# Patient Record
Sex: Male | Born: 1935 | Race: White | Hispanic: No | Marital: Married | Smoking: Former smoker
Health system: Southern US, Community
[De-identification: ages and names within clinical notes are randomized; demographics above are authoritative.]

## PROBLEM LIST (undated history)

## (undated) DIAGNOSIS — I4819 Other persistent atrial fibrillation: Secondary | ICD-10-CM

## (undated) DIAGNOSIS — E079 Disorder of thyroid, unspecified: Secondary | ICD-10-CM

## (undated) DIAGNOSIS — Z95810 Presence of automatic (implantable) cardiac defibrillator: Secondary | ICD-10-CM

## (undated) DIAGNOSIS — I25119 Atherosclerotic heart disease of native coronary artery with unspecified angina pectoris: Secondary | ICD-10-CM

## (undated) DIAGNOSIS — J449 Chronic obstructive pulmonary disease, unspecified: Secondary | ICD-10-CM

## (undated) DIAGNOSIS — I5042 Chronic combined systolic (congestive) and diastolic (congestive) heart failure: Secondary | ICD-10-CM

## (undated) DIAGNOSIS — N183 Chronic kidney disease, stage 3 (moderate): Secondary | ICD-10-CM

## (undated) DIAGNOSIS — I2721 Secondary pulmonary arterial hypertension: Secondary | ICD-10-CM

## (undated) DIAGNOSIS — E785 Hyperlipidemia, unspecified: Secondary | ICD-10-CM

## (undated) DIAGNOSIS — I671 Cerebral aneurysm, nonruptured: Secondary | ICD-10-CM

## (undated) DIAGNOSIS — I350 Nonrheumatic aortic (valve) stenosis: Secondary | ICD-10-CM

## (undated) DIAGNOSIS — I10 Essential (primary) hypertension: Secondary | ICD-10-CM

## (undated) HISTORY — PX: PROSTATE SURGERY: SHX751

## (undated) HISTORY — DX: Hyperlipidemia, unspecified: E78.5

## (undated) HISTORY — PX: CORONARY ANGIOPLASTY WITH STENT PLACEMENT: SHX49

## (undated) HISTORY — DX: Cerebral aneurysm, nonruptured: I67.1

## (undated) HISTORY — PX: APPENDECTOMY: SHX54

## (undated) HISTORY — DX: Chronic obstructive pulmonary disease, unspecified: J44.9

## (undated) HISTORY — PX: SHOULDER SURGERY: SHX246

## (undated) HISTORY — DX: Nonrheumatic aortic (valve) stenosis: I35.0

## (undated) HISTORY — DX: Essential (primary) hypertension: I10

## (undated) HISTORY — PX: CARDIAC SURGERY: SHX584

## (undated) HISTORY — DX: Secondary pulmonary arterial hypertension: I27.21

## (undated) HISTORY — DX: Presence of automatic (implantable) cardiac defibrillator: Z95.810

## (undated) HISTORY — DX: Atherosclerotic heart disease of native coronary artery with unspecified angina pectoris: I25.119

## (undated) HISTORY — DX: Disorder of thyroid, unspecified: E07.9

## (undated) HISTORY — DX: Chronic combined systolic (congestive) and diastolic (congestive) heart failure: I50.42

---

## 1997-02-11 HISTORY — PX: KNEE SURGERY: SHX244

## 2006-02-11 DIAGNOSIS — Z95810 Presence of automatic (implantable) cardiac defibrillator: Secondary | ICD-10-CM | POA: Insufficient documentation

## 2006-02-11 HISTORY — DX: Presence of automatic (implantable) cardiac defibrillator: Z95.810

## 2011-02-12 HISTORY — PX: VEIN BYPASS SURGERY: SHX833

## 2011-02-12 HISTORY — PX: CORONARY ARTERY BYPASS GRAFT: SHX141

## 2012-08-26 DIAGNOSIS — I25709 Atherosclerosis of coronary artery bypass graft(s), unspecified, with unspecified angina pectoris: Secondary | ICD-10-CM

## 2012-09-05 HISTORY — PX: COLONOSCOPY: SHX174

## 2012-09-13 DIAGNOSIS — Z95810 Presence of automatic (implantable) cardiac defibrillator: Secondary | ICD-10-CM | POA: Insufficient documentation

## 2015-02-12 HISTORY — PX: CARPAL TUNNEL RELEASE: SHX101

## 2015-02-28 DIAGNOSIS — I480 Paroxysmal atrial fibrillation: Secondary | ICD-10-CM | POA: Diagnosis not present

## 2015-02-28 DIAGNOSIS — R42 Dizziness and giddiness: Secondary | ICD-10-CM | POA: Diagnosis not present

## 2015-02-28 DIAGNOSIS — E7800 Pure hypercholesterolemia, unspecified: Secondary | ICD-10-CM | POA: Diagnosis not present

## 2015-02-28 DIAGNOSIS — E039 Hypothyroidism, unspecified: Secondary | ICD-10-CM | POA: Diagnosis not present

## 2015-02-28 DIAGNOSIS — I2510 Atherosclerotic heart disease of native coronary artery without angina pectoris: Secondary | ICD-10-CM | POA: Diagnosis not present

## 2015-02-28 DIAGNOSIS — Z7901 Long term (current) use of anticoagulants: Secondary | ICD-10-CM | POA: Diagnosis not present

## 2015-02-28 DIAGNOSIS — R350 Frequency of micturition: Secondary | ICD-10-CM | POA: Diagnosis not present

## 2015-02-28 DIAGNOSIS — R7301 Impaired fasting glucose: Secondary | ICD-10-CM | POA: Diagnosis not present

## 2015-02-28 DIAGNOSIS — R5381 Other malaise: Secondary | ICD-10-CM | POA: Diagnosis not present

## 2015-03-06 DIAGNOSIS — N400 Enlarged prostate without lower urinary tract symptoms: Secondary | ICD-10-CM | POA: Diagnosis not present

## 2015-03-06 DIAGNOSIS — R972 Elevated prostate specific antigen [PSA]: Secondary | ICD-10-CM | POA: Diagnosis not present

## 2015-03-15 DIAGNOSIS — I700 Atherosclerosis of aorta: Secondary | ICD-10-CM | POA: Diagnosis not present

## 2015-03-15 DIAGNOSIS — Z96653 Presence of artificial knee joint, bilateral: Secondary | ICD-10-CM | POA: Diagnosis not present

## 2015-03-15 DIAGNOSIS — K769 Liver disease, unspecified: Secondary | ICD-10-CM | POA: Diagnosis not present

## 2015-03-15 DIAGNOSIS — N281 Cyst of kidney, acquired: Secondary | ICD-10-CM | POA: Diagnosis not present

## 2015-03-15 DIAGNOSIS — I2510 Atherosclerotic heart disease of native coronary artery without angina pectoris: Secondary | ICD-10-CM | POA: Diagnosis not present

## 2015-03-15 DIAGNOSIS — Z8546 Personal history of malignant neoplasm of prostate: Secondary | ICD-10-CM | POA: Diagnosis not present

## 2015-03-15 DIAGNOSIS — Z006 Encounter for examination for normal comparison and control in clinical research program: Secondary | ICD-10-CM | POA: Diagnosis not present

## 2015-03-15 DIAGNOSIS — C61 Malignant neoplasm of prostate: Secondary | ICD-10-CM | POA: Diagnosis not present

## 2015-03-15 DIAGNOSIS — S2241XA Multiple fractures of ribs, right side, initial encounter for closed fracture: Secondary | ICD-10-CM | POA: Diagnosis not present

## 2015-03-15 DIAGNOSIS — E041 Nontoxic single thyroid nodule: Secondary | ICD-10-CM | POA: Diagnosis not present

## 2015-03-15 DIAGNOSIS — Z951 Presence of aortocoronary bypass graft: Secondary | ICD-10-CM | POA: Diagnosis not present

## 2015-03-15 DIAGNOSIS — K8020 Calculus of gallbladder without cholecystitis without obstruction: Secondary | ICD-10-CM | POA: Diagnosis not present

## 2015-03-21 DIAGNOSIS — Z7901 Long term (current) use of anticoagulants: Secondary | ICD-10-CM | POA: Diagnosis not present

## 2015-03-21 DIAGNOSIS — I480 Paroxysmal atrial fibrillation: Secondary | ICD-10-CM | POA: Diagnosis not present

## 2015-04-03 DIAGNOSIS — Z7901 Long term (current) use of anticoagulants: Secondary | ICD-10-CM | POA: Diagnosis not present

## 2015-04-03 DIAGNOSIS — Z95810 Presence of automatic (implantable) cardiac defibrillator: Secondary | ICD-10-CM | POA: Diagnosis not present

## 2015-04-03 DIAGNOSIS — Z951 Presence of aortocoronary bypass graft: Secondary | ICD-10-CM | POA: Diagnosis not present

## 2015-04-03 DIAGNOSIS — R42 Dizziness and giddiness: Secondary | ICD-10-CM | POA: Diagnosis not present

## 2015-04-03 DIAGNOSIS — I480 Paroxysmal atrial fibrillation: Secondary | ICD-10-CM | POA: Diagnosis not present

## 2015-04-05 DIAGNOSIS — R42 Dizziness and giddiness: Secondary | ICD-10-CM | POA: Diagnosis not present

## 2015-04-12 HISTORY — PX: NM MYOVIEW LTD: HXRAD82

## 2015-04-25 DIAGNOSIS — I2510 Atherosclerotic heart disease of native coronary artery without angina pectoris: Secondary | ICD-10-CM | POA: Diagnosis not present

## 2015-04-25 DIAGNOSIS — R0602 Shortness of breath: Secondary | ICD-10-CM | POA: Diagnosis not present

## 2015-04-25 DIAGNOSIS — Z7901 Long term (current) use of anticoagulants: Secondary | ICD-10-CM | POA: Diagnosis not present

## 2015-04-25 DIAGNOSIS — Z951 Presence of aortocoronary bypass graft: Secondary | ICD-10-CM | POA: Diagnosis not present

## 2015-04-25 DIAGNOSIS — I480 Paroxysmal atrial fibrillation: Secondary | ICD-10-CM | POA: Diagnosis not present

## 2015-04-25 DIAGNOSIS — E782 Mixed hyperlipidemia: Secondary | ICD-10-CM | POA: Diagnosis not present

## 2015-04-25 DIAGNOSIS — I482 Chronic atrial fibrillation: Secondary | ICD-10-CM | POA: Diagnosis not present

## 2015-04-25 DIAGNOSIS — Z950 Presence of cardiac pacemaker: Secondary | ICD-10-CM | POA: Diagnosis not present

## 2015-05-03 DIAGNOSIS — D1801 Hemangioma of skin and subcutaneous tissue: Secondary | ICD-10-CM | POA: Diagnosis not present

## 2015-05-03 DIAGNOSIS — L821 Other seborrheic keratosis: Secondary | ICD-10-CM | POA: Diagnosis not present

## 2015-05-03 DIAGNOSIS — C44311 Basal cell carcinoma of skin of nose: Secondary | ICD-10-CM | POA: Diagnosis not present

## 2015-05-03 DIAGNOSIS — D225 Melanocytic nevi of trunk: Secondary | ICD-10-CM | POA: Diagnosis not present

## 2015-05-08 DIAGNOSIS — I2510 Atherosclerotic heart disease of native coronary artery without angina pectoris: Secondary | ICD-10-CM | POA: Diagnosis not present

## 2015-05-08 DIAGNOSIS — R9430 Abnormal result of cardiovascular function study, unspecified: Secondary | ICD-10-CM | POA: Diagnosis not present

## 2015-05-16 DIAGNOSIS — I480 Paroxysmal atrial fibrillation: Secondary | ICD-10-CM | POA: Diagnosis not present

## 2015-05-16 DIAGNOSIS — Z7901 Long term (current) use of anticoagulants: Secondary | ICD-10-CM | POA: Diagnosis not present

## 2015-05-17 DIAGNOSIS — R972 Elevated prostate specific antigen [PSA]: Secondary | ICD-10-CM | POA: Diagnosis not present

## 2015-05-22 DIAGNOSIS — R279 Unspecified lack of coordination: Secondary | ICD-10-CM | POA: Diagnosis not present

## 2015-05-22 DIAGNOSIS — R262 Difficulty in walking, not elsewhere classified: Secondary | ICD-10-CM | POA: Diagnosis not present

## 2015-05-22 DIAGNOSIS — M6281 Muscle weakness (generalized): Secondary | ICD-10-CM | POA: Diagnosis not present

## 2015-05-29 DIAGNOSIS — R972 Elevated prostate specific antigen [PSA]: Secondary | ICD-10-CM | POA: Diagnosis not present

## 2015-05-29 DIAGNOSIS — M6281 Muscle weakness (generalized): Secondary | ICD-10-CM | POA: Diagnosis not present

## 2015-05-29 DIAGNOSIS — R262 Difficulty in walking, not elsewhere classified: Secondary | ICD-10-CM | POA: Diagnosis not present

## 2015-05-29 DIAGNOSIS — R279 Unspecified lack of coordination: Secondary | ICD-10-CM | POA: Diagnosis not present

## 2015-05-29 DIAGNOSIS — C61 Malignant neoplasm of prostate: Secondary | ICD-10-CM | POA: Diagnosis not present

## 2015-05-29 DIAGNOSIS — N400 Enlarged prostate without lower urinary tract symptoms: Secondary | ICD-10-CM | POA: Diagnosis not present

## 2015-06-01 DIAGNOSIS — Z125 Encounter for screening for malignant neoplasm of prostate: Secondary | ICD-10-CM | POA: Diagnosis not present

## 2015-06-01 DIAGNOSIS — Z0000 Encounter for general adult medical examination without abnormal findings: Secondary | ICD-10-CM | POA: Diagnosis not present

## 2015-06-01 DIAGNOSIS — E039 Hypothyroidism, unspecified: Secondary | ICD-10-CM | POA: Diagnosis not present

## 2015-06-01 DIAGNOSIS — E1165 Type 2 diabetes mellitus with hyperglycemia: Secondary | ICD-10-CM | POA: Diagnosis not present

## 2015-06-01 DIAGNOSIS — R279 Unspecified lack of coordination: Secondary | ICD-10-CM | POA: Diagnosis not present

## 2015-06-01 DIAGNOSIS — M6281 Muscle weakness (generalized): Secondary | ICD-10-CM | POA: Diagnosis not present

## 2015-06-01 DIAGNOSIS — R262 Difficulty in walking, not elsewhere classified: Secondary | ICD-10-CM | POA: Diagnosis not present

## 2015-06-05 DIAGNOSIS — R262 Difficulty in walking, not elsewhere classified: Secondary | ICD-10-CM | POA: Diagnosis not present

## 2015-06-05 DIAGNOSIS — M6281 Muscle weakness (generalized): Secondary | ICD-10-CM | POA: Diagnosis not present

## 2015-06-05 DIAGNOSIS — R279 Unspecified lack of coordination: Secondary | ICD-10-CM | POA: Diagnosis not present

## 2015-06-07 DIAGNOSIS — R262 Difficulty in walking, not elsewhere classified: Secondary | ICD-10-CM | POA: Diagnosis not present

## 2015-06-07 DIAGNOSIS — R972 Elevated prostate specific antigen [PSA]: Secondary | ICD-10-CM | POA: Diagnosis not present

## 2015-06-07 DIAGNOSIS — M6281 Muscle weakness (generalized): Secondary | ICD-10-CM | POA: Diagnosis not present

## 2015-06-07 DIAGNOSIS — R279 Unspecified lack of coordination: Secondary | ICD-10-CM | POA: Diagnosis not present

## 2015-06-07 DIAGNOSIS — Z0000 Encounter for general adult medical examination without abnormal findings: Secondary | ICD-10-CM | POA: Diagnosis not present

## 2015-06-12 HISTORY — PX: CARDIAC CATHETERIZATION: SHX172

## 2015-06-14 DIAGNOSIS — I480 Paroxysmal atrial fibrillation: Secondary | ICD-10-CM | POA: Diagnosis not present

## 2015-06-14 DIAGNOSIS — Z951 Presence of aortocoronary bypass graft: Secondary | ICD-10-CM | POA: Diagnosis not present

## 2015-06-14 DIAGNOSIS — I2510 Atherosclerotic heart disease of native coronary artery without angina pectoris: Secondary | ICD-10-CM | POA: Diagnosis not present

## 2015-06-14 DIAGNOSIS — Z7901 Long term (current) use of anticoagulants: Secondary | ICD-10-CM | POA: Diagnosis not present

## 2015-06-15 DIAGNOSIS — R262 Difficulty in walking, not elsewhere classified: Secondary | ICD-10-CM | POA: Diagnosis not present

## 2015-06-15 DIAGNOSIS — M6281 Muscle weakness (generalized): Secondary | ICD-10-CM | POA: Diagnosis not present

## 2015-06-15 DIAGNOSIS — R279 Unspecified lack of coordination: Secondary | ICD-10-CM | POA: Diagnosis not present

## 2015-06-19 DIAGNOSIS — M6281 Muscle weakness (generalized): Secondary | ICD-10-CM | POA: Diagnosis not present

## 2015-06-19 DIAGNOSIS — R279 Unspecified lack of coordination: Secondary | ICD-10-CM | POA: Diagnosis not present

## 2015-06-19 DIAGNOSIS — R262 Difficulty in walking, not elsewhere classified: Secondary | ICD-10-CM | POA: Diagnosis not present

## 2015-06-22 DIAGNOSIS — R279 Unspecified lack of coordination: Secondary | ICD-10-CM | POA: Diagnosis not present

## 2015-06-22 DIAGNOSIS — R262 Difficulty in walking, not elsewhere classified: Secondary | ICD-10-CM | POA: Diagnosis not present

## 2015-06-22 DIAGNOSIS — M6281 Muscle weakness (generalized): Secondary | ICD-10-CM | POA: Diagnosis not present

## 2015-06-26 DIAGNOSIS — G4440 Drug-induced headache, not elsewhere classified, not intractable: Secondary | ICD-10-CM | POA: Diagnosis not present

## 2015-06-26 DIAGNOSIS — I25718 Atherosclerosis of autologous vein coronary artery bypass graft(s) with other forms of angina pectoris: Secondary | ICD-10-CM | POA: Diagnosis not present

## 2015-06-26 DIAGNOSIS — I252 Old myocardial infarction: Secondary | ICD-10-CM | POA: Diagnosis not present

## 2015-06-26 DIAGNOSIS — I509 Heart failure, unspecified: Secondary | ICD-10-CM | POA: Diagnosis not present

## 2015-06-26 DIAGNOSIS — Z888 Allergy status to other drugs, medicaments and biological substances status: Secondary | ICD-10-CM | POA: Diagnosis not present

## 2015-06-26 DIAGNOSIS — Z9049 Acquired absence of other specified parts of digestive tract: Secondary | ICD-10-CM | POA: Diagnosis not present

## 2015-06-26 DIAGNOSIS — M1990 Unspecified osteoarthritis, unspecified site: Secondary | ICD-10-CM | POA: Diagnosis not present

## 2015-06-26 DIAGNOSIS — Z7982 Long term (current) use of aspirin: Secondary | ICD-10-CM | POA: Diagnosis not present

## 2015-06-26 DIAGNOSIS — E782 Mixed hyperlipidemia: Secondary | ICD-10-CM | POA: Diagnosis not present

## 2015-06-26 DIAGNOSIS — Z95810 Presence of automatic (implantable) cardiac defibrillator: Secondary | ICD-10-CM | POA: Diagnosis not present

## 2015-06-26 DIAGNOSIS — I480 Paroxysmal atrial fibrillation: Secondary | ICD-10-CM | POA: Diagnosis not present

## 2015-06-26 DIAGNOSIS — E7800 Pure hypercholesterolemia, unspecified: Secondary | ICD-10-CM | POA: Diagnosis not present

## 2015-06-26 DIAGNOSIS — R0789 Other chest pain: Secondary | ICD-10-CM | POA: Diagnosis not present

## 2015-06-26 DIAGNOSIS — T463X5A Adverse effect of coronary vasodilators, initial encounter: Secondary | ICD-10-CM | POA: Diagnosis not present

## 2015-06-26 DIAGNOSIS — Z87442 Personal history of urinary calculi: Secondary | ICD-10-CM | POA: Diagnosis not present

## 2015-06-26 DIAGNOSIS — Z96653 Presence of artificial knee joint, bilateral: Secondary | ICD-10-CM | POA: Diagnosis not present

## 2015-06-26 DIAGNOSIS — H269 Unspecified cataract: Secondary | ICD-10-CM | POA: Diagnosis not present

## 2015-06-26 DIAGNOSIS — I110 Hypertensive heart disease with heart failure: Secondary | ICD-10-CM | POA: Diagnosis not present

## 2015-06-26 DIAGNOSIS — Z7901 Long term (current) use of anticoagulants: Secondary | ICD-10-CM | POA: Diagnosis not present

## 2015-06-26 DIAGNOSIS — I959 Hypotension, unspecified: Secondary | ICD-10-CM | POA: Diagnosis not present

## 2015-06-26 DIAGNOSIS — R0602 Shortness of breath: Secondary | ICD-10-CM | POA: Diagnosis not present

## 2015-06-26 DIAGNOSIS — I25118 Atherosclerotic heart disease of native coronary artery with other forms of angina pectoris: Secondary | ICD-10-CM | POA: Diagnosis not present

## 2015-06-26 DIAGNOSIS — Z79899 Other long term (current) drug therapy: Secondary | ICD-10-CM | POA: Diagnosis not present

## 2015-06-26 DIAGNOSIS — Z9852 Vasectomy status: Secondary | ICD-10-CM | POA: Diagnosis not present

## 2015-06-26 DIAGNOSIS — I2582 Chronic total occlusion of coronary artery: Secondary | ICD-10-CM | POA: Diagnosis not present

## 2015-06-30 DIAGNOSIS — R279 Unspecified lack of coordination: Secondary | ICD-10-CM | POA: Diagnosis not present

## 2015-06-30 DIAGNOSIS — M6281 Muscle weakness (generalized): Secondary | ICD-10-CM | POA: Diagnosis not present

## 2015-06-30 DIAGNOSIS — R262 Difficulty in walking, not elsewhere classified: Secondary | ICD-10-CM | POA: Diagnosis not present

## 2015-07-03 DIAGNOSIS — C61 Malignant neoplasm of prostate: Secondary | ICD-10-CM | POA: Diagnosis not present

## 2015-07-12 DIAGNOSIS — R262 Difficulty in walking, not elsewhere classified: Secondary | ICD-10-CM | POA: Diagnosis not present

## 2015-07-12 DIAGNOSIS — M6281 Muscle weakness (generalized): Secondary | ICD-10-CM | POA: Diagnosis not present

## 2015-07-12 DIAGNOSIS — R279 Unspecified lack of coordination: Secondary | ICD-10-CM | POA: Diagnosis not present

## 2015-07-12 DIAGNOSIS — C61 Malignant neoplasm of prostate: Secondary | ICD-10-CM | POA: Diagnosis not present

## 2015-07-12 DIAGNOSIS — N400 Enlarged prostate without lower urinary tract symptoms: Secondary | ICD-10-CM | POA: Diagnosis not present

## 2015-07-14 DIAGNOSIS — R262 Difficulty in walking, not elsewhere classified: Secondary | ICD-10-CM | POA: Diagnosis not present

## 2015-07-14 DIAGNOSIS — R279 Unspecified lack of coordination: Secondary | ICD-10-CM | POA: Diagnosis not present

## 2015-07-14 DIAGNOSIS — M6281 Muscle weakness (generalized): Secondary | ICD-10-CM | POA: Diagnosis not present

## 2015-08-04 DIAGNOSIS — H1131 Conjunctival hemorrhage, right eye: Secondary | ICD-10-CM | POA: Diagnosis not present

## 2015-08-04 DIAGNOSIS — H01029 Squamous blepharitis unspecified eye, unspecified eyelid: Secondary | ICD-10-CM | POA: Diagnosis not present

## 2015-08-04 DIAGNOSIS — Z961 Presence of intraocular lens: Secondary | ICD-10-CM | POA: Diagnosis not present

## 2015-08-07 DIAGNOSIS — I2510 Atherosclerotic heart disease of native coronary artery without angina pectoris: Secondary | ICD-10-CM | POA: Diagnosis not present

## 2015-08-07 DIAGNOSIS — Z125 Encounter for screening for malignant neoplasm of prostate: Secondary | ICD-10-CM | POA: Diagnosis not present

## 2015-08-07 DIAGNOSIS — Z111 Encounter for screening for respiratory tuberculosis: Secondary | ICD-10-CM | POA: Diagnosis not present

## 2015-08-07 DIAGNOSIS — E1165 Type 2 diabetes mellitus with hyperglycemia: Secondary | ICD-10-CM | POA: Diagnosis not present

## 2015-08-07 DIAGNOSIS — R7301 Impaired fasting glucose: Secondary | ICD-10-CM | POA: Diagnosis not present

## 2015-08-12 HISTORY — PX: TRANSTHORACIC ECHOCARDIOGRAM: SHX275

## 2015-08-13 DIAGNOSIS — Z95810 Presence of automatic (implantable) cardiac defibrillator: Secondary | ICD-10-CM | POA: Diagnosis not present

## 2015-08-13 DIAGNOSIS — Z87891 Personal history of nicotine dependence: Secondary | ICD-10-CM | POA: Diagnosis not present

## 2015-08-13 DIAGNOSIS — I110 Hypertensive heart disease with heart failure: Secondary | ICD-10-CM | POA: Diagnosis not present

## 2015-08-13 DIAGNOSIS — J90 Pleural effusion, not elsewhere classified: Secondary | ICD-10-CM | POA: Diagnosis not present

## 2015-08-13 DIAGNOSIS — Z87442 Personal history of urinary calculi: Secondary | ICD-10-CM | POA: Diagnosis not present

## 2015-08-13 DIAGNOSIS — Z888 Allergy status to other drugs, medicaments and biological substances status: Secondary | ICD-10-CM | POA: Diagnosis not present

## 2015-08-13 DIAGNOSIS — R5381 Other malaise: Secondary | ICD-10-CM | POA: Diagnosis present

## 2015-08-13 DIAGNOSIS — I5023 Acute on chronic systolic (congestive) heart failure: Secondary | ICD-10-CM | POA: Diagnosis present

## 2015-08-13 DIAGNOSIS — Z96653 Presence of artificial knee joint, bilateral: Secondary | ICD-10-CM | POA: Diagnosis present

## 2015-08-13 DIAGNOSIS — I252 Old myocardial infarction: Secondary | ICD-10-CM | POA: Diagnosis not present

## 2015-08-13 DIAGNOSIS — N179 Acute kidney failure, unspecified: Secondary | ICD-10-CM | POA: Diagnosis present

## 2015-08-13 DIAGNOSIS — I480 Paroxysmal atrial fibrillation: Secondary | ICD-10-CM | POA: Diagnosis present

## 2015-08-13 DIAGNOSIS — Z7982 Long term (current) use of aspirin: Secondary | ICD-10-CM | POA: Diagnosis not present

## 2015-08-13 DIAGNOSIS — I130 Hypertensive heart and chronic kidney disease with heart failure and stage 1 through stage 4 chronic kidney disease, or unspecified chronic kidney disease: Secondary | ICD-10-CM | POA: Diagnosis not present

## 2015-08-13 DIAGNOSIS — N183 Chronic kidney disease, stage 3 (moderate): Secondary | ICD-10-CM | POA: Diagnosis present

## 2015-08-13 DIAGNOSIS — I517 Cardiomegaly: Secondary | ICD-10-CM | POA: Diagnosis not present

## 2015-08-13 DIAGNOSIS — M7989 Other specified soft tissue disorders: Secondary | ICD-10-CM | POA: Diagnosis not present

## 2015-08-13 DIAGNOSIS — J449 Chronic obstructive pulmonary disease, unspecified: Secondary | ICD-10-CM | POA: Diagnosis present

## 2015-08-13 DIAGNOSIS — Z951 Presence of aortocoronary bypass graft: Secondary | ICD-10-CM | POA: Diagnosis not present

## 2015-08-13 DIAGNOSIS — Z79899 Other long term (current) drug therapy: Secondary | ICD-10-CM | POA: Diagnosis not present

## 2015-08-13 DIAGNOSIS — R0902 Hypoxemia: Secondary | ICD-10-CM | POA: Diagnosis present

## 2015-08-13 DIAGNOSIS — I2510 Atherosclerotic heart disease of native coronary artery without angina pectoris: Secondary | ICD-10-CM | POA: Diagnosis present

## 2015-08-13 DIAGNOSIS — T45515A Adverse effect of anticoagulants, initial encounter: Secondary | ICD-10-CM | POA: Diagnosis present

## 2015-08-13 DIAGNOSIS — E785 Hyperlipidemia, unspecified: Secondary | ICD-10-CM | POA: Diagnosis present

## 2015-08-13 DIAGNOSIS — I340 Nonrheumatic mitral (valve) insufficiency: Secondary | ICD-10-CM | POA: Diagnosis not present

## 2015-08-13 DIAGNOSIS — I361 Nonrheumatic tricuspid (valve) insufficiency: Secondary | ICD-10-CM | POA: Diagnosis not present

## 2015-08-13 DIAGNOSIS — I509 Heart failure, unspecified: Secondary | ICD-10-CM | POA: Diagnosis not present

## 2015-08-13 DIAGNOSIS — I358 Other nonrheumatic aortic valve disorders: Secondary | ICD-10-CM | POA: Diagnosis not present

## 2015-08-13 DIAGNOSIS — I255 Ischemic cardiomyopathy: Secondary | ICD-10-CM | POA: Diagnosis not present

## 2015-08-13 DIAGNOSIS — R0602 Shortness of breath: Secondary | ICD-10-CM | POA: Diagnosis not present

## 2015-08-18 DIAGNOSIS — Z951 Presence of aortocoronary bypass graft: Secondary | ICD-10-CM | POA: Diagnosis not present

## 2015-08-18 DIAGNOSIS — I480 Paroxysmal atrial fibrillation: Secondary | ICD-10-CM | POA: Diagnosis not present

## 2015-08-18 DIAGNOSIS — Z7901 Long term (current) use of anticoagulants: Secondary | ICD-10-CM | POA: Diagnosis not present

## 2015-08-18 DIAGNOSIS — I5023 Acute on chronic systolic (congestive) heart failure: Secondary | ICD-10-CM | POA: Diagnosis not present

## 2015-08-18 DIAGNOSIS — Z95810 Presence of automatic (implantable) cardiac defibrillator: Secondary | ICD-10-CM | POA: Diagnosis not present

## 2015-08-23 DIAGNOSIS — I509 Heart failure, unspecified: Secondary | ICD-10-CM | POA: Diagnosis not present

## 2015-08-23 DIAGNOSIS — J4530 Mild persistent asthma, uncomplicated: Secondary | ICD-10-CM | POA: Diagnosis not present

## 2015-08-25 DIAGNOSIS — I480 Paroxysmal atrial fibrillation: Secondary | ICD-10-CM | POA: Diagnosis not present

## 2015-08-25 DIAGNOSIS — Z7901 Long term (current) use of anticoagulants: Secondary | ICD-10-CM | POA: Diagnosis not present

## 2015-09-01 DIAGNOSIS — C4490 Unspecified malignant neoplasm of skin, unspecified: Secondary | ICD-10-CM | POA: Diagnosis not present

## 2015-09-01 DIAGNOSIS — J45909 Unspecified asthma, uncomplicated: Secondary | ICD-10-CM | POA: Diagnosis not present

## 2015-09-01 DIAGNOSIS — I509 Heart failure, unspecified: Secondary | ICD-10-CM | POA: Diagnosis not present

## 2015-09-01 DIAGNOSIS — M6281 Muscle weakness (generalized): Secondary | ICD-10-CM | POA: Diagnosis not present

## 2015-09-01 DIAGNOSIS — Z950 Presence of cardiac pacemaker: Secondary | ICD-10-CM | POA: Diagnosis not present

## 2015-09-01 DIAGNOSIS — N189 Chronic kidney disease, unspecified: Secondary | ICD-10-CM | POA: Diagnosis not present

## 2015-09-01 DIAGNOSIS — Z7982 Long term (current) use of aspirin: Secondary | ICD-10-CM | POA: Diagnosis not present

## 2015-09-01 DIAGNOSIS — Z86718 Personal history of other venous thrombosis and embolism: Secondary | ICD-10-CM | POA: Diagnosis not present

## 2015-09-01 DIAGNOSIS — I2510 Atherosclerotic heart disease of native coronary artery without angina pectoris: Secondary | ICD-10-CM | POA: Diagnosis not present

## 2015-09-01 DIAGNOSIS — Z951 Presence of aortocoronary bypass graft: Secondary | ICD-10-CM | POA: Diagnosis not present

## 2015-09-01 DIAGNOSIS — R2689 Other abnormalities of gait and mobility: Secondary | ICD-10-CM | POA: Diagnosis not present

## 2015-09-01 DIAGNOSIS — Z7901 Long term (current) use of anticoagulants: Secondary | ICD-10-CM | POA: Diagnosis not present

## 2015-09-01 DIAGNOSIS — I4891 Unspecified atrial fibrillation: Secondary | ICD-10-CM | POA: Diagnosis not present

## 2015-09-01 DIAGNOSIS — E119 Type 2 diabetes mellitus without complications: Secondary | ICD-10-CM | POA: Diagnosis not present

## 2015-09-01 DIAGNOSIS — Z9181 History of falling: Secondary | ICD-10-CM | POA: Diagnosis not present

## 2015-09-05 DIAGNOSIS — C4490 Unspecified malignant neoplasm of skin, unspecified: Secondary | ICD-10-CM | POA: Diagnosis not present

## 2015-09-05 DIAGNOSIS — I509 Heart failure, unspecified: Secondary | ICD-10-CM | POA: Diagnosis not present

## 2015-09-05 DIAGNOSIS — E119 Type 2 diabetes mellitus without complications: Secondary | ICD-10-CM | POA: Diagnosis not present

## 2015-09-05 DIAGNOSIS — I4891 Unspecified atrial fibrillation: Secondary | ICD-10-CM | POA: Diagnosis not present

## 2015-09-05 DIAGNOSIS — R2689 Other abnormalities of gait and mobility: Secondary | ICD-10-CM | POA: Diagnosis not present

## 2015-09-05 DIAGNOSIS — M6281 Muscle weakness (generalized): Secondary | ICD-10-CM | POA: Diagnosis not present

## 2015-09-07 DIAGNOSIS — M6281 Muscle weakness (generalized): Secondary | ICD-10-CM | POA: Diagnosis not present

## 2015-09-07 DIAGNOSIS — C4490 Unspecified malignant neoplasm of skin, unspecified: Secondary | ICD-10-CM | POA: Diagnosis not present

## 2015-09-07 DIAGNOSIS — R2689 Other abnormalities of gait and mobility: Secondary | ICD-10-CM | POA: Diagnosis not present

## 2015-09-07 DIAGNOSIS — I509 Heart failure, unspecified: Secondary | ICD-10-CM | POA: Diagnosis not present

## 2015-09-07 DIAGNOSIS — I4891 Unspecified atrial fibrillation: Secondary | ICD-10-CM | POA: Diagnosis not present

## 2015-09-07 DIAGNOSIS — E119 Type 2 diabetes mellitus without complications: Secondary | ICD-10-CM | POA: Diagnosis not present

## 2015-09-11 DIAGNOSIS — E785 Hyperlipidemia, unspecified: Secondary | ICD-10-CM | POA: Diagnosis not present

## 2015-09-11 DIAGNOSIS — R54 Age-related physical debility: Secondary | ICD-10-CM | POA: Diagnosis not present

## 2015-09-11 DIAGNOSIS — I4891 Unspecified atrial fibrillation: Secondary | ICD-10-CM | POA: Diagnosis not present

## 2015-09-11 DIAGNOSIS — K219 Gastro-esophageal reflux disease without esophagitis: Secondary | ICD-10-CM | POA: Diagnosis not present

## 2015-09-11 DIAGNOSIS — E039 Hypothyroidism, unspecified: Secondary | ICD-10-CM | POA: Diagnosis not present

## 2015-09-11 DIAGNOSIS — N189 Chronic kidney disease, unspecified: Secondary | ICD-10-CM | POA: Diagnosis not present

## 2015-09-11 DIAGNOSIS — R269 Unspecified abnormalities of gait and mobility: Secondary | ICD-10-CM | POA: Diagnosis not present

## 2015-09-11 DIAGNOSIS — I2510 Atherosclerotic heart disease of native coronary artery without angina pectoris: Secondary | ICD-10-CM | POA: Diagnosis not present

## 2015-09-13 DIAGNOSIS — Z7901 Long term (current) use of anticoagulants: Secondary | ICD-10-CM | POA: Diagnosis not present

## 2015-09-13 DIAGNOSIS — E119 Type 2 diabetes mellitus without complications: Secondary | ICD-10-CM | POA: Diagnosis not present

## 2015-09-13 DIAGNOSIS — R2689 Other abnormalities of gait and mobility: Secondary | ICD-10-CM | POA: Diagnosis not present

## 2015-09-13 DIAGNOSIS — M6281 Muscle weakness (generalized): Secondary | ICD-10-CM | POA: Diagnosis not present

## 2015-09-13 DIAGNOSIS — I4891 Unspecified atrial fibrillation: Secondary | ICD-10-CM | POA: Diagnosis not present

## 2015-09-13 DIAGNOSIS — I509 Heart failure, unspecified: Secondary | ICD-10-CM | POA: Diagnosis not present

## 2015-09-13 DIAGNOSIS — Z79899 Other long term (current) drug therapy: Secondary | ICD-10-CM | POA: Diagnosis not present

## 2015-09-13 DIAGNOSIS — C4490 Unspecified malignant neoplasm of skin, unspecified: Secondary | ICD-10-CM | POA: Diagnosis not present

## 2015-09-15 DIAGNOSIS — R2689 Other abnormalities of gait and mobility: Secondary | ICD-10-CM | POA: Diagnosis not present

## 2015-09-15 DIAGNOSIS — C4490 Unspecified malignant neoplasm of skin, unspecified: Secondary | ICD-10-CM | POA: Diagnosis not present

## 2015-09-15 DIAGNOSIS — I4891 Unspecified atrial fibrillation: Secondary | ICD-10-CM | POA: Diagnosis not present

## 2015-09-15 DIAGNOSIS — I509 Heart failure, unspecified: Secondary | ICD-10-CM | POA: Diagnosis not present

## 2015-09-15 DIAGNOSIS — E119 Type 2 diabetes mellitus without complications: Secondary | ICD-10-CM | POA: Diagnosis not present

## 2015-09-15 DIAGNOSIS — M6281 Muscle weakness (generalized): Secondary | ICD-10-CM | POA: Diagnosis not present

## 2015-09-18 DIAGNOSIS — E119 Type 2 diabetes mellitus without complications: Secondary | ICD-10-CM | POA: Diagnosis not present

## 2015-09-18 DIAGNOSIS — C4490 Unspecified malignant neoplasm of skin, unspecified: Secondary | ICD-10-CM | POA: Diagnosis not present

## 2015-09-18 DIAGNOSIS — I509 Heart failure, unspecified: Secondary | ICD-10-CM | POA: Diagnosis not present

## 2015-09-18 DIAGNOSIS — M6281 Muscle weakness (generalized): Secondary | ICD-10-CM | POA: Diagnosis not present

## 2015-09-18 DIAGNOSIS — I4891 Unspecified atrial fibrillation: Secondary | ICD-10-CM | POA: Diagnosis not present

## 2015-09-18 DIAGNOSIS — R2689 Other abnormalities of gait and mobility: Secondary | ICD-10-CM | POA: Diagnosis not present

## 2015-09-22 DIAGNOSIS — I4891 Unspecified atrial fibrillation: Secondary | ICD-10-CM | POA: Diagnosis not present

## 2015-09-22 DIAGNOSIS — M6281 Muscle weakness (generalized): Secondary | ICD-10-CM | POA: Diagnosis not present

## 2015-09-22 DIAGNOSIS — C4490 Unspecified malignant neoplasm of skin, unspecified: Secondary | ICD-10-CM | POA: Diagnosis not present

## 2015-09-22 DIAGNOSIS — I509 Heart failure, unspecified: Secondary | ICD-10-CM | POA: Diagnosis not present

## 2015-09-22 DIAGNOSIS — R2689 Other abnormalities of gait and mobility: Secondary | ICD-10-CM | POA: Diagnosis not present

## 2015-09-22 DIAGNOSIS — E119 Type 2 diabetes mellitus without complications: Secondary | ICD-10-CM | POA: Diagnosis not present

## 2015-09-25 DIAGNOSIS — R2689 Other abnormalities of gait and mobility: Secondary | ICD-10-CM | POA: Diagnosis not present

## 2015-09-25 DIAGNOSIS — I509 Heart failure, unspecified: Secondary | ICD-10-CM | POA: Diagnosis not present

## 2015-09-25 DIAGNOSIS — E119 Type 2 diabetes mellitus without complications: Secondary | ICD-10-CM | POA: Diagnosis not present

## 2015-09-25 DIAGNOSIS — C4490 Unspecified malignant neoplasm of skin, unspecified: Secondary | ICD-10-CM | POA: Diagnosis not present

## 2015-09-25 DIAGNOSIS — M6281 Muscle weakness (generalized): Secondary | ICD-10-CM | POA: Diagnosis not present

## 2015-09-25 DIAGNOSIS — I4891 Unspecified atrial fibrillation: Secondary | ICD-10-CM | POA: Diagnosis not present

## 2015-09-29 DIAGNOSIS — E119 Type 2 diabetes mellitus without complications: Secondary | ICD-10-CM | POA: Diagnosis not present

## 2015-09-29 DIAGNOSIS — R2689 Other abnormalities of gait and mobility: Secondary | ICD-10-CM | POA: Diagnosis not present

## 2015-09-29 DIAGNOSIS — I509 Heart failure, unspecified: Secondary | ICD-10-CM | POA: Diagnosis not present

## 2015-09-29 DIAGNOSIS — C4490 Unspecified malignant neoplasm of skin, unspecified: Secondary | ICD-10-CM | POA: Diagnosis not present

## 2015-09-29 DIAGNOSIS — M6281 Muscle weakness (generalized): Secondary | ICD-10-CM | POA: Diagnosis not present

## 2015-09-29 DIAGNOSIS — I4891 Unspecified atrial fibrillation: Secondary | ICD-10-CM | POA: Diagnosis not present

## 2015-10-03 DIAGNOSIS — M6281 Muscle weakness (generalized): Secondary | ICD-10-CM | POA: Diagnosis not present

## 2015-10-03 DIAGNOSIS — I509 Heart failure, unspecified: Secondary | ICD-10-CM | POA: Diagnosis not present

## 2015-10-03 DIAGNOSIS — E119 Type 2 diabetes mellitus without complications: Secondary | ICD-10-CM | POA: Diagnosis not present

## 2015-10-03 DIAGNOSIS — R2689 Other abnormalities of gait and mobility: Secondary | ICD-10-CM | POA: Diagnosis not present

## 2015-10-03 DIAGNOSIS — C4490 Unspecified malignant neoplasm of skin, unspecified: Secondary | ICD-10-CM | POA: Diagnosis not present

## 2015-10-03 DIAGNOSIS — I4891 Unspecified atrial fibrillation: Secondary | ICD-10-CM | POA: Diagnosis not present

## 2015-10-06 DIAGNOSIS — Z7901 Long term (current) use of anticoagulants: Secondary | ICD-10-CM | POA: Diagnosis not present

## 2015-10-09 DIAGNOSIS — R2689 Other abnormalities of gait and mobility: Secondary | ICD-10-CM | POA: Diagnosis not present

## 2015-10-09 DIAGNOSIS — R972 Elevated prostate specific antigen [PSA]: Secondary | ICD-10-CM | POA: Diagnosis not present

## 2015-10-09 DIAGNOSIS — E119 Type 2 diabetes mellitus without complications: Secondary | ICD-10-CM | POA: Diagnosis not present

## 2015-10-09 DIAGNOSIS — Z79899 Other long term (current) drug therapy: Secondary | ICD-10-CM | POA: Diagnosis not present

## 2015-10-09 DIAGNOSIS — I509 Heart failure, unspecified: Secondary | ICD-10-CM | POA: Diagnosis not present

## 2015-10-09 DIAGNOSIS — I2510 Atherosclerotic heart disease of native coronary artery without angina pectoris: Secondary | ICD-10-CM | POA: Diagnosis not present

## 2015-10-09 DIAGNOSIS — C4490 Unspecified malignant neoplasm of skin, unspecified: Secondary | ICD-10-CM | POA: Diagnosis not present

## 2015-10-09 DIAGNOSIS — I4891 Unspecified atrial fibrillation: Secondary | ICD-10-CM | POA: Diagnosis not present

## 2015-10-09 DIAGNOSIS — M6281 Muscle weakness (generalized): Secondary | ICD-10-CM | POA: Diagnosis not present

## 2015-10-09 DIAGNOSIS — R54 Age-related physical debility: Secondary | ICD-10-CM | POA: Diagnosis not present

## 2015-10-11 DIAGNOSIS — R54 Age-related physical debility: Secondary | ICD-10-CM | POA: Diagnosis not present

## 2015-10-11 DIAGNOSIS — N189 Chronic kidney disease, unspecified: Secondary | ICD-10-CM | POA: Diagnosis not present

## 2015-10-11 DIAGNOSIS — I509 Heart failure, unspecified: Secondary | ICD-10-CM | POA: Diagnosis not present

## 2015-10-11 DIAGNOSIS — R269 Unspecified abnormalities of gait and mobility: Secondary | ICD-10-CM | POA: Diagnosis not present

## 2015-10-11 DIAGNOSIS — Z79899 Other long term (current) drug therapy: Secondary | ICD-10-CM | POA: Diagnosis not present

## 2015-10-11 DIAGNOSIS — Z7901 Long term (current) use of anticoagulants: Secondary | ICD-10-CM | POA: Diagnosis not present

## 2015-10-12 DIAGNOSIS — N400 Enlarged prostate without lower urinary tract symptoms: Secondary | ICD-10-CM | POA: Diagnosis not present

## 2015-10-12 DIAGNOSIS — C61 Malignant neoplasm of prostate: Secondary | ICD-10-CM | POA: Diagnosis not present

## 2015-11-01 DIAGNOSIS — Z7901 Long term (current) use of anticoagulants: Secondary | ICD-10-CM | POA: Diagnosis not present

## 2015-11-09 DIAGNOSIS — R54 Age-related physical debility: Secondary | ICD-10-CM | POA: Diagnosis not present

## 2015-11-09 DIAGNOSIS — R269 Unspecified abnormalities of gait and mobility: Secondary | ICD-10-CM | POA: Diagnosis not present

## 2015-11-09 DIAGNOSIS — I509 Heart failure, unspecified: Secondary | ICD-10-CM | POA: Diagnosis not present

## 2015-11-09 DIAGNOSIS — N189 Chronic kidney disease, unspecified: Secondary | ICD-10-CM | POA: Diagnosis not present

## 2015-11-09 DIAGNOSIS — Z7901 Long term (current) use of anticoagulants: Secondary | ICD-10-CM | POA: Diagnosis not present

## 2015-11-09 DIAGNOSIS — K219 Gastro-esophageal reflux disease without esophagitis: Secondary | ICD-10-CM | POA: Diagnosis not present

## 2015-11-09 DIAGNOSIS — Z79899 Other long term (current) drug therapy: Secondary | ICD-10-CM | POA: Diagnosis not present

## 2015-11-13 DIAGNOSIS — I2510 Atherosclerotic heart disease of native coronary artery without angina pectoris: Secondary | ICD-10-CM | POA: Diagnosis not present

## 2015-11-13 DIAGNOSIS — R54 Age-related physical debility: Secondary | ICD-10-CM | POA: Diagnosis not present

## 2015-11-13 DIAGNOSIS — I509 Heart failure, unspecified: Secondary | ICD-10-CM | POA: Diagnosis not present

## 2015-11-13 DIAGNOSIS — I4891 Unspecified atrial fibrillation: Secondary | ICD-10-CM | POA: Diagnosis not present

## 2015-11-13 DIAGNOSIS — Z79899 Other long term (current) drug therapy: Secondary | ICD-10-CM | POA: Diagnosis not present

## 2015-11-15 DIAGNOSIS — Z95810 Presence of automatic (implantable) cardiac defibrillator: Secondary | ICD-10-CM | POA: Diagnosis not present

## 2015-11-15 DIAGNOSIS — I480 Paroxysmal atrial fibrillation: Secondary | ICD-10-CM | POA: Diagnosis not present

## 2015-11-16 DIAGNOSIS — N189 Chronic kidney disease, unspecified: Secondary | ICD-10-CM | POA: Diagnosis not present

## 2015-11-16 DIAGNOSIS — I509 Heart failure, unspecified: Secondary | ICD-10-CM | POA: Diagnosis not present

## 2015-11-16 DIAGNOSIS — R54 Age-related physical debility: Secondary | ICD-10-CM | POA: Diagnosis not present

## 2015-11-16 DIAGNOSIS — Z7901 Long term (current) use of anticoagulants: Secondary | ICD-10-CM | POA: Diagnosis not present

## 2015-11-16 DIAGNOSIS — Z79899 Other long term (current) drug therapy: Secondary | ICD-10-CM | POA: Diagnosis not present

## 2015-11-16 DIAGNOSIS — R269 Unspecified abnormalities of gait and mobility: Secondary | ICD-10-CM | POA: Diagnosis not present

## 2015-12-12 DIAGNOSIS — M9901 Segmental and somatic dysfunction of cervical region: Secondary | ICD-10-CM | POA: Diagnosis not present

## 2015-12-12 DIAGNOSIS — M791 Myalgia: Secondary | ICD-10-CM | POA: Diagnosis not present

## 2015-12-12 DIAGNOSIS — M9902 Segmental and somatic dysfunction of thoracic region: Secondary | ICD-10-CM | POA: Diagnosis not present

## 2015-12-12 DIAGNOSIS — M9903 Segmental and somatic dysfunction of lumbar region: Secondary | ICD-10-CM | POA: Diagnosis not present

## 2015-12-13 DIAGNOSIS — I25810 Atherosclerosis of coronary artery bypass graft(s) without angina pectoris: Secondary | ICD-10-CM | POA: Diagnosis not present

## 2015-12-13 DIAGNOSIS — I480 Paroxysmal atrial fibrillation: Secondary | ICD-10-CM | POA: Diagnosis not present

## 2015-12-13 DIAGNOSIS — H6123 Impacted cerumen, bilateral: Secondary | ICD-10-CM | POA: Diagnosis not present

## 2015-12-19 DIAGNOSIS — H6120 Impacted cerumen, unspecified ear: Secondary | ICD-10-CM | POA: Diagnosis not present

## 2015-12-19 DIAGNOSIS — I480 Paroxysmal atrial fibrillation: Secondary | ICD-10-CM | POA: Diagnosis not present

## 2015-12-19 DIAGNOSIS — Z951 Presence of aortocoronary bypass graft: Secondary | ICD-10-CM | POA: Diagnosis not present

## 2015-12-19 DIAGNOSIS — Z7901 Long term (current) use of anticoagulants: Secondary | ICD-10-CM | POA: Diagnosis not present

## 2015-12-19 DIAGNOSIS — I25810 Atherosclerosis of coronary artery bypass graft(s) without angina pectoris: Secondary | ICD-10-CM | POA: Diagnosis not present

## 2015-12-22 DIAGNOSIS — R238 Other skin changes: Secondary | ICD-10-CM | POA: Diagnosis not present

## 2015-12-22 DIAGNOSIS — I480 Paroxysmal atrial fibrillation: Secondary | ICD-10-CM | POA: Diagnosis not present

## 2015-12-22 DIAGNOSIS — I5022 Chronic systolic (congestive) heart failure: Secondary | ICD-10-CM | POA: Diagnosis not present

## 2015-12-22 DIAGNOSIS — I25810 Atherosclerosis of coronary artery bypass graft(s) without angina pectoris: Secondary | ICD-10-CM | POA: Diagnosis not present

## 2015-12-22 DIAGNOSIS — Z951 Presence of aortocoronary bypass graft: Secondary | ICD-10-CM | POA: Diagnosis not present

## 2015-12-22 DIAGNOSIS — Z7901 Long term (current) use of anticoagulants: Secondary | ICD-10-CM | POA: Diagnosis not present

## 2015-12-22 DIAGNOSIS — I2510 Atherosclerotic heart disease of native coronary artery without angina pectoris: Secondary | ICD-10-CM | POA: Diagnosis not present

## 2016-01-09 DIAGNOSIS — Z7901 Long term (current) use of anticoagulants: Secondary | ICD-10-CM | POA: Diagnosis not present

## 2016-01-09 DIAGNOSIS — I25810 Atherosclerosis of coronary artery bypass graft(s) without angina pectoris: Secondary | ICD-10-CM | POA: Diagnosis not present

## 2016-01-09 DIAGNOSIS — I480 Paroxysmal atrial fibrillation: Secondary | ICD-10-CM | POA: Diagnosis not present

## 2016-01-09 DIAGNOSIS — Z951 Presence of aortocoronary bypass graft: Secondary | ICD-10-CM | POA: Diagnosis not present

## 2016-02-14 DIAGNOSIS — M6281 Muscle weakness (generalized): Secondary | ICD-10-CM | POA: Diagnosis not present

## 2016-02-14 DIAGNOSIS — Z951 Presence of aortocoronary bypass graft: Secondary | ICD-10-CM | POA: Diagnosis not present

## 2016-02-14 DIAGNOSIS — I480 Paroxysmal atrial fibrillation: Secondary | ICD-10-CM | POA: Diagnosis not present

## 2016-02-14 DIAGNOSIS — Z79899 Other long term (current) drug therapy: Secondary | ICD-10-CM | POA: Diagnosis not present

## 2016-02-14 DIAGNOSIS — I5033 Acute on chronic diastolic (congestive) heart failure: Secondary | ICD-10-CM | POA: Diagnosis not present

## 2016-02-14 DIAGNOSIS — Z7901 Long term (current) use of anticoagulants: Secondary | ICD-10-CM | POA: Diagnosis not present

## 2016-02-14 DIAGNOSIS — Z125 Encounter for screening for malignant neoplasm of prostate: Secondary | ICD-10-CM | POA: Diagnosis not present

## 2016-02-14 DIAGNOSIS — R0602 Shortness of breath: Secondary | ICD-10-CM | POA: Diagnosis not present

## 2016-02-14 DIAGNOSIS — N183 Chronic kidney disease, stage 3 (moderate): Secondary | ICD-10-CM | POA: Diagnosis not present

## 2016-02-14 DIAGNOSIS — Z95810 Presence of automatic (implantable) cardiac defibrillator: Secondary | ICD-10-CM | POA: Diagnosis not present

## 2016-02-19 DIAGNOSIS — C61 Malignant neoplasm of prostate: Secondary | ICD-10-CM | POA: Diagnosis not present

## 2016-02-19 DIAGNOSIS — N400 Enlarged prostate without lower urinary tract symptoms: Secondary | ICD-10-CM | POA: Diagnosis not present

## 2016-03-11 DIAGNOSIS — I252 Old myocardial infarction: Secondary | ICD-10-CM | POA: Diagnosis not present

## 2016-03-11 DIAGNOSIS — R0602 Shortness of breath: Secondary | ICD-10-CM | POA: Diagnosis not present

## 2016-03-11 DIAGNOSIS — R918 Other nonspecific abnormal finding of lung field: Secondary | ICD-10-CM | POA: Diagnosis not present

## 2016-03-11 DIAGNOSIS — R42 Dizziness and giddiness: Secondary | ICD-10-CM | POA: Diagnosis not present

## 2016-03-11 DIAGNOSIS — I480 Paroxysmal atrial fibrillation: Secondary | ICD-10-CM | POA: Diagnosis not present

## 2016-03-11 DIAGNOSIS — I509 Heart failure, unspecified: Secondary | ICD-10-CM | POA: Diagnosis not present

## 2016-03-11 DIAGNOSIS — I110 Hypertensive heart disease with heart failure: Secondary | ICD-10-CM | POA: Diagnosis not present

## 2016-03-11 DIAGNOSIS — I2510 Atherosclerotic heart disease of native coronary artery without angina pectoris: Secondary | ICD-10-CM | POA: Diagnosis not present

## 2016-03-11 DIAGNOSIS — J90 Pleural effusion, not elsewhere classified: Secondary | ICD-10-CM | POA: Diagnosis not present

## 2016-03-11 DIAGNOSIS — I5022 Chronic systolic (congestive) heart failure: Secondary | ICD-10-CM | POA: Diagnosis not present

## 2016-03-11 DIAGNOSIS — Z87891 Personal history of nicotine dependence: Secondary | ICD-10-CM | POA: Diagnosis not present

## 2016-03-11 DIAGNOSIS — Z95810 Presence of automatic (implantable) cardiac defibrillator: Secondary | ICD-10-CM | POA: Diagnosis not present

## 2016-03-13 DIAGNOSIS — I480 Paroxysmal atrial fibrillation: Secondary | ICD-10-CM | POA: Diagnosis not present

## 2016-03-13 DIAGNOSIS — Z951 Presence of aortocoronary bypass graft: Secondary | ICD-10-CM | POA: Diagnosis not present

## 2016-03-13 DIAGNOSIS — Z7901 Long term (current) use of anticoagulants: Secondary | ICD-10-CM | POA: Diagnosis not present

## 2016-04-08 DIAGNOSIS — N400 Enlarged prostate without lower urinary tract symptoms: Secondary | ICD-10-CM | POA: Diagnosis not present

## 2016-04-08 DIAGNOSIS — C61 Malignant neoplasm of prostate: Secondary | ICD-10-CM | POA: Diagnosis not present

## 2016-04-17 DIAGNOSIS — Z7901 Long term (current) use of anticoagulants: Secondary | ICD-10-CM | POA: Diagnosis not present

## 2016-04-17 DIAGNOSIS — Z951 Presence of aortocoronary bypass graft: Secondary | ICD-10-CM | POA: Diagnosis not present

## 2016-04-17 DIAGNOSIS — I480 Paroxysmal atrial fibrillation: Secondary | ICD-10-CM | POA: Diagnosis not present

## 2016-04-24 DIAGNOSIS — Z951 Presence of aortocoronary bypass graft: Secondary | ICD-10-CM | POA: Diagnosis not present

## 2016-04-24 DIAGNOSIS — I5022 Chronic systolic (congestive) heart failure: Secondary | ICD-10-CM | POA: Diagnosis not present

## 2016-04-24 DIAGNOSIS — I10 Essential (primary) hypertension: Secondary | ICD-10-CM | POA: Diagnosis not present

## 2016-04-24 DIAGNOSIS — I2510 Atherosclerotic heart disease of native coronary artery without angina pectoris: Secondary | ICD-10-CM | POA: Diagnosis not present

## 2016-04-26 DIAGNOSIS — Z95810 Presence of automatic (implantable) cardiac defibrillator: Secondary | ICD-10-CM | POA: Diagnosis not present

## 2016-04-26 DIAGNOSIS — I5033 Acute on chronic diastolic (congestive) heart failure: Secondary | ICD-10-CM | POA: Diagnosis not present

## 2016-04-26 DIAGNOSIS — I2510 Atherosclerotic heart disease of native coronary artery without angina pectoris: Secondary | ICD-10-CM | POA: Diagnosis not present

## 2016-04-26 DIAGNOSIS — N183 Chronic kidney disease, stage 3 (moderate): Secondary | ICD-10-CM | POA: Diagnosis not present

## 2016-04-26 DIAGNOSIS — Z9181 History of falling: Secondary | ICD-10-CM | POA: Diagnosis not present

## 2016-04-26 DIAGNOSIS — I480 Paroxysmal atrial fibrillation: Secondary | ICD-10-CM | POA: Diagnosis not present

## 2016-04-26 DIAGNOSIS — Z7901 Long term (current) use of anticoagulants: Secondary | ICD-10-CM | POA: Diagnosis not present

## 2016-04-30 DIAGNOSIS — I5033 Acute on chronic diastolic (congestive) heart failure: Secondary | ICD-10-CM | POA: Diagnosis not present

## 2016-04-30 DIAGNOSIS — I480 Paroxysmal atrial fibrillation: Secondary | ICD-10-CM | POA: Diagnosis not present

## 2016-04-30 DIAGNOSIS — Z9181 History of falling: Secondary | ICD-10-CM | POA: Diagnosis not present

## 2016-04-30 DIAGNOSIS — N183 Chronic kidney disease, stage 3 (moderate): Secondary | ICD-10-CM | POA: Diagnosis not present

## 2016-04-30 DIAGNOSIS — I2510 Atherosclerotic heart disease of native coronary artery without angina pectoris: Secondary | ICD-10-CM | POA: Diagnosis not present

## 2016-04-30 DIAGNOSIS — Z7901 Long term (current) use of anticoagulants: Secondary | ICD-10-CM | POA: Diagnosis not present

## 2016-05-02 DIAGNOSIS — I2510 Atherosclerotic heart disease of native coronary artery without angina pectoris: Secondary | ICD-10-CM | POA: Diagnosis not present

## 2016-05-02 DIAGNOSIS — Z9181 History of falling: Secondary | ICD-10-CM | POA: Diagnosis not present

## 2016-05-02 DIAGNOSIS — I5033 Acute on chronic diastolic (congestive) heart failure: Secondary | ICD-10-CM | POA: Diagnosis not present

## 2016-05-02 DIAGNOSIS — N183 Chronic kidney disease, stage 3 (moderate): Secondary | ICD-10-CM | POA: Diagnosis not present

## 2016-05-02 DIAGNOSIS — Z7901 Long term (current) use of anticoagulants: Secondary | ICD-10-CM | POA: Diagnosis not present

## 2016-05-02 DIAGNOSIS — I480 Paroxysmal atrial fibrillation: Secondary | ICD-10-CM | POA: Diagnosis not present

## 2016-05-07 DIAGNOSIS — Z9181 History of falling: Secondary | ICD-10-CM | POA: Diagnosis not present

## 2016-05-07 DIAGNOSIS — I480 Paroxysmal atrial fibrillation: Secondary | ICD-10-CM | POA: Diagnosis not present

## 2016-05-07 DIAGNOSIS — I2510 Atherosclerotic heart disease of native coronary artery without angina pectoris: Secondary | ICD-10-CM | POA: Diagnosis not present

## 2016-05-07 DIAGNOSIS — Z7901 Long term (current) use of anticoagulants: Secondary | ICD-10-CM | POA: Diagnosis not present

## 2016-05-07 DIAGNOSIS — I5033 Acute on chronic diastolic (congestive) heart failure: Secondary | ICD-10-CM | POA: Diagnosis not present

## 2016-05-07 DIAGNOSIS — N183 Chronic kidney disease, stage 3 (moderate): Secondary | ICD-10-CM | POA: Diagnosis not present

## 2016-05-09 DIAGNOSIS — Z7901 Long term (current) use of anticoagulants: Secondary | ICD-10-CM | POA: Diagnosis not present

## 2016-05-09 DIAGNOSIS — I5033 Acute on chronic diastolic (congestive) heart failure: Secondary | ICD-10-CM | POA: Diagnosis not present

## 2016-05-09 DIAGNOSIS — I2510 Atherosclerotic heart disease of native coronary artery without angina pectoris: Secondary | ICD-10-CM | POA: Diagnosis not present

## 2016-05-09 DIAGNOSIS — N183 Chronic kidney disease, stage 3 (moderate): Secondary | ICD-10-CM | POA: Diagnosis not present

## 2016-05-09 DIAGNOSIS — Z9181 History of falling: Secondary | ICD-10-CM | POA: Diagnosis not present

## 2016-05-09 DIAGNOSIS — I480 Paroxysmal atrial fibrillation: Secondary | ICD-10-CM | POA: Diagnosis not present

## 2016-05-13 DIAGNOSIS — I2510 Atherosclerotic heart disease of native coronary artery without angina pectoris: Secondary | ICD-10-CM | POA: Diagnosis not present

## 2016-05-13 DIAGNOSIS — Z9181 History of falling: Secondary | ICD-10-CM | POA: Diagnosis not present

## 2016-05-13 DIAGNOSIS — I5033 Acute on chronic diastolic (congestive) heart failure: Secondary | ICD-10-CM | POA: Diagnosis not present

## 2016-05-13 DIAGNOSIS — Z7901 Long term (current) use of anticoagulants: Secondary | ICD-10-CM | POA: Diagnosis not present

## 2016-05-13 DIAGNOSIS — I480 Paroxysmal atrial fibrillation: Secondary | ICD-10-CM | POA: Diagnosis not present

## 2016-05-13 DIAGNOSIS — N183 Chronic kidney disease, stage 3 (moderate): Secondary | ICD-10-CM | POA: Diagnosis not present

## 2016-05-15 DIAGNOSIS — I480 Paroxysmal atrial fibrillation: Secondary | ICD-10-CM | POA: Diagnosis not present

## 2016-05-15 DIAGNOSIS — N183 Chronic kidney disease, stage 3 (moderate): Secondary | ICD-10-CM | POA: Diagnosis not present

## 2016-05-15 DIAGNOSIS — Z9181 History of falling: Secondary | ICD-10-CM | POA: Diagnosis not present

## 2016-05-15 DIAGNOSIS — I2510 Atherosclerotic heart disease of native coronary artery without angina pectoris: Secondary | ICD-10-CM | POA: Diagnosis not present

## 2016-05-15 DIAGNOSIS — I5033 Acute on chronic diastolic (congestive) heart failure: Secondary | ICD-10-CM | POA: Diagnosis not present

## 2016-05-15 DIAGNOSIS — Z7901 Long term (current) use of anticoagulants: Secondary | ICD-10-CM | POA: Diagnosis not present

## 2016-05-20 DIAGNOSIS — I2510 Atherosclerotic heart disease of native coronary artery without angina pectoris: Secondary | ICD-10-CM | POA: Diagnosis not present

## 2016-05-20 DIAGNOSIS — Z9181 History of falling: Secondary | ICD-10-CM | POA: Diagnosis not present

## 2016-05-20 DIAGNOSIS — I5033 Acute on chronic diastolic (congestive) heart failure: Secondary | ICD-10-CM | POA: Diagnosis not present

## 2016-05-20 DIAGNOSIS — I480 Paroxysmal atrial fibrillation: Secondary | ICD-10-CM | POA: Diagnosis not present

## 2016-05-20 DIAGNOSIS — Z7901 Long term (current) use of anticoagulants: Secondary | ICD-10-CM | POA: Diagnosis not present

## 2016-05-20 DIAGNOSIS — N183 Chronic kidney disease, stage 3 (moderate): Secondary | ICD-10-CM | POA: Diagnosis not present

## 2016-05-22 DIAGNOSIS — Z95810 Presence of automatic (implantable) cardiac defibrillator: Secondary | ICD-10-CM | POA: Diagnosis not present

## 2016-05-22 DIAGNOSIS — Z79899 Other long term (current) drug therapy: Secondary | ICD-10-CM | POA: Diagnosis not present

## 2016-05-22 DIAGNOSIS — R42 Dizziness and giddiness: Secondary | ICD-10-CM | POA: Diagnosis not present

## 2016-05-22 DIAGNOSIS — I480 Paroxysmal atrial fibrillation: Secondary | ICD-10-CM | POA: Diagnosis not present

## 2016-05-22 DIAGNOSIS — Z951 Presence of aortocoronary bypass graft: Secondary | ICD-10-CM | POA: Diagnosis not present

## 2016-05-22 DIAGNOSIS — N183 Chronic kidney disease, stage 3 (moderate): Secondary | ICD-10-CM | POA: Diagnosis not present

## 2016-05-22 DIAGNOSIS — Z7901 Long term (current) use of anticoagulants: Secondary | ICD-10-CM | POA: Diagnosis not present

## 2016-05-22 DIAGNOSIS — I25810 Atherosclerosis of coronary artery bypass graft(s) without angina pectoris: Secondary | ICD-10-CM | POA: Diagnosis not present

## 2016-05-22 DIAGNOSIS — D539 Nutritional anemia, unspecified: Secondary | ICD-10-CM | POA: Diagnosis not present

## 2016-05-23 DIAGNOSIS — I5033 Acute on chronic diastolic (congestive) heart failure: Secondary | ICD-10-CM | POA: Diagnosis not present

## 2016-05-23 DIAGNOSIS — N183 Chronic kidney disease, stage 3 (moderate): Secondary | ICD-10-CM | POA: Diagnosis not present

## 2016-05-23 DIAGNOSIS — Z7901 Long term (current) use of anticoagulants: Secondary | ICD-10-CM | POA: Diagnosis not present

## 2016-05-23 DIAGNOSIS — Z9181 History of falling: Secondary | ICD-10-CM | POA: Diagnosis not present

## 2016-05-23 DIAGNOSIS — I480 Paroxysmal atrial fibrillation: Secondary | ICD-10-CM | POA: Diagnosis not present

## 2016-05-23 DIAGNOSIS — I2510 Atherosclerotic heart disease of native coronary artery without angina pectoris: Secondary | ICD-10-CM | POA: Diagnosis not present

## 2016-05-27 DIAGNOSIS — I480 Paroxysmal atrial fibrillation: Secondary | ICD-10-CM | POA: Diagnosis not present

## 2016-05-27 DIAGNOSIS — I5033 Acute on chronic diastolic (congestive) heart failure: Secondary | ICD-10-CM | POA: Diagnosis not present

## 2016-05-27 DIAGNOSIS — I2510 Atherosclerotic heart disease of native coronary artery without angina pectoris: Secondary | ICD-10-CM | POA: Diagnosis not present

## 2016-05-27 DIAGNOSIS — N183 Chronic kidney disease, stage 3 (moderate): Secondary | ICD-10-CM | POA: Diagnosis not present

## 2016-05-27 DIAGNOSIS — Z9181 History of falling: Secondary | ICD-10-CM | POA: Diagnosis not present

## 2016-05-27 DIAGNOSIS — Z7901 Long term (current) use of anticoagulants: Secondary | ICD-10-CM | POA: Diagnosis not present

## 2016-05-29 DIAGNOSIS — I480 Paroxysmal atrial fibrillation: Secondary | ICD-10-CM | POA: Diagnosis not present

## 2016-05-29 DIAGNOSIS — I5033 Acute on chronic diastolic (congestive) heart failure: Secondary | ICD-10-CM | POA: Diagnosis not present

## 2016-05-29 DIAGNOSIS — Z9181 History of falling: Secondary | ICD-10-CM | POA: Diagnosis not present

## 2016-05-29 DIAGNOSIS — N183 Chronic kidney disease, stage 3 (moderate): Secondary | ICD-10-CM | POA: Diagnosis not present

## 2016-05-29 DIAGNOSIS — I2510 Atherosclerotic heart disease of native coronary artery without angina pectoris: Secondary | ICD-10-CM | POA: Diagnosis not present

## 2016-05-29 DIAGNOSIS — Z7901 Long term (current) use of anticoagulants: Secondary | ICD-10-CM | POA: Diagnosis not present

## 2016-06-04 DIAGNOSIS — I480 Paroxysmal atrial fibrillation: Secondary | ICD-10-CM | POA: Diagnosis not present

## 2016-06-04 DIAGNOSIS — Z9181 History of falling: Secondary | ICD-10-CM | POA: Diagnosis not present

## 2016-06-04 DIAGNOSIS — Z7901 Long term (current) use of anticoagulants: Secondary | ICD-10-CM | POA: Diagnosis not present

## 2016-06-04 DIAGNOSIS — I2510 Atherosclerotic heart disease of native coronary artery without angina pectoris: Secondary | ICD-10-CM | POA: Diagnosis not present

## 2016-06-04 DIAGNOSIS — N183 Chronic kidney disease, stage 3 (moderate): Secondary | ICD-10-CM | POA: Diagnosis not present

## 2016-06-04 DIAGNOSIS — I5033 Acute on chronic diastolic (congestive) heart failure: Secondary | ICD-10-CM | POA: Diagnosis not present

## 2016-06-07 DIAGNOSIS — I480 Paroxysmal atrial fibrillation: Secondary | ICD-10-CM | POA: Diagnosis not present

## 2016-06-07 DIAGNOSIS — I2510 Atherosclerotic heart disease of native coronary artery without angina pectoris: Secondary | ICD-10-CM | POA: Diagnosis not present

## 2016-06-07 DIAGNOSIS — I5033 Acute on chronic diastolic (congestive) heart failure: Secondary | ICD-10-CM | POA: Diagnosis not present

## 2016-06-07 DIAGNOSIS — N183 Chronic kidney disease, stage 3 (moderate): Secondary | ICD-10-CM | POA: Diagnosis not present

## 2016-06-07 DIAGNOSIS — Z7901 Long term (current) use of anticoagulants: Secondary | ICD-10-CM | POA: Diagnosis not present

## 2016-06-07 DIAGNOSIS — Z9181 History of falling: Secondary | ICD-10-CM | POA: Diagnosis not present

## 2016-07-03 DIAGNOSIS — I25810 Atherosclerosis of coronary artery bypass graft(s) without angina pectoris: Secondary | ICD-10-CM | POA: Diagnosis not present

## 2016-07-03 DIAGNOSIS — Z7901 Long term (current) use of anticoagulants: Secondary | ICD-10-CM | POA: Diagnosis not present

## 2016-07-03 DIAGNOSIS — I480 Paroxysmal atrial fibrillation: Secondary | ICD-10-CM | POA: Diagnosis not present

## 2016-07-03 DIAGNOSIS — Z951 Presence of aortocoronary bypass graft: Secondary | ICD-10-CM | POA: Diagnosis not present

## 2016-08-11 HISTORY — PX: TRANSTHORACIC ECHOCARDIOGRAM: SHX275

## 2016-08-19 ENCOUNTER — Telehealth: Payer: Self-pay | Admitting: Behavioral Health

## 2016-08-19 NOTE — Telephone Encounter (Signed)
Patient provided some pre-visit info, but did not complete all questions. He voiced that he received the new patient packet form & filled it out in its entirety. Per the patient, he has already returned the forms to the office.

## 2016-08-21 ENCOUNTER — Ambulatory Visit (INDEPENDENT_AMBULATORY_CARE_PROVIDER_SITE_OTHER): Payer: Medicare Other | Admitting: Family Medicine

## 2016-08-21 ENCOUNTER — Encounter: Payer: Self-pay | Admitting: Family Medicine

## 2016-08-21 VITALS — BP 120/70 | HR 60 | Temp 97.7°F | Ht 70.0 in | Wt 201.8 lb

## 2016-08-21 DIAGNOSIS — I4819 Other persistent atrial fibrillation: Secondary | ICD-10-CM | POA: Insufficient documentation

## 2016-08-21 DIAGNOSIS — E039 Hypothyroidism, unspecified: Secondary | ICD-10-CM | POA: Diagnosis not present

## 2016-08-21 DIAGNOSIS — I480 Paroxysmal atrial fibrillation: Secondary | ICD-10-CM

## 2016-08-21 LAB — POCT INR: INR: 2.9

## 2016-08-21 MED ORDER — LEVOTHYROXINE SODIUM 50 MCG PO TABS: 50.0000 ug | ORAL_TABLET | Freq: Every day | ORAL | 1 refills | Status: DC

## 2016-08-21 MED ORDER — WARFARIN SODIUM 6 MG PO TABS: 6.0000 mg | ORAL_TABLET | Freq: Every day | ORAL | 1 refills | Status: DC

## 2016-08-21 NOTE — Progress Notes (Signed)
Chief Complaint  Patient presents with  . Establish Care    pt needing meds refilled and INR checked       New Patient Visit SUBJECTIVE: HPI: Kenneth Bell is an 83 y.o.male who is being seen for establishing care.  The patient was previously seen at an office in [place].  He has a history of paroxysmal atrial fibrillation for which he takes Coumadin for. He takes 6 mg daily and half a tab on Saturday. He is getting set up with a cardiologist. He has an appointment scheduled with Dr. Ellyn Hack. He has been having some intermittent shortness of breath and fatigue since having a triple bypass surgery.  The patient also has a history of hypothyroidism. He does not know why he has this. He does not have a family history of thyroid disorders nor has he had a surgery/radiation. He has been taking 50 g of Synthroid for many years. He has been out for the last 2 days.  Allergies  Allergen Reactions  . Nitroglycerin Other (See Comments)    DROPS BLOOD PRESSURE DRASTICALLY    Past Medical History:  Diagnosis Date  . Atrial fibrillation ([place])   . Heart disease   . Hyperlipidemia   . Hypertension   . PAF (paroxysmal atrial fibrillation) ([place]) 08/21/2016  . Thyroid disease    Past Surgical History:  Procedure Laterality Date  . APPENDECTOMY    . CARPAL TUNNEL RELEASE Right 2017  . KNEE SURGERY Bilateral 1999  . SHOULDER SURGERY Bilateral    Twice  . VEIN BYPASS SURGERY  2013   Social History   Social History  . Marital status: Married   Social History Main Topics  . Smoking status: Former Smoker    Types: Cigarettes    Quit date: 05/30/1967  . Smokeless tobacco: Never Used  . Alcohol use 3.6 oz/week    6 Glasses of wine per week  . Drug use: No   Family History  Problem Relation Age of Onset  . Arthritis Mother   . Heart disease Father      Current Outpatient Prescriptions:  .  bumetanide (BUMEX) 1 MG tablet, Take 1 mg by mouth 2 (two) times daily., Disp: , Rfl:  .   levothyroxine (SYNTHROID, LEVOTHROID) 50 MCG tablet, Take 1 tablet (50 mcg total) by mouth daily., Disp: 90 tablet, Rfl: 1 .  metoprolol succinate (TOPROL-XL) 25 MG 24 hr tablet, Take 12.5 mg by mouth daily., Disp: , Rfl:  .  omeprazole (PRILOSEC) 20 MG capsule, Take 20 mg by mouth daily., Disp: , Rfl:  .  potassium chloride (MICRO-K) 10 MEQ CR capsule, Take 10 mEq by mouth daily., Disp: , Rfl:  .  simvastatin (ZOCOR) 20 MG tablet, Take 20 mg by mouth daily., Disp: , Rfl:  .  warfarin (COUMADIN) 6 MG tablet, Take 1 tablet (6 mg total) by mouth daily at 6 PM. Take 1 tablet by mouth on (S,T,W,TH,S) and 1/2 tablet on (M and F)., Disp: 90 tablet, Rfl: 1  ROS Cardiovascular: Denies Palpitations   Respiratory: Denies Cough    OBJECTIVE: BP 120/70 (BP Location: Left Arm, Patient Position: Sitting, Cuff Size: Normal)   Pulse 60   Temp 97.7 F (36.5 C) (Oral)   Ht 5\' 10"  (1.778 m)   Wt 201 lb 12.8 oz (91.5 kg)   SpO2 98%   BMI 28.96 kg/m   Constitutional: -  VS reviewed -  Well developed, well nourished, appears stated age -  No apparent distress  Psychiatric: -  Oriented to person, place, and time -  Memory intact -  Affect and mood normal -  Fluent conversation, good eye contact -  Judgment and insight age appropriate  Eye: -  Conjunctivae clear, no discharge -  Pupils symmetric, round, reactive to light  ENMT: -  Oral mucosa without lesions, tongue and uvula midline    Tonsils not enlarged, no erythema, no exudate, trachea midline    Pharynx moist, no lesions, no erythema  Neck: -  No gross swelling, no palpable masses -  Thyroid midline, not enlarged, mobile, no palpable masses  Cardiovascular: -  RRR, no murmurs -  No LE edema  Respiratory: -  Normal respiratory effort, no accessory muscle use, no retraction -  Breath sounds equal, no wheezes, no ronchi, no crackles  Gastrointestinal: -  Bowel sounds normal -  No tenderness, no distention, no guarding, no masses  Skin: -  No  significant lesion on inspection -  Warm and dry to palpation   ASSESSMENT/PLAN: Hypothyroidism, unspecified type - Plan: levothyroxine (SYNTHROID, LEVOTHROID) 50 MCG tablet  PAF (paroxysmal atrial fibrillation) (HCC) - Plan: warfarin (COUMADIN) 6 MG tablet, POCT INR  Patient instructed to sign release of records form from his previous PCP. INR controlled today. No changes. He had labs done around 1 mo ago, he will get [place] those results.  Patient should return in 6 mo or prn. The patient voiced understanding and agreement to the plan.   [place], DO 08/21/16  1:13 PM

## 2016-08-21 NOTE — Patient Instructions (Addendum)
Get our office the results of your labs from 1 mo ago.   See if your cardiologist can call Dr Ellyn Hack to have you see sooner.   For your Coumadin, stay on your current regimen.  Let us know if you need anything.

## 2016-08-26 ENCOUNTER — Inpatient Hospital Stay (HOSPITAL_COMMUNITY): Payer: Medicare Other

## 2016-08-26 ENCOUNTER — Encounter (HOSPITAL_BASED_OUTPATIENT_CLINIC_OR_DEPARTMENT_OTHER): Payer: Self-pay | Admitting: Emergency Medicine

## 2016-08-26 ENCOUNTER — Emergency Department (HOSPITAL_BASED_OUTPATIENT_CLINIC_OR_DEPARTMENT_OTHER): Payer: Medicare Other

## 2016-08-26 ENCOUNTER — Inpatient Hospital Stay (HOSPITAL_BASED_OUTPATIENT_CLINIC_OR_DEPARTMENT_OTHER)
Admission: EM | Admit: 2016-08-26 | Discharge: 2016-08-28 | DRG: 291 | Disposition: A | Discharge status: Home / Self Care | Payer: Medicare Other | Attending: Internal Medicine | Admitting: Internal Medicine

## 2016-08-26 DIAGNOSIS — R7989 Other specified abnormal findings of blood chemistry: Secondary | ICD-10-CM

## 2016-08-26 DIAGNOSIS — I509 Heart failure, unspecified: Secondary | ICD-10-CM

## 2016-08-26 DIAGNOSIS — N183 Chronic kidney disease, stage 3 unspecified: Secondary | ICD-10-CM

## 2016-08-26 DIAGNOSIS — E785 Hyperlipidemia, unspecified: Secondary | ICD-10-CM | POA: Diagnosis present

## 2016-08-26 DIAGNOSIS — I5023 Acute on chronic systolic (congestive) heart failure: Secondary | ICD-10-CM | POA: Diagnosis not present

## 2016-08-26 DIAGNOSIS — Z8679 Personal history of other diseases of the circulatory system: Secondary | ICD-10-CM

## 2016-08-26 DIAGNOSIS — Z7901 Long term (current) use of anticoagulants: Secondary | ICD-10-CM | POA: Diagnosis not present

## 2016-08-26 DIAGNOSIS — Z888 Allergy status to other drugs, medicaments and biological substances status: Secondary | ICD-10-CM

## 2016-08-26 DIAGNOSIS — I2510 Atherosclerotic heart disease of native coronary artery without angina pectoris: Secondary | ICD-10-CM | POA: Diagnosis present

## 2016-08-26 DIAGNOSIS — I493 Ventricular premature depolarization: Secondary | ICD-10-CM | POA: Diagnosis present

## 2016-08-26 DIAGNOSIS — I25810 Atherosclerosis of coronary artery bypass graft(s) without angina pectoris: Secondary | ICD-10-CM | POA: Diagnosis not present

## 2016-08-26 DIAGNOSIS — J811 Chronic pulmonary edema: Secondary | ICD-10-CM | POA: Diagnosis present

## 2016-08-26 DIAGNOSIS — R0602 Shortness of breath: Secondary | ICD-10-CM | POA: Diagnosis not present

## 2016-08-26 DIAGNOSIS — Z79890 Hormone replacement therapy (postmenopausal): Secondary | ICD-10-CM | POA: Diagnosis not present

## 2016-08-26 DIAGNOSIS — Z8249 Family history of ischemic heart disease and other diseases of the circulatory system: Secondary | ICD-10-CM | POA: Diagnosis not present

## 2016-08-26 DIAGNOSIS — I517 Cardiomegaly: Secondary | ICD-10-CM

## 2016-08-26 DIAGNOSIS — I371 Nonrheumatic pulmonary valve insufficiency: Secondary | ICD-10-CM

## 2016-08-26 DIAGNOSIS — I360 Nonrheumatic tricuspid (valve) stenosis: Secondary | ICD-10-CM | POA: Diagnosis not present

## 2016-08-26 DIAGNOSIS — I252 Old myocardial infarction: Secondary | ICD-10-CM

## 2016-08-26 DIAGNOSIS — I071 Rheumatic tricuspid insufficiency: Secondary | ICD-10-CM

## 2016-08-26 DIAGNOSIS — R748 Abnormal levels of other serum enzymes: Secondary | ICD-10-CM | POA: Diagnosis not present

## 2016-08-26 DIAGNOSIS — E079 Disorder of thyroid, unspecified: Secondary | ICD-10-CM | POA: Diagnosis present

## 2016-08-26 DIAGNOSIS — I255 Ischemic cardiomyopathy: Secondary | ICD-10-CM

## 2016-08-26 DIAGNOSIS — N179 Acute kidney failure, unspecified: Secondary | ICD-10-CM | POA: Diagnosis present

## 2016-08-26 DIAGNOSIS — Z8261 Family history of arthritis: Secondary | ICD-10-CM

## 2016-08-26 DIAGNOSIS — Z95810 Presence of automatic (implantable) cardiac defibrillator: Secondary | ICD-10-CM

## 2016-08-26 DIAGNOSIS — I248 Other forms of acute ischemic heart disease: Secondary | ICD-10-CM | POA: Diagnosis present

## 2016-08-26 DIAGNOSIS — I351 Nonrheumatic aortic (valve) insufficiency: Secondary | ICD-10-CM | POA: Diagnosis not present

## 2016-08-26 DIAGNOSIS — I130 Hypertensive heart and chronic kidney disease with heart failure and stage 1 through stage 4 chronic kidney disease, or unspecified chronic kidney disease: Principal | ICD-10-CM | POA: Diagnosis present

## 2016-08-26 DIAGNOSIS — Z79899 Other long term (current) drug therapy: Secondary | ICD-10-CM | POA: Diagnosis not present

## 2016-08-26 DIAGNOSIS — J90 Pleural effusion, not elsewhere classified: Secondary | ICD-10-CM | POA: Diagnosis present

## 2016-08-26 DIAGNOSIS — I481 Persistent atrial fibrillation: Secondary | ICD-10-CM | POA: Diagnosis present

## 2016-08-26 DIAGNOSIS — I480 Paroxysmal atrial fibrillation: Secondary | ICD-10-CM | POA: Diagnosis not present

## 2016-08-26 DIAGNOSIS — R778 Other specified abnormalities of plasma proteins: Secondary | ICD-10-CM

## 2016-08-26 DIAGNOSIS — I25709 Atherosclerosis of coronary artery bypass graft(s), unspecified, with unspecified angina pectoris: Secondary | ICD-10-CM

## 2016-08-26 DIAGNOSIS — Z955 Presence of coronary angioplasty implant and graft: Secondary | ICD-10-CM

## 2016-08-26 DIAGNOSIS — Z87442 Personal history of urinary calculi: Secondary | ICD-10-CM

## 2016-08-26 DIAGNOSIS — I4819 Other persistent atrial fibrillation: Secondary | ICD-10-CM | POA: Diagnosis present

## 2016-08-26 DIAGNOSIS — Z951 Presence of aortocoronary bypass graft: Secondary | ICD-10-CM

## 2016-08-26 DIAGNOSIS — I5042 Chronic combined systolic (congestive) and diastolic (congestive) heart failure: Secondary | ICD-10-CM | POA: Insufficient documentation

## 2016-08-26 DIAGNOSIS — Z87891 Personal history of nicotine dependence: Secondary | ICD-10-CM

## 2016-08-26 HISTORY — DX: Chronic kidney disease, stage 3 unspecified: N18.30

## 2016-08-26 HISTORY — DX: Chronic kidney disease, stage 3 (moderate): N18.3

## 2016-08-26 HISTORY — DX: Other persistent atrial fibrillation: I48.19

## 2016-08-26 LAB — COMPREHENSIVE METABOLIC PANEL
ALT: 22 U/L (ref 17–63)
ANION GAP: 9 (ref 5–15)
AST: 38 U/L (ref 15–41)
Albumin: 3.4 g/dL — ABNORMAL LOW (ref 3.5–5.0)
Alkaline Phosphatase: 85 U/L (ref 38–126)
BUN: 39 mg/dL — ABNORMAL HIGH (ref 6–20)
CHLORIDE: 106 mmol/L (ref 101–111)
CO2: 22 mmol/L (ref 22–32)
CREATININE: 1.55 mg/dL — AB (ref 0.61–1.24)
Calcium: 9.7 mg/dL (ref 8.9–10.3)
GFR, EST AFRICAN AMERICAN: 44 mL/min — AB (ref >60)
GFR, EST NON AFRICAN AMERICAN: 38 mL/min — AB (ref >60)
Glucose, Bld: 106 mg/dL — ABNORMAL HIGH (ref 65–99)
POTASSIUM: 3.9 mmol/L (ref 3.5–5.1)
SODIUM: 137 mmol/L (ref 135–145)
Total Bilirubin: 1 mg/dL (ref 0.3–1.2)
Total Protein: 7.1 g/dL (ref 6.5–8.1)

## 2016-08-26 LAB — URINALYSIS, ROUTINE W REFLEX MICROSCOPIC
BILIRUBIN URINE: NEGATIVE
Glucose, UA: NEGATIVE mg/dL
KETONES UR: NEGATIVE mg/dL
Leukocytes, UA: NEGATIVE
Nitrite: NEGATIVE
Protein, ur: NEGATIVE mg/dL
SPECIFIC GRAVITY, URINE: 1.011 (ref 1.005–1.030)
pH: 6 (ref 5.0–8.0)

## 2016-08-26 LAB — CBC WITH DIFFERENTIAL/PLATELET
Basophils Absolute: 0 10*3/uL (ref 0.0–0.1)
Basophils Relative: 1 %
EOS ABS: 0.3 10*3/uL (ref 0.0–0.7)
EOS PCT: 6 %
HCT: 35.2 % — ABNORMAL LOW (ref 39.0–52.0)
Hemoglobin: 11.6 g/dL — ABNORMAL LOW (ref 13.0–17.0)
LYMPHS ABS: 0.9 10*3/uL (ref 0.7–4.0)
LYMPHS PCT: 21 %
MCH: 31.3 pg (ref 26.0–34.0)
MCHC: 33 g/dL (ref 30.0–36.0)
MCV: 94.9 fL (ref 78.0–100.0)
MONO ABS: 0.5 10*3/uL (ref 0.1–1.0)
Monocytes Relative: 11 %
Neutro Abs: 2.7 10*3/uL (ref 1.7–7.7)
Neutrophils Relative %: 61 %
PLATELETS: 175 10*3/uL (ref 150–400)
RBC: 3.71 MIL/uL — ABNORMAL LOW (ref 4.22–5.81)
RDW: 15.3 % (ref 11.5–15.5)
WBC: 4.4 10*3/uL (ref 4.0–10.5)

## 2016-08-26 LAB — TSH: TSH: 3.425 u[IU]/mL (ref 0.350–4.500)

## 2016-08-26 LAB — URINALYSIS, MICROSCOPIC (REFLEX)

## 2016-08-26 LAB — BRAIN NATRIURETIC PEPTIDE: B NATRIURETIC PEPTIDE 5: 636.9 pg/mL — AB (ref 0.0–100.0)

## 2016-08-26 LAB — TROPONIN I
TROPONIN I: 0.12 ng/mL — AB (ref <0.03)
TROPONIN I: 0.13 ng/mL — AB (ref <0.03)
Troponin I: 0.11 ng/mL (ref <0.03)

## 2016-08-26 LAB — PROTIME-INR
INR: 2.37
PROTHROMBIN TIME: 26.3 s — AB (ref 11.4–15.2)

## 2016-08-26 MED ORDER — SODIUM CHLORIDE 0.9% FLUSH
3.0000 mL | Freq: Two times a day (BID) | INTRAVENOUS | Status: DC
  Administered 2016-08-26 – 2016-08-28 (×5): 3 mL via INTRAVENOUS

## 2016-08-26 MED ORDER — WARFARIN SODIUM 3 MG PO TABS
3.0000 mg | ORAL_TABLET | Freq: Once | ORAL | Status: AC
  Administered 2016-08-26: 3 mg via ORAL
  Filled 2016-08-26: qty 1

## 2016-08-26 MED ORDER — FUROSEMIDE 10 MG/ML IJ SOLN
40.0000 mg | Freq: Once | INTRAMUSCULAR | Status: AC
  Administered 2016-08-26: 40 mg via INTRAVENOUS
  Filled 2016-08-26: qty 4

## 2016-08-26 MED ORDER — ORAL CARE MOUTH RINSE
15.0000 mL | Freq: Two times a day (BID) | OROMUCOSAL | Status: DC
  Administered 2016-08-27 – 2016-08-28 (×2): 15 mL via OROMUCOSAL

## 2016-08-26 MED ORDER — SIMVASTATIN 20 MG PO TABS
20.0000 mg | ORAL_TABLET | Freq: Every day | ORAL | Status: DC
  Administered 2016-08-26 – 2016-08-27 (×2): 20 mg via ORAL
  Filled 2016-08-26 (×2): qty 1

## 2016-08-26 MED ORDER — LEVOTHYROXINE SODIUM 50 MCG PO TABS
50.0000 ug | ORAL_TABLET | Freq: Every day | ORAL | Status: DC
  Filled 2016-08-26: qty 1

## 2016-08-26 MED ORDER — METOPROLOL SUCCINATE ER 25 MG PO TB24
12.5000 mg | ORAL_TABLET | Freq: Every day | ORAL | Status: DC
  Administered 2016-08-26 – 2016-08-28 (×3): 12.5 mg via ORAL
  Filled 2016-08-26 (×3): qty 1

## 2016-08-26 MED ORDER — POTASSIUM CHLORIDE CRYS ER 10 MEQ PO TBCR
10.0000 meq | EXTENDED_RELEASE_TABLET | Freq: Every day | ORAL | Status: DC
  Administered 2016-08-26 – 2016-08-28 (×3): 10 meq via ORAL
  Filled 2016-08-26 (×3): qty 1

## 2016-08-26 MED ORDER — ASPIRIN 81 MG PO CHEW
324.0000 mg | CHEWABLE_TABLET | Freq: Once | ORAL | Status: AC
  Administered 2016-08-26: 324 mg via ORAL
  Filled 2016-08-26: qty 4

## 2016-08-26 MED ORDER — SODIUM CHLORIDE 0.9 % IV SOLN: 250.0000 mL | INTRAVENOUS | Status: DC | PRN

## 2016-08-26 MED ORDER — FUROSEMIDE 10 MG/ML IJ SOLN
80.0000 mg | Freq: Two times a day (BID) | INTRAMUSCULAR | Status: DC
  Administered 2016-08-26 – 2016-08-27 (×2): 80 mg via INTRAVENOUS
  Filled 2016-08-26 (×2): qty 8

## 2016-08-26 MED ORDER — PANTOPRAZOLE SODIUM 40 MG PO TBEC
40.0000 mg | DELAYED_RELEASE_TABLET | Freq: Every day | ORAL | Status: DC
  Administered 2016-08-26 – 2016-08-28 (×3): 40 mg via ORAL
  Filled 2016-08-26 (×3): qty 1

## 2016-08-26 MED ORDER — ASPIRIN EC 81 MG PO TBEC
81.0000 mg | DELAYED_RELEASE_TABLET | Freq: Every day | ORAL | Status: DC
  Administered 2016-08-27 – 2016-08-28 (×2): 81 mg via ORAL
  Filled 2016-08-26 (×3): qty 1

## 2016-08-26 MED ORDER — WARFARIN - PHARMACIST DOSING INPATIENT
Freq: Every day | Status: DC
  Administered 2016-08-26 – 2016-08-28 (×2)

## 2016-08-26 MED ORDER — ACETAMINOPHEN 325 MG PO TABS
650.0000 mg | ORAL_TABLET | ORAL | Status: DC | PRN
  Administered 2016-08-26 – 2016-08-27 (×2): 650 mg via ORAL
  Filled 2016-08-26 (×2): qty 2

## 2016-08-26 MED ORDER — ONDANSETRON HCL 4 MG/2ML IJ SOLN: 4.0000 mg | Freq: Four times a day (QID) | INTRAMUSCULAR | Status: DC | PRN

## 2016-08-26 MED ORDER — LEVOTHYROXINE SODIUM 50 MCG PO TABS
50.0000 ug | ORAL_TABLET | Freq: Every day | ORAL | Status: DC
  Administered 2016-08-26 – 2016-08-28 (×3): 50 ug via ORAL
  Filled 2016-08-26 (×2): qty 1

## 2016-08-26 MED ORDER — SODIUM CHLORIDE 0.9% FLUSH: 3.0000 mL | INTRAVENOUS | Status: DC | PRN

## 2016-08-26 NOTE — Progress Notes (Signed)
  Echocardiogram 2D Echocardiogram has been performed.  Alyssamarie Mounsey T Breyah Akhter 08/26/2016, 5:32 PM

## 2016-08-26 NOTE — ED Notes (Signed)
Primary nurse Marva and dr. Lita Mains notified of troponin 0.11. No new orders.

## 2016-08-26 NOTE — Progress Notes (Signed)
CRITICAL VALUE ALERT  Critical Value: troponin 0.12  Date & Time Notied:  08/26/2016 3.50pm  Provider Notified: Dr Debara Pickett  Orders Received/Actions taken: continue to monitor the patient

## 2016-08-26 NOTE — Progress Notes (Signed)
Pt is stable, diuresing well, vitals stable, no any sign of distress and complain of pain, will continue to monitor the patient

## 2016-08-26 NOTE — Progress Notes (Signed)
Patient, Kenneth Bell, arrived to room 3E01 at 1215 via [facility] transport.  He was transported from [employer] [employer].  Patient oriented to room and equipment and call bell within reach. Cardiology Pagemaster notified of patient's arrival and room number.

## 2016-08-26 NOTE — ED Notes (Signed)
Pt placed on O2 at 2L for O2 sat of 85%

## 2016-08-26 NOTE — H&P (Signed)
ADMISSION HISTORY & PHYSICAL  Patient Name: Kenneth Bell Date of Encounter: 08/26/2016 Primary Care Physician: [name], DO Cardiologist: Dr. Darrold Span [facility]) - plans to establish with Dr. Ellyn Hack (appt. 8/3)  Hospital Problem List   Principal Problem:   Acute on chronic systolic heart failure [facility]) Active Problems:   PAF (paroxysmal atrial fibrillation) (HCC)   CKD (chronic kidney disease), stage III   CAD (coronary artery disease)    Chief Complaint    Shortness of breath  HPI  This is a 83 y.o. male with a past medical history significant for CAD s/p CABG x 4 in 06/2011 (LIMA to LAD, SVG to OM2, SVG to PDA and SVG to RI). Last cath in 06/2015 Revealed patent left main, diffusely diseased LAD with competitive flow in the distal LAD, 35% mid circumflex disease with occlusion of the first obtuse marginal branch and 90% distal circumflex disease. The second obtuse marginal branch was occluded and this is perfused via a saphenous vein graft. The right coronary artery had a 70% ostial disease segment and 100% occlusion of the distal RCA. Vein grafts indicated widely patent LIMA to LAD, widely patent SVG to obtuse marginal, 40% proximal vessel disease and distal vessel disease of the SVG to PDA, and 100% occlusion of the proximal SVG to ramus intermedius. Last Myoview was in March 2017 which was abnormal demonstrating prior infarction of the lateral wall without any inducible ischemia. EF was 31% with moderate global hypokinesis. Echo in July 2017 demonstrated moderate LV dysfunction with global hypokinesis possibly worse in the anterolateral inferolateral and apical segments. He has a history of biventricular AICD implant [employer]) and paroxysmal atrial fibrillation on chronic oral anticoagulants. Additionally has a history of brain aneurysm in 2008, congestive heart failure, hypertension, nephrolithiasis, prior MI in 2002, and ICD placement in 2008. Recent office  weight in March 2018 when he last saw his cardiologist was 199 pounds.   He presents today with acute onset shortness of breath. He says over the past several days his breathing has become more labored, especially at night, He has had orthopnea and DOE. Weight is recorded lower 196 pounds however BNP is elevated at 637 with a mildly elevated troponin of 0.11. H&H is 11 and 35. Chest x-ray shows mild congestive heart failure with small bilateral pleural effusions and interstitial edema. Creatinine is near baseline at 1.55. EKG shows Afib with PVC's - BI-V paced rhythm. He denies any angina.  PMHx   Past Medical History:  Diagnosis Date  . Atrial fibrillation ([place])   . Coronary artery disease   . Heart disease   . Hyperlipidemia   . Hypertension   . Implantable cardioverter-defibrillator (ICD) in situ   . Pacemaker   . PAF (paroxysmal atrial fibrillation) ([place]) 08/21/2016  . Thyroid disease     Past Surgical History:  Procedure Laterality Date  . APPENDECTOMY    . CARDIAC SURGERY    . CARPAL TUNNEL RELEASE Right 2017  . CORONARY ANGIOPLASTY WITH STENT PLACEMENT    . CORONARY ARTERY BYPASS GRAFT    . KNEE SURGERY Bilateral 1999  . SHOULDER SURGERY Bilateral    Twice  . VEIN BYPASS SURGERY  2013    FAMHx   Family History  Problem Relation Age of Onset  . Arthritis Mother   . Heart disease Father     SOCHx    reports that he quit smoking about 49 years ago. His smoking use included Cigarettes. He has never used smokeless  tobacco. He reports that he drinks about 3.6 oz of alcohol per week . He reports that he does not use drugs.  Outpatient Medications   No current facility-administered medications on file prior to encounter.    Current Outpatient Prescriptions on File Prior to Encounter  Medication Sig Dispense Refill  . bumetanide (BUMEX) 1 MG tablet Take 2 mg by mouth daily.     Marland Kitchen levothyroxine (SYNTHROID, LEVOTHROID) 50 MCG tablet Take 1 tablet (50 mcg total) by mouth  daily. 90 tablet 1  . metoprolol succinate (TOPROL-XL) 25 MG 24 hr tablet Take 12.5 mg by mouth daily.    Marland Kitchen omeprazole (PRILOSEC) 20 MG capsule Take 20 mg by mouth daily.    . potassium chloride (MICRO-K) 10 MEQ CR capsule Take 10 mEq by mouth daily.    . simvastatin (ZOCOR) 20 MG tablet Take 20 mg by mouth daily.    Marland Kitchen warfarin (COUMADIN) 6 MG tablet Take 1 tablet (6 mg total) by mouth daily at 6 PM. Take 1 tablet by mouth on (S,T,W,TH,S) and 1/2 tablet on (M and F). 90 tablet 1    Inpatient Medications    Scheduled Meds:   Continuous Infusions:   PRN Meds:    ALLERGIES   Allergies  Allergen Reactions  . Nitroglycerin Other (See Comments)    DROPS BLOOD PRESSURE DRASTICALLY    ROS   Pertinent items noted in HPI and remainder of comprehensive ROS otherwise negative.  Vitals   Vitals:   08/26/16 1000 08/26/16 1030 08/26/16 1100 08/26/16 1221  BP: (!) 155/80 (!) 156/89 (!) 145/91 (!) 157/95  Pulse: (!) 59 (!) 59 60 60  Resp: (!) 23 (!) 23 17   Temp:    97.6 F (36.4 C)  TempSrc:    Oral  SpO2: 97% 99% 97% 99%  Weight:    196 lb 7 oz (89.1 kg)  Height:    _0  (1.778 m)    Intake/Output Summary (Last 24 hours) at 08/26/16 1259 Last data filed at 08/26/16 1122  Gross per 24 hour  Intake                0 ml  Output              850 ml  Net             -850 ml   Filed Weights   08/26/16 0758 08/26/16 1221  Weight: 201 lb (91.2 kg) 196 lb 7 oz (89.1 kg)    Physical Exam   General appearance: alert and no distress Neck: JVD - 5 cm above sternal notch, no carotid bruit and thyroid not enlarged, symmetric, no tenderness/mass/nodules Lungs: diminished breath sounds bibasilar Heart: regular rate and rhythm, S1, S2 normal, S3 present and systolic murmur: early systolic 2/6, blowing at apex Abdomen: soft, non-tender; bowel sounds normal; no masses,  no organomegaly Extremities: extremities normal, atraumatic, no cyanosis or edema Pulses: 2+ and symmetric Skin:  Skin color, texture, turgor normal. No rashes or lesions Neurologic: Grossly normal Psych: Pleasant  Labs   Results for orders placed or performed during the hospital encounter of 08/26/16 (from the past 48 hour(s))  CBC with Differential/Platelet     Status: Abnormal   Collection Time: 08/26/16  8:11 AM  Result Value Ref Range   WBC 4.4 4.0 - 10.5 K/uL   RBC 3.71 (L) 4.22 - 5.81 MIL/uL   Hemoglobin 11.6 (L) 13.0 - 17.0 g/dL   HCT 35.2 (L) 39.0 - 52.0 %  MCV 94.9 78.0 - 100.0 fL   MCH 31.3 26.0 - 34.0 pg   MCHC 33.0 30.0 - 36.0 g/dL   RDW 15.3 11.5 - 15.5 %   Platelets 175 150 - 400 K/uL   Neutrophils Relative % 61 %   Neutro Abs 2.7 1.7 - 7.7 K/uL   Lymphocytes Relative 21 %   Lymphs Abs 0.9 0.7 - 4.0 K/uL   Monocytes Relative 11 %   Monocytes Absolute 0.5 0.1 - 1.0 K/uL   Eosinophils Relative 6 %   Eosinophils Absolute 0.3 0.0 - 0.7 K/uL   Basophils Relative 1 %   Basophils Absolute 0.0 0.0 - 0.1 K/uL  Comprehensive metabolic panel     Status: Abnormal   Collection Time: 08/26/16  8:11 AM  Result Value Ref Range   Sodium 137 135 - 145 mmol/L   Potassium 3.9 3.5 - 5.1 mmol/L   Chloride 106 101 - 111 mmol/L   CO2 22 22 - 32 mmol/L   Glucose, Bld 106 (H) 65 - 99 mg/dL   BUN 39 (H) 6 - 20 mg/dL   Creatinine, Ser 1.55 (H) 0.61 - 1.24 mg/dL   Calcium 9.7 8.9 - 10.3 mg/dL   Total Protein 7.1 6.5 - 8.1 g/dL   Albumin 3.4 (L) 3.5 - 5.0 g/dL   AST 38 15 - 41 U/L   ALT 22 17 - 63 U/L   Alkaline Phosphatase 85 38 - 126 U/L   Total Bilirubin 1.0 0.3 - 1.2 mg/dL   GFR calc non Af Amer 38 (L) >60 mL/min   GFR calc Af Amer 44 (L) >60 mL/min    Comment: (NOTE) The eGFR has been calculated using the CKD EPI equation. This calculation has not been validated in all clinical situations. eGFR's persistently <60 mL/min signify possible Chronic Kidney Disease.    Anion gap 9 5 - 15  Brain natriuretic peptide     Status: Abnormal   Collection Time: 08/26/16  8:11 AM  Result Value  Ref Range   B Natriuretic Peptide 636.9 (H) 0.0 - 100.0 pg/mL  Protime-INR     Status: Abnormal   Collection Time: 08/26/16  8:11 AM  Result Value Ref Range   Prothrombin Time 26.3 (H) 11.4 - 15.2 seconds   INR 2.37   Troponin I     Status: Abnormal   Collection Time: 08/26/16  8:11 AM  Result Value Ref Range   Troponin I 0.11 (HH) <0.03 ng/mL    Comment: CRITICAL RESULT CALLED TO, READ BACK BY AND VERIFIED WITH: CALLED TO A.HARTLEY RN AT [phone] ON [id number] BY SROY   Urinalysis, Routine w reflex microscopic     Status: Abnormal   Collection Time: 08/26/16  9:15 AM  Result Value Ref Range   Color, Urine YELLOW YELLOW   APPearance CLEAR CLEAR   Specific Gravity, Urine 1.011 1.005 - 1.030   pH 6.0 5.0 - 8.0   Glucose, UA NEGATIVE NEGATIVE mg/dL   Hgb urine dipstick MODERATE (A) NEGATIVE   Bilirubin Urine NEGATIVE NEGATIVE   Ketones, ur NEGATIVE NEGATIVE mg/dL   Protein, ur NEGATIVE NEGATIVE mg/dL   Nitrite NEGATIVE NEGATIVE   Leukocytes, UA NEGATIVE NEGATIVE  Urinalysis, Microscopic (reflex)     Status: Abnormal   Collection Time: 08/26/16  9:15 AM  Result Value Ref Range   RBC / HPF 6-30 0 - 5 RBC/hpf   WBC, UA 0-5 0 - 5 WBC/hpf   Bacteria, UA RARE (A) NONE SEEN   Squamous Epithelial /  LPF 0-5 (A) NONE SEEN    ECG   A-fib with CVR, Bi-V paced, PVC's at  - Personally Reviewed  Telemetry   A-fib with BI-V pacing - Personally Reviewed  Radiology   Dg Chest 2 View  Result Date: 08/26/2016 CLINICAL DATA:  Short of breath for years EXAM: CHEST  2 VIEW COMPARISON:  None. FINDINGS: The heart is mildly enlarged. Postoperative changes from CABG are noted. AICD device of via left subclavian vein is present. Several leads are in place. Vascular congestion. Low volumes. No Kerley B lines. Tiny pleural effusions are suspected. No pneumothorax. Increased AP diameter of the chest. IMPRESSION: Mild volume overload characterized by vascular congestion and small pleural effusions. Lungs are  under aerated with bibasilar atelectasis Electronically Signed   By: Marybelle Killings M.D.   On: 08/26/2016 08:36    Cardiac Studies   N/A  Assessment   Principal Problem:   Acute on chronic systolic heart failure (HCC) Active Problems:   PAF (paroxysmal atrial fibrillation) (HCC)   CKD (chronic kidney disease), stage III   CAD (coronary artery disease)   Plan   1. Mr. Conry presents with acute systolic congestive heart failure and worsening shortness of breath over the last several days. He was trying to wait until his appointment with Dr. Ellyn Hack on August 3. He was noted be hypoxic on admission and requiring supplemental oxygen. Chest x-ray demonstrates pulmonary edema and bilateral pleural effusions. He reports he had a history of bilateral thoracentesis in the past. He says he is compliant with his medications and has had no recent anginal symptoms. LVEF is thought to be reduced based on nuclear stress testing last year and an echocardiogram which was considered technically difficult however estimated to be perhaps in the low 40% range. We'll go ahead and repeat that echocardiogram is here. Plan to treat with increased dose Lasix IV 80 mg twice a day. We'll also obtain device interrogation of his [employer] BI-V ICD. We'll follow renal function and if this improves with diuresis, and may be a candidate for addition of ACE inhibitor, ARB or Entresto prior to discharge.  Time Spent Directly with Patient:  I have spent a total of 45 minutes with patient reviewing hospital notes, telemetry, EKGs, labs and examining the patient as well as establishing an assessment and plan that was discussed with the patient. > 50% of time was spent in direct patient care.   Length of Stay:  LOS: 0 days   Pixie Casino, MD, [place]  Attending Cardiologist  Direct Dial: [phone]  Fax: [phone]  Website: [email or web address] 08/26/2016, 12:59  PM

## 2016-08-26 NOTE — ED Triage Notes (Signed)
Pt having increased sob for 2 days.  Pt has history of sob.  Pt states he has had to have his lungs drained in the past.  Pt denies fever or cough.  No peripheral edema noted.

## 2016-08-26 NOTE — Progress Notes (Signed)
Troponin 0.13. Increased from 0.12. Patient denies chest pain. Resting comfortably in the bed. Cardiology MD [facility] notified. No new orders at this time. Will continue to monitor.  Brenn Deziel, RN

## 2016-08-26 NOTE — Progress Notes (Signed)
[place] for warfarin Indication: AFib Allergies  Allergen Reactions  . Nitroglycerin Other (See Comments)    DROPS BLOOD PRESSURE DRASTICALLY    Patient Measurements: Height: 5\' 10"  (177.8 cm) Weight: 196 lb 7 oz (89.1 kg) IBW/kg (Calculated) : 73   Vital Signs: Temp: 97.6 F (36.4 C) (07/16 1221) Temp Source: Oral (07/16 1221) BP: 157/95 (07/16 1221) Pulse Rate: 60 (07/16 1221)  Labs:  Recent Labs  08/26/16 0811  HGB 11.6*  HCT 35.2*  PLT 175  LABPROT 26.3*  INR 2.37  CREATININE 1.55*  TROPONINI 0.11*    Estimated Creatinine Clearance: 37 mL/min (A) (by C-G formula based on SCr of 1.55 mg/dL (H)).   Medical History: Past Medical History:  Diagnosis Date  . Atrial fibrillation ([place])   . Coronary artery disease   . Heart disease   . Hyperlipidemia   . Hypertension   . Implantable cardioverter-defibrillator (ICD) in situ   . Pacemaker   . PAF (paroxysmal atrial fibrillation) ([place]) 08/21/2016  . Thyroid disease     Assessment: 83 yo male on warfarin PTA for hx of AFib. INR 2.3. PTA warfarin: 3 mg po on Mon/Fri, 6 mg on all other days  Goal of Therapy:  INR 2-3 Monitor platelets by anticoagulation protocol: Yes    Plan:  -Warfarin 3 mg po x1 -Daily INR  Harvel Quale 08/26/2016,1:08 PM

## 2016-08-26 NOTE — ED Provider Notes (Signed)
[facility] DEPT [facility] Provider Note   CSN: [id number] Arrival date & time: 08/26/16  0744     History   Chief Complaint Chief Complaint  Patient presents with  . Shortness of Breath    HPI Kenneth Bell is a 83 y.o. male.  HPI Patient with history of atrial fibrillation, previous CABG presents with acute worsening of his chronic shortness of breath. States this worsened over the last 2 days. Had difficulty lying flat last night due to shortness of breath. Denies any chest pain or pressure. States he does have dyspnea with exertion. Denies cough, fever or chills. No new lower extremity swelling or pain. Takes Bumex daily and states he's been compliant with medication. Past Medical History:  Diagnosis Date  . Atrial fibrillation ([place])   . Coronary artery disease   . Heart disease   . Hyperlipidemia   . Hypertension   . Implantable cardioverter-defibrillator (ICD) in situ   . Pacemaker   . PAF (paroxysmal atrial fibrillation) ([place]) 08/21/2016  . Thyroid disease     Patient Active Problem List   Diagnosis Date Noted  . PAF (paroxysmal atrial fibrillation) ([place]) 08/21/2016    Past Surgical History:  Procedure Laterality Date  . APPENDECTOMY    . CARDIAC SURGERY    . CARPAL TUNNEL RELEASE Right 2017  . CORONARY ANGIOPLASTY WITH STENT PLACEMENT    . CORONARY ARTERY BYPASS GRAFT    . KNEE SURGERY Bilateral 1999  . SHOULDER SURGERY Bilateral    Twice  . VEIN BYPASS SURGERY  2013       Home Medications    Prior to Admission medications   Medication Sig Start Date End Date Taking? Authorizing Provider  bumetanide (BUMEX) 1 MG tablet Take 2 mg by mouth daily.    Yes [provider]  levothyroxine (SYNTHROID, LEVOTHROID) 50 MCG tablet Take 1 tablet (50 mcg total) by mouth daily. 08/21/16  Yes [name], DO  metoprolol succinate (TOPROL-XL) 25 MG 24 hr tablet Take 12.5 mg by mouth daily. 10/11/15  Yes [provider]  omeprazole  (PRILOSEC) 20 MG capsule Take 20 mg by mouth daily. 12/15/15  Yes [provider]  potassium chloride (MICRO-K) 10 MEQ CR capsule Take 10 mEq by mouth daily. 06/17/16  Yes [provider]  simvastatin (ZOCOR) 20 MG tablet Take 20 mg by mouth daily. 10/28/15  Yes [provider]  warfarin (COUMADIN) 6 MG tablet Take 1 tablet (6 mg total) by mouth daily at 6 PM. Take 1 tablet by mouth on (S,T,W,TH,S) and 1/2 tablet on (M and F). 08/21/16  Yes Wendling, Crosby Oyster, DO    Family History Family History  Problem Relation Age of Onset  . Arthritis Mother   . Heart disease Father     Social History Social History  Substance Use Topics  . Smoking status: Former Smoker    Types: Cigarettes    Quit date: 05/30/1967  . Smokeless tobacco: Never Used  . Alcohol use 3.6 oz/week    6 Glasses of wine per week     Allergies   Nitroglycerin   Review of Systems Review of Systems  Constitutional: Negative for chills and fever.  Respiratory: Positive for shortness of breath. Negative for cough and chest tightness.   Cardiovascular: Negative for chest pain, palpitations and leg swelling.  Gastrointestinal: Negative for abdominal pain, nausea and vomiting.  Genitourinary: Negative for dysuria, flank pain and frequency.  Musculoskeletal: Negative for back pain, myalgias and neck pain.  Skin: Negative  for rash and wound.  Neurological: Negative for dizziness, weakness, light-headedness, numbness and headaches.  All other systems reviewed and are negative.    Physical Exam Updated Vital Signs BP (!) 155/80   Pulse (!) 59   Temp 97.9 F (36.6 C) (Oral)   Resp (!) 23   Ht 5\' 10"  (1.778 m)   Wt 91.2 kg (201 lb)   SpO2 97%   BMI 28.84 kg/m   Physical Exam  Constitutional: He is oriented to person, place, and time. He appears well-developed and well-nourished. No distress.  HENT:  Head: Normocephalic and atraumatic.  Mouth/Throat: Oropharynx is clear and moist. No  oropharyngeal exudate.  Eyes: Pupils are equal, round, and reactive to light. EOM are normal.  Neck: Normal range of motion. Neck supple. JVD present.  Cardiovascular: Normal rate.   Murmur (systolic murmur) heard. Irregularly irregular  Pulmonary/Chest: Effort normal and breath sounds normal.  Diminished breath sounds bilateral bases.  Abdominal: Soft. Bowel sounds are normal. There is no tenderness. There is no rebound and no guarding.  Musculoskeletal: Normal range of motion. He exhibits edema. He exhibits no tenderness.  1+ edema bilateral lower extremities. No calf asymmetry or tenderness. Distal pulses intact.  Neurological: He is alert and oriented to person, place, and time.  Moving all extremities without focal deficit. Sensation fully intact.  Skin: Skin is warm and dry. Capillary refill takes less than 2 seconds. No rash noted. No erythema.  Psychiatric: He has a normal mood and affect. His behavior is normal.  Nursing note and vitals reviewed.    ED Treatments / Results  Labs (all labs ordered are listed, but only abnormal results are displayed) Labs Reviewed  CBC WITH DIFFERENTIAL/PLATELET - Abnormal; Notable for the following:       Result Value   RBC 3.71 (*)    Hemoglobin 11.6 (*)    HCT 35.2 (*)    All other components within normal limits  COMPREHENSIVE METABOLIC PANEL - Abnormal; Notable for the following:    Glucose, Bld 106 (*)    BUN 39 (*)    Creatinine, Ser 1.55 (*)    Albumin 3.4 (*)    GFR calc non Af Amer 38 (*)    GFR calc Af Amer 44 (*)    All other components within normal limits  BRAIN NATRIURETIC PEPTIDE - Abnormal; Notable for the following:    B Natriuretic Peptide 636.9 (*)    All other components within normal limits  PROTIME-INR - Abnormal; Notable for the following:    Prothrombin Time 26.3 (*)    All other components within normal limits  TROPONIN I - Abnormal; Notable for the following:    Troponin I 0.11 (*)    All other components  within normal limits  URINALYSIS, ROUTINE W REFLEX MICROSCOPIC - Abnormal; Notable for the following:    Hgb urine dipstick MODERATE (*)    All other components within normal limits  URINALYSIS, MICROSCOPIC (REFLEX) - Abnormal; Notable for the following:    Bacteria, UA RARE (*)    Squamous Epithelial / LPF 0-5 (*)    All other components within normal limits    EKG  EKG Interpretation  Date/Time:  Monday August 26 2016 07:55:32 EDT Ventricular Rate:  60 PR Interval:    QRS Duration: 149 QT Interval:  462 QTC Calculation: 462 R Axis:   -79 Text Interpretation:  Atrial fibrillation Ventricular premature complexes Confirmed by Lita Mains  MD, Heath Tesler ([id number]) on 08/26/2016 8:29:37 AM  Radiology Dg Chest 2 View  Result Date: 08/26/2016 CLINICAL DATA:  Short of breath for years EXAM: CHEST  2 VIEW COMPARISON:  None. FINDINGS: The heart is mildly enlarged. Postoperative changes from CABG are noted. AICD device of via left subclavian vein is present. Several leads are in place. Vascular congestion. Low volumes. No Kerley B lines. Tiny pleural effusions are suspected. No pneumothorax. Increased AP diameter of the chest. IMPRESSION: Mild volume overload characterized by vascular congestion and small pleural effusions. Lungs are under aerated with bibasilar atelectasis Electronically Signed   By: Marybelle Killings M.D.   On: 08/26/2016 08:36    Procedures Procedures (including critical care time)  Medications Ordered in ED Medications  furosemide (LASIX) injection 40 mg (40 mg Intravenous Given 08/26/16 0857)  aspirin chewable tablet 324 mg (324 mg Oral Given 08/26/16 1012)     Initial Impression / Assessment and Plan / ED Course  I have reviewed the triage vital signs and the nursing notes.  Pertinent labs & imaging results that were available during my care of the patient were reviewed by me and considered in my medical decision making (see chart for details).      Patient with  evidence of CHF exacerbation. Saturations dropped to 85% on room air. Placed on 2 L by nasal cannula. Given 40 mg of Lasix IV. Also given 325 mg of aspirin. Patient has mild elevation in his troponin and BNP is greater than 600. Currently denying any chest pain. Has baseline renal insufficiency and creatinine is at baseline. Discussed with Dr. Debara Pickett who recommends inpatient treatment. Will accept in transfer to [facility] Discussed with patient and patient's daughter. Agree with plan. Final Clinical Impressions(s) / ED Diagnoses   Final diagnoses:  Acute congestive heart failure, unspecified heart failure type ([place])  Elevated troponin    New Prescriptions New Prescriptions   No medications on file     Julianne Rice, MD 08/26/16 1021

## 2016-08-27 DIAGNOSIS — R778 Other specified abnormalities of plasma proteins: Secondary | ICD-10-CM

## 2016-08-27 DIAGNOSIS — R7989 Other specified abnormal findings of blood chemistry: Secondary | ICD-10-CM

## 2016-08-27 DIAGNOSIS — R748 Abnormal levels of other serum enzymes: Secondary | ICD-10-CM

## 2016-08-27 LAB — BASIC METABOLIC PANEL
ANION GAP: 7 (ref 5–15)
BUN: 39 mg/dL — ABNORMAL HIGH (ref 6–20)
CALCIUM: 9.7 mg/dL (ref 8.9–10.3)
CO2: 26 mmol/L (ref 22–32)
Chloride: 101 mmol/L (ref 101–111)
Creatinine, Ser: 1.98 mg/dL — ABNORMAL HIGH (ref 0.61–1.24)
GFR, EST AFRICAN AMERICAN: 33 mL/min — AB (ref >60)
GFR, EST NON AFRICAN AMERICAN: 28 mL/min — AB (ref >60)
Glucose, Bld: 111 mg/dL — ABNORMAL HIGH (ref 65–99)
Potassium: 3.6 mmol/L (ref 3.5–5.1)
SODIUM: 134 mmol/L — AB (ref 135–145)

## 2016-08-27 LAB — CBC
HCT: 35.7 % — ABNORMAL LOW (ref 39.0–52.0)
Hemoglobin: 11.4 g/dL — ABNORMAL LOW (ref 13.0–17.0)
MCH: 30.4 pg (ref 26.0–34.0)
MCHC: 31.9 g/dL (ref 30.0–36.0)
MCV: 95.2 fL (ref 78.0–100.0)
PLATELETS: 182 10*3/uL (ref 150–400)
RBC: 3.75 MIL/uL — AB (ref 4.22–5.81)
RDW: 15.7 % — ABNORMAL HIGH (ref 11.5–15.5)
WBC: 3.6 10*3/uL — ABNORMAL LOW (ref 4.0–10.5)

## 2016-08-27 LAB — TROPONIN I: Troponin I: 0.13 ng/mL (ref <0.03)

## 2016-08-27 LAB — ECHOCARDIOGRAM COMPLETE
Height: 70 in
WEIGHTICAEL: 3143 [oz_av]

## 2016-08-27 LAB — PROTIME-INR
INR: 2.43
Prothrombin Time: 26.9 seconds — ABNORMAL HIGH (ref 11.4–15.2)

## 2016-08-27 MED ORDER — WARFARIN SODIUM 3 MG PO TABS
6.0000 mg | ORAL_TABLET | Freq: Once | ORAL | Status: AC
  Administered 2016-08-27: 6 mg via ORAL
  Filled 2016-08-27: qty 2

## 2016-08-27 NOTE — Progress Notes (Signed)
[facility] for warfarin Indication: AFib Allergies  Allergen Reactions  . Nitroglycerin Other (See Comments)    DROPS BLOOD PRESSURE DRASTICALLY    Patient Measurements: Height: 5\' 10"  (177.8 cm) Weight: 194 lb (88 kg) IBW/kg (Calculated) : 73   Vital Signs: Temp: 98.4 F (36.9 C) (07/17 0813) Temp Source: Oral (07/17 0813) BP: 130/80 (07/17 0813) Pulse Rate: 60 (07/17 0446)  Labs:  Recent Labs  08/26/16 0811 08/26/16 1421 08/26/16 1820 08/27/16 0112  HGB 11.6*  --   --  11.4*  HCT 35.2*  --   --  35.7*  PLT 175  --   --  182  LABPROT 26.3*  --   --  26.9*  INR 2.37  --   --  2.43  CREATININE 1.55*  --   --  1.98*  TROPONINI 0.11* 0.12* 0.13* 0.13*    Estimated Creatinine Clearance: 28.8 mL/min (A) (by C-G formula based on SCr of 1.98 mg/dL (H)).   Medical History: Past Medical History:  Diagnosis Date  . Atrial fibrillation ([place])   . Coronary artery disease   . Heart disease   . Hyperlipidemia   . Hypertension   . Implantable cardioverter-defibrillator (ICD) in situ   . Pacemaker   . PAF (paroxysmal atrial fibrillation) ([place]) 08/21/2016  . Thyroid disease     Assessment: 83 yo male on warfarin PTA for hx of AFib. INR therapeutic 2.4.  PTA warfarin: 3 mg po on Mon/Fri, 6 mg on all other days   Goal of Therapy:  INR 2-3 Monitor platelets by anticoagulation protocol: Yes    Plan:  -Warfarin 6 mg po x1 -Daily INR   Sadarius Norman, Jake Church 08/27/2016,10:34 AM

## 2016-08-27 NOTE — Progress Notes (Signed)
Progress Note  Patient Name: Kenneth Bell Date of Encounter: 08/27/2016  Primary Cardiologist: Dr. Darrold Span [facility]) - plans to establish with Dr. Ellyn Hack (appt. 8/3)  Subjective   Breathing has improved. Has not ambulated around the unit yet. No recent chest pain or palpitations.   Inpatient Medications    Scheduled Meds: . aspirin EC  81 mg Oral Daily  . furosemide  80 mg Intravenous BID  . levothyroxine  50 mcg Oral QAC breakfast  . mouth rinse  15 mL Mouth Rinse BID  . metoprolol succinate  12.5 mg Oral Daily  . pantoprazole  40 mg Oral Daily  . potassium chloride  10 mEq Oral Daily  . simvastatin  20 mg Oral q1800  . sodium chloride flush  3 mL Intravenous Q12H  . Warfarin - Pharmacist Dosing Inpatient   Does not apply q1800   Continuous Infusions: . sodium chloride     PRN Meds: sodium chloride, acetaminophen, ondansetron (ZOFRAN) IV, sodium chloride flush   Vital Signs    Vitals:   08/27/16 0026 08/27/16 0446 08/27/16 0624 08/27/16 0813  BP: (!) 144/79 (!) 151/78  130/80  Pulse: (!) 56 60    Resp: 18 18    Temp: 97.6 F (36.4 C) (!) 97.4 F (36.3 C) 97.7 F (36.5 C) 98.4 F (36.9 C)  TempSrc: Oral Oral Oral Oral  SpO2: 97% 96%  93%  Weight:  194 lb (88 kg)    Height:        Intake/Output Summary (Last 24 hours) at 08/27/16 0937 Last data filed at 08/27/16 0837  Gross per 24 hour  Intake              960 ml  Output             3350 ml  Net            -2390 ml   Filed Weights   08/26/16 0758 08/26/16 1221 08/27/16 0446  Weight: 201 lb (91.2 kg) 196 lb 7 oz (89.1 kg) 194 lb (88 kg)    Telemetry    V-paced, HR in 60's.  - Personally Reviewed  ECG    No new tracings.   Physical Exam   General: Well developed, well nourished, male appearing in no acute distress. Head: Normocephalic, atraumatic.  Neck: Supple without bruits, JVD at 8cm. Lungs:  Resp regular and unlabored, mildly decreased breath sounds along bases bilaterally. Heart: RRR,  S1, S2, no S3, S4, or murmur; no rub. Abdomen: Soft, non-tender, non-distended with normoactive bowel sounds. No hepatomegaly. No rebound/guarding. No obvious abdominal masses. Extremities: No clubbing, cyanosis, or lower extremity edema. Distal pedal pulses are 2+ bilaterally. Neuro: Alert and oriented X 3. Moves all extremities spontaneously. Psych: Normal affect.  Labs    Chemistry Recent Labs Lab 08/26/16 0811 08/27/16 0112  NA 137 134*  K 3.9 3.6  CL 106 101  CO2 22 26  GLUCOSE 106* 111*  BUN 39* 39*  CREATININE 1.55* 1.98*  CALCIUM 9.7 9.7  PROT 7.1  --   ALBUMIN 3.4*  --   AST 38  --   ALT 22  --   ALKPHOS 85  --   BILITOT 1.0  --   GFRNONAA 38* 28*  GFRAA 44* 33*  ANIONGAP 9 7     Hematology Recent Labs Lab 08/26/16 0811 08/27/16 0112  WBC 4.4 3.6*  RBC 3.71* 3.75*  HGB 11.6* 11.4*  HCT 35.2* 35.7*  MCV 94.9 95.2  MCH 31.3  30.4  MCHC 33.0 31.9  RDW 15.3 15.7*  PLT 175 182    Cardiac Enzymes Recent Labs Lab 08/26/16 0811 08/26/16 1421 08/26/16 1820 08/27/16 0112  TROPONINI 0.11* 0.12* 0.13* 0.13*   No results for input(s): TROPIPOC in the last 168 hours.   BNP Recent Labs Lab 08/26/16 0811  BNP 636.9*     DDimer No results for input(s): DDIMER in the last 168 hours.   Radiology    Dg Chest 2 View  Result Date: 08/26/2016 CLINICAL DATA:  Short of breath for years EXAM: CHEST  2 VIEW COMPARISON:  None. FINDINGS: The heart is mildly enlarged. Postoperative changes from CABG are noted. AICD device of via left subclavian vein is present. Several leads are in place. Vascular congestion. Low volumes. No Kerley B lines. Tiny pleural effusions are suspected. No pneumothorax. Increased AP diameter of the chest. IMPRESSION: Mild volume overload characterized by vascular congestion and small pleural effusions. Lungs are under aerated with bibasilar atelectasis Electronically Signed   By: Marybelle Killings M.D.   On: 08/26/2016 08:36    Cardiac Studies     Echocardiogram: 08/26/2016 Study Conclusions  - Left ventricle: The cavity size was normal. There was moderate   concentric hypertrophy. Systolic function was mildly to   moderately reduced. The estimated ejection fraction was in the   range of 40% to 45%. There is akinesis of the   basal-midinferoseptal myocardium. There is akinesis of the   mid-apicalinferolateral myocardium. - Aortic valve: Moderately thickened, moderately calcified   leaflets. Valve mobility was restricted. Transvalvular velocity   was minimally increased. There was no stenosis. There was mild   regurgitation. - Aorta: Aortic root dimension: 39 mm (ED). - Ascending aorta: The ascending aorta was mildly dilated. - Mitral valve: Calcified annulus. Moderately thickened leaflets .   There was mild regurgitation. - Left atrium: The atrium was moderately dilated. - Right ventricle: The cavity size was moderately dilated. Wall   thickness was normal. Pacer wire or catheter noted in right   ventricle. - Tricuspid valve: There was moderate regurgitation. - Pulmonic valve: There was moderate regurgitation. - Pulmonary arteries: Systolic pressure was moderately increased.   PA peak pressure: 53 mm Hg (S).   Patient Profile     83 y.o. male with PMH of CAD (s/p CABG x 4 in 06/2011 with LIMA to LAD, SVG to OM2, SVG to PDA and SVG to RI, Last cath in 06/2015 indicated widely patent LIMA to LAD, widely patent SVG to obtuse marginal, 40% proximal vessel disease and distal vessel disease of the SVG to PDA, and 100% occlusion of the proximal SVG to ramus intermedius), ischemic cardiomyopathy (s/p bi-V AICD placement), PAF, chronic systolic CHF, HTN, HLD, and prior brain aneurysm (occuring in 2008) who presented for evaluation of worsening dyspnea on exertion.   Assessment & Plan    1. Acute on Chronic Systolic CHF -  EF was 29% by echo in 08/2015. Repeat echo this admission shows an EF of 40-45% with akinesis of the  basal-midinferoseptal myocardium and mid-apicalinferolateral myocardium. - BNP elevated to 636 on admission. Has been started on IV Lasix 80mg  BID with an overall net output of -3.1L with improvement in his respiratory status. He received his AM dose of Lasix. Would hold PM dose and reassess kidney function in the AM with his acute bump in creatinine (1.55 --> 1.98).  - continue Toprol-XL. Not a candidate for ACE-I/ARB/Entresto at this time with his elevated creatinine. Consider the addition of this  prior to discharge if kidney function improves.   2. CAD/ Elevated Troponin  - he has known CAD, s/p CABG x 4 in 06/2011 with LIMA to LAD, SVG to OM2, SVG to PDA and SVG to RI, Last cath in 06/2015 indicated widely patent LIMA to LAD, widely patent SVG to obtuse marginal, 40% proximal vessel disease and distal vessel disease of the SVG to PDA, and 100% occlusion of the proximal SVG to ramus intermedius. - cyclic troponin values have been flat at 0.11, 0.12, and 0.13. No plans for further ischemic evaluation at this time.   3. Ischemic Cardiomyopathy - s/p [employer] Bi-V ICD placement.   4. PAF - This patients CHA2DS2-VASc Score and unadjusted Ischemic Stroke Rate (% per year) is equal to 7.2 % stroke rate/year from a score of 5 (CHF, HTN, Vascular, Age (2)). Remains on Coumadin for anticoagulation. - continue Toprol-XL 12.5mg  daily for rate-control.   5. Acute on Chronic Stage 3 CKD - creatinine at 1.5 by review of Care Everywhere in 02/2016. At 1.55 on admission, increased to 1.98 this AM.   Signed, Erma Heritage , PA-C 9:37 AM 08/27/2016 Pager: [phone]

## 2016-08-27 NOTE — Progress Notes (Signed)
Nutrition Education Note  RD consulted for nutrition education regarding new onset CHF.  Pt reports having prior low sodium education for heart failure. States he tries to avoid using table salt and is aware of which foods contain high amounts of sodium.   RD provided "Low Sodium Nutrition Therapy" handout from the Academy of [employer]. Reviewed patient's dietary recall. Provided examples on ways to decrease sodium intake in diet. Discouraged intake of processed foods and use of salt shaker. Encouraged fresh fruits and vegetables as well as whole grain sources of carbohydrates to maximize fiber intake.   RD discussed why it is important for patient to adhere to diet recommendations, and emphasized the role of fluids, foods to avoid, and importance of weighing self daily. Teach back method used.  Expect fair compliance.  Body mass index is 27.84 kg/m. Pt meets criteria for overweight based on current BMI.  Current diet order is Heart Healthy, patient is consuming approximately 100% of meals at this time. Labs and medications reviewed. No further nutrition interventions warranted at this time. RD contact information provided. If additional nutrition issues arise, please re-consult RD.   Mariana Single RD, LDN Pager # - [phone]

## 2016-08-27 NOTE — Progress Notes (Signed)
No acute events overnight. Patient slept. Denies pain. VS stable. Will continue to monitor.  Lynk Marti, RN

## 2016-08-27 NOTE — Progress Notes (Signed)
Pt educated about safety and importance of bed alarm during the night however pt refuses to be on bed alarm. Will continue to round on patient.   Deral Schellenberg, RN    

## 2016-08-27 NOTE — Progress Notes (Signed)
SATURATION QUALIFICATIONS: (This note is used to comply with regulatory documentation for home oxygen)  Patient Saturations on Room Air at Rest = 98%  Patient Saturations on Room Air while Ambulating = 94%  Patient Saturations on  Liters of oxygen while Ambulating = %  Please briefly explain why patient needs home oxygen:

## 2016-08-27 NOTE — Discharge Instructions (Signed)

## 2016-08-28 ENCOUNTER — Encounter (HOSPITAL_COMMUNITY): Payer: Self-pay | Admitting: Physician Assistant

## 2016-08-28 ENCOUNTER — Ambulatory Visit: Payer: Self-pay | Admitting: Family Medicine

## 2016-08-28 DIAGNOSIS — I350 Nonrheumatic aortic (valve) stenosis: Secondary | ICD-10-CM

## 2016-08-28 DIAGNOSIS — I2721 Secondary pulmonary arterial hypertension: Secondary | ICD-10-CM

## 2016-08-28 DIAGNOSIS — I517 Cardiomegaly: Secondary | ICD-10-CM

## 2016-08-28 DIAGNOSIS — I071 Rheumatic tricuspid insufficiency: Secondary | ICD-10-CM

## 2016-08-28 DIAGNOSIS — I255 Ischemic cardiomyopathy: Secondary | ICD-10-CM

## 2016-08-28 DIAGNOSIS — I351 Nonrheumatic aortic (valve) insufficiency: Secondary | ICD-10-CM | POA: Insufficient documentation

## 2016-08-28 DIAGNOSIS — I371 Nonrheumatic pulmonary valve insufficiency: Secondary | ICD-10-CM

## 2016-08-28 HISTORY — DX: Secondary pulmonary arterial hypertension: I27.21

## 2016-08-28 HISTORY — DX: Nonrheumatic aortic (valve) stenosis: I35.0

## 2016-08-28 LAB — BASIC METABOLIC PANEL
Anion gap: 10 (ref 5–15)
BUN: 41 mg/dL — AB (ref 6–20)
CALCIUM: 9.8 mg/dL (ref 8.9–10.3)
CHLORIDE: 100 mmol/L — AB (ref 101–111)
CO2: 27 mmol/L (ref 22–32)
CREATININE: 1.92 mg/dL — AB (ref 0.61–1.24)
GFR calc Af Amer: 34 mL/min — ABNORMAL LOW (ref >60)
GFR, EST NON AFRICAN AMERICAN: 30 mL/min — AB (ref >60)
Glucose, Bld: 107 mg/dL — ABNORMAL HIGH (ref 65–99)
Potassium: 3.5 mmol/L (ref 3.5–5.1)
SODIUM: 137 mmol/L (ref 135–145)

## 2016-08-28 LAB — PROTIME-INR
INR: 2.31
PROTHROMBIN TIME: 25.8 s — AB (ref 11.4–15.2)

## 2016-08-28 MED ORDER — WARFARIN SODIUM 6 MG PO TABS: ORAL_TABLET | ORAL | Status: DC

## 2016-08-28 MED ORDER — LIVING BETTER WITH HEART FAILURE BOOK
Freq: Once | Status: AC
  Administered 2016-08-28: 1

## 2016-08-28 MED ORDER — ASPIRIN 81 MG PO TBEC: 81.0000 mg | DELAYED_RELEASE_TABLET | Freq: Every day | ORAL | 6 refills | Status: DC

## 2016-08-28 MED ORDER — WARFARIN SODIUM 3 MG PO TABS: 6.0000 mg | ORAL_TABLET | Freq: Once | ORAL | Status: DC

## 2016-08-28 NOTE — Discharge Summary (Signed)
Discharge Summary    Patient ID: Kenneth Bell,  MRN: [id number], DOB/AGE: [birth year] 83 y.o.  Admit date: 08/26/2016 Discharge date: 08/28/2016  Primary Care Provider: [name] Primary Cardiologist: Seen by Dr. Debara Pickett this admission, but plans to establish with Dr. Ellyn Hack 09/13/16  Discharge Diagnoses    Principal Problem:   Acute on chronic systolic heart failure [facility]) Active Problems:   Persistent atrial fibrillation (HCC)   CKD (chronic kidney disease), stage III   CAD (coronary artery disease)   Elevated troponin   Right ventricular dilation   Tricuspid regurgitation   Pulmonary regurgitation   Mild aortic insufficiency   Ischemic cardiomyopathy   Diagnostic Studies/Procedures    2D Echo 08/26/16 Study Conclusions  - Left ventricle: The cavity size was normal. There was moderate   concentric hypertrophy. Systolic function was mildly to   moderately reduced. The estimated ejection fraction was in the   range of 40% to 45%. There is akinesis of the   basal-midinferoseptal myocardium. There is akinesis of the   mid-apicalinferolateral myocardium. - Aortic valve: Moderately thickened, moderately calcified   leaflets. Valve mobility was restricted. Transvalvular velocity   was minimally increased. There was no stenosis. There was mild   regurgitation. - Aorta: Aortic root dimension: 39 mm (ED). - Ascending aorta: The ascending aorta was mildly dilated. - Mitral valve: Calcified annulus. Moderately thickened leaflets .   There was mild regurgitation. - Left atrium: The atrium was moderately dilated. - Right ventricle: The cavity size was moderately dilated. Wall   thickness was normal. Pacer wire or catheter noted in right   ventricle. - Tricuspid valve: There was moderate regurgitation. - Pulmonic valve: There was moderate regurgitation. - Pulmonary arteries: Systolic pressure was moderately increased.   PA peak pressure: 53 mm Hg  (S).  _____________     History of Present Illness     Kenneth Bell is a 83 y.o. male with history of CAD (MI 2002, CABG x 4 in 06/2011 with LIMA to LAD, SVG to OM2, SVG to PDA and SVG to RI), ischemic cardiomyopathy (s/p bi-V AICD placement 2008), PAF, chronic systolic CHF, HTN, HLD, nephrolithasis and prior brain aneurysm (2008), CKD III (baseline ~1.5) who presented for evaluation of worsening dyspnea on exertion.   Last cath in 06/2015 revealed patent left main, diffusely diseased LAD with competitive flow in the distal LAD, 35% mid circumflex disease with occlusion of the first obtuse marginal branch and 90% distal circumflex disease. The second obtuse marginal branch was occluded and this is perfused via a saphenous vein graft. The right coronary artery had a 70% ostial disease segment and 100% occlusion of the distal RCA. Vein grafts indicated widely patent LIMA to LAD, widely patent SVG to obtuse marginal, 40% proximal vessel disease and distal vessel disease of the SVG to PDA, and 100% occlusion of the proximal SVG to ramus intermedius. Last Myoview was in March 2017 which was abnormal demonstrating prior infarction of the lateral wall without any inducible ischemia. EF was 31% with moderate global hypokinesis. Echo in July 2017 demonstrated moderate LV dysfunction with global hypokinesis possibly worse in the anterolateral inferolateral and apical segments. He has a history of biventricular AICD implant [employer]) and paroxysmal atrial fibrillation on chronic oral anticoagulants. Recent office weight in March 2018 when he last saw his cardiologist was 199 pounds.   He presented to [facility] initially with acute onset of SOB, superimposed on several days of DOE and orthonea. BNP was elevated at 637  with a mildly elevated troponin of 0.11. H&H were 11 and 35. Chest x-ray showed mild congestive heart failure with small bilateral pleural effusions and interstitial edema. Creatinine was  near baseline at 1.55. EKG showed Afib with PVC's - BI-V paced rhythm. He required supplemental oxygen. He denied any recent angina. He was felt to have recurrence of acute on chronic systolic CHF and was admitted for diuresis.    Hospital Course     Issues addressed this admission:  1. Acute on chronic systolic CHF/ICM s/p [employer] Sci ICD - 2D Echo 08/26/16 showed EF 40-45%, akinesis of the basal-midinferoseptal myocardium, akinesis of the mid-apicalinferolateral myocardium, mild AI, mildly dilated ascending aorta, mild MR, mod LAE, mod dilated RV, mod TR, mod PR, mod increased PASP 81mmHg. Admit weight not totally clear as there were 2 recorded same day (201 and 196). He diuresed down to 190lb on IV Lasix which is said to be his dry weight. He will be transitioned back to home dose of Bumex. No ACEI/ARB/spiro due to CKD and borderline BPs.   2. CAD/elevated troponin - minimal flat trend 0.11-0.12-0.13-0.13, felt due to demand ischemia, no plans for further ischemic evaluation at this time. Dr. Debara Pickett recommended he start baby aspirin daily.  3. Persistent atrial fib - per Dr. Debara Pickett, Bi-V AICD assessed - he is close to 100% of the time in a-fib for more than 1 year. Anticipate continued rate control strategy. Programmed VVIR. Adequate battery life. Would recommend to establish care with Dr. Sallyanne Kuster since he'll be following with Dr. Ellyn Hack at [facility] office long term. Remains therapeutic on Coumadin. He recently established with [facility] on [address] to f/u his Coumadin level. He stated he had f/u with them in several weeks. I called their office and they do not have this on record - they offered to schedule a Coumadin clinic appointment so I went ahead and scheduled this for 09/09/16 at 11am. The scheduler also said they typically piggyback this with a provider appointment as well. This office is closer to his home in [place] than our [place] office. He was continued on prior home  dose (just updated MAR to be a little more clear as it said both 6mg  daily and then 1/2 tablet twice a week).  4. AKI on CKD stage III - baseline Cr is around 1.5, increased to 1.98 with diuresis. Got one dose of Lasix yesterday instead of two and Cr trending down to 1.92. Will need to follow as OP to determine where new baseline falls. Would recommend to obtain f/u BMET as OP.  5. Other incidental findings on echo including mild AI, mod TR/PR, increased PASP 85mmHg - will follow clinically.  Dr. Debara Pickett recommended he keep his appointment on 09/13/16 as scheduled with Dr. Ellyn Hack to establish care. At that time would recommend getting him set up with device clinic as above to follow his defib. Pt also received education on low sodium diet, 2L fluid restriction and daily weights.  _____________  Discharge Vitals Blood pressure 129/75, pulse 60, temperature 98.3 F (36.8 C), temperature source Oral, resp. rate 18, height 5\' 10"  (1.778 m), weight 190 lb 8 oz (86.4 kg), SpO2 97 %.  Filed Weights   08/26/16 1221 08/27/16 0446 08/28/16 0622  Weight: 196 lb 7 oz (89.1 kg) 194 lb (88 kg) 190 lb 8 oz (86.4 kg)    Labs & Radiologic Studies    CBC  Recent Labs  08/26/16 0811 08/27/16 0112  WBC 4.4 3.6*  NEUTROABS 2.7  --   HGB 11.6* 11.4*  HCT 35.2* 35.7*  MCV 94.9 95.2  PLT 175 373   Basic Metabolic Panel  Recent Labs  08/27/16 0112 08/28/16 0338  NA 134* 137  K 3.6 3.5  CL 101 100*  CO2 26 27  GLUCOSE 111* 107*  BUN 39* 41*  CREATININE 1.98* 1.92*  CALCIUM 9.7 9.8   Liver Function Tests  Recent Labs  08/26/16 0811  AST 38  ALT 22  ALKPHOS 85  BILITOT 1.0  PROT 7.1  ALBUMIN 3.4*   Cardiac Enzymes  Recent Labs  08/26/16 1421 08/26/16 1820 08/27/16 0112  TROPONINI 0.12* 0.13* 0.13*   Thyroid Function Tests  Recent Labs  08/26/16 1421  TSH 3.425   _____________  Dg Chest 2 View  Result Date: 08/26/2016 CLINICAL DATA:  Short of breath for years EXAM:  CHEST  2 VIEW COMPARISON:  None. FINDINGS: The heart is mildly enlarged. Postoperative changes from CABG are noted. AICD device of via left subclavian vein is present. Several leads are in place. Vascular congestion. Low volumes. No Kerley B lines. Tiny pleural effusions are suspected. No pneumothorax. Increased AP diameter of the chest. IMPRESSION: Mild volume overload characterized by vascular congestion and small pleural effusions. Lungs are under aerated with bibasilar atelectasis Electronically Signed   By: Marybelle Killings M.D.   On: 08/26/2016 08:36   Disposition   Pt is being discharged home today in good condition.  Follow-up Plans & Appointments    Follow-up Information    Leonie Man, MD Follow up on 09/13/2016.   Specialty:  Cardiology Why:  Keep follow up as scheduled with Dr. Ellyn Hack below. Arrive 15 minutes prior to appointment time to get registered and checked in. Contact information: [place] [address] [address] [address] at  [facility] Follow up on 09/09/2016.   Specialty:  Family Medicine Why:  09/09/16 at 11am for your Coumadin Clinic appointment - please arrive at 10:45am  Contact information: [address] [place] [phone]         Discharge Instructions    Diet - low sodium heart healthy    Complete by:  As directed    Increase activity slowly    Complete by:  As directed    One of your heart tests showed weakness of the heart muscle this admission. This may make you more susceptible to weight gain from fluid retention, which can lead to symptoms that we call heart failure. Please follow these special instructions:  1. Follow a low-salt diet - you are allowed no more than 2,000mg  of sodium per day. Watch your fluid intake. In general, you should not be taking in more than 2 liters of fluid per day (no more than 8 glasses per day). This includes sources of  water in foods like soup, coffee, tea, milk, etc. 2. Weigh yourself on the same scale at same time of day and keep a log. 3. Call your doctor: (Anytime you feel any of the following symptoms)  - 3lb weight gain overnight or 5lb within a few days - Shortness of breath, with or without a dry hacking cough  - Swelling in the hands, feet or stomach  - If you have to sleep on extra pillows at night in order to breathe   IT IS IMPORTANT TO LET YOUR DOCTOR KNOW EARLY ON IF YOU ARE HAVING  SYMPTOMS SO WE CAN HELP YOU!  If you notice any bleeding such as blood in stool, black tarry stools, blood in urine, nosebleeds or any other unusual bleeding, call your doctor immediately.      Discharge Medications   Allergies as of 08/28/2016      Reactions   Nitroglycerin Other (See Comments)   DROPS BLOOD PRESSURE DRASTICALLY      Medication List    TAKE these medications   aspirin 81 MG EC tablet Take 1 tablet (81 mg total) by mouth daily.   bumetanide 1 MG tablet Commonly known as:  BUMEX Take 2 mg by mouth daily.   diphenhydramine-acetaminophen 25-500 MG Tabs tablet Commonly known as:  TYLENOL PM Take 1 tablet by mouth at bedtime as needed (for sleep).   levothyroxine 50 MCG tablet Commonly known as:  SYNTHROID, LEVOTHROID Take 1 tablet (50 mcg total) by mouth daily.   metoprolol succinate 25 MG 24 hr tablet Commonly known as:  TOPROL-XL Take 12.5 mg by mouth daily.   omeprazole 20 MG capsule Commonly known as:  PRILOSEC Take 20 mg by mouth daily.   potassium chloride 10 MEQ CR capsule Commonly known as:  MICRO-K Take 10 mEq by mouth daily.   simvastatin 20 MG tablet Commonly known as:  ZOCOR Take 20 mg by mouth every evening.   warfarin 6 MG tablet Commonly known as:  COUMADIN CONTINUE SAME HOME DOSE: Take 1/2 tablet by mouth on Mondays and Fridays, and 1 whole tablet per day on all other days. What changed:  how much to take  how to take this  when to take  this  additional instructions        Allergies:  Allergies  Allergen Reactions  . Nitroglycerin Other (See Comments)    DROPS BLOOD PRESSURE DRASTICALLY     Outstanding Labs/Studies   N/a  Duration of Discharge Encounter   Greater than 30 minutes including physician time.  Signed, Charlie Pitter PA-C 08/28/2016, 2:51 PM

## 2016-08-28 NOTE — Evaluation (Signed)
Physical Therapy Evaluation Patient Details Name: Kenneth Bell MRN: [id number] DOB: March 20, 1926 Today's Date: 08/28/2016   History of Present Illness  83 yo male with onset of CHF and diuresed, EF 40-45%.  Has Bi-V AICD in good condition, a-fib chronically. 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 PMHx:  CAD, CKD 3, PAF, CABG x 4, HTN  Clinical Impression  Pt is preparing to get home but did demonstrate a signficant loss of O2 sats with gait on room air.  He is preparing for a transition for his wife, but will need potentially more help for himself in his previous environment.  He is now requiring supplemental O2 unless he can demonstrate tolerance for gait on Room air.  Will request home therapy and will probably need a walker to maintain his mobility.  Have to decide if he needs O2 on walker next visit, but otherwise should be able to use portable O2 if no AD needed.  May require more help to combine walker and O2, and will again ck on this next visit.    Follow Up Recommendations Home health PT;Supervision for mobility/OOB    Equipment Recommendations  Rolling walker with 5" wheels    Recommendations for Other Services       Precautions / Restrictions Precautions Precautions: Fall;ICD/Pacemaker Precaution Comments: AICD in good condition Restrictions Weight Bearing Restrictions: No      Mobility  Bed Mobility Overal bed mobility: Needs Assistance Bed Mobility: Supine to Sit;Sit to Supine     Supine to sit: Min assist;Mod assist Sit to supine: Min assist   General bed mobility comments: mainly helping to scoot out to EOB getting up and minor LE help back to bed  Transfers Overall transfer level: Needs assistance Equipment used: Rolling walker (2 wheeled);1 person hand held assist Transfers: Sit to/from Stand Sit to Stand: Min guard;Min assist         General transfer comment: reminders for his hand placement and  safety  Ambulation/Gait Ambulation/Gait assistance: Min guard Ambulation Distance (Feet): 110 Feet Assistive device: 1 person hand held assist Gait Pattern/deviations: Step-through pattern;Step-to pattern;Decreased stride length;Narrow base of support;Drifts right/left Gait velocity: reduced Gait velocity interpretation: Below normal speed for age/gender General Gait Details: minor unsteadiness but O2 sat dropped to 88% with gait  Stairs            Wheelchair Mobility    Modified Rankin (Stroke Patients Only)       Balance Overall balance assessment: Needs assistance Sitting-balance support: Feet supported Sitting balance-Leahy Scale: Good     Standing balance support: Single extremity supported Standing balance-Leahy Scale: Fair Standing balance comment: fair was less in dynamic situation                             Pertinent Vitals/Pain Pain Assessment: No/denies pain    Home Living Family/patient expects to be discharged to:: Assisted living Living Arrangements: Other (Comment) (ALF) Available Help at Discharge: Other (Comment) (ALF) Type of Home: Assisted living Home Access: Level entry     Home Layout: One level Home Equipment: Walker - 4 wheels;Cane - single point;Shower seat - built in      Prior Function Level of Independence: Independent with assistive device(s)         Comments: had not needed an AD until just recently     Hand Dominance        Extremity/Trunk Assessment   Upper  Extremity Assessment Upper Extremity Assessment: Overall WFL for tasks assessed    Lower Extremity Assessment Lower Extremity Assessment: Overall WFL for tasks assessed    Cervical / Trunk Assessment Cervical / Trunk Assessment: Normal  Communication   Communication: No difficulties  Cognition Arousal/Alertness: Awake/alert Behavior During Therapy: WFL for tasks assessed/performed Overall Cognitive Status: Within Functional Limits for tasks  assessed                                        General Comments General comments (skin integrity, edema, etc.): Pt walks well with HHA but may need to be on RW for safety for home.    Exercises     Assessment/Plan    PT Assessment Patient needs continued PT services  PT Problem List Decreased strength;Decreased range of motion;Decreased activity tolerance;Decreased coordination;Decreased balance;Decreased mobility;Decreased safety awareness;Cardiopulmonary status limiting activity;Decreased knowledge of precautions;Obesity       PT Treatment Interventions DME instruction;Gait training;Functional mobility training;Therapeutic activities;Therapeutic exercise;Balance training;Neuromuscular re-education;Patient/family education    PT Goals (Current goals can be found in the Care Plan section)  Acute Rehab PT Goals Patient Stated Goal: to get home and be helpful to wife PT Goal Formulation: With patient Time For Goal Achievement: 09/11/16 Potential to Achieve Goals: Good    Frequency Min 3X/week   Barriers to discharge Other (comment) (may not have help to supervise gait)      Co-evaluation               AM-PAC PT "6 Clicks" Daily Activity  Outcome Measure Difficulty turning over in bed (including adjusting bedclothes, sheets and blankets)?: A Little Difficulty moving from lying on back to sitting on the side of the bed? : Total Difficulty sitting down on and standing up from a chair with arms (e.g., wheelchair, bedside commode, etc,.)?: Total Help needed moving to and from a bed to chair (including a wheelchair)?: A Little Help needed walking in hospital room?: A Little Help needed climbing 3-5 steps with a railing? : A Lot 6 Click Score: 13    End of Session Equipment Utilized During Treatment: Gait belt Activity Tolerance: Treatment limited secondary to medical complications (Comment) (O2 sats dropped on room air) Patient left: in bed;with call  bell/phone within reach;with bed alarm set (asked to stay in bed) Nurse Communication: Mobility status;Other (comment) (O2 sats) PT Visit Diagnosis: Unsteadiness on feet (R26.81);Muscle weakness (generalized) (M62.81);Difficulty in walking, not elsewhere classified (R26.2)    Time: 2706-2376 PT Time Calculation (min) (ACUTE ONLY): 27 min   Charges:   PT Evaluation $PT Eval Moderate Complexity: 1 Procedure PT Treatments $Gait Training: 8-22 mins   PT G Codes:   PT G-Codes **NOT FOR INPATIENT CLASS** Functional Assessment Tool Used: AM-PAC 6 Clicks Basic Mobility    Ramond Dial 08/28/2016, 1:31 PM   Mee Hives, PT MS Acute Rehab Dept. Number: [place] [facility] [place]

## 2016-08-28 NOTE — Progress Notes (Signed)
Progress Note  Patient Name: Kenneth Bell Date of Encounter: 08/28/2016  Primary Cardiologist: Dr. Darrold Span [facility]) - plans to establish with Dr. Ellyn Hack (appt. 8/3)  Subjective   States he is eager to go home if possible. Does report his breathing isn't totally back to normal but says it hasn't been that way for 3 years and has improved since DC. No chest pain. No edema. About to work with PT.  Inpatient Medications    Scheduled Meds: . aspirin EC  81 mg Oral Daily  . levothyroxine  50 mcg Oral QAC breakfast  . mouth rinse  15 mL Mouth Rinse BID  . metoprolol succinate  12.5 mg Oral Daily  . pantoprazole  40 mg Oral Daily  . potassium chloride  10 mEq Oral Daily  . simvastatin  20 mg Oral q1800  . sodium chloride flush  3 mL Intravenous Q12H  . Warfarin - Pharmacist Dosing Inpatient   Does not apply q1800   Continuous Infusions: . sodium chloride     PRN Meds: sodium chloride, acetaminophen, ondansetron (ZOFRAN) IV, sodium chloride flush   Vital Signs    Vitals:   08/27/16 0813 08/27/16 1143 08/27/16 2011 08/28/16 0622  BP: 130/80 133/76 118/68 137/89  Pulse:  (!) 58 (!) 58 (!) 59  Resp:  20 18 18   Temp: 98.4 F (36.9 C) 98.2 F (36.8 C) 97.9 F (36.6 C) (!) 97.4 F (36.3 C)  TempSrc: Oral Oral Oral Oral  SpO2: 93% 92% 95% 95%  Weight:    190 lb 8 oz (86.4 kg)  Height:        Intake/Output Summary (Last 24 hours) at 08/28/16 1125 Last data filed at 08/28/16 1022  Gross per 24 hour  Intake             1310 ml  Output             2825 ml  Net            -1515 ml   Filed Weights   08/26/16 1221 08/27/16 0446 08/28/16 0622  Weight: 196 lb 7 oz (89.1 kg) 194 lb (88 kg) 190 lb 8 oz (86.4 kg)    Telemetry    Atrial fib V paced - Personally Reviewed  Physical Exam   GEN: No acute distress.  HEENT: Normocephalic, atraumatic, sclera non-icteric. Neck: No JVD or bruits. Cardiac: RRR. 2/6 SEM throughout precordium. No rubs or gallops.  Radials/DP/PT 1+  and equal bilaterally.  Respiratory: Clear to auscultation bilaterally. Breathing is unlabored. GI: Soft, nontender, non-distended, BS +x 4. MS: no deformity. Extremities: No clubbing or cyanosis. No edema. Distal pedal pulses are 2+ and equal bilaterally. Neuro:  AAOx3. Follows commands. Psych:  Responds to questions appropriately with a normal affect.  Labs    Chemistry Recent Labs Lab 08/26/16 0811 08/27/16 0112 08/28/16 0338  NA 137 134* 137  K 3.9 3.6 3.5  CL 106 101 100*  CO2 22 26 27   GLUCOSE 106* 111* 107*  BUN 39* 39* 41*  CREATININE 1.55* 1.98* 1.92*  CALCIUM 9.7 9.7 9.8  PROT 7.1  --   --   ALBUMIN 3.4*  --   --   AST 38  --   --   ALT 22  --   --   ALKPHOS 85  --   --   BILITOT 1.0  --   --   GFRNONAA 38* 28* 30*  GFRAA 44* 33* 34*  ANIONGAP 9 7 10  Hematology Recent Labs Lab 08/26/16 0811 08/27/16 0112  WBC 4.4 3.6*  RBC 3.71* 3.75*  HGB 11.6* 11.4*  HCT 35.2* 35.7*  MCV 94.9 95.2  MCH 31.3 30.4  MCHC 33.0 31.9  RDW 15.3 15.7*  PLT 175 182    Cardiac Enzymes Recent Labs Lab 08/26/16 0811 08/26/16 1421 08/26/16 1820 08/27/16 0112  TROPONINI 0.11* 0.12* 0.13* 0.13*   No results for input(s): TROPIPOC in the last 168 hours.   BNP Recent Labs Lab 08/26/16 0811  BNP 636.9*     DDimer No results for input(s): DDIMER in the last 168 hours.   Radiology    No results found.  Cardiac Studies   2D echo 08/26/16 Study Conclusions  - Left ventricle: The cavity size was normal. There was moderate   concentric hypertrophy. Systolic function was mildly to   moderately reduced. The estimated ejection fraction was in the   range of 40% to 45%. There is akinesis of the   basal-midinferoseptal myocardium. There is akinesis of the   mid-apicalinferolateral myocardium. - Aortic valve: Moderately thickened, moderately calcified   leaflets. Valve mobility was restricted. Transvalvular velocity   was minimally increased. There was no  stenosis. There was mild   regurgitation. - Aorta: Aortic root dimension: 39 mm (ED). - Ascending aorta: The ascending aorta was mildly dilated. - Mitral valve: Calcified annulus. Moderately thickened leaflets .   There was mild regurgitation. - Left atrium: The atrium was moderately dilated. - Right ventricle: The cavity size was moderately dilated. Wall   thickness was normal. Pacer wire or catheter noted in right   ventricle. - Tricuspid valve: There was moderate regurgitation. - Pulmonic valve: There was moderate regurgitation. - Pulmonary arteries: Systolic pressure was moderately increased.   PA peak pressure: 53 mm Hg (S).  Patient Profile     83 y.o. male with CAD (MI 2002, CABG x 4 in 06/2011 with LIMA to LAD, SVG to OM2, SVG to PDA and SVG to RI), ischemic cardiomyopathy (s/p bi-V AICD placement 2008), PAF, chronic systolic CHF, HTN, HLD, nephrolithasis and prior brain aneurysm (2008), CKD III (baseline ~1.5) who presented for evaluation of worsening dyspnea on exertion.   Assessment & Plan    1. Acute on chronic systolic CHF/ICM s/p [place] Sci ICD - patient feels well. Reports dry weight at home around 190, which is current weight. He still diuresed quite a bit yesterday (last dose of Lasix yesterday AM). Consider resuming oral today. PT eval pending.  2. CAD/elevated troponin - felt due to demand ischemia, no plans for further ischemic evaluation at this time.   3. Persistent atrial fib - per Dr. Debara Pickett, Bi-V AICD assessed - he is close to 100% of the time in a-fib for more than 1 year. Anticipate continued rate control strategy. Programmed VVIR. Adequate battery life. Would recommend to establish care with Dr. Sallyanne Kuster since he'll be following with Dr. Ellyn Hack at [facility] office long term. Remains therapeutic on Coumadin.  4. AKI on CKD stage III - plateaued Cr. Will need to follow as OP to determine where new baseline falls. No ACEI/ARB/spiro due to this and borderline BPs.  5.  Other incidental findings on echo including mild AI, mod TR/PR, increased PASP 51mmHg - follow clinically.  Signed, Charlie Pitter, PA-C  08/28/2016, 11:25 AM

## 2016-08-28 NOTE — Evaluation (Signed)
Physical Therapy Evaluation Patient Details Name: Kenneth Bell MRN: [id number] DOB: Nov 15, 1926 Today's Date: 08/28/2016   History of Present Illness  83 yo male with onset of CHF and diuresed, EF 40-45%.  Has Bi-V AICD in good condition, a-fib chronically. 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 PMHx:  CAD, CKD 3, PAF, CABG x 4, HTN  Clinical Impression  Pt is preparing to get home but did demonstrate a signficant loss of O2 sats with gait on room air.  He is preparing for a transition for his wife, but will need potentially more help for himself in his previous environment.  He is now requiring supplemental O2 unless he can demonstrate tolerance for gait on Room air.  Will request home therapy and will probably need a walker to maintain his mobility.  Have to decide if he needs O2 on walker next visit, but otherwise should be able to use portable O2 if no AD needed.  May require more help to combine walker and O2, and will again ck on this next visit.    Follow Up Recommendations Home health PT;Supervision for mobility/OOB    Equipment Recommendations  Rolling walker with 5" wheels    Recommendations for Other Services       Precautions / Restrictions Precautions Precautions: Fall;ICD/Pacemaker Precaution Comments: AICD in good condition Restrictions Weight Bearing Restrictions: No      Mobility  Bed Mobility Overal bed mobility: Needs Assistance Bed Mobility: Supine to Sit;Sit to Supine     Supine to sit: Min assist;Mod assist Sit to supine: Min assist   General bed mobility comments: mainly helping to scoot out to EOB getting up and minor LE help back to bed  Transfers Overall transfer level: Needs assistance Equipment used: Rolling walker (2 wheeled);1 person hand held assist Transfers: Sit to/from Stand Sit to Stand: Min guard;Min assist         General transfer comment: reminders for his hand  placement and safety  Ambulation/Gait Ambulation/Gait assistance: Min guard Ambulation Distance (Feet): 110 Feet Assistive device: 1 person hand held assist Gait Pattern/deviations: Step-through pattern;Step-to pattern;Decreased stride length;Narrow base of support;Drifts right/left Gait velocity: reduced Gait velocity interpretation: Below normal speed for age/gender General Gait Details: minor unsteadiness but O2 sat dropped to 88% with gait  Stairs            Wheelchair Mobility    Modified Rankin (Stroke Patients Only)       Balance Overall balance assessment: Needs assistance Sitting-balance support: Feet supported Sitting balance-Leahy Scale: Good     Standing balance support: Single extremity supported Standing balance-Leahy Scale: Fair Standing balance comment: fair was less in dynamic situation                             Pertinent Vitals/Pain Pain Assessment: No/denies pain    Home Living Family/patient expects to be discharged to:: Private residence Living Arrangements: Children Available Help at Discharge: Family;Available PRN/intermittently Type of Home: House Home Access: Level entry     Home Layout: One level Home Equipment: Walker - 4 wheels;Cane - single point;Shower seat - built in      Prior Function Level of Independence: Independent with assistive device(s)         Comments: had not needed an AD until just recently     Hand Dominance   Dominant Hand: Right    Extremity/Trunk Assessment  Upper Extremity Assessment Upper Extremity Assessment: Overall WFL for tasks assessed    Lower Extremity Assessment Lower Extremity Assessment: Overall WFL for tasks assessed    Cervical / Trunk Assessment Cervical / Trunk Assessment: Normal  Communication   Communication: No difficulties  Cognition Arousal/Alertness: Awake/alert Behavior During Therapy: WFL for tasks assessed/performed Overall Cognitive Status: Within  Functional Limits for tasks assessed                                        General Comments General comments (skin integrity, edema, etc.): Pt walks well with HHA but may need to be on RW for safety for home.    Exercises     Assessment/Plan    PT Assessment Patient needs continued PT services  PT Problem List Decreased strength;Decreased range of motion;Decreased activity tolerance;Decreased coordination;Decreased balance;Decreased mobility;Decreased safety awareness;Cardiopulmonary status limiting activity;Decreased knowledge of precautions;Obesity       PT Treatment Interventions DME instruction;Gait training;Functional mobility training;Therapeutic activities;Therapeutic exercise;Balance training;Neuromuscular re-education;Patient/family education    PT Goals (Current goals can be found in the Care Plan section)  Acute Rehab PT Goals Patient Stated Goal: to get home and be helpful to wife PT Goal Formulation: With patient Time For Goal Achievement: 09/11/16 Potential to Achieve Goals: Good    Frequency Min 3X/week   Barriers to discharge Other (comment) (may not have help to supervise gait)      Co-evaluation               AM-PAC PT "6 Clicks" Daily Activity  Outcome Measure Difficulty turning over in bed (including adjusting bedclothes, sheets and blankets)?: A Little Difficulty moving from lying on back to sitting on the side of the bed? : Total Difficulty sitting down on and standing up from a chair with arms (e.g., wheelchair, bedside commode, etc,.)?: Total Help needed moving to and from a bed to chair (including a wheelchair)?: A Little Help needed walking in hospital room?: A Little Help needed climbing 3-5 steps with a railing? : A Lot 6 Click Score: 13    End of Session Equipment Utilized During Treatment: Gait belt Activity Tolerance: Treatment limited secondary to medical complications (Comment) (O2 sats dropped on room air) Patient  left: in bed;with call bell/phone within reach;with bed alarm set (asked to stay in bed) Nurse Communication: Mobility status;Other (comment) (O2 sats) PT Visit Diagnosis: Unsteadiness on feet (R26.81);Muscle weakness (generalized) (M62.81);Difficulty in walking, not elsewhere classified (R26.2)    Time: 4481-8563 PT Time Calculation (min) (ACUTE ONLY): 27 min   Charges:   PT Evaluation $PT Eval Moderate Complexity: 1 Procedure PT Treatments $Gait Training: 8-22 mins   PT G Codes:   PT G-Codes **NOT FOR INPATIENT CLASS** Functional Assessment Tool Used: AM-PAC 6 Clicks Basic Mobility    Ramond Dial 08/28/2016, 1:47 PM   Mee Hives, PT MS Acute Rehab Dept. Number: [place] and [place]

## 2016-08-28 NOTE — Progress Notes (Signed)
[place] for warfarin Indication: AFib Allergies  Allergen Reactions  . Nitroglycerin Other (See Comments)    DROPS BLOOD PRESSURE DRASTICALLY    Patient Measurements: Height: 5\' 10"  (177.8 cm) Weight: 190 lb 8 oz (86.4 kg) IBW/kg (Calculated) : 73   Vital Signs: Temp: 97.4 F (36.3 C) (07/18 0622) Temp Source: Oral (07/18 0622) BP: 137/89 (07/18 0622) Pulse Rate: 59 (07/18 0622)  Labs:  Recent Labs  08/26/16 0811 08/26/16 1421 08/26/16 1820 08/27/16 0112 08/28/16 0338  HGB 11.6*  --   --  11.4*  --   HCT 35.2*  --   --  35.7*  --   PLT 175  --   --  182  --   LABPROT 26.3*  --   --  26.9*  --   INR 2.37  --   --  2.43  --   CREATININE 1.55*  --   --  1.98* 1.92*  TROPONINI 0.11* 0.12* 0.13* 0.13*  --     Estimated Creatinine Clearance: 27.5 mL/min (A) (by C-G formula based on SCr of 1.92 mg/dL (H)).   Medical History: Past Medical History:  Diagnosis Date  . Atrial fibrillation ([place])   . Coronary artery disease   . Heart disease   . Hyperlipidemia   . Hypertension   . Implantable cardioverter-defibrillator (ICD) in situ   . Pacemaker   . PAF (paroxysmal atrial fibrillation) ([place]) 08/21/2016  . Thyroid disease     Assessment: 83 yo male on warfarin PTA for hx of AFib. INR therapeutic 2.3.  PTA warfarin: 3 mg po on Mon/Fri, 6 mg on all other days   Goal of Therapy:  INR 2-3 Monitor platelets by anticoagulation protocol: Yes    Plan:  -Warfarin 6 mg po x1 -Daily INR   Harvel Quale 08/28/2016,8:34 AM

## 2016-08-28 NOTE — Progress Notes (Signed)
Pt educated on safety and Research scientist (physical sciences).  Pt continues to refuse bed/chair alarm.

## 2016-08-28 NOTE — Progress Notes (Signed)
Patient with no complaints or concerns during 7pm - 7am shift.  Shabria Egley, RN 

## 2016-08-28 NOTE — Progress Notes (Signed)
Orders received for pt discharge.  Discharge summary printed and reviewed with pt.  Explained medication regimen, gave Living Well with Heart Failure booklet, and pt had no further questions at this time.  IV removed and site remains clean, dry, intact.  Telemetry removed.  Pt in stable condition and awaiting transport.

## 2016-08-29 ENCOUNTER — Telehealth: Payer: Self-pay

## 2016-08-29 NOTE — Telephone Encounter (Signed)
Hospital Follow Up call placed. Left message for patient to return call.

## 2016-08-29 NOTE — Telephone Encounter (Signed)
08/29/16  TCM Hospital Follow Up  Transition Care Management Follow-up Telephone Call  ADMISSION DATE:  08/26/16  DISCHARGE DATE: 08/28/16   How have you been since you were released from the hospital?  Patient states he has been SOB but better than before he went to hospital.  Do you understand why you were in the hospital?  Yes   Do you understand the discharge instrcutions? Yes    Items Reviewed:  Medications reviewed: Yes   Allergies reviewed: Yes   Dietary changes reviewed: Low Sodium Heart Healthy   Referrals reviewed: N. Nani Ravens -PCP, Roni Bread -Cardiology   Functional Questionnaire:   Activities of Daily Living (ADLs): Patitn states he is able to perform.  Any patient concerns? Patient states he needs to gain strengtht and improve SOB. Offered appointment sooner states he can wait. Advised to call back if he feels he needs to be seen sooner.   Confirmed importance and date/time of follow-up visits scheduled:Yes Confirmed with patient if condition begins to worsen call PCP or go to the ER. Yes and patient agreed to do so.   Patient was given the office number and encouragred to call back with questions or concerns. Yes

## 2016-08-30 ENCOUNTER — Telehealth: Payer: Self-pay | Admitting: *Deleted

## 2016-08-30 NOTE — Telephone Encounter (Signed)
Received Medical records from [facility], forwarded to provider/SLS 07/20

## 2016-09-04 DIAGNOSIS — C61 Malignant neoplasm of prostate: Secondary | ICD-10-CM | POA: Diagnosis not present

## 2016-09-09 ENCOUNTER — Ambulatory Visit (INDEPENDENT_AMBULATORY_CARE_PROVIDER_SITE_OTHER): Payer: Medicare Other | Admitting: Family Medicine

## 2016-09-09 ENCOUNTER — Encounter: Payer: Self-pay | Admitting: Family Medicine

## 2016-09-09 VITALS — BP 120/58 | HR 68 | Temp 97.7°F | Ht 70.0 in | Wt 196.4 lb

## 2016-09-09 DIAGNOSIS — I509 Heart failure, unspecified: Secondary | ICD-10-CM

## 2016-09-09 DIAGNOSIS — Z7901 Long term (current) use of anticoagulants: Secondary | ICD-10-CM

## 2016-09-09 DIAGNOSIS — Z5181 Encounter for therapeutic drug level monitoring: Secondary | ICD-10-CM

## 2016-09-09 DIAGNOSIS — Z09 Encounter for follow-up examination after completed treatment for conditions other than malignant neoplasm: Secondary | ICD-10-CM

## 2016-09-09 LAB — CBC
HEMATOCRIT: 38.5 % — AB (ref 39.0–52.0)
Hemoglobin: 12.8 g/dL — ABNORMAL LOW (ref 13.0–17.0)
MCHC: 33.1 g/dL (ref 30.0–36.0)
MCV: 95.1 fl (ref 78.0–100.0)
Platelets: 225 10*3/uL (ref 150.0–400.0)
RBC: 4.05 Mil/uL — ABNORMAL LOW (ref 4.22–5.81)
RDW: 15.5 % (ref 11.5–15.5)
WBC: 3.8 10*3/uL — AB (ref 4.0–10.5)

## 2016-09-09 LAB — BASIC METABOLIC PANEL
BUN: 41 mg/dL — ABNORMAL HIGH (ref 6–23)
CALCIUM: 10.2 mg/dL (ref 8.4–10.5)
CO2: 28 mEq/L (ref 19–32)
Chloride: 102 mEq/L (ref 96–112)
Creatinine, Ser: 1.81 mg/dL — ABNORMAL HIGH (ref 0.40–1.50)
GFR: 37.75 mL/min — AB (ref >60.00)
Glucose, Bld: 89 mg/dL (ref 70–99)
Potassium: 4 mEq/L (ref 3.5–5.1)
SODIUM: 138 meq/L (ref 135–145)

## 2016-09-09 LAB — POCT INR: INR: 3.1

## 2016-09-09 NOTE — Progress Notes (Addendum)
Chief Complaint  Patient presents with  . Hospitalization Follow-up    pt states he is still having trouble breathing    HPI Kenneth Bell is a 83 y.o. y.o. male who presents for a transition of care visit.  Pt was discharged from [facility] on 08/28/16.  Within 48 business hours of discharge our office contacted him via telephone to coordinate his care and/ needs.   The patient was admitted on 7/16/818 and discharged in 08/28/16 for CHF exacerbation. He has a follow-up with his cardiologist on 09/13/16. He did diurese and is currently on Bumex. His weight has been stable and he is not having any chest pain. The patient is adhering to a low sodium diet. He continues to have shortness of breath on exertion and at rest. He is not having any cough or fever.  Social History   Social History  . Marital status: Married   Social History Main Topics  . Smoking status: Former Smoker    Types: Cigarettes    Quit date: 05/30/1967  . Smokeless tobacco: Never Used  . Alcohol use 3.6 oz/week    6 Glasses of wine per week  . Drug use: No   Past Medical History:  Diagnosis Date  . Chronic systolic CHF (congestive heart failure) ([place])   . CKD (chronic kidney disease), stage III 08/26/2016  . Coronary artery disease    a. MI 2002. b. s/p CABG in 2013.  [place] Kitchen Hyperlipidemia   . Hypertension   . Implantable cardioverter-defibrillator (ICD) in situ   . Mild aortic insufficiency 08/28/2016  . Moderate pulmonic regurgitation and right ventricular dilation by prior echocardiogram 08/28/2016  . Moderate tricuspid regurgitation 08/28/2016  . Pacemaker   . Persistent atrial fibrillation ([place])   . Right ventricular dilation 08/28/2016  . Thyroid disease    Family History  Problem Relation Age of Onset  . Arthritis Mother   . Heart disease Father    Allergies as of 09/09/2016      Reactions   Nitroglycerin Other (See Comments)   DROPS BLOOD PRESSURE DRASTICALLY      Medication List       Accurate as of  09/09/16  1:15 PM. Always use your most recent med list.          aspirin 81 MG EC tablet Take 1 tablet (81 mg total) by mouth daily.   bumetanide 1 MG tablet Commonly known as:  BUMEX Take 2 mg by mouth daily.   diphenhydramine-acetaminophen 25-500 MG Tabs tablet Commonly known as:  TYLENOL PM Take 1 tablet by mouth at bedtime as needed (for sleep).   levothyroxine 50 MCG tablet Commonly known as:  SYNTHROID, LEVOTHROID Take 1 tablet (50 mcg total) by mouth daily.   metoprolol succinate 25 MG 24 hr tablet Commonly known as:  TOPROL-XL Take 12.5 mg by mouth daily.   omeprazole 20 MG capsule Commonly known as:  PRILOSEC Take 20 mg by mouth daily.   potassium chloride 10 MEQ CR capsule Commonly known as:  MICRO-K Take 10 mEq by mouth daily.   simvastatin 20 MG tablet Commonly known as:  ZOCOR Take 20 mg by mouth every evening.   warfarin 6 MG tablet Commonly known as:  COUMADIN CONTINUE SAME HOME DOSE: Take 1/2 tablet by mouth on Mondays and Fridays, and 1 whole tablet per day on all other days.       ROS:  Constitutional: No fevers or chills, no weight loss HEENT: No headaches, hearing loss, or runny nose, no  sore throat Heart: No chest pain Lungs: +SOB, no cough Abd: No bowel changes, no pain, no N/V GU: No urinary complaints Neuro: No numbness, tingling or weakness Msk: No joint or muscle pain  Objective BP (!) 120/58 (BP Location: Left Arm, Patient Position: Sitting, Cuff Size: Normal)   Pulse 68   Temp 97.7 F (36.5 C) (Oral)   Ht 5\' 10"  (1.778 m)   Wt 196 lb 6.4 oz (89.1 kg)   SpO2 97%   BMI 28.18 kg/m  General Appearance:  awake, alert, oriented, in no acute distress and well developed, well nourished Skin:  there are no suspicious lesions or rashes of concern Head/face:  NCAT Eyes:  EOMI, PERRLA Nose/Sinuses:  negative Mouth/Throat:  Mucosa moist, no lesions; pharynx without erythema, edema or exudate. Neck:  neck- supple, no mass, non-tender  and no jvd Lungs: Clear to auscultation.  No rales, rhonchi, or wheezing. Normal effort, no accessory muscle use. Heart:  SEM heard loudest at tricuspid listening post, Reg rate, irreg irreg rhythm. No bruits. No LE edema Musculoskeletal:  No muscle group atrophy or asymmetry, gait normal Neurologic:  Alert and oriented x 3, gait normal, strength and sensation grossly normal Psych exam: Nml mood and affect, age appropriate judgment and insight  Hospital discharge follow-up - Plan: CBC, Basic metabolic panel  Anticoagulated on Coumadin - Plan: POCT INR  Chronic congestive heart failure, unspecified heart failure type [facility])  Discharge summary and medication list have been reviewed/reconciled.  Labs pending at the time of discharge have been reviewed or are still pending at the time of this visit.  Follow-up labs and appointments have been ordered and/or coordinated appropriately. Educational materials regarding the patient's admitting diagnosis provided.  TRANSITIONAL CARE MANAGEMENT CERTIFICATION:  I certify the following are true:   1. Communication with the patient/care giver was made within 2 business days of discharge.  2. Complexity of Medical decision making is moderate.  3. Face to face visit occurred within 14 days of discharge.   Cardiologist likely to start [facility]. F/u if still having SOB after cardiology visit with no ID'able cause from heart. The patient voiced understanding and agreement to the plan.  [place], DO 09/09/16 1:15 PM

## 2016-09-09 NOTE — Patient Instructions (Addendum)
Let Kenneth Bell know if you need anything.  If Dr. Ellyn Hack doesn't find anything wrong with your heart that could be contributing to your shortness of breath, schedule an appointment with Kenneth Bell again.

## 2016-09-13 ENCOUNTER — Encounter: Payer: Self-pay | Admitting: Cardiology

## 2016-09-13 ENCOUNTER — Ambulatory Visit (INDEPENDENT_AMBULATORY_CARE_PROVIDER_SITE_OTHER): Payer: Medicare Other | Admitting: Cardiology

## 2016-09-13 VITALS — BP 121/69 | HR 83 | Ht 70.0 in | Wt 195.6 lb

## 2016-09-13 DIAGNOSIS — I5041 Acute combined systolic (congestive) and diastolic (congestive) heart failure: Secondary | ICD-10-CM

## 2016-09-13 DIAGNOSIS — Z95810 Presence of automatic (implantable) cardiac defibrillator: Secondary | ICD-10-CM

## 2016-09-13 DIAGNOSIS — I255 Ischemic cardiomyopathy: Secondary | ICD-10-CM

## 2016-09-13 DIAGNOSIS — I481 Persistent atrial fibrillation: Secondary | ICD-10-CM

## 2016-09-13 DIAGNOSIS — N183 Chronic kidney disease, stage 3 unspecified: Secondary | ICD-10-CM

## 2016-09-13 DIAGNOSIS — I2510 Atherosclerotic heart disease of native coronary artery without angina pectoris: Secondary | ICD-10-CM | POA: Diagnosis not present

## 2016-09-13 DIAGNOSIS — I350 Nonrheumatic aortic (valve) stenosis: Secondary | ICD-10-CM | POA: Insufficient documentation

## 2016-09-13 DIAGNOSIS — I4819 Other persistent atrial fibrillation: Secondary | ICD-10-CM

## 2016-09-13 NOTE — Progress Notes (Signed)
PCP: [name], DO  Clinic Note: Chief Complaint  Patient presents with  . Hospitalization Follow-up    To establish cardiology care: CAD, CABG, cardiomyopathy with chronic CHF, A. fib, ICD  . Shortness of Breath    constantly.  . Dizziness    occasionally.    HPI: Kenneth Bell is a 83 y.o. male with a PMH below who presents today for Hospital follow-up and reestablishment of cardiology care. He is a former patient of Kenneth Brooms, MD from [facility]. He is a long history of coronary disease   MI 2002  -while living in [place]  PCI to LAD, OM1 and OM 2 in 2005; had catheterization in 2008. PCI in 2011.  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  Last cardiac cath was in May 2017: Left main patent. Diffuse LAD disease with competitive flow and distal LAD from LIMA. Mid Cx 35% with occlusion of OM 1 and 90% dCx. OM 2 occluded (perfuse via SVG). Ostial RCA 70%, dRCA 100%.  Patent LIMA-LAD. Widely patent SVG-OM, SVG-PDA: 40% proximal. 100% proximal SVG-RI  Most recent Myoview in March 2017: Demonstrated prior infarct and lateral wall without inducible ischemia. EF 31% with moderate global HK.  Echo July 2017: Moderate LV dysfunction with global HK. Possibly worse in the anterolateral and inferolateral/apical segments.  Chronic Combined Systolic and Diastolic CHF  --> baseline weight is usually 100 pounds. She has not been on ACE inhibitor or ARB secondary to hypotension and chronic kidney disease.  PAF  (appears to be maybe more persistent)-> on warfarin and Toprol  Has history of apparently pleural effusion status post thoracentesis. Recent office weight in March 2018 when he last saw his Dr. Darrold Span was 199 pounds.  His PCP recently changed Toprol-XL from 12.5-25 mg daily for hypertension.  Kenneth Bell was seen at [facility] admitted for acute heart  failure exacerbation by Dr. Debara Pickett. This was on 08/26/2016 He noted exertional dyspnea orthopnea and NP was 637 and he had mild troponin elevation. He was noted to be A. fib with PVCs by V paced rhythm. No angina. -- He was diuresed with IV Lasix and transitioned back to by mouth Bumex. - Known to be roughly 100% paced VVIR -- needs to be followed up by pacemaker Dr. (roughly Dr. Sallyanne Kuster) - He is on warfarin.  Studies Personally Reviewed - (if available, images/films reviewed: From Epic Chart or Care Everywhere - see addendum below)  2-D Echo July 2018:: EF 40-45% with akinesis with basal mid inferoseptal and mid-apical inferolateral wall. Aortic sclerosis without stenosis but mild restriction of mobility. Moderate LA dilation. Moderate RV dilation with pacer wires in place. Moderate MR and PR. Elevated PA pressures of 53 mmHg.  Interval History: Kenneth Bell returns today for hospital follow-up stating that he is short of breath as he always is. He still feels short of breath and weak. He would like to do some home health work. He denies any PND or orthopnea (he sleeps on his side), and states his weight is at his previous baseline weight. He denies any chest tightness or pressure with rest or exertion. He does have mild edema but this is stable for him. He denies any sensation of rapid irregular heartbeat or palpitations. No syncope or near-syncope, but does get lightheaded when he first stands up. His PCP recently increase his beta blocker dose  for hypertension, but he doesn't notice any worsening orthostatic symptoms. No TIA or amaurosis fugax symptoms. No sense of any event from his ICD. He is not aware for the release paced or not.  No melena, hematochezia hematuria.  ROS: A comprehensive was performed. Review of Systems  Constitutional: Positive for malaise/fatigue.  HENT: Positive for hearing loss.   Respiratory: Positive for shortness of breath (Chronic) and wheezing (Occasional). Negative for  cough.   Gastrointestinal: Negative for abdominal pain, blood in stool, heartburn and melena.  Genitourinary: Negative for hematuria.  Musculoskeletal: Positive for joint pain. Negative for falls.  Neurological: Positive for dizziness (Orthostatic) and weakness (Generalized weakness.).  Endo/Heme/Allergies: Bruises/bleeds easily.  Psychiatric/Behavioral: Negative for memory loss. The patient is nervous/anxious (Simply be in new area, and without personal transportation). The patient does not have insomnia.   All other systems reviewed and are negative.  I have reviewed and (if needed) personally updated the patient's problem list, medications, allergies, past medical and surgical history, social and family history.   Past Medical History:  Diagnosis Date  . Chronic combined systolic and diastolic heart failure, NYHA class 3 (HCC)    EF is very from report report. Myoview 2007 showed EF 30%, echo in 2016 7 EF 60-65%.  . CKD (chronic kidney disease), stage III 08/26/2016  . COPD (chronic obstructive pulmonary disease) ([place])   . Coronary artery disease involving native heart with angina pectoris ([place])    a. MI 2002 - PCI to [place]. b. 2005: PCI to LAD, OM1 & OM2.  c. s/p CABG in 2013: LIMA to LAD, SVG to OM2, SVG to PDA and SVG to RI. ; d. Cath 2017: occluded LAD, OM1 & OM2, SVG-RI with patent LIMA-LAD, SVG-rPDA & SVG-OM2.   . Hyperlipidemia   . Hypertension   . Implantable cardioverter-defibrillator (ICD) in situ   . Mild aortic insufficiency 08/28/2016  . Moderate pulmonic regurgitation and right ventricular dilation by prior echocardiogram 08/28/2016  . Persistent atrial fibrillation ([place])   . Presence of biventricular implantable cardioverter-defibrillator (ICD) 2008  . Thyroid disease   CKD III   Past Surgical History:  Procedure Laterality Date  . APPENDECTOMY    . CARDIAC CATHETERIZATION  06/2015   Left main patent. Diffuse LAD disease with competitive flow and distal LAD from LIMA.  Mid Cx 35% with occlusion of OM 1 and 90% dCx. OM 2 occluded (perfuse via SVG). Ostial RCA 70%, dRCA 100%.  Patent LIMA-LAD. Widely patent SVG-OM, SVG-PDA: 40% proximal. 100% proximal SVG-RI  . CARDIAC SURGERY    . CARPAL TUNNEL RELEASE Right 2017  . CORONARY ANGIOPLASTY WITH STENT PLACEMENT     Prior PCI to OM 2; 2005 PCI to LAD and OM1 with a Taxus DES 2.5 x 16 mm (postdilated to 3.8 mm) and PTCA of OM 2 stent  . CORONARY ARTERY BYPASS GRAFT  2013   LIMA-LAD, SVG-L1-2, SVG-PDA, SVG-RI (known occlusion of SVG RI)  . KNEE SURGERY Bilateral 1999  . NM MYOVIEW LTD  04/2015   Prior lateral infarct with no scanning. EF 31% with moderate global HK  . SHOULDER SURGERY Bilateral    Twice  . TRANSTHORACIC ECHOCARDIOGRAM  08/2015   Moderate LV dysfunction with global HK (estimated at 40 and 45%). Worse anterolateral and inferolateral wall.  . TRANSTHORACIC ECHOCARDIOGRAM  08/2016   EF 40-45%. Moderate concentric LVH. Akinesis of basal-mid inferoseptal and apical inferolateral wall.  Aortic sclerosis, no stenosis. Moderate mitral thickening with mild MR. Moderate RV dilation. Moderate TR and PR.  PA pressures 53 mmHg  . VEIN BYPASS SURGERY  2013   See addendum note for additional reports    Current Meds  Medication Sig  . aspirin EC 81 MG EC tablet Take 1 tablet (81 mg total) by mouth daily.  . bumetanide (BUMEX) 1 MG tablet Take 2 mg by mouth daily.   . diphenhydramine-acetaminophen (TYLENOL PM) 25-500 MG TABS tablet Take 1 tablet by mouth at bedtime as needed (for sleep).  Marland Kitchen levothyroxine (SYNTHROID, LEVOTHROID) 50 MCG tablet Take 1 tablet (50 mcg total) by mouth daily.  . metoprolol succinate (TOPROL-XL) 25 MG 24 hr tablet Take 12.5 mg by mouth daily.  Marland Kitchen omeprazole (PRILOSEC) 20 MG capsule Take 20 mg by mouth daily.  . potassium chloride (MICRO-K) 10 MEQ CR capsule Take 10 mEq by mouth daily.  . simvastatin (ZOCOR) 20 MG tablet Take 20 mg by mouth every evening.   . warfarin (COUMADIN) 6 MG  tablet CONTINUE SAME HOME DOSE: Take 1/2 tablet by mouth on Mondays and Fridays, and 1 whole tablet per day on all other days.    Allergies  Allergen Reactions  . Nitroglycerin Other (See Comments)    DROPS BLOOD PRESSURE DRASTICALLY    Social History   Social History  . Marital status: Married    Spouse name: N/A  . Number of children: N/A  . Years of education: N/A   Social History Main Topics  . Smoking status: Former Smoker    Types: Cigarettes    Quit date: 05/30/1967  . Smokeless tobacco: Never Used  . Alcohol use 3.6 oz/week    6 Glasses of wine per week  . Drug use: No  . Sexual activity: Not Asked   Other Topics Concern  . None   Social History Narrative   He has been married for 66 years. They have 5 children and 10 grandchildren. 12 great-grandchildren.   He recently moved to [facility], [place] and oriented close to his daughter here. His wife has been moved into an Alzheimer's/memory unit nearby.   He currently lives with his daughter.   He has a college degree and previously worked as a self-employed Materials engineer.   He himself quit driving couple years ago because he was concerned about his reaction time. He is therefore depend upon his family or public transportation.   He quit smoking in 1969 having smoked for 20 years.   He drinks maybe 4-7 drinks alcohol/wine a week.   He does not exercise any longer because of shortness of breath and lack of energy.    family history includes Arthritis in his mother; Heart disease in his father.  Wt Readings from Last 3 Encounters:  09/13/16 195 lb 9.6 oz (88.7 kg)  09/09/16 196 lb 6.4 oz (89.1 kg)  08/28/16 190 lb 8 oz (86.4 kg)    PHYSICAL EXAM BP 121/69   Pulse 83   Ht 5\' 10"  (1.778 m)   Wt 195 lb 9.6 oz (88.7 kg)   BMI 28.07 kg/m  Physical Exam  Constitutional: He is oriented to person, place, and time. He appears well-developed and well-nourished. No distress.  Appears his stated age. Seems a bit  tired  HENT:  Head: Normocephalic and atraumatic.  Mouth/Throat: No oropharyngeal exudate.  Eyes: Pupils are equal, round, and reactive to light. Conjunctivae and EOM are normal. Right eye exhibits no discharge. Left eye exhibits no discharge. No scleral icterus.  Neck: Normal range of motion. Neck supple. No JVD present.  Cardiovascular: Normal  rate, regular rhythm, intact distal pulses and normal pulses.  Exam reveals no gallop and no friction rub.   Murmur heard.  Low-pitched crescendo-decrescendo early systolic murmur is present with a grade of 2/6  at the upper right sternal border radiating to the neck Pulmonary/Chest: Effort normal. No accessory muscle usage. No respiratory distress. He has decreased breath sounds (Diffusely decreased breath sounds with mild interstitial sounds. No obvious rales or rhonchi. Mild occasional wheeze).  Abdominal: Soft. Bowel sounds are normal. He exhibits no distension. There is no tenderness. There is no rebound.  Musculoskeletal: Normal range of motion. He exhibits edema (Trivial).  Neurological: He is alert and oriented to person, place, and time. No cranial nerve deficit.  Skin: Skin is warm and dry. No rash noted. No erythema.  Psychiatric: He has a normal mood and affect. His behavior is normal. Judgment and thought content normal.    Adult ECG Report  Rate: 63 ;  Rhythm: Mostly ventricular paced with PVCs. (Appears to be by the pacer). Unable to interpret underlying rhythm, likely A. fib;   Narrative Interpretation: No notable change from July   Other studies Reviewed: Additional studies/ records that were reviewed today include:  Recent Labs:   Lab Results  Component Value Date   CREATININE 1.81 (H) 09/09/2016   BUN 41 (H) 09/09/2016   NA 138 09/09/2016   K 4.0 09/09/2016   CL 102 09/09/2016   CO2 28 09/09/2016   No results found for: CHOL, HDL, LDLCALC, LDLDIRECT, TRIG, CHOLHDL   ASSESSMENT / PLAN: Problem List Items Addressed This  Visit    Acute combined systolic and diastolic heart failure, NYHA class 3 (HCC) - Primary (Chronic)    Recent exacerbation of chronic combined heart failure. He appears to be down his baseline weight. He just is somewhat weak from his recent hospitalization. He thinks he may have gotten a little bit out of sorts with his medications. I agree with higher dose of Toprol. Apparently he has had issues with hypotension and renal insufficiency on ACE inhibitor or ARB's, therefore we'll hold off for now. Would like to consider trying low-dose ARB in the future however we will hold off for now. Could also consider spironolactone. He is on standing dose of Bumex which is been doing well with. We discussed sliding scale Bumex. (See patient instructions)  We will arrange for home health assistance for evaluation to see if he would benefit from physical therapy. We will also try to consider cardiac rehabilitation for heart failure      Relevant Orders   Ambulatory referral to [place]   Biventricular ICD (implantable cardioverter-defibrillator) in place (Chronic)    He will need to be referred to our pacemaker management team. We will try to have that done here at the [facility] office.      Relevant Orders   EKG 12-Lead (Completed)   Ambulatory referral to Home Health   CAD s/p CABG (Chronic)    No active anginal symptoms. It appears that his EF is stable. Last cath was just over a year ago. He is not on Plavix because of warfarin. He is on aspirin and statin as well as beta blocker. HisPCP is managing his lipids.      Relevant Orders   Ambulatory referral to Home Health   CKD (chronic kidney disease), stage III (Chronic)   Relevant Orders   EKG 12-Lead (Completed)   Ambulatory referral to Home Health   Ischemic cardiomyopathy (Chronic)    Really only  on Toprol as he has not been on ACE inhibitor or ARB as per renal insufficiency and hypotension. We could consider spironolactone which would  complement Bumex. However hasn't ischemic no him about would like to make gradual adjustments.      Relevant Orders   Ambulatory referral to [place]   Persistent atrial fibrillation ([place]) (Chronic)    Relatively a symptomatically for A. fib as he is mostly paced. In the past they've opted for rate control as well as rhythm control. On warfarin for anticoagulation.  This patients CHA2DS2-VASc Score and unadjusted Ischemic Stroke Rate (% per year) is equal to 4.8 % stroke rate/year from a score of 4  Above score calculated as 1 point each if present [CHF, HTN, DM, Vascular=MI/PAD/Aortic Plaque, Age if 65-74, or Male] Above score calculated as 2 points each if present [Age > 75, or Stroke/TIA/TE]       Relevant Orders   EKG 12-Lead (Completed)   Ambulatory referral to [place]     This is a very complicated patient with long-standing cardiac disease who was just discharged from the hospital. Multiple outside clinic notes and reports were reviewed as noted below. I spent well over an hour simply on charting. I also spent additional 40 minutes with the patient and greater than infection of this time was in patient counseling understanding his history.  Current medicines are reviewed at length with the patient today. (+/- concerns) n/a The following changes have been made: n/a  Patient Instructions   Your physician recommends that monitor device clinic will contact you to assist  schedu- transferring care-  Home monitor  device is not working.---- [place] [address]    You have been referred to [place] CONTACTED [place] NEXT Wednesday- CONTACT OFFICE.   Sliding scale BUMEX: Weigh yourself when you get home, then Daily in the Morning. Your dry weight will be what your scale says on the day you return home.(here is 194-195lbs.).   If you gain more than 3 pounds from dry weight: Increase the bumex dosing to 4 mg in the morning until  weight returns to baseline dry weight.  If weight gain is greater than 5 pounds in 2 days: Increased to BUMEX 4 mg in the morning and 2 mg in the atfernoon  and contact the office for further assistance if weight does not go down the next day.  If the weight goes down more than 3 pounds from dry weight: Hold BUMEX until it returns to baseline dry weight    Your physician recommends that you schedule a follow-up appointment in 2 months with DR Yves Fodor.    Studies Ordered:   Orders Placed This Encounter  Procedures  . Ambulatory referral to Home Health  . EKG 12-Lead      Glenetta Hew, M.D., M.S. Interventional Cardiologist   Pager # [phone] Phone # [phone] [address] [place], [place] [address]   ADDENDUM:   Cardiac Cath September 2005: Severe proximal LAD. Significant OM disease with ISR, moderate RCA disease --> PCI LAD (Taxus DES 3.5 mm x 16 mm -- 3.8 mm) and PCI to OM (Taxus DES 3.5 mm X 16 mm -- 3.8 mm). PTCA of ISR and distal Cx OM .  January 27, 2014. Transthoracic echocardiogram. Ejection fraction 60%. Mild aortic stenosis.  December 20, 2014. Transthoracic echocardiogram.  1. The study quality is technically difficult.  2. The left ventricular chamber size is normal.  3. Mild left ventricular hypertrophy. 4. Global left ventricular wall motion and contractility are within normal limits. 5. The EF is estimated at 60-65%. 6. There is severe sclerosis of the aortic valve cusps. 7. Mild aortic regurgitation is present. 8. Moderate bilateral atrial enlargement is observed. 9. A pacemaker wire is visualized in the right ventricle.   Myoview 04/2015: 1. Abnormal Lexiscan cardiolite with evidence of prior myocardial infarction (most significant in lateral wall) without evidence of inducible myocardial ischemia. 2. Dilated left ventricle with moderately reduced systolic function with a calculated EF of 31% with moderate global hypokinesis.    Cath 06/2015:  PROCEDURE FINDINGS: (ANGIOGRAPHIC DATA) 1. Left main:Angiographically free of obstructive coronary artery disease. 2. Left anterior descending artery:Diffusely diseased vessel with competitive flow noted in the distal LAD. 3. Left circumflex:35% mid vessel disease.Following this, OM1 branch vessel is 100% occluded at previously placed tent.The distal circumflex is with 90% caliber vessel disease. OM 2 is also with 100% occluded - perfused by way of the saphenous vein graft (please see below). 4. Right coronary artery:70% long ostial disease segment with diffuse disease thereafter, 100% occlusion of distal segment.  GRAFT ANGIOGRAPHY: 1. LIMA to ZOX:WRUEAV patent graft with no stenosis noted at the anastomotic site.The native LAD is with minimal luminal  irregularities. 2. Saphenous vein graft to the second obtuse marginal branch vessel:  Widely patent graft with no stenosis noted at the anastomotic site. Luminal irregularities within the second obtuse marginal branch vessel,  which is a smaller caliber vessel.There is some retrograde filling to the OM1 branch vessel. 3. Saphenous vein graft to the right posterior descending artery:Long segment of 40% proximal vessel disease with similar long segment of 40% distal vessel disease.The right posterior descending artery itself was diffusely diseased and it is a small caliber vessel. 4. Saphenous vein graft to the ramus intermedius:100% occluded in its proximal segment.  PROCEDURE FINDINGS: 1. Severe native 3 vessel coronary artery disease as detailed. 2. Patent 3 of 4 grafts as detailed with unchanged coronary anatomy from  coronary angiogram in 2013 in [place].   Echo July 2017:   1. Technically difficult despite the use of definity contrast. Moderate Concentric left ventricular hypertrophy. Likely moderate to severely reduced left ventricular function. Global hypokinesis, possibly  worse in anterolateral, inferolateral, and apical segments. (EF in report estimates 40-45%) --> EF reported elsewhere as being 30%.  2. A pacemaker wire is visualized in the right ventricle. 3. The right ventricular global systolic function is mildly reduced. The right ventricular cavity size is mildly enlarged. 4. There is moderate sclerosis of the aortic valve cusps. Gradients likely underestimating the degree of aortic stenosis.  5. There is mild mitral regurgitation observed. 6. There is evidence of moderate pulmonary hypertension.  Discharge summary from July 2015: This document indicated that his EF improved from 30% to 65% by echo in 2016. On July 2 he was admitted for CHF he did undergo catheterization. Dr. be related to combined heart failure. (Echo actually that time was thought to be consistent with EF of 30%, but that was not reported result note) - Was seen in follow-up after that and was maintaining a weight of 195 pounds.

## 2016-09-13 NOTE — Patient Instructions (Addendum)
Your physician recommends that monitor device clinic will contact you to assist  schedu- transferring care-  Home monitor  device is not working.---- [place] [address]    You have been referred to [place] CONTACTED [place] NEXT Wednesday- CONTACT OFFICE.   Sliding scale BUMEX: Weigh yourself when you get home, then Daily in the Morning. Your dry weight will be what your scale says on the day you return home.(here is 194-195lbs.).   If you gain more than 3 pounds from dry weight: Increase the bumex dosing to 4 mg in the morning until weight returns to baseline dry weight.  If weight gain is greater than 5 pounds in 2 days: Increased to BUMEX 4 mg in the morning and 2 mg in the atfernoon  and contact the office for further assistance if weight does not go down the next day.  If the weight goes down more than 3 pounds from dry weight: Hold BUMEX until it returns to baseline dry weight    Your physician recommends that you schedule a follow-up appointment in 2 months with DR HARDING.

## 2016-09-17 ENCOUNTER — Telehealth: Payer: Self-pay | Admitting: *Deleted

## 2016-09-17 NOTE — Telephone Encounter (Signed)
CONTACTED [facility] FOR CHF AND EVALUATION AND TREATMENT FOR PHYSICAL THERAPY.  SPOKE TO TIFFANY. SHE STATES SHE WILL FOLLOW UP AND CONTACT PATIENT. SHE WILL CONTACT OFFICE IF FURTHER INFORMATION IS NEEDED.

## 2016-09-18 NOTE — Telephone Encounter (Signed)
Mora Bellman, RN        Ivin Booty, Thank you for the referral on Mr. Chlebowski. I was able to pull everything needed to get services started. I will be in touch with Mr. Holleman. Also, I anticipate one of our nurses starting his services tomorrow, Wednesday.

## 2016-09-20 ENCOUNTER — Encounter: Payer: Self-pay | Admitting: Cardiology

## 2016-09-20 ENCOUNTER — Telehealth: Payer: Self-pay | Admitting: Cardiology

## 2016-09-20 DIAGNOSIS — R2689 Other abnormalities of gait and mobility: Secondary | ICD-10-CM | POA: Diagnosis not present

## 2016-09-20 DIAGNOSIS — I481 Persistent atrial fibrillation: Secondary | ICD-10-CM | POA: Diagnosis not present

## 2016-09-20 DIAGNOSIS — N183 Chronic kidney disease, stage 3 (moderate): Secondary | ICD-10-CM | POA: Diagnosis not present

## 2016-09-20 DIAGNOSIS — I5042 Chronic combined systolic (congestive) and diastolic (congestive) heart failure: Secondary | ICD-10-CM | POA: Diagnosis not present

## 2016-09-20 DIAGNOSIS — I130 Hypertensive heart and chronic kidney disease with heart failure and stage 1 through stage 4 chronic kidney disease, or unspecified chronic kidney disease: Secondary | ICD-10-CM | POA: Diagnosis not present

## 2016-09-20 DIAGNOSIS — I2510 Atherosclerotic heart disease of native coronary artery without angina pectoris: Secondary | ICD-10-CM | POA: Diagnosis not present

## 2016-09-20 NOTE — Assessment & Plan Note (Signed)
He will need to be referred to our pacemaker management team. We will try to have that done here at the [facility] office.

## 2016-09-20 NOTE — Assessment & Plan Note (Addendum)
Recent exacerbation of chronic combined heart failure. He appears to be down his baseline weight. He just is somewhat weak from his recent hospitalization. He thinks he may have gotten a little bit out of sorts with his medications. I agree with higher dose of Toprol. Apparently he has had issues with hypotension and renal insufficiency on ACE inhibitor or ARB's, therefore we'll hold off for now. Would like to consider trying low-dose ARB in the future however we will hold off for now. Could also consider spironolactone. He is on standing dose of Bumex which is been doing well with. We discussed sliding scale Bumex. (See patient instructions)  We will arrange for home health assistance for evaluation to see if he would benefit from physical therapy. We will also try to consider cardiac rehabilitation for heart failure

## 2016-09-20 NOTE — Telephone Encounter (Signed)
Left message to call back  

## 2016-09-20 NOTE — Assessment & Plan Note (Signed)
Really only on Toprol as he has not been on ACE inhibitor or ARB as per renal insufficiency and hypotension. We could consider spironolactone which would complement Bumex. However hasn't ischemic no him about would like to make gradual adjustments.

## 2016-09-20 NOTE — Assessment & Plan Note (Signed)
No active anginal symptoms. It appears that his EF is stable. Last cath was just over a year ago. He is not on Plavix because of warfarin. He is on aspirin and statin as well as beta blocker. HisPCP is managing his lipids.

## 2016-09-20 NOTE — Telephone Encounter (Signed)
New message       Calling to get a verbal to move physical therapy evaluation to wed, aug 15th.  Is this ok?

## 2016-09-20 NOTE — Assessment & Plan Note (Signed)
Relatively a symptomatically for A. fib as he is mostly paced. In the past they've opted for rate control as well as rhythm control. On warfarin for anticoagulation.  This patients CHA2DS2-VASc Score and unadjusted Ischemic Stroke Rate (% per year) is equal to 4.8 % stroke rate/year from a score of 4  Above score calculated as 1 point each if present [CHF, HTN, DM, Vascular=MI/PAD/Aortic Plaque, Age if 65-74, or Male] Above score calculated as 2 points each if present [Age > 75, or Stroke/TIA/TE]

## 2016-09-23 NOTE — Telephone Encounter (Signed)
Left message that 09-25-16 is okay for PT evaluation.

## 2016-09-25 ENCOUNTER — Telehealth: Payer: Self-pay | Admitting: Cardiology

## 2016-09-25 DIAGNOSIS — I5042 Chronic combined systolic (congestive) and diastolic (congestive) heart failure: Secondary | ICD-10-CM | POA: Diagnosis not present

## 2016-09-25 DIAGNOSIS — R2689 Other abnormalities of gait and mobility: Secondary | ICD-10-CM | POA: Diagnosis not present

## 2016-09-25 DIAGNOSIS — I2510 Atherosclerotic heart disease of native coronary artery without angina pectoris: Secondary | ICD-10-CM | POA: Diagnosis not present

## 2016-09-25 DIAGNOSIS — I130 Hypertensive heart and chronic kidney disease with heart failure and stage 1 through stage 4 chronic kidney disease, or unspecified chronic kidney disease: Secondary | ICD-10-CM | POA: Diagnosis not present

## 2016-09-25 DIAGNOSIS — I481 Persistent atrial fibrillation: Secondary | ICD-10-CM | POA: Diagnosis not present

## 2016-09-25 DIAGNOSIS — N183 Chronic kidney disease, stage 3 (moderate): Secondary | ICD-10-CM | POA: Diagnosis not present

## 2016-09-25 NOTE — Telephone Encounter (Signed)
She needs a verbal order for Physical Therapy for 2 times a weeks for 4 weeks please.

## 2016-09-25 NOTE — Telephone Encounter (Signed)
Per epic, MD ordered HH/PT on 8/7 Called [facility] and OK'ed PT frequency Routed to MD/RN

## 2016-09-26 DIAGNOSIS — R2689 Other abnormalities of gait and mobility: Secondary | ICD-10-CM | POA: Diagnosis not present

## 2016-09-26 DIAGNOSIS — I481 Persistent atrial fibrillation: Secondary | ICD-10-CM | POA: Diagnosis not present

## 2016-09-26 DIAGNOSIS — I130 Hypertensive heart and chronic kidney disease with heart failure and stage 1 through stage 4 chronic kidney disease, or unspecified chronic kidney disease: Secondary | ICD-10-CM | POA: Diagnosis not present

## 2016-09-26 DIAGNOSIS — I5042 Chronic combined systolic (congestive) and diastolic (congestive) heart failure: Secondary | ICD-10-CM | POA: Diagnosis not present

## 2016-09-26 DIAGNOSIS — I2510 Atherosclerotic heart disease of native coronary artery without angina pectoris: Secondary | ICD-10-CM | POA: Diagnosis not present

## 2016-09-26 DIAGNOSIS — N183 Chronic kidney disease, stage 3 (moderate): Secondary | ICD-10-CM | POA: Diagnosis not present

## 2016-09-30 ENCOUNTER — Other Ambulatory Visit: Payer: Self-pay | Admitting: Family Medicine

## 2016-09-30 MED ORDER — OMEPRAZOLE 20 MG PO CPDR: 20.0000 mg | DELAYED_RELEASE_CAPSULE | Freq: Every day | ORAL | 2 refills | Status: DC

## 2016-09-30 NOTE — Telephone Encounter (Signed)
Sent in omeprazole/to local pharmacy/patient has been notified.

## 2016-09-30 NOTE — Telephone Encounter (Signed)
Relation to BB:UYZJ Call back number:[phone] Pharmacy: [facility], [place] [phone] (Fax)     Reason for call:  Patient requesting 90 day supply omeprazole (PRILOSEC) 20 MG capsule

## 2016-10-03 ENCOUNTER — Telehealth: Payer: Self-pay | Admitting: *Deleted

## 2016-10-03 NOTE — Telephone Encounter (Signed)
LMTCB/sss  Patient is now enrolled in [employer].

## 2016-10-04 ENCOUNTER — Encounter: Payer: Self-pay | Admitting: Cardiovascular Disease

## 2016-10-04 ENCOUNTER — Encounter: Payer: Self-pay | Admitting: Cardiology

## 2016-10-04 DIAGNOSIS — I130 Hypertensive heart and chronic kidney disease with heart failure and stage 1 through stage 4 chronic kidney disease, or unspecified chronic kidney disease: Secondary | ICD-10-CM | POA: Diagnosis not present

## 2016-10-04 DIAGNOSIS — I481 Persistent atrial fibrillation: Secondary | ICD-10-CM | POA: Diagnosis not present

## 2016-10-04 DIAGNOSIS — I5042 Chronic combined systolic (congestive) and diastolic (congestive) heart failure: Secondary | ICD-10-CM | POA: Diagnosis not present

## 2016-10-04 DIAGNOSIS — R2689 Other abnormalities of gait and mobility: Secondary | ICD-10-CM | POA: Diagnosis not present

## 2016-10-04 DIAGNOSIS — I2510 Atherosclerotic heart disease of native coronary artery without angina pectoris: Secondary | ICD-10-CM | POA: Diagnosis not present

## 2016-10-04 DIAGNOSIS — N183 Chronic kidney disease, stage 3 (moderate): Secondary | ICD-10-CM | POA: Diagnosis not present

## 2016-10-08 DIAGNOSIS — I2510 Atherosclerotic heart disease of native coronary artery without angina pectoris: Secondary | ICD-10-CM | POA: Diagnosis not present

## 2016-10-08 DIAGNOSIS — I5042 Chronic combined systolic (congestive) and diastolic (congestive) heart failure: Secondary | ICD-10-CM | POA: Diagnosis not present

## 2016-10-08 DIAGNOSIS — I130 Hypertensive heart and chronic kidney disease with heart failure and stage 1 through stage 4 chronic kidney disease, or unspecified chronic kidney disease: Secondary | ICD-10-CM | POA: Diagnosis not present

## 2016-10-08 DIAGNOSIS — N183 Chronic kidney disease, stage 3 (moderate): Secondary | ICD-10-CM | POA: Diagnosis not present

## 2016-10-08 DIAGNOSIS — R2689 Other abnormalities of gait and mobility: Secondary | ICD-10-CM | POA: Diagnosis not present

## 2016-10-08 DIAGNOSIS — I481 Persistent atrial fibrillation: Secondary | ICD-10-CM | POA: Diagnosis not present

## 2016-10-10 DIAGNOSIS — I481 Persistent atrial fibrillation: Secondary | ICD-10-CM | POA: Diagnosis not present

## 2016-10-10 DIAGNOSIS — I5042 Chronic combined systolic (congestive) and diastolic (congestive) heart failure: Secondary | ICD-10-CM | POA: Diagnosis not present

## 2016-10-10 DIAGNOSIS — I130 Hypertensive heart and chronic kidney disease with heart failure and stage 1 through stage 4 chronic kidney disease, or unspecified chronic kidney disease: Secondary | ICD-10-CM | POA: Diagnosis not present

## 2016-10-10 DIAGNOSIS — R2689 Other abnormalities of gait and mobility: Secondary | ICD-10-CM | POA: Diagnosis not present

## 2016-10-10 DIAGNOSIS — I2510 Atherosclerotic heart disease of native coronary artery without angina pectoris: Secondary | ICD-10-CM | POA: Diagnosis not present

## 2016-10-10 DIAGNOSIS — N183 Chronic kidney disease, stage 3 (moderate): Secondary | ICD-10-CM | POA: Diagnosis not present

## 2016-10-17 ENCOUNTER — Telehealth: Payer: Self-pay | Admitting: Family Medicine

## 2016-10-17 ENCOUNTER — Other Ambulatory Visit: Payer: Self-pay | Admitting: Family Medicine

## 2016-10-17 DIAGNOSIS — I5042 Chronic combined systolic (congestive) and diastolic (congestive) heart failure: Secondary | ICD-10-CM | POA: Diagnosis not present

## 2016-10-17 DIAGNOSIS — I130 Hypertensive heart and chronic kidney disease with heart failure and stage 1 through stage 4 chronic kidney disease, or unspecified chronic kidney disease: Secondary | ICD-10-CM | POA: Diagnosis not present

## 2016-10-17 DIAGNOSIS — R2689 Other abnormalities of gait and mobility: Secondary | ICD-10-CM | POA: Diagnosis not present

## 2016-10-17 DIAGNOSIS — I481 Persistent atrial fibrillation: Secondary | ICD-10-CM | POA: Diagnosis not present

## 2016-10-17 DIAGNOSIS — N183 Chronic kidney disease, stage 3 (moderate): Secondary | ICD-10-CM | POA: Diagnosis not present

## 2016-10-17 DIAGNOSIS — I2510 Atherosclerotic heart disease of native coronary artery without angina pectoris: Secondary | ICD-10-CM | POA: Diagnosis not present

## 2016-10-17 MED ORDER — BUMETANIDE 1 MG PO TABS: 2.0000 mg | ORAL_TABLET | Freq: Every day | ORAL | 1 refills | Status: DC

## 2016-10-17 MED ORDER — METOPROLOL SUCCINATE ER 25 MG PO TB24: 25.0000 mg | ORAL_TABLET | Freq: Every day | ORAL | 5 refills | Status: DC

## 2016-10-17 MED ORDER — METOPROLOL SUCCINATE ER 25 MG PO TB24: 12.5000 mg | ORAL_TABLET | Freq: Every day | ORAL | 1 refills | Status: DC

## 2016-10-17 NOTE — Telephone Encounter (Signed)
Pt states [facility] changed script from 1/2 tablet per day to one full tablet per day. The instructions were not changed on the new script Metoprolol 25mg  tabs. Pt will pick up tomorrow. He only has one pill for today. Please contact [facility] on [address] to inform them of the new instructions of one tablet per day.

## 2016-10-17 NOTE — Telephone Encounter (Signed)
Advise if this is correct?  Then can change if so.

## 2016-10-17 NOTE — Telephone Encounter (Signed)
Take 1 tab daily. Splitting apart an extended release tab defeats the purpose. I did reorder the correct sig. TY.

## 2016-10-18 DIAGNOSIS — I2510 Atherosclerotic heart disease of native coronary artery without angina pectoris: Secondary | ICD-10-CM | POA: Diagnosis not present

## 2016-10-18 DIAGNOSIS — I130 Hypertensive heart and chronic kidney disease with heart failure and stage 1 through stage 4 chronic kidney disease, or unspecified chronic kidney disease: Secondary | ICD-10-CM | POA: Diagnosis not present

## 2016-10-18 DIAGNOSIS — R2689 Other abnormalities of gait and mobility: Secondary | ICD-10-CM | POA: Diagnosis not present

## 2016-10-18 DIAGNOSIS — I481 Persistent atrial fibrillation: Secondary | ICD-10-CM | POA: Diagnosis not present

## 2016-10-18 DIAGNOSIS — N183 Chronic kidney disease, stage 3 (moderate): Secondary | ICD-10-CM | POA: Diagnosis not present

## 2016-10-18 DIAGNOSIS — I5042 Chronic combined systolic (congestive) and diastolic (congestive) heart failure: Secondary | ICD-10-CM | POA: Diagnosis not present

## 2016-10-23 DIAGNOSIS — Z85828 Personal history of other malignant neoplasm of skin: Secondary | ICD-10-CM | POA: Diagnosis not present

## 2016-10-23 DIAGNOSIS — L570 Actinic keratosis: Secondary | ICD-10-CM | POA: Diagnosis not present

## 2016-10-23 DIAGNOSIS — X32XXXD Exposure to sunlight, subsequent encounter: Secondary | ICD-10-CM | POA: Diagnosis not present

## 2016-10-23 DIAGNOSIS — D692 Other nonthrombocytopenic purpura: Secondary | ICD-10-CM | POA: Diagnosis not present

## 2016-10-23 DIAGNOSIS — Z08 Encounter for follow-up examination after completed treatment for malignant neoplasm: Secondary | ICD-10-CM | POA: Diagnosis not present

## 2016-10-28 ENCOUNTER — Ambulatory Visit: Payer: Medicare Other | Admitting: Cardiovascular Disease

## 2016-10-30 ENCOUNTER — Telehealth: Payer: Self-pay | Admitting: Family Medicine

## 2016-10-30 MED ORDER — SIMVASTATIN 20 MG PO TABS: 20.0000 mg | ORAL_TABLET | Freq: Every evening | ORAL | 0 refills | Status: DC

## 2016-10-30 NOTE — Telephone Encounter (Signed)
Relation to LD:JTTS Call back number:[phone] Pharmacy: [facility], [place] [phone] (Phone) [phone] (Fax)     Reason for call:  Patient requesting 90 day supply simvastatin (ZOCOR) 20 MG tablet, please advise

## 2016-10-30 NOTE — Telephone Encounter (Signed)
Sent in cholesterol medication.

## 2016-11-13 ENCOUNTER — Telehealth: Payer: Self-pay | Admitting: Family Medicine

## 2016-11-13 NOTE — Telephone Encounter (Signed)
I am happy to see her and address any potential concerns or clear up any medication issues. Does the physician at the memory center manager her medications and general health? TY.

## 2016-11-13 NOTE — Telephone Encounter (Signed)
Pt states wife Sherre Lain 01/05/30 is at [facility] and has been since May 2018. He states Dr Nani Ravens will be a good doctor to take over her care so that the facility will take care of her correctly. Pt states he will bring records for [facility] to review if needed. Pt states he can bring wife to appt here to establish for PCP. Please advise pt [phone].

## 2016-11-14 ENCOUNTER — Other Ambulatory Visit (HOSPITAL_COMMUNITY): Payer: Self-pay | Admitting: Physician Assistant

## 2016-11-14 DIAGNOSIS — I480 Paroxysmal atrial fibrillation: Secondary | ICD-10-CM

## 2016-11-14 NOTE — Telephone Encounter (Signed)
Please read Dr. Serita Sheller response and schedule his wife with him

## 2016-11-20 ENCOUNTER — Telehealth: Payer: Self-pay | Admitting: Pharmacist

## 2016-11-20 NOTE — Telephone Encounter (Signed)
Refill request for warfarin 

## 2016-11-21 NOTE — Telephone Encounter (Signed)
OK. Ty.

## 2016-12-11 ENCOUNTER — Ambulatory Visit (INDEPENDENT_AMBULATORY_CARE_PROVIDER_SITE_OTHER): Payer: Medicare Other | Admitting: Cardiology

## 2016-12-11 ENCOUNTER — Ambulatory Visit (INDEPENDENT_AMBULATORY_CARE_PROVIDER_SITE_OTHER): Payer: Medicare Other | Admitting: Pharmacist

## 2016-12-11 ENCOUNTER — Encounter: Payer: Medicare Other | Admitting: Cardiovascular Disease

## 2016-12-11 ENCOUNTER — Encounter: Payer: Self-pay | Admitting: Cardiology

## 2016-12-11 DIAGNOSIS — N183 Chronic kidney disease, stage 3 unspecified: Secondary | ICD-10-CM

## 2016-12-11 DIAGNOSIS — Z95810 Presence of automatic (implantable) cardiac defibrillator: Secondary | ICD-10-CM | POA: Diagnosis not present

## 2016-12-11 DIAGNOSIS — I255 Ischemic cardiomyopathy: Secondary | ICD-10-CM

## 2016-12-11 DIAGNOSIS — I2510 Atherosclerotic heart disease of native coronary artery without angina pectoris: Secondary | ICD-10-CM | POA: Diagnosis not present

## 2016-12-11 DIAGNOSIS — I5042 Chronic combined systolic (congestive) and diastolic (congestive) heart failure: Secondary | ICD-10-CM | POA: Diagnosis not present

## 2016-12-11 DIAGNOSIS — I481 Persistent atrial fibrillation: Secondary | ICD-10-CM

## 2016-12-11 DIAGNOSIS — I4819 Other persistent atrial fibrillation: Secondary | ICD-10-CM

## 2016-12-11 DIAGNOSIS — I4891 Unspecified atrial fibrillation: Secondary | ICD-10-CM

## 2016-12-11 LAB — POCT INR
INR: 3
INR: 3.1

## 2016-12-11 MED ORDER — BUMETANIDE 1 MG PO TABS: 2.0000 mg | ORAL_TABLET | Freq: Every day | ORAL | 3 refills | Status: DC

## 2016-12-11 NOTE — Progress Notes (Signed)
PCP: Kenneth Pal, DO  Clinic Note: Chief Complaint  Patient presents with  . Follow-up    weight is up today ~5-6 lb from baseline (home wgt 191 lb)  . Coronary Artery Disease  . Cardiomyopathy    ischemic  . Atrial Fibrillation    on BB & Warfarin    HPI: Kenneth Bell is a 83 y.o. male with a PMH below who presents today for 47-month follow-up for CAD, ischemic cardiomyopathy with chronic combined heart failure and recent diagnosis of atrial fibrillation. He is a former patient of Kenneth Brooms, MD from [place]. He is a long history of coronary disease   MI 2002  -while living in [place]  PCI to LAD, OM1 and OM 2 in 2005; had catheterization in 2008. PCI in 2011.  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  Most recent Myoview in March 2017: Demonstrated prior infarct and lateral wall without inducible ischemia. EF 31% with moderate global HK.  Last cardiac cath was in May 2017:  ? Left main patent. Diffuse LAD disease with competitive flow and distal LAD from LIMA. Mid Cx 35% with occlusion of OM 1 and 90% dCx. OM 2 occluded (perfuse via SVG). Ostial RCA 70%, dRCA 100%.  ? Patent LIMA-LAD. Widely patent SVG-OM, SVG-PDA: 40% proximal. 100% proximal SVG-RI  Echo July 2017: Moderate LV dysfunction with global HK. Possibly worse in the anterolateral and inferolateral/apical segments. EF 40-45%.  Chronic Combined Systolic and Diastolic CHF  --> baseline weight is usually 191 lb (on home scale)  has not been on ACE inhibitor or ARB secondary to hypotension and chronic kidney disease.  On standing dose Bumex with Sliding Scale  PAF  (appears to be maybe more persistent)-> on warfarin and Toprol; clearly this exacerbates his heart failure when he goes in RVR.  Has history of apparently pleural effusion status post thoracentesis.  Kenneth Bell was last seen  on September 13, 2016 for hospital follow-up when he was admitted for heart failure exacerbation, gated by A. fib in July 2018 (actually seen by Dr. Debara Pickett and Dr. Sallyanne Kuster in the hospital) he had been diuresed -with IV Lasix. --At that time, he noted that he was feeling better since his hospital stay.  Was still noticing exertional dyspnea and weakness.  No sensation of A. fib.  TPA just increased his beta-blocker dose for blood pressure control. Visit: Home health set up, Set up ICD check, reviewed Sliding scale Bumex  Recent Hospitalizations: None  Studies Personally Reviewed - (if available, images/films reviewed: From Epic Chart or Care Everywhere)  None  Interval History: Kenneth Bell presents today actually looking and feeling relatively well.  He is pretty much been doing well since his last visit.  He brings with me his weight log that shows that he has been pretty much near his goal weight of 191 pounds at home.  Unfortunately he said today his weight is up according to his home scale 5-6pounds and he knows that he needs to increase his Bumex dose.  He just wanted to review the correct way to do it when it was this high. He does note that when his weights are down at the baseline 198 and 191 pounds, he feels much better.  He would deny any significant PND orthopnea, but does does have some end of day edema.  Today he feels a  little more short of breath than usual since his weight is up.  This is the first time since my last visit that his weight has been more than 3 pounds above his dry weight.  He still denies any sensation of being back in A. fib since his last visit.  No rapid irregular heartbeats or palpitations.  No lightheadedness, dizziness or syncope/near syncope with the exception of some orthostatic dizziness. He has not had any chest tightness or pressure with rest or exertion, but does note exertional dyspnea that limits his activity. He has had some insomnia issues and has been taking  Tylenol PM, but this seems to make him feel hung over the next day.  He intends to switch over to melatonin.  No syncope/near syncope, orTIA/amaurosis fugax symptoms. No melena, hematochezia, hematuria, or epstaxis.  He does note some mild easy bruising No claudication.  ROS: A comprehensive was performed.  Pertinent symptoms noted above Review of Systems  Constitutional: Positive for malaise/fatigue (Tires easily). Negative for weight loss.  HENT: Positive for hearing loss. Negative for congestion and sore throat.   Respiratory: Positive for shortness of breath. Negative for cough and wheezing.   Gastrointestinal: Negative for abdominal pain, constipation and heartburn.  Genitourinary: Negative for dysuria and hematuria.  Musculoskeletal: Positive for joint pain (Bilateral knee pain seems to limit his walking).  Neurological: Positive for dizziness (If he stands up too quickly).  Endo/Heme/Allergies: Bruises/bleeds easily.  Psychiatric/Behavioral: Negative for depression and memory loss (Not obviously). The patient is not nervous/anxious.   All other systems reviewed and are negative.  I have reviewed and (if needed) personally updated the patient's problem list, medications, allergies, past medical and surgical history, social and family history.   Past Medical History:  Diagnosis Date  . Chronic combined systolic and diastolic heart failure, NYHA class 3 (HCC)    EF has seemed to vary report-to-report. Myoview 2007 showed EF 30%, echo in 2016 7 EF 60-65% -> Echo in 08/2015 & 08/2016  40-45%  (with basal-mid inferoseptal & apical inferolateral wall), Mod P HTN (PAP ~53 mHg)  . CKD (chronic kidney disease), stage III ([place]) 08/26/2016  . COPD (chronic obstructive pulmonary disease) ([place])   . Coronary artery disease involving native heart with angina pectoris ([place])    a. MI 2002 - PCI to [place]. b. 2005: PCI to LAD, OM1 & OM2.  c. s/p CABG in 2013: LIMA to LAD, SVG to OM2, SVG to PDA and SVG to  RI. ; d. Cath 2017: occluded LAD, OM1 & OM2, SVG-RI with patent LIMA-LAD, SVG-rPDA & SVG-OM2.   . Essential hypertension   . Hyperlipidemia with target LDL less than 70   . Mild aortic insufficiency 08/28/2016   noted on Echo  . 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 Persistent atrial fibrillation ([place])   . Presence of biventricular implantable cardioverter-defibrillator (ICD) 2008  . Thyroid disease     Past Surgical History:  Procedure Laterality Date  . APPENDECTOMY    . CARDIAC CATHETERIZATION  06/2015   Left main patent. Diffuse LAD disease with competitive flow and distal LAD from LIMA. Mid Cx 35% with occlusion of OM 1 and 90% dCx. OM 2 occluded (perfuse via SVG). Ostial RCA 70%, dRCA 100%.  Patent LIMA-LAD. Widely patent SVG-OM, SVG-PDA: 40% proximal. 100% proximal SVG-RI  . CARDIAC SURGERY    . CARPAL TUNNEL RELEASE Right 2017  . CORONARY  ANGIOPLASTY WITH STENT PLACEMENT     Prior PCI to OM 2; 2005 PCI to LAD and OM1 with a Taxus DES 2.5 x 16 mm (postdilated to 3.8 mm) and PTCA of OM 2 stent  . CORONARY ARTERY BYPASS GRAFT  2013   LIMA-LAD, SVG-L1-2, SVG-PDA, SVG-RI (known occlusion of SVG RI)  . KNEE SURGERY Bilateral 1999  . NM MYOVIEW LTD  04/2015   Prior lateral infarct with no scanning. EF 31% with moderate global HK  . SHOULDER SURGERY Bilateral    Twice  . TRANSTHORACIC ECHOCARDIOGRAM  08/2015   Moderate LV dysfunction with global HK (estimated at 40 and 45%). Worse anterolateral and inferolateral wall.  . TRANSTHORACIC ECHOCARDIOGRAM  08/2016   EF 40-45%. Moderate concentric LVH. Akinesis of basal-mid inferoseptal and apical inferolateral wall.  Aortic sclerosis, no stenosis. Moderate mitral thickening with mild MR. Moderate RV dilation. Moderate TR and PR. PA pressures 53 mmHg  . VEIN BYPASS SURGERY  2013    Current Meds  Medication Sig  . aspirin EC 81 MG EC tablet Take  1 tablet (81 mg total) by mouth daily.  . bumetanide (BUMEX) 1 MG tablet Take 2 tablets (2 mg total) by mouth daily. MAY TAKE AN EXTRA TABLET IF NEEDED DEPENDING ON DAILY WEIGHT  . diphenhydramine-acetaminophen (TYLENOL PM) 25-500 MG TABS tablet Take 1 tablet by mouth at bedtime as needed (for sleep).  Marland Kitchen levothyroxine (SYNTHROID, LEVOTHROID) 50 MCG tablet Take 1 tablet (50 mcg total) by mouth daily.  . metoprolol succinate (TOPROL-XL) 25 MG 24 hr tablet Take 1 tablet (25 mg total) by mouth daily.  Marland Kitchen omeprazole (PRILOSEC) 20 MG capsule Take 1 capsule (20 mg total) by mouth daily.  . potassium chloride (MICRO-K) 10 MEQ CR capsule Take 10 mEq by mouth daily.  . simvastatin (ZOCOR) 20 MG tablet Take 1 tablet (20 mg total) by mouth every evening.  . warfarin (COUMADIN) 6 MG tablet CONTINUE SAME HOME DOSE: Take 1/2 tablet by mouth on Mondays and Fridays, and 1 whole tablet per day on all other days.  . [DISCONTINUED] bumetanide (BUMEX) 1 MG tablet Take 2 tablets (2 mg total) by mouth daily.    Allergies  Allergen Reactions  . Nitroglycerin Other (See Comments)    DROPS BLOOD PRESSURE DRASTICALLY    Social History   Social History  . Marital status: Married    Spouse name: N/A  . Number of children: N/A  . Years of education: N/A   Social History Main Topics  . Smoking status: Former Smoker    Types: Cigarettes    Quit date: 05/30/1967  . Smokeless tobacco: Never Used  . Alcohol use 3.6 oz/week    6 Glasses of wine per week  . Drug use: No  . Sexual activity: Not Asked   Other Topics Concern  . None   Social History Narrative   He has been married for 66 years. They have 5 children and 10 grandchildren. 12 great-grandchildren.   He recently moved to [place] [facility] [place] [place], [place] and oriented close to his daughter here. His wife has been moved into an Alzheimer's/memory unit nearby.   He currently lives with his daughter.   He has a college degree and previously worked as a self-employed  Materials engineer.   He himself quit driving couple years ago because he was concerned about his reaction time. He is therefore depend upon his family or public transportation.   He quit smoking in 1969 having smoked for 20 years.   He  drinks maybe 4-7 drinks alcohol/wine a week.   He does not exercise any longer because of shortness of breath and lack of energy.    family history includes Arthritis in his mother; Heart disease in his father.  Wt Readings from Last 3 Encounters:  12/11/16 198 lb 12.8 oz (90.2 kg)  09/13/16 195 lb 9.6 oz (88.7 kg)  09/09/16 196 lb 6.4 oz (89.1 kg)    PHYSICAL EXAM BP 120/70   Pulse 60   Ht 5\' 10"  (1.778 m)   Wt 198 lb 12.8 oz (90.2 kg)   SpO2 93%   BMI 28.52 kg/m  Physical Exam  Constitutional: He is oriented to person, place, and time. He appears well-developed and well-nourished. No distress.  Well-groomed.  Healthy-appearing.  HENT:  Head: Normocephalic and atraumatic.  Eyes: Pupils are equal, round, and reactive to light. EOM are normal. No scleral icterus.  Neck: Hepatojugular reflux and JVD (10-12 cm of water.) present. Carotid bruit is not present (Radiated murmur).  Cardiovascular: Normal rate, regular rhythm and normal pulses.   No extrasystoles are present. PMI is not displaced.  Exam reveals no gallop.   Murmur heard.  Low-pitched harsh crescendo-decrescendo early systolic murmur is present with a grade of 2/6  at the upper right sternal border radiating to the neck Pulmonary/Chest: Effort normal and breath sounds normal. No respiratory distress. He has no wheezes (Mild late expiratory). He has no rales (Mild interstitial sounds/crackles but no rhonchi or rales.).  Abdominal: Soft. Bowel sounds are normal. He exhibits no distension. There is no tenderness. There is no rebound.  Musculoskeletal: Normal range of motion. He exhibits edema (1+ bilateral lower extremity).  Neurological: He is alert and oriented to person, place, and time.    Skin: Skin is warm and dry. No rash noted. No erythema.  Mild venous stasis changes  Psychiatric: He has a normal mood and affect. His behavior is normal. Judgment and thought content normal.  Nursing note and vitals reviewed.   Adult ECG Report Not checked  Other studies Reviewed: Additional studies/ records that were reviewed today include:  Recent Labs:   Lab Results  Component Value Date   CREATININE 1.81 (H) 09/09/2016   BUN 41 (H) 09/09/2016   NA 138 09/09/2016   K 4.0 09/09/2016   CL 102 09/09/2016   CO2 28 09/09/2016   No results found for: CHOL, HDL, LDLCALC, LDLDIRECT, TRIG, CHOLHDL   ASSESSMENT / PLAN: Problem List Items Addressed This Visit    Biventricular ICD (implantable cardioverter-defibrillator) in place (Chronic)    We will get him linked up with our [facility] management team.      CAD s/p CABG (Chronic)    Stable EF over the last 2 years, but it is reduced. At present, I do not have plans to check a follow-up Myoview stress test unless he has anginal symptoms.  The time to check the Myoview would have been when his EF dropped last year.  At this point I think it may be too late. On aspirin, beta-blocker and statin.      Relevant Medications   bumetanide (BUMEX) 1 MG tablet   Chronic combined systolic and diastolic heart failure, NYHA class 3 (HCC) (Chronic)    Most recent exacerbation was back in July accompanied by A. fib RVR. Hopefully if he can maintain sinus rhythm, he will get into too much trouble.  We reiterated the concept of sliding scale Bumex noted below.  We are going to go back up  to full 2 mg dose of Bumex daily as a standing dose as opposed to intermittently using 1 mg. He is on Toprol but no afterload reducing agent at this point because of renal insufficiency and borderline pressures.  Would probably consider adding spironolactone before adding ACE inhibitor or ARB in the future.  He still has home health assistance for  physical therapy.  Based on those recognitions, we can consider referral to cardiac rehabilitation for his chronic heart failure and CAD.      Relevant Medications   bumetanide (BUMEX) 1 MG tablet   CKD (chronic kidney disease), stage III (HCC) (Chronic)    With his renal function as it is, we have not added ACE inhibitor or ARB and are reluctant to add spironolactone.      Ischemic cardiomyopathy (Chronic)    Moderately reduced EF on stable dose of Toprol.  He has not been on ACE inhibitor or ARB because of relatively low blood pressures and some renal insufficiency. He is on stable dose of Bumex and we are going to have him actually take a full 2 mg dose daily as opposed to 1 mg which she has been doing recently.  He was a 2 mg daily and follow the sliding scale noted and instructions below. If his blood pressure were to creep up, I would consider adding spironolactone to his baseline diuretic regimen.       Relevant Medications   bumetanide (BUMEX) 1 MG tablet   Persistent atrial fibrillation (HCC); CHA2DS2Vasc = ~4 (CHF, MI/aortic plaque, Age 57) (Chronic)    Currently remains asymptomatic and apparently in sinus rhythm on exam. The plan has been rate control over rhythm control. Remains happily stable on warfarin for anticoagulation.  At this point, I think if he were to go into A. fib RVR, the best option would be to cardiovert as soon as possible to avoid exacerbation of his heart failure.      Relevant Medications   bumetanide (BUMEX) 1 MG tablet      Current medicines are reviewed at length with the patient today. (+/- concerns) is it okay to take melatonin for sleep as opposed to Tylenol PM.  Can you explain sliding scale Bumex The following changes have been made:See below  Patient Instructions  MEDICATION CHANGES  TAKE BUMEX 2 MG ( 2 TABLETS OF 1 MG ) AT THE SAME TIME DAILY  Sliding scale BUMEX: Weigh yourself when you get home, then Daily in the Morning. Your dry  weight will be what your scale says on the day you return home.(here is 187-190 lbs.).   If you gain more than 3 pounds from dry weight: Increase the BUMEX dosing to 2 MG (2 TABLET OF 1 MG )in the morning and 1 mg in the afternoon until weight returns to baseline dry weight.  If the weight goes down more than 3 pounds (BELOW 187 LBS) from dry weight: Hold BUMEX until it returns to baseline dry weight   CHANGE AN TAKE METOPROLOL IN THE EVENING TIME   Your physician wants you to follow-up in 4-5 MONTHS WITH DR  Kaya Klausing. You will receive a reminder letter in the mail two months in advance. If you don't receive a letter, please call our office to schedule the follow-up appointment.    Studies Ordered:   No orders of the defined types were placed in this encounter.     Glenetta Hew, M.D., M.S. Interventional Cardiologist   Pager # [phone] Phone # [phone] [address]  [place] [place], [place] [address]

## 2016-12-11 NOTE — Patient Instructions (Signed)
MEDICATION CHANGES  TAKE BUMEX 2 MG ( 2 TABLETS OF 1 MG ) AT THE SAME TIME DAILY  Sliding scale BUMEX: Weigh yourself when you get home, then Daily in the Morning. Your dry weight will be what your scale says on the day you return home.(here is 187-190 lbs.).   If you gain more than 3 pounds from dry weight: Increase the BUMEX dosing to 2 MG (2 TABLET OF 1 MG )in the morning and 1 mg in the afternoon until weight returns to baseline dry weight.  If the weight goes down more than 3 pounds (BELOW 187 LBS) from dry weight: Hold BUMEX until it returns to baseline dry weight   CHANGE AN TAKE METOPROLOL IN THE EVENING TIME   Your physician wants you to follow-up in 4-5 MONTHS WITH DR  HARDING. You will receive a reminder letter in the mail two months in advance. If you don't receive a letter, please call our office to schedule the follow-up appointment.

## 2016-12-13 ENCOUNTER — Encounter: Payer: Self-pay | Admitting: Cardiology

## 2016-12-13 NOTE — Assessment & Plan Note (Addendum)
Currently remains asymptomatic and apparently in sinus rhythm on exam. The plan has been rate control over rhythm control. Remains happily stable on warfarin for anticoagulation.  At this point, I think if he were to go into A. fib RVR, the best option would be to cardiovert as soon as possible to avoid exacerbation of his heart failure.

## 2016-12-13 NOTE — Assessment & Plan Note (Signed)
We will get him linked up with our [facility] management team.

## 2016-12-13 NOTE — Assessment & Plan Note (Signed)
Stable EF over the last 2 years, but it is reduced. At present, I do not have plans to check a follow-up Myoview stress test unless he has anginal symptoms.  The time to check the Myoview would have been when his EF dropped last year.  At this point I think it may be too late. On aspirin, beta-blocker and statin.

## 2016-12-13 NOTE — Assessment & Plan Note (Addendum)
With his renal function as it is, we have not added ACE inhibitor or ARB and are reluctant to add spironolactone.

## 2016-12-13 NOTE — Assessment & Plan Note (Signed)
Moderately reduced EF on stable dose of Toprol.  He has not been on ACE inhibitor or ARB because of relatively low blood pressures and some renal insufficiency. He is on stable dose of Bumex and we are going to have him actually take a full 2 mg dose daily as opposed to 1 mg which she has been doing recently.  He was a 2 mg daily and follow the sliding scale noted and instructions below. If his blood pressure were to creep up, I would consider adding spironolactone to his baseline diuretic regimen.

## 2016-12-13 NOTE — Assessment & Plan Note (Signed)
Most recent exacerbation was back in July accompanied by A. fib RVR. Hopefully if he can maintain sinus rhythm, he will get into too much trouble.  We reiterated the concept of sliding scale Bumex noted below.  We are going to go back up to full 2 mg dose of Bumex daily as a standing dose as opposed to intermittently using 1 mg. He is on Toprol but no afterload reducing agent at this point because of renal insufficiency and borderline pressures.  Would probably consider adding spironolactone before adding ACE inhibitor or ARB in the future.  He still has home health assistance for physical therapy.  Based on those recognitions, we can consider referral to cardiac rehabilitation for his chronic heart failure and CAD.

## 2016-12-30 ENCOUNTER — Encounter: Payer: Medicare Other | Admitting: Cardiovascular Disease

## 2017-01-01 DIAGNOSIS — C61 Malignant neoplasm of prostate: Secondary | ICD-10-CM | POA: Diagnosis not present

## 2017-01-15 ENCOUNTER — Ambulatory Visit (INDEPENDENT_AMBULATORY_CARE_PROVIDER_SITE_OTHER): Payer: Medicare Other | Admitting: Cardiovascular Disease

## 2017-01-15 ENCOUNTER — Encounter: Payer: Self-pay | Admitting: Cardiovascular Disease

## 2017-01-15 VITALS — BP 125/71 | HR 63 | Ht 70.0 in | Wt 194.0 lb

## 2017-01-15 DIAGNOSIS — Z95810 Presence of automatic (implantable) cardiac defibrillator: Secondary | ICD-10-CM | POA: Diagnosis not present

## 2017-01-15 DIAGNOSIS — I25810 Atherosclerosis of coronary artery bypass graft(s) without angina pectoris: Secondary | ICD-10-CM

## 2017-01-15 DIAGNOSIS — N183 Chronic kidney disease, stage 3 unspecified: Secondary | ICD-10-CM

## 2017-01-15 DIAGNOSIS — I255 Ischemic cardiomyopathy: Secondary | ICD-10-CM

## 2017-01-15 DIAGNOSIS — I481 Persistent atrial fibrillation: Secondary | ICD-10-CM | POA: Diagnosis not present

## 2017-01-15 DIAGNOSIS — I5022 Chronic systolic (congestive) heart failure: Secondary | ICD-10-CM

## 2017-01-15 DIAGNOSIS — I4819 Other persistent atrial fibrillation: Secondary | ICD-10-CM

## 2017-01-15 NOTE — Progress Notes (Signed)
Cardiology Office Note:    Date:  01/17/2017   ID:  Kenneth Bell, DOB 1926/06/09, MRN [id number]  PCP:  Kenneth Pal, DO  Cardiologist: Kenneth Hew, MD; Kenneth Klein, MD    Referring MD: Kenneth Bell*   Chief Complaint  Patient presents with  . Follow-up  Establish device follow-up  History of Present Illness:    Kenneth Bell is a 83 y.o. male with a hx of coronary artery disease with previous bypass surgery and PCI,  congestive heart failure with depressed left ventricular systolic function, permanent atrial fibrillation s/p CRT-D device [employer]), here to establish defibrillator follow-up.  He is seeing Dr. Glenetta Bell for general cardiology care and last saw him i on October 31.  He previously saw Kenneth Bell at [place].  He moved to [facility] due to his wife's worsening dementia.  She is now a resident in the memory care unit at [facility] and he visits her every day.  His preferred name is Kenneth Bell.  His defibrillator is a Dynagen X-4 CRT-P device implanted in 2015 as an upgrade from a previous pacemaker.  Current battery longevity is estimated at 6 years.  The device is programmed VVI R due to permanent atrial fibrillation.  He has 97% biventricular pacing with a risk for presenting PVCs.  When pacing was taken down to 30 bpm there was no underlying escape rhythm, suggesting that he is device dependent.  All his leads have excellent pacing parameters, including the left ventricular lead which is programmed 1-RV with a threshold of 0.3 V at 0.5 ms.  He denies any problems with chest pain or shortness of breath either at rest or with exertion, lower extremity edema, dizziness or syncope, palpitations, leg edema, claudication or focal neurological complaints.  He has not had any bleeding problems and is on anticoagulation with warfarin.  He has not had a revascularization procedure since 2011.  Most recent estimation of left ventricular ejection fraction  by nuclear study in March 2017 was EF of 31%.  An echo performed in July 2017 described an EF of 40-45%.  Echo performed in our institution in July 2018 during a hospitalization for heart failure exacerbation also showed EF 40-45% with akinesis in the basal-mid inferior septum and mid-apical inferolateral walls.  His last cardiac catheterization was performed in May 2017, with results as below:  ? Left main patent. Diffuse LAD disease with competitive flow and distal LAD from LIMA. Mid Cx 35% with occlusion of OM 1 and 90% dCx. OM 2 occluded (perfuse via SVG). Ostial RCA 70%, dRCA 100%.  ? Patent LIMA-LAD. Widely patent SVG-OM, SVG-PDA: 40% proximal. 100% proximal SVG-RI  Past Medical History:  Diagnosis Date  . Chronic combined systolic and diastolic heart failure, NYHA class 3 (HCC)    EF has seemed to vary report-to-report. Myoview 2007 showed EF 30%, echo in 2016 7 EF 60-65% -> Echo in 08/2015 & 08/2016  40-45%  (with basal-mid inferoseptal & apical inferolateral wall), Mod P HTN (PAP ~53 mHg)  . CKD (chronic kidney disease), stage III ([place]) 08/26/2016  . COPD (chronic obstructive pulmonary disease) ([place])   . Coronary artery disease involving native heart with angina pectoris ([place])    a. MI 2002 - PCI to [place]. b. 2005: PCI to LAD, OM1 & OM2.  c. s/p CABG in 2013: LIMA to LAD, SVG to OM2, SVG to PDA and SVG to RI. ; d. Cath 2017: occluded LAD, OM1 & OM2, SVG-RI with patent LIMA-LAD, SVG-rPDA &  SVG-OM2.   . Essential hypertension   . Hyperlipidemia with target LDL less than 70   . Mild aortic insufficiency 08/28/2016   noted on Echo  . 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 Persistent atrial fibrillation ([place])   . Presence of biventricular implantable cardioverter-defibrillator (ICD) 2008  . Thyroid disease     Past Surgical History:  Procedure Laterality Date  . APPENDECTOMY    . CARDIAC  CATHETERIZATION  06/2015   Left main patent. Diffuse LAD disease with competitive flow and distal LAD from LIMA. Mid Cx 35% with occlusion of OM 1 and 90% dCx. OM 2 occluded (perfuse via SVG). Ostial RCA 70%, dRCA 100%.  Patent LIMA-LAD. Widely patent SVG-OM, SVG-PDA: 40% proximal. 100% proximal SVG-RI  . CARDIAC SURGERY    . CARPAL TUNNEL RELEASE Right 2017  . CORONARY ANGIOPLASTY WITH STENT PLACEMENT     Prior PCI to OM 2; 2005 PCI to LAD and OM1 with a Taxus DES 2.5 x 16 mm (postdilated to 3.8 mm) and PTCA of OM 2 stent  . CORONARY ARTERY BYPASS GRAFT  2013   LIMA-LAD, SVG-L1-2, SVG-PDA, SVG-RI (known occlusion of SVG RI)  . KNEE SURGERY Bilateral 1999  . NM MYOVIEW LTD  04/2015   Prior lateral infarct with no scanning. EF 31% with moderate global HK  . SHOULDER SURGERY Bilateral    Twice  . TRANSTHORACIC ECHOCARDIOGRAM  08/2015   Moderate LV dysfunction with global HK (estimated at 40 and 45%). Worse anterolateral and inferolateral wall.  . TRANSTHORACIC ECHOCARDIOGRAM  08/2016   EF 40-45%. Moderate concentric LVH. Akinesis of basal-mid inferoseptal and apical inferolateral wall.  Aortic sclerosis, no stenosis. Moderate mitral thickening with mild MR. Moderate RV dilation. Moderate TR and PR. PA pressures 53 mmHg  . VEIN BYPASS SURGERY  2013    Current Medications: Current Meds  Medication Sig  . aspirin EC 81 MG EC tablet Take 1 tablet (81 mg total) by mouth daily.  . bumetanide (BUMEX) 1 MG tablet Take 2 tablets (2 mg total) by mouth daily. MAY TAKE AN EXTRA TABLET IF NEEDED DEPENDING ON DAILY WEIGHT  . diphenhydramine-acetaminophen (TYLENOL PM) 25-500 MG TABS tablet Take 1 tablet by mouth at bedtime as needed (for sleep).  Marland Kitchen levothyroxine (SYNTHROID, LEVOTHROID) 50 MCG tablet Take 1 tablet (50 mcg total) by mouth daily.  . metoprolol succinate (TOPROL-XL) 25 MG 24 hr tablet Take 1 tablet (25 mg total) by mouth daily.  Marland Kitchen omeprazole (PRILOSEC) 20 MG capsule Take 1 capsule (20 mg  total) by mouth daily.  . potassium chloride (MICRO-K) 10 MEQ CR capsule Take 10 mEq by mouth daily.  . simvastatin (ZOCOR) 20 MG tablet Take 1 tablet (20 mg total) by mouth every evening.  . warfarin (COUMADIN) 6 MG tablet CONTINUE SAME HOME DOSE: Take 1/2 tablet by mouth on Mondays and Fridays, and 1 whole tablet per day on all other days.     Allergies:   Nitroglycerin   Social History   Socioeconomic History  . Marital status: Married    Spouse name: None  . Number of children: None  . Years of education: None  . Highest education level: None  Social Needs  . Financial resource strain: None  . Food insecurity - worry: None  . Food insecurity - inability: None  . Transportation needs - medical: None  . Transportation needs - non-medical: None  Occupational History  .  None  Tobacco Use  . Smoking status: Former Smoker    Types: Cigarettes    Last attempt to quit: 05/30/1967    Years since quitting: 49.6  . Smokeless tobacco: Never Used  Substance and Sexual Activity  . Alcohol use: Yes    Alcohol/week: 3.6 oz    Types: 6 Glasses of wine per week  . Drug use: No  . Sexual activity: None  Other Topics Concern  . None  Social History Narrative   He has been married for 66 years. They have 5 children and 10 grandchildren. 12 great-grandchildren.   He recently moved to [facility], [place] and oriented close to his daughter here. His wife has been moved into an Alzheimer's/memory unit nearby.   He currently lives with his daughter.   He has a college degree and previously worked as a self-employed Materials engineer.   He himself quit driving couple years ago because he was concerned about his reaction time. He is therefore depend upon his family or public transportation.   He quit smoking in 1969 having smoked for 20 years.   He drinks maybe 4-7 drinks alcohol/wine a week.   He does not exercise any longer because of shortness of breath and lack of energy.     Family  History: The patient's family history includes Arthritis in his mother; Heart disease in his father. ROS:   Please see the history of present illness.     All other systems reviewed and are negative.  EKGs/Labs/Other Studies Reviewed:    The following studies were reviewed today: Notes from Dr. Ellyn Hack, echo from July 2018  EKG:  EKG is ordered today.  The ekg ordered today demonstrates background atrial fibrillation with ventricular paced rhythm with positive R waves in V1 consistent with effective left ventricular pacing.  Recent Labs: 08/26/2016: ALT 22; B Natriuretic Peptide 636.9; TSH 3.425 09/09/2016: BUN 41; Creatinine, Ser 1.81; Hemoglobin 12.8; Platelets 225.0; Potassium 4.0; Sodium 138  Recent Lipid Panel No results found for: CHOL, TRIG, HDL, CHOLHDL, VLDL, LDLCALC, LDLDIRECT  Physical Exam:    VS:  BP 125/71   Pulse 63   Ht 5\' 10"  (1.778 m)   Wt 194 lb (88 kg)   BMI 27.84 kg/m     Wt Readings from Last 3 Encounters:  01/15/17 194 lb (88 kg)  12/11/16 198 lb 12.8 oz (90.2 kg)  09/13/16 195 lb 9.6 oz (88.7 kg)     GEN: Elderly but not frail-appearing, well nourished, well developed in no acute distress HEENT: Normal NECK: No JVD; No carotid bruits LYMPHATICS: No lymphadenopathy CARDIAC: RRR, paradoxically split S2, no diastolic murmurs, rubs, gallops.  2/6 early peaking systolic ejection murmur in the aortic focus RESPIRATORY:  Clear to auscultation without rales, wheezing or rhonchi  ABDOMEN: Soft, non-tender, non-distended MUSCULOSKELETAL:  No edema; No deformity  SKIN: Warm and dry NEUROLOGIC:  Alert and oriented x 3 PSYCHIATRIC:  Normal affect   ASSESSMENT:    1. Chronic systolic CHF (congestive heart failure), NYHA class 2 ([place])   2. Coronary artery disease involving coronary bypass graft of native heart without angina pectoris   3. Persistent atrial fibrillation ([place])   4. Biventricular automatic implantable cardioverter defibrillator in situ   5. CKD  (chronic kidney disease) stage 3, GFR 30-59 ml/min (HCC)    PLAN:    In order of problems listed above:  1. CHF: Appears well compensated, NYHA functional class I-2, clinically euvolemic.  He is on loop diuretics and beta-blockers,  but is not taking R AAS inhibitors, probably due to his blood pressure, may be also due to his renal problems. 2. CAD: Does not have angina pectoris.  No evidence of significant ischemia on his last functional study 3. AFib: Permanent arrhythmia, probably anticoagulated. CHADSVasc 4 (age 70, CHF, CAD), but without history of stroke or other embolic events 4. CRT-D: Now enrolled in our device clinic.  Will follow every 3 months remotely and yearly in the office. 5. CKD-3: Most recent creatinine 1.8.  Lowest creatinine this year in July 16 was 1.55, GFR 38.   Medication Adjustments/Labs and Tests Ordered: Current medicines are reviewed at length with the patient today.  Concerns regarding medicines are outlined above.  No orders of the defined types were placed in this encounter.  No orders of the defined types were placed in this encounter.   Signed, Kenneth Klein, MD  01/17/2017 6:05 PM    [place]

## 2017-01-15 NOTE — Patient Instructions (Signed)
Dr Sallyanne Kuster recommends that you continue on your current medications as directed. Please refer to the Current Medication list given to you today.  Remote monitoring is used to monitor your Pacemaker or ICD from home. This monitoring reduces the number of office visits required to check your device to one time per year. It allows [place] to keep an eye on the functioning of your device to ensure it is working properly. You are scheduled for a device check from home on Wednesday, March 6th, 2019. You may send your transmission at any time that day. If you have a wireless device, the transmission will be sent automatically. After your physician reviews your transmission, you will receive a notification with your next transmission date.  Dr Sallyanne Kuster recommends that you schedule a follow-up appointment in 12 months with a pacemaker check. You will receive a reminder letter in the mail two months in advance. If you don't receive a letter, please call our office to schedule the follow-up appointment.  If you need a refill on your cardiac medications before your next appointment, please call your pharmacy.   WHEN YOU GET HOME: - Press the 'heart button' on your machine - Call the device clinic - [phone]

## 2017-01-17 ENCOUNTER — Other Ambulatory Visit: Payer: Self-pay | Admitting: Cardiovascular Disease

## 2017-01-17 ENCOUNTER — Encounter: Payer: Self-pay | Admitting: Cardiovascular Disease

## 2017-01-27 ENCOUNTER — Telehealth: Payer: Self-pay | Admitting: Cardiology

## 2017-01-27 NOTE — Telephone Encounter (Signed)
Left message for pt to call.

## 2017-01-27 NOTE — Telephone Encounter (Signed)
New message    Patient calling with concerns about SOB.    Pt c/o Shortness Of Breath: STAT if SOB developed within the last 24 hours or pt is noticeably SOB on the phone  1. Are you currently SOB (can you hear that pt is SOB on the phone)? yes  2. How long have you been experiencing SOB? At least 2 weeks  3. Are you SOB when sitting or when up moving around? More when moving around  4. Are you currently experiencing any other symptoms? Dizziness, weak, trouble walking due to weakness

## 2017-01-28 LAB — CUP PACEART INCLINIC DEVICE CHECK
Date Time Interrogation Session: 20181205050000
HIGH POWER IMPEDANCE MEASURED VALUE: 61 Ohm
Lead Channel Impedance Value: 483 Ohm
Lead Channel Impedance Value: 537 Ohm
Lead Channel Pacing Threshold Pulse Width: 0.5 ms
Lead Channel Pacing Threshold Pulse Width: 0.5 ms
Lead Channel Sensing Intrinsic Amplitude: 5.1 mV
Lead Channel Setting Pacing Amplitude: 2.5 V
Lead Channel Setting Pacing Amplitude: 2.5 V
Lead Channel Setting Pacing Pulse Width: 0.5 ms
MDC IDC MSMT LEADCHNL LV IMPEDANCE VALUE: 645 Ohm
MDC IDC MSMT LEADCHNL LV PACING THRESHOLD AMPLITUDE: 0.3 V
MDC IDC MSMT LEADCHNL RV PACING THRESHOLD AMPLITUDE: 0.6 V
MDC IDC PG IMPLANT DT: 20150918
MDC IDC SET LEADCHNL LV PACING PULSEWIDTH: 0.5 ms
MDC IDC SET LEADCHNL LV SENSING SENSITIVITY: 1 mV
MDC IDC SET LEADCHNL RV SENSING SENSITIVITY: 0.6 mV
Pulse Gen Serial Number: 103634

## 2017-01-29 NOTE — Telephone Encounter (Signed)
Spoke with pt, he reports SOB when he opens his eyes, it is there all the time. He denies edema but does move from the bed to the chair to sleep due to SOB. He reports his weight was at 198 lb yesterday and he tries to keep it at 193 lb. Advised patient to take the extra bumex today, 3 tablets instead of 2. He has a follow up tomorrow with dr Ellyn Hack and will call back prior to that appointment if needed.

## 2017-01-30 ENCOUNTER — Ambulatory Visit (INDEPENDENT_AMBULATORY_CARE_PROVIDER_SITE_OTHER): Payer: Medicare Other | Admitting: Cardiology

## 2017-01-30 ENCOUNTER — Encounter: Payer: Self-pay | Admitting: Cardiology

## 2017-01-30 VITALS — BP 115/68 | HR 60 | Ht 70.0 in | Wt 200.0 lb

## 2017-01-30 DIAGNOSIS — I5042 Chronic combined systolic (congestive) and diastolic (congestive) heart failure: Secondary | ICD-10-CM

## 2017-01-30 DIAGNOSIS — Z95810 Presence of automatic (implantable) cardiac defibrillator: Secondary | ICD-10-CM | POA: Diagnosis not present

## 2017-01-30 DIAGNOSIS — I2510 Atherosclerotic heart disease of native coronary artery without angina pectoris: Secondary | ICD-10-CM

## 2017-01-30 DIAGNOSIS — N183 Chronic kidney disease, stage 3 unspecified: Secondary | ICD-10-CM

## 2017-01-30 DIAGNOSIS — R531 Weakness: Secondary | ICD-10-CM

## 2017-01-30 DIAGNOSIS — Z79899 Other long term (current) drug therapy: Secondary | ICD-10-CM | POA: Diagnosis not present

## 2017-01-30 DIAGNOSIS — I481 Persistent atrial fibrillation: Secondary | ICD-10-CM

## 2017-01-30 DIAGNOSIS — I255 Ischemic cardiomyopathy: Secondary | ICD-10-CM

## 2017-01-30 DIAGNOSIS — R635 Abnormal weight gain: Secondary | ICD-10-CM

## 2017-01-30 DIAGNOSIS — I4819 Other persistent atrial fibrillation: Secondary | ICD-10-CM

## 2017-01-30 MED ORDER — METOLAZONE 2.5 MG PO TABS: 2.5000 mg | ORAL_TABLET | Freq: Every day | ORAL | 6 refills | Status: DC | PRN

## 2017-01-30 NOTE — Patient Instructions (Addendum)
MEDICATION INSTRUCTIONS  --today  Take metolazone 2.5 mg today and 30 min later take a bumex tablet ( 1 tablet)  --starting tomorrow take 2.5 mg metolazone 30 min before 2 tablets of Bumex,  If weight does not go down the next day ( Saturday)  Take 2.5 mg  Metolazone 30 minutes before  3 tablets of bumex .    Keep a log weight daily -- MAY TAKE IF 3 LBS OVER DRY WEIGHT.     Labs today  BMP BNP  NEXT WEEK  BMP BEFORE OFFICE APPOINTMENT WITH EXTENDER/NP/PA   Your physician recommends that you schedule a follow-up appointment in 1 WEEK WITH EXTENDER.

## 2017-01-30 NOTE — Progress Notes (Signed)
PCP: Kenneth Pal, DO  Clinic Note: Chief Complaint  Patient presents with  . Follow-up    SOB(TAKING EXTRA BUMEX X 3 DAYS), FATIGUE, PATIENT ASKING DOES HE NEED A PACER CK AGAIN  . Cardiomyopathy    Ischemic  . Coronary Artery Disease    HPI: Kenneth Bell is a 83 y.o. male with a PMH below who presents again for 75-month follow-up for CAD, ischemic cardiomyopathy with chronic combined heart failure and recent diagnosis of atrial fibrillation. --Apparently he had called in for dyspnea episodes.  Weight had gone up.  He is a former patient of Kenneth Brooms, MD from [place]. He is a long history of coronary disease   MI 2002  -while living in [place]  PCI to LAD, OM1 and OM 2 in 2005; had catheterization in 2008. PCI in 2011.  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  Most recent Myoview in March 2017: Demonstrated prior infarct and lateral wall without inducible ischemia. EF 31% with moderate global HK.  Last cardiac cath was in May 2017:  ? Left main patent. Diffuse LAD disease with competitive flow and distal LAD from LIMA. Mid Cx 35% with occlusion of OM 1 and 90% dCx. OM 2 occluded (perfuse via SVG). Ostial RCA 70%, dRCA 100%.  ? Patent LIMA-LAD. Widely patent SVG-OM, SVG-PDA: 40% proximal. 100% proximal SVG-RI  Echo July 2017: Moderate LV dysfunction with global HK. Possibly worse in the anterolateral and inferolateral/apical segments. EF 40-45%.  Chronic Combined Systolic and Diastolic CHF  --> baseline weight is usually 191 lb (on home scale)  has not been on ACE inhibitor or ARB secondary to hypotension and chronic kidney disease.  On standing dose Bumex with Sliding Scale  PAF  (appears to be maybe more persistent)-> on warfarin and Toprol; clearly this exacerbates his heart failure when he goes in RVR.  Has history of apparently pleural effusion  status post thoracentesis. September 13, 2016 for hospital follow-up when he was admitted for heart failure exacerbation, gated by A. fib in July 2018 (actually seen by Dr. Debara Pickett and Dr. Sallyanne Kuster in the hospital) he had been diuresed -with IV Lasix. --At that time, he noted that he was feeling better since his hospital stay.  Was still noticing exertional dyspnea and weakness.  No sensation of A. fib.  -- increased his beta-blocker dose for blood pressure control. Visit: Home health set up, Set up ICD check, reviewed Sliding scale Bumex I last saw him in October for hospital follow-up --   Kenneth Bell was last seen on December 5 by Dr. Sallyanne Kuster for follow-up of his ICD device implanted in 2015 as an upgrade from pacemaker.  At that time he denied any chest pain or shortness of breath.  No bleeding issues.  No heart failure issues. -Appears to be compensated functional class I-II.  Euvolemic.  No angina.  Rate controlled A. fib.  Recent Hospitalizations: None  Studies Personally Reviewed - (if available, images/films reviewed: From Epic Chart or Care Everywhere)  none  Interval History: Kenneth Bell presents today actually a little bit flabbergasted.Marland Kitchen  He was doing very well, but now over the last couple weeks he has noted increasing weight and is unable to keep his weight down.  His weight is up to about 200 pounds, and is usually 194 here at dry weight.  Home is 191.  He  tells me that he has been taking more Bumex than normal, and has not had a good response.  He is starting to notice a little bit more dyspnea and swelling.  I suspect his volume is up. He really has no sensation of irregular heartbeats or palpitations.  Denies any chest tightness or pressure.  Just simply notes a little bit of PND and orthopnea.  He notes exertional dyspnea as well, no chest pain or pressure.  He is just a bit frustrated because usually when he takes more Bumex he has a good result.  Remainder of cardiac review of  symptoms: No palpitations, lightheadedness, dizziness, weakness or syncope/near syncope. No TIA/amaurosis fugax symptoms. No claudication.   ROS: A comprehensive was performed.  Pertinent symptoms noted above Review of Systems  Constitutional: Positive for malaise/fatigue (Tires easily). Negative for weight loss.  HENT: Positive for hearing loss. Negative for congestion and sore throat.   Respiratory: Positive for shortness of breath. Negative for cough and wheezing.   Cardiovascular: Positive for leg swelling.  Gastrointestinal: Negative for abdominal pain, constipation and heartburn.  Genitourinary: Negative for dysuria and hematuria.  Musculoskeletal: Positive for joint pain (Bilateral knee pain seems to limit his walking).  Neurological: Positive for dizziness (If he stands up too quickly).  Endo/Heme/Allergies: Bruises/bleeds easily.  Psychiatric/Behavioral: Negative for depression and memory loss (Not obviously). The patient is not nervous/anxious.   All other systems reviewed and are negative.  I have reviewed and (if needed) personally updated the patient's problem list, medications, allergies, past medical and surgical history, social and family history.   Past Medical History:  Diagnosis Date  . Chronic combined systolic and diastolic heart failure, NYHA class 3 (HCC)    EF has seemed to vary report-to-report. Myoview 2007 showed EF 30%, echo in 2016 7 EF 60-65% -> Echo in 08/2015 & 08/2016  40-45%  (with basal-mid inferoseptal & apical inferolateral wall), Mod P HTN (PAP ~53 mHg)  . CKD (chronic kidney disease), stage III ([place]) 08/26/2016  . COPD (chronic obstructive pulmonary disease) ([place])   . Coronary artery disease involving native heart with angina pectoris ([place])    a. MI 2002 - PCI to [place]. b. 2005: PCI to LAD, OM1 & OM2.  c. s/p CABG in 2013: LIMA to LAD, SVG to OM2, SVG to PDA and SVG to RI. ; d. Cath 2017: occluded LAD, OM1 & OM2, SVG-RI with patent LIMA-LAD, SVG-rPDA &  SVG-OM2.   . Essential hypertension   . Hyperlipidemia with target LDL less than 70   . Mild aortic insufficiency 08/28/2016   noted on Echo  . 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 Persistent atrial fibrillation ([place])   . Presence of biventricular implantable cardioverter-defibrillator (ICD) 2008  . Thyroid disease     Past Surgical History:  Procedure Laterality Date  . APPENDECTOMY    . CARDIAC CATHETERIZATION  06/2015   Left main patent. Diffuse LAD disease with competitive flow and distal LAD from LIMA. Mid Cx 35% with occlusion of OM 1 and 90% dCx. OM 2 occluded (perfuse via SVG). Ostial RCA 70%, dRCA 100%.  Patent LIMA-LAD. Widely patent SVG-OM, SVG-PDA: 40% proximal. 100% proximal SVG-RI  . CARDIAC SURGERY    . CARPAL TUNNEL RELEASE Right 2017  . CORONARY ANGIOPLASTY WITH STENT PLACEMENT     Prior PCI to OM 2; 2005 PCI to LAD and OM1 with a Taxus DES 2.5  x 16 mm (postdilated to 3.8 mm) and PTCA of OM 2 stent  . CORONARY ARTERY BYPASS GRAFT  2013   LIMA-LAD, SVG-L1-2, SVG-PDA, SVG-RI (known occlusion of SVG RI)  . KNEE SURGERY Bilateral 1999  . NM MYOVIEW LTD  04/2015   Prior lateral infarct with no scanning. EF 31% with moderate global HK  . SHOULDER SURGERY Bilateral    Twice  . TRANSTHORACIC ECHOCARDIOGRAM  08/2015   Moderate LV dysfunction with global HK (estimated at 40 and 45%). Worse anterolateral and inferolateral wall.  . TRANSTHORACIC ECHOCARDIOGRAM  08/2016   EF 40-45%. Moderate concentric LVH. Akinesis of basal-mid inferoseptal and apical inferolateral wall.  Aortic sclerosis, no stenosis. Moderate mitral thickening with mild MR. Moderate RV dilation. Moderate TR and PR. PA pressures 53 mmHg  . VEIN BYPASS SURGERY  2013    Current Meds  Medication Sig  . aspirin EC 81 MG EC tablet Take 1 tablet (81 mg total) by mouth daily.  . bumetanide (BUMEX) 1 MG tablet Take 2  tablets (2 mg total) by mouth daily. MAY TAKE AN EXTRA TABLET IF NEEDED DEPENDING ON DAILY WEIGHT  . diphenhydramine-acetaminophen (TYLENOL PM) 25-500 MG TABS tablet Take 1 tablet by mouth at bedtime as needed (for sleep).  Marland Kitchen levothyroxine (SYNTHROID, LEVOTHROID) 50 MCG tablet Take 1 tablet (50 mcg total) by mouth daily.  . metoprolol succinate (TOPROL-XL) 25 MG 24 hr tablet Take 1 tablet (25 mg total) by mouth daily.  Marland Kitchen omeprazole (PRILOSEC) 20 MG capsule Take 1 capsule (20 mg total) by mouth daily.  . potassium chloride (MICRO-K) 10 MEQ CR capsule Take 10 mEq by mouth daily.  . simvastatin (ZOCOR) 20 MG tablet Take 1 tablet (20 mg total) by mouth every evening.  . warfarin (COUMADIN) 6 MG tablet CONTINUE SAME HOME DOSE: Take 1/2 tablet by mouth on Mondays and Fridays, and 1 whole tablet per day on all other days.    Allergies  Allergen Reactions  . Nitroglycerin Other (See Comments)    DROPS BLOOD PRESSURE DRASTICALLY    Social History   Socioeconomic History  . Marital status: Married    Spouse name: None  . Number of children: None  . Years of education: None  . Highest education level: None  Social Needs  . Financial resource strain: None  . Food insecurity - worry: None  . Food insecurity - inability: None  . Transportation needs - medical: None  . Transportation needs - non-medical: None  Occupational History  . None  Tobacco Use  . Smoking status: Former Smoker    Types: Cigarettes    Last attempt to quit: 05/30/1967    Years since quitting: 49.7  . Smokeless tobacco: Never Used  Substance and Sexual Activity  . Alcohol use: Yes    Alcohol/week: 3.6 oz    Types: 6 Glasses of wine per week  . Drug use: No  . Sexual activity: None  Other Topics Concern  . None  Social History Narrative   He has been married for 66 years. They have 5 children and 10 grandchildren. 12 great-grandchildren.   He recently moved to [facility], [place] and oriented close to his daughter here.  His wife has been moved into an Alzheimer's/memory unit nearby.   He currently lives with his daughter.   He has a college degree and previously worked as a self-employed Materials engineer.   He himself quit driving couple years ago because he was concerned about his reaction time. He is therefore  depend upon his family or public transportation.   He quit smoking in 1969 having smoked for 20 years.   He drinks maybe 4-7 drinks alcohol/wine a week.   He does not exercise any longer because of shortness of breath and lack of energy.    family history includes Arthritis in his mother; Heart disease in his father.  Wt Readings from Last 3 Encounters:  01/30/17 200 lb (90.7 kg)  01/15/17 194 lb (88 kg)  12/11/16 198 lb 12.8 oz (90.2 kg)    PHYSICAL EXAM BP 115/68   Pulse 60   Ht 5\' 10"  (1.778 m)   Wt 200 lb (90.7 kg)   BMI 28.70 kg/m  Physical Exam  Constitutional: He is oriented to person, place, and time. He appears well-developed and well-nourished. No distress.  Well-groomed.  Healthy-appearing.  HENT:  Head: Normocephalic and atraumatic.  Eyes: EOM are normal. Pupils are equal, round, and reactive to light. No scleral icterus.  Neck: Hepatojugular reflux and JVD (12+ cm of water.) present. Carotid bruit is not present (Radiated murmur).  Cardiovascular: Normal rate, regular rhythm and normal pulses.  No extrasystoles are present. PMI is not displaced. Exam reveals no gallop.  Murmur heard.  Low-pitched harsh crescendo-decrescendo early systolic murmur is present with a grade of 2/6 at the upper right sternal border radiating to the neck. Pulmonary/Chest: Effort normal. No respiratory distress. He has no wheezes (Mild late expiratory). He has no rales (Mild interstitial sounds/crackles but no rhonchi or rales.).  No real rales, but he does have some interstitial sounds.  Abdominal: Soft. Bowel sounds are normal. He exhibits no distension. There is no tenderness. There is no  rebound.  Musculoskeletal: Normal range of motion. He exhibits edema (Closer to 2 + bilateral lower extremity).  Neurological: He is alert and oriented to person, place, and time.  Skin: Skin is warm and dry. No rash noted. No erythema.  Mild venous stasis changes  Psychiatric: He has a normal mood and affect. His behavior is normal. Judgment and thought content normal.  Nursing note and vitals reviewed.   Adult ECG Report Not checked  Other studies Reviewed: Additional studies/ records that were reviewed today include:  Recent Labs: Checked today for baseline Lab Results  Component Value Date   CREATININE 1.77 (H) 01/30/2017   BUN 37 (H) 01/30/2017   NA 142 01/30/2017   K 4.7 01/30/2017   CL 105 01/30/2017   CO2 21 01/30/2017   No results found for: CHOL, HDL, LDLCALC, LDLDIRECT, TRIG, CHOLHDL   ASSESSMENT / PLAN: Problem List Items Addressed This Visit    Biventricular ICD (implantable cardioverter-defibrillator) in place (Chronic)    He has been followed by the pacemaker clinic.  Would be nice to have thoracic impedance evaluation during his follow-up next week.      CAD s/p CABG (Chronic)    Overall this is been relatively stable, however with his now recurrence of heart failure symptoms, need to strongly consider ischemic evaluation. If he does not improve symptomatically with minor adjustments, I would evaluate with a Myoview. He is on aspirin beta-blocker and statin and has not had any anginal symptoms, simply dyspnea.      Relevant Medications   metolazone (ZAROXOLYN) 2.5 MG tablet   Chronic combined systolic and diastolic heart failure, NYHA class 3 (HCC) - Primary (Chronic)    Unfortunately, he seems to be having a slight exacerbation of his symptoms.  He has had notable weight gain and unable to keep off  the weight. His renal function was checked today along with a BNP (454).  This would suggest that he clearly is having some heart failure issues. Plan: Continue  current dose of Bumex, but will add Zaroxolyn half an hour before his Bumex.  He will begin tomorrow taking it before 2 tabs of Bumex.  If this does not work Architectural technologist, will then do 3 tabs of Bumex with metolazone.  I have asked that he did not continue to take the metolazone with Bumex until his weight is back down to baseline.  He He will need follow-up BMP prior to being seen next week with an extender.  If his symptoms did not improve, he may require hospitalization.  I would also then strongly consider an ischemic evaluation.      Relevant Medications   metolazone (ZAROXOLYN) 2.5 MG tablet   Other Relevant Orders   EKG 12-Lead (Completed)   Basic metabolic panel (Completed)   Brain natriuretic peptide (Completed)   Basic metabolic panel   CKD (chronic kidney disease), stage III (HCC) (Chronic)    With adjusting his diuretic dosing, we will check a baseline BMP today and then next week after adjusting his dosage. If his renal function is stable, and he has blood pressure, we could consider low-dose ocular reduction with ARB or ACE inhibitor once he is euvolemic.      Ischemic cardiomyopathy (Chronic)    He has been on stable regimen of beta-blocker mostly because of his atrial fibrillation.  He has not been on ACE inhibitor or ARB because his pressures have not been very high and has had some at this point I would probably hold off until we see how he does with increasing his diuretic dosing with metolazone..      Relevant Medications   metolazone (ZAROXOLYN) 2.5 MG tablet   Other Relevant Orders   EKG 12-Lead (Completed)   Basic metabolic panel (Completed)   Brain natriuretic peptide (Completed)   Basic metabolic panel   Persistent atrial fibrillation ([place]); CHA2DS2Vasc = ~4 (CHF, MI/aortic plaque, Age 76) (Chronic)    Usually this is been a nonissue for him.  He has a pacemaker as backup for his bradycardia concerns.  He is on warfarin.  Does not appear to have had a recurrence,  however would also recommend pacemaker evaluation when he comes in to see the extender (PA/NP) next week.  Would like to know if he truly is in A. fib or not.  If he is, would start with cardioversion.  Would also be nice to have evaluation of his thoracic thresholds.      Relevant Medications   metolazone (ZAROXOLYN) 2.5 MG tablet    Other Visit Diagnoses    Weakness       Relevant Orders   EKG 12-Lead (Completed)   Basic metabolic panel (Completed)   Brain natriuretic peptide (Completed)   Basic metabolic panel   Weight gain       Relevant Orders   EKG 12-Lead (Completed)   Basic metabolic panel (Completed)   Brain natriuretic peptide (Completed)   Basic metabolic panel   Medication management       Relevant Orders   Brain natriuretic peptide (Completed)      Current medicines are reviewed at length with the patient today. (+/- concerns) is it okay to take melatonin for sleep as opposed to Tylenol PM.  Can you explain sliding scale Bumex The following changes have been made:See below  Patient Instructions  MEDICATION INSTRUCTIONS  --  today  Take metolazone 2.5 mg today and 30 min later take a bumex tablet ( 1 tablet)  --starting tomorrow take 2.5 mg metolazone 30 min before 2 tablets of Bumex,  If weight does not go down the next day ( Saturday)  Take 2.5 mg  Metolazone 30 minutes before  3 tablets of bumex .    Keep a log weight daily -- MAY TAKE IF 3 LBS OVER DRY WEIGHT.     Labs today  BMP BNP  NEXT WEEK  BMP BEFORE OFFICE APPOINTMENT WITH EXTENDER/NP/PA   Your physician recommends that you schedule a follow-up appointment in 1 WEEK WITH EXTENDER.        Studies Ordered:   Orders Placed This Encounter  Procedures  . Basic metabolic panel  . Brain natriuretic peptide  . Basic metabolic panel  . EKG 12-Lead      Glenetta Hew, M.D., M.S. Interventional Cardiologist   Pager # [phone] Phone # [phone] [address]. [place], [place] [address]

## 2017-01-31 ENCOUNTER — Telehealth: Payer: Self-pay | Admitting: *Deleted

## 2017-01-31 ENCOUNTER — Other Ambulatory Visit: Payer: Self-pay | Admitting: Family Medicine

## 2017-01-31 LAB — BASIC METABOLIC PANEL
BUN/Creatinine Ratio: 21 (ref 10–24)
BUN: 37 mg/dL — ABNORMAL HIGH (ref 8–27)
CALCIUM: 10.1 mg/dL (ref 8.6–10.2)
CO2: 21 mmol/L (ref 20–29)
Chloride: 105 mmol/L (ref 96–106)
Creatinine, Ser: 1.77 mg/dL — ABNORMAL HIGH (ref 0.76–1.27)
GFR, EST AFRICAN AMERICAN: 39 mL/min/{1.73_m2} — AB (ref >59)
GFR, EST NON AFRICAN AMERICAN: 34 mL/min/{1.73_m2} — AB (ref >59)
Glucose: 97 mg/dL (ref 65–99)
POTASSIUM: 4.7 mmol/L (ref 3.5–5.2)
Sodium: 142 mmol/L (ref 134–144)

## 2017-01-31 LAB — BRAIN NATRIURETIC PEPTIDE: BNP: 458.4 pg/mL — ABNORMAL HIGH (ref 0.0–100.0)

## 2017-01-31 NOTE — Telephone Encounter (Signed)
From: Raiford Simmonds, RN  Sent: 01/30/2017 12:38 PM  To: Raiford Simmonds, RN, [facility] Device   FYI  PATIENT HAD AN APPOINTMENT TODAY , HE STATED HE WAS NOT SURE IF DEVICE WORKING IN THE PAST , HE THINKS IS WORKING NOW . HE WILL SEND A TRANSMISSION TODAY OR TOMORROW . INSTRUCTED PATIENT TO CONTACT YOU TO LET YOU KNOW THE TRANSMISSION WAS COMPLETED. IF YOU RECEIVE CAN YOU CONTACT PATIENT.  THANKS   Monitor not communicating. Kenneth Bell contacted [facility] and is under the impression that a new monitor was ordered. He does not see a cellular adapter connected to his monitor. I will reach out to [facility] with [facility] and have him assist Kenneth Bell getting his monitor communicating again. Kenneth Bell is appreciative.

## 2017-02-01 ENCOUNTER — Encounter: Payer: Self-pay | Admitting: Cardiology

## 2017-02-01 NOTE — Assessment & Plan Note (Signed)
With adjusting his diuretic dosing, we will check a baseline BMP today and then next week after adjusting his dosage. If his renal function is stable, and he has blood pressure, we could consider low-dose ocular reduction with ARB or ACE inhibitor once he is euvolemic.

## 2017-02-01 NOTE — Assessment & Plan Note (Signed)
Overall this is been relatively stable, however with his now recurrence of heart failure symptoms, need to strongly consider ischemic evaluation. If he does not improve symptomatically with minor adjustments, I would evaluate with a Myoview. He is on aspirin beta-blocker and statin and has not had any anginal symptoms, simply dyspnea.

## 2017-02-01 NOTE — Assessment & Plan Note (Signed)
Unfortunately, he seems to be having a slight exacerbation of his symptoms.  He has had notable weight gain and unable to keep off the weight. His renal function was checked today along with a BNP (454).  This would suggest that he clearly is having some heart failure issues. Plan: Continue current dose of Bumex, but will add Zaroxolyn half an hour before his Bumex.  He will begin tomorrow taking it before 2 tabs of Bumex.  If this does not work Architectural technologist, will then do 3 tabs of Bumex with metolazone.  I have asked that he did not continue to take the metolazone with Bumex until his weight is back down to baseline.  He He will need follow-up BMP prior to being seen next week with an extender.  If his symptoms did not improve, he may require hospitalization.  I would also then strongly consider an ischemic evaluation.

## 2017-02-01 NOTE — Assessment & Plan Note (Signed)
He has been followed by the pacemaker clinic.  Would be nice to have thoracic impedance evaluation during his follow-up next week.

## 2017-02-01 NOTE — Assessment & Plan Note (Signed)
He has been on stable regimen of beta-blocker mostly because of his atrial fibrillation.  He has not been on ACE inhibitor or ARB because his pressures have not been very high and has had some at this point I would probably hold off until we see how he does with increasing his diuretic dosing with metolazone.Marland Kitchen

## 2017-02-01 NOTE — Assessment & Plan Note (Signed)
Usually this is been a nonissue for him.  He has a pacemaker as backup for his bradycardia concerns.  He is on warfarin.  Does not appear to have had a recurrence, however would also recommend pacemaker evaluation when he comes in to see the extender (PA/NP) next week.  Would like to know if he truly is in A. fib or not.  If he is, would start with cardioversion.  Would also be nice to have evaluation of his thoracic thresholds.

## 2017-02-03 DIAGNOSIS — I10 Essential (primary) hypertension: Secondary | ICD-10-CM | POA: Insufficient documentation

## 2017-02-03 DIAGNOSIS — E079 Disorder of thyroid, unspecified: Secondary | ICD-10-CM | POA: Insufficient documentation

## 2017-02-03 DIAGNOSIS — I25119 Atherosclerotic heart disease of native coronary artery with unspecified angina pectoris: Secondary | ICD-10-CM | POA: Insufficient documentation

## 2017-02-03 DIAGNOSIS — J449 Chronic obstructive pulmonary disease, unspecified: Secondary | ICD-10-CM | POA: Insufficient documentation

## 2017-02-03 DIAGNOSIS — E785 Hyperlipidemia, unspecified: Secondary | ICD-10-CM | POA: Insufficient documentation

## 2017-02-06 ENCOUNTER — Ambulatory Visit (INDEPENDENT_AMBULATORY_CARE_PROVIDER_SITE_OTHER): Payer: Medicare Other | Admitting: Physician Assistant

## 2017-02-06 ENCOUNTER — Encounter: Payer: Self-pay | Admitting: Physician Assistant

## 2017-02-06 VITALS — BP 118/68 | HR 60 | Ht 70.0 in | Wt 192.6 lb

## 2017-02-06 DIAGNOSIS — I482 Chronic atrial fibrillation: Secondary | ICD-10-CM

## 2017-02-06 DIAGNOSIS — I25810 Atherosclerosis of coronary artery bypass graft(s) without angina pectoris: Secondary | ICD-10-CM

## 2017-02-06 DIAGNOSIS — E785 Hyperlipidemia, unspecified: Secondary | ICD-10-CM | POA: Diagnosis not present

## 2017-02-06 DIAGNOSIS — I10 Essential (primary) hypertension: Secondary | ICD-10-CM

## 2017-02-06 DIAGNOSIS — J449 Chronic obstructive pulmonary disease, unspecified: Secondary | ICD-10-CM

## 2017-02-06 DIAGNOSIS — I255 Ischemic cardiomyopathy: Secondary | ICD-10-CM | POA: Diagnosis not present

## 2017-02-06 DIAGNOSIS — N183 Chronic kidney disease, stage 3 unspecified: Secondary | ICD-10-CM

## 2017-02-06 DIAGNOSIS — I5042 Chronic combined systolic (congestive) and diastolic (congestive) heart failure: Secondary | ICD-10-CM | POA: Diagnosis not present

## 2017-02-06 DIAGNOSIS — I4821 Permanent atrial fibrillation: Secondary | ICD-10-CM

## 2017-02-06 DIAGNOSIS — Z95810 Presence of automatic (implantable) cardiac defibrillator: Secondary | ICD-10-CM | POA: Diagnosis not present

## 2017-02-06 DIAGNOSIS — R635 Abnormal weight gain: Secondary | ICD-10-CM | POA: Diagnosis not present

## 2017-02-06 DIAGNOSIS — R531 Weakness: Secondary | ICD-10-CM | POA: Diagnosis not present

## 2017-02-06 LAB — BASIC METABOLIC PANEL
BUN / CREAT RATIO: 26 — AB (ref 10–24)
BUN: 50 mg/dL — AB (ref 8–27)
CALCIUM: 10.3 mg/dL — AB (ref 8.6–10.2)
CO2: 26 mmol/L (ref 20–29)
Chloride: 95 mmol/L — ABNORMAL LOW (ref 96–106)
Creatinine, Ser: 1.91 mg/dL — ABNORMAL HIGH (ref 0.76–1.27)
GFR, EST AFRICAN AMERICAN: 35 mL/min/{1.73_m2} — AB (ref >59)
GFR, EST NON AFRICAN AMERICAN: 31 mL/min/{1.73_m2} — AB (ref >59)
Glucose: 117 mg/dL — ABNORMAL HIGH (ref 65–99)
Potassium: 4.6 mmol/L (ref 3.5–5.2)
Sodium: 139 mmol/L (ref 134–144)

## 2017-02-06 NOTE — Patient Instructions (Signed)
Medication Instructions:  Your physician recommends that you continue on your current medications as directed. Please refer to the Current Medication list given to you today.  Follow-Up: Your physician recommends that you schedule a follow-up appointment in: 2 months with Dr. Ellyn Hack.   Any Other Special Instructions Will Be Listed Below (If Applicable).  If weight increases by 5 lbs, call for medication instructions.   --only take metolazone on AS NEEDED basis.   Kenneth Bell, Public librarian will call you this afternoon to troubleshoot your monitor.

## 2017-02-06 NOTE — Progress Notes (Signed)
Cardiology Office Note    Date:  02/08/2017   ID:  Kenneth Bell, DOB October 04, 1926, MRN [id number]  PCP:  [name], DO  Cardiologist:  Dr. Ellyn Hack / Dr. Sallyanne Kuster (ICD)  Chief Complaint  Patient presents with  . Follow-up    followup after diuresis. Seen for Dr. [place]    History of Present Illness:  Kenneth Bell is a 83 y.o. male with PMH of chronic combined systolic and diastolic heart failure, COPD, stage III CKD, CAD s/p st CABG in 2013 (LIMA to LAD, SVG to OM 2, SVG to PDA and SVG to RI), hypertension, hyperlipidemia, mild AI, persistent atrial fibrillation on Coumadin and ICM s/p BiV ICD.  Myoview him March 2017 demonstrated prior infarct in the lateral wall without inducible ischemia, EF 31% with moderate global hypokinesis.  His last cardiac catheterization was in May 2017 which showed occluded LAD, OM1 and OM 2, occluded SVG to RI, patent LIMA to LAD, patent SVG to RPDA and patent SVG to OM 2.  His baseline weight is about 191 pounds on home scale.  He has not been on ACE inhibitor and ARB due to hypotension and chronic kidney disease.  He is taking Bumex for diuretic.  His weight was up to 200 pounds when he saw Dr. Ellyn Hack recently on 01/30/2017.  He also appears to be more dyspneic and had a swelling.  He was given metolazone for extra diuresis.  It was felt if his symptoms does not improve, he will need another ischemic workup with Myoview.  It was also recommended for the patient to have a impedance check as well.  Patient presents back for cardiology office visit today.  He has only taken 2 doses of metolazone in the past week.  The first day he took metolazone, he lost 5 pounds.  He lost additional 3 pounds after taking the second metolazone.  Today he is 8 pounds lighter than he was during the last week's visit.  He says his breathing is a lot better.  He is no longer having any shortness of breath with exertion.  He denies any recent chest pain as well.  He  does have a right shoulder pain, he says he had the same shoulder pain before and was treated by orthopedic surgery.  He described his previous angina as chest tightness.  Given the atypical nature of this right shoulder pain which is too similar to her anginal symptom, we opted for monitoring.  He said the shoulder pain does not worsen with exertion.  However it is also not worse with lifting of the arm.  We interrogated his device today, his lead impedance is normal.  His atrial fibrillation sensing mode is not on.  However he likely has been in permanent atrial fibrillation as indicated on previous interrogation.    Past Medical History:  Diagnosis Date  . Chronic combined systolic and diastolic heart failure, NYHA class 3 (HCC)    EF has seemed to vary report-to-report. Myoview 2007 showed EF 30%, echo in 2016 7 EF 60-65% -> Echo in 08/2015 & 08/2016  40-45%  (with basal-mid inferoseptal & apical inferolateral wall), Mod P HTN (PAP ~53 mHg)  . CKD (chronic kidney disease), stage III ([place]) 08/26/2016  . COPD (chronic obstructive pulmonary disease) ([place])   . Coronary artery disease involving native heart with angina pectoris ([place])    a. MI 2002 - PCI to [place]. b. 2005: PCI to LAD, OM1 & OM2.  c. s/p CABG in 2013:  LIMA to LAD, SVG to OM2, SVG to PDA and SVG to RI. ; d. Cath 2017: occluded LAD, OM1 & OM2, SVG-RI with patent LIMA-LAD, SVG-rPDA & SVG-OM2.   . Essential hypertension   . Hyperlipidemia with target LDL less than 70   . Mild aortic insufficiency 08/28/2016   noted on Echo  . 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 Persistent atrial fibrillation ([place])   . Presence of biventricular implantable cardioverter-defibrillator (ICD) 2008  . Thyroid disease     Past Surgical History:  Procedure Laterality Date  . APPENDECTOMY    . CARDIAC CATHETERIZATION  06/2015   Left main patent. Diffuse LAD disease  with competitive flow and distal LAD from LIMA. Mid Cx 35% with occlusion of OM 1 and 90% dCx. OM 2 occluded (perfuse via SVG). Ostial RCA 70%, dRCA 100%.  Patent LIMA-LAD. Widely patent SVG-OM, SVG-PDA: 40% proximal. 100% proximal SVG-RI  . CARDIAC SURGERY    . CARPAL TUNNEL RELEASE Right 2017  . CORONARY ANGIOPLASTY WITH STENT PLACEMENT     Prior PCI to OM 2; 2005 PCI to LAD and OM1 with a Taxus DES 2.5 x 16 mm (postdilated to 3.8 mm) and PTCA of OM 2 stent  . CORONARY ARTERY BYPASS GRAFT  2013   LIMA-LAD, SVG-L1-2, SVG-PDA, SVG-RI (known occlusion of SVG RI)  . KNEE SURGERY Bilateral 1999  . NM MYOVIEW LTD  04/2015   Prior lateral infarct with no scanning. EF 31% with moderate global HK  . SHOULDER SURGERY Bilateral    Twice  . TRANSTHORACIC ECHOCARDIOGRAM  08/2015   Moderate LV dysfunction with global HK (estimated at 40 and 45%). Worse anterolateral and inferolateral wall.  . TRANSTHORACIC ECHOCARDIOGRAM  08/2016   EF 40-45%. Moderate concentric LVH. Akinesis of basal-mid inferoseptal and apical inferolateral wall.  Aortic sclerosis, no stenosis. Moderate mitral thickening with mild MR. Moderate RV dilation. Moderate TR and PR. PA pressures 53 mmHg  . VEIN BYPASS SURGERY  2013    Current Medications: Outpatient Medications Prior to Visit  Medication Sig Dispense Refill  . aspirin EC 81 MG EC tablet Take 1 tablet (81 mg total) by mouth daily. 30 tablet 6  . bumetanide (BUMEX) 1 MG tablet Take 2 tablets (2 mg total) by mouth daily. MAY TAKE AN EXTRA TABLET IF NEEDED DEPENDING ON DAILY WEIGHT 225 tablet 3  . levothyroxine (SYNTHROID, LEVOTHROID) 50 MCG tablet Take 1 tablet (50 mcg total) by mouth daily. 90 tablet 1  . Melatonin 10 MG TABS Take 1 tablet by mouth at bedtime as needed.    . metoprolol succinate (TOPROL-XL) 25 MG 24 hr tablet Take 1 tablet (25 mg total) by mouth daily. 30 tablet 5  . omeprazole (PRILOSEC) 20 MG capsule Take 1 capsule (20 mg total) by mouth daily. 90 capsule  2  . potassium chloride (MICRO-K) 10 MEQ CR capsule Take 10 mEq by mouth daily.    . simvastatin (ZOCOR) 20 MG tablet TAKE 1 TABLET BY MOUTH ONCE DAILY IN THE EVENING 90 tablet 0  . warfarin (COUMADIN) 6 MG tablet CONTINUE SAME HOME DOSE: Take 1/2 tablet by mouth on Mondays and Fridays, and 1 whole tablet per day on all other days.    . diphenhydramine-acetaminophen (TYLENOL PM) 25-500 MG TABS tablet Take 1 tablet by mouth at bedtime as needed (for sleep).    . metolazone (ZAROXOLYN) 2.5 MG tablet Take 1 tablet (2.5  mg total) by mouth daily as needed. TAKE 30 MIN BEFORE TAKING BUMEX. 30 tablet 6   No facility-administered medications prior to visit.      Allergies:   Nitroglycerin   Social History   Socioeconomic History  . Marital status: Married    Spouse name: None  . Number of children: None  . Years of education: None  . Highest education level: None  Social Needs  . Financial resource strain: None  . Food insecurity - worry: None  . Food insecurity - inability: None  . Transportation needs - medical: None  . Transportation needs - non-medical: None  Occupational History  . None  Tobacco Use  . Smoking status: Former Smoker    Types: Cigarettes    Last attempt to quit: 05/30/1967    Years since quitting: 49.7  . Smokeless tobacco: Never Used  Substance and Sexual Activity  . Alcohol use: Yes    Alcohol/week: 3.6 oz    Types: 6 Glasses of wine per week  . Drug use: No  . Sexual activity: None  Other Topics Concern  . None  Social History Narrative   He has been married for 66 years. They have 5 children and 10 grandchildren. 12 great-grandchildren.   He recently moved to [facility], [place] and oriented close to his daughter here. His wife has been moved into an Alzheimer's/memory unit nearby.   He currently lives with his daughter.   He has a college degree and previously worked as a self-employed Materials engineer.   He himself quit driving couple years ago because he  was concerned about his reaction time. He is therefore depend upon his family or public transportation.   He quit smoking in 1969 having smoked for 20 years.   He drinks maybe 4-7 drinks alcohol/wine a week.   He does not exercise any longer because of shortness of breath and lack of energy.     Family History:  The patient's family history includes Arthritis in his mother; Heart disease in his father.   ROS:   Please see the history of present illness.    ROS All other systems reviewed and are negative.   PHYSICAL EXAM:   VS:  BP 118/68   Pulse 60   Ht 5\' 10"  (1.778 m)   Wt 192 lb 9.6 oz (87.4 kg)   BMI 27.64 kg/m    GEN: Well nourished, well developed, in no acute distress  HEENT: normal  Neck: no JVD, carotid bruits, or masses Cardiac: RRR; no murmurs, rubs, or gallops,no edema  Respiratory:  clear to auscultation bilaterally, normal work of breathing GI: soft, nontender, nondistended, + BS MS: no deformity or atrophy  Skin: warm and dry, no rash Neuro:  Alert and Oriented x 3, Strength and sensation are intact Psych: euthymic mood, full affect  Wt Readings from Last 3 Encounters:  02/06/17 192 lb 9.6 oz (87.4 kg)  01/30/17 200 lb (90.7 kg)  01/15/17 194 lb (88 kg)      Studies/Labs Reviewed:   EKG:  EKG is not ordered today.   Recent Labs: 08/26/2016: ALT 22; TSH 3.425 09/09/2016: Hemoglobin 12.8; Platelets 225.0 01/30/2017: BNP 458.4 02/06/2017: BUN 50; Creatinine, Ser 1.91; Potassium 4.6; Sodium 139   Lipid Panel No results found for: CHOL, TRIG, HDL, CHOLHDL, VLDL, LDLCALC, LDLDIRECT  Additional studies/ records that were reviewed today include:   Echo 08/26/2016 LV EF: 40% -   45%  ------------------------------------------------------------------- Indications:      CHF -  428.0.  ------------------------------------------------------------------- History:   PMH:   Atrial fibrillation.  Coronary artery disease. Congestive heart failure.  Risk  factors:  Dyslipidemia.  ------------------------------------------------------------------- Study Conclusions  - Left ventricle: The cavity size was normal. There was moderate   concentric hypertrophy. Systolic function was mildly to   moderately reduced. The estimated ejection fraction was in the   range of 40% to 45%. There is akinesis of the   basal-midinferoseptal myocardium. There is akinesis of the   mid-apicalinferolateral myocardium. - Aortic valve: Moderately thickened, moderately calcified   leaflets. Valve mobility was restricted. Transvalvular velocity   was minimally increased. There was no stenosis. There was mild   regurgitation. - Aorta: Aortic root dimension: 39 mm (ED). - Ascending aorta: The ascending aorta was mildly dilated. - Mitral valve: Calcified annulus. Moderately thickened leaflets .   There was mild regurgitation. - Left atrium: The atrium was moderately dilated. - Right ventricle: The cavity size was moderately dilated. Wall   thickness was normal. Pacer wire or catheter noted in right   ventricle. - Tricuspid valve: There was moderate regurgitation. - Pulmonic valve: There was moderate regurgitation. - Pulmonary arteries: Systolic pressure was moderately increased.   PA peak pressure: 53 mm Hg (S).   ASSESSMENT:    1. Chronic combined systolic and diastolic heart failure ([place])   2. Chronic obstructive pulmonary disease, unspecified COPD type ([place])   3. CKD (chronic kidney disease), stage III ([place])   4. Coronary artery disease involving coronary bypass graft of native heart without angina pectoris   5. Essential hypertension   6. Hyperlipidemia, unspecified hyperlipidemia type   7. Permanent atrial fibrillation ([place])   8. Ischemic cardiomyopathy   9. Cardiac resynchronization therapy defibrillator (CRT-D) in place      PLAN:  In order of problems listed above:  1. Chronic combined systolic and diastolic heart failure: He is breathing has  become much better after losing 8 pounds.  So far he has only taken 2 doses of metolazone, after the first dose he lost about 5 pounds overnight and he lost additional 3 pounds after the second dose.  I am concerned of dehydration, he has not taken any further metolazone since.  I asked him to take metolazone only on a as needed basis if his weight increased by more than 5 pounds off of his baseline of 191 pounds.  Since his breathing has become much better, I will hold off on Myoview at this time.  Also Dr. Allison Quarry note mentions possible cardioversion, however based on the previous interrogation report, patient likely has been staying in atrial fibrillation for extended period.  Last irrigation mentioned permanent atrial fibrillation.  The atrial fibrillation is sensing mode is not currently on, therefore I am unable to know if he is currently in atrial fibrillation.  2. CKD stage III: Obtain basic metabolic panel today  3. CAD status post CABG: We will hold off on Myoview, his breathing has significantly improved after diuresis.  4. Permanent atrial fibrillation: He will need to monitor his Coumadin level closely, he apparently has been getting his Coumadin level at his PCPs office.  5. Ischemic cardiomyopathy s/p ICD: [employer] device interrogated today.  Normal lead impedance.    Medication Adjustments/Labs and Tests Ordered: Current medicines are reviewed at length with the patient today.  Concerns regarding medicines are outlined above.  Medication changes, Labs and Tests ordered today are listed in the Patient Instructions below. Patient Instructions  Medication Instructions:  Your physician recommends that  you continue on your current medications as directed. Please refer to the Current Medication list given to you today.  Follow-Up: Your physician recommends that you schedule a follow-up appointment in: 2 months with Dr. Ellyn Hack.   Any Other Special Instructions Will Be  Listed Below (If Applicable).  If weight increases by 5 lbs, call for medication instructions.   --only take metolazone on AS NEEDED basis.   Joey, Public librarian will call you this afternoon to troubleshoot your monitor.      Hilbert Corrigan, [place]  02/08/2017 9:53 AM    [address], [place], [place]  [address] Phone: [phone]; Fax: [phone]

## 2017-02-08 ENCOUNTER — Encounter: Payer: Self-pay | Admitting: Physician Assistant

## 2017-02-10 ENCOUNTER — Telehealth: Payer: Self-pay | Admitting: Family Medicine

## 2017-02-10 NOTE — Telephone Encounter (Signed)
Pt's daughter, Bonnita Nasuti calling stating that the father has been experiencing short term memory loss a couple of times today . Pt's daughter states that he experienced difficulty putting on pants and states that he felt like his brain was not working right. Pt states that after taking a shower today the pt experienced difficulty of putting on underwear and it took him 30 min to put on underwear. Pt has not had any other symptoms but the daughter states that he has experienced some weakness in eyes. Pt's daughter is not currently at home with the pt, but has been talking with the pt on the phone. Pt's daughter states that her father is acting normal now and has no additional complaints.  Advised pt to take the pt to the ER to evaluate symptoms. Pt's daughter verbalized understanding.

## 2017-02-11 ENCOUNTER — Emergency Department (HOSPITAL_BASED_OUTPATIENT_CLINIC_OR_DEPARTMENT_OTHER)
Admission: EM | Admit: 2017-02-11 | Discharge: 2017-02-11 | Disposition: A | Discharge status: Home / Self Care | Payer: Medicare Other | Attending: Emergency Medicine | Admitting: Emergency Medicine

## 2017-02-11 ENCOUNTER — Other Ambulatory Visit: Payer: Self-pay

## 2017-02-11 ENCOUNTER — Emergency Department (HOSPITAL_BASED_OUTPATIENT_CLINIC_OR_DEPARTMENT_OTHER): Payer: Medicare Other

## 2017-02-11 ENCOUNTER — Encounter (HOSPITAL_BASED_OUTPATIENT_CLINIC_OR_DEPARTMENT_OTHER): Payer: Self-pay | Admitting: Emergency Medicine

## 2017-02-11 DIAGNOSIS — Z79899 Other long term (current) drug therapy: Secondary | ICD-10-CM | POA: Insufficient documentation

## 2017-02-11 DIAGNOSIS — I5042 Chronic combined systolic (congestive) and diastolic (congestive) heart failure: Secondary | ICD-10-CM | POA: Insufficient documentation

## 2017-02-11 DIAGNOSIS — I130 Hypertensive heart and chronic kidney disease with heart failure and stage 1 through stage 4 chronic kidney disease, or unspecified chronic kidney disease: Secondary | ICD-10-CM | POA: Insufficient documentation

## 2017-02-11 DIAGNOSIS — R410 Disorientation, unspecified: Secondary | ICD-10-CM | POA: Insufficient documentation

## 2017-02-11 DIAGNOSIS — Z87891 Personal history of nicotine dependence: Secondary | ICD-10-CM | POA: Insufficient documentation

## 2017-02-11 DIAGNOSIS — N183 Chronic kidney disease, stage 3 (moderate): Secondary | ICD-10-CM | POA: Insufficient documentation

## 2017-02-11 DIAGNOSIS — Z7982 Long term (current) use of aspirin: Secondary | ICD-10-CM | POA: Insufficient documentation

## 2017-02-11 DIAGNOSIS — J449 Chronic obstructive pulmonary disease, unspecified: Secondary | ICD-10-CM | POA: Insufficient documentation

## 2017-02-11 DIAGNOSIS — I2510 Atherosclerotic heart disease of native coronary artery without angina pectoris: Secondary | ICD-10-CM | POA: Diagnosis not present

## 2017-02-11 DIAGNOSIS — R0602 Shortness of breath: Secondary | ICD-10-CM | POA: Diagnosis not present

## 2017-02-11 HISTORY — PX: TRANSTHORACIC ECHOCARDIOGRAM: SHX275

## 2017-02-11 LAB — COMPREHENSIVE METABOLIC PANEL
ALT: 11 U/L — AB (ref 17–63)
AST: 26 U/L (ref 15–41)
Albumin: 3.5 g/dL (ref 3.5–5.0)
Alkaline Phosphatase: 86 U/L (ref 38–126)
Anion gap: 9 (ref 5–15)
BUN: 48 mg/dL — AB (ref 6–20)
CHLORIDE: 102 mmol/L (ref 101–111)
CO2: 27 mmol/L (ref 22–32)
CREATININE: 1.77 mg/dL — AB (ref 0.61–1.24)
Calcium: 9.9 mg/dL (ref 8.9–10.3)
GFR calc Af Amer: 38 mL/min — ABNORMAL LOW (ref >60)
GFR calc non Af Amer: 33 mL/min — ABNORMAL LOW (ref >60)
GLUCOSE: 123 mg/dL — AB (ref 65–99)
Potassium: 3.7 mmol/L (ref 3.5–5.1)
Sodium: 138 mmol/L (ref 135–145)
Total Bilirubin: 1 mg/dL (ref 0.3–1.2)
Total Protein: 7.6 g/dL (ref 6.5–8.1)

## 2017-02-11 LAB — DIFFERENTIAL
BASOS PCT: 0 %
Basophils Absolute: 0 10*3/uL (ref 0.0–0.1)
EOS ABS: 0.1 10*3/uL (ref 0.0–0.7)
EOS PCT: 2 %
Lymphocytes Relative: 16 %
Lymphs Abs: 1 10*3/uL (ref 0.7–4.0)
MONO ABS: 0.7 10*3/uL (ref 0.1–1.0)
MONOS PCT: 12 %
Neutro Abs: 4.2 10*3/uL (ref 1.7–7.7)
Neutrophils Relative %: 70 %

## 2017-02-11 LAB — CBC
HCT: 36.8 % — ABNORMAL LOW (ref 39.0–52.0)
Hemoglobin: 11.7 g/dL — ABNORMAL LOW (ref 13.0–17.0)
MCH: 31.2 pg (ref 26.0–34.0)
MCHC: 31.8 g/dL (ref 30.0–36.0)
MCV: 98.1 fL (ref 78.0–100.0)
Platelets: 215 10*3/uL (ref 150–400)
RBC: 3.75 MIL/uL — ABNORMAL LOW (ref 4.22–5.81)
RDW: 14.2 % (ref 11.5–15.5)
WBC: 5.9 10*3/uL (ref 4.0–10.5)

## 2017-02-11 LAB — PROTIME-INR
INR: 2.83
Prothrombin Time: 29.5 seconds — ABNORMAL HIGH (ref 11.4–15.2)

## 2017-02-11 LAB — TROPONIN I: Troponin I: 0.13 ng/mL (ref <0.03)

## 2017-02-11 LAB — APTT: aPTT: 44 seconds — ABNORMAL HIGH (ref 24–36)

## 2017-02-11 NOTE — Discharge Instructions (Signed)
Return immediately if you have return of symptoms, severe headache, or difficulty speaking or moving one side of the body

## 2017-02-11 NOTE — ED Provider Notes (Signed)
[facility] EMERGENCY DEPARTMENT Provider Note   CSN: [id number] Arrival date & time: 02/11/17  1205     History   Chief Complaint Chief Complaint  Patient presents with  . Altered Mental Status    HPI Kenneth Bell is a 84 y.o. male.  Patient is an 84 year old gentleman with a history of coronary artery disease status post bypass, CHF, chronic kidney disease, COPD, atrial fibrillation on Coumadin, aortic stenosis, cardiomyopathy presenting today with symptoms of confusion that were present yesterday.  Patient states he was normal when he woke up yesterday and sometime between breakfast and lunch he became confused.  He states he could not figure out how to put his clothes on and could not remember how to use the TV remote control and was unable to use a calculator on his phone.  These were all task he normally can do very easily.  He denied any headache, nausea, vomiting, dizziness, chest pain or shortness of breath.  Patient states when he woke up this morning symptoms had resolved.  He has never had anything like that before.  Family member who lives with the patient states his speech was normal yesterday he was able to eat normally and had no evidence of facial droop, unilateral weakness and had no difficulty walking. Patient does state that recently he started a new diuretic because his CHF was mildly exacerbated.  He took that 2 weeks ago with significant improvement but has noticed a little bit more fluid buildup which he relates to not eating well over the holidays.  He currently denies any chest pain.  He states he just feels some congestion and feels he needs to take another dose of diuretic.   The history is provided by the patient and a caregiver.  Altered Mental Status   This is a new problem. The current episode started yesterday. The problem has been resolved. Associated symptoms include confusion. Associated symptoms comments: Did not know how to use a calculator on  his phone.  Could not figure out how to put on his close.  Seemingly easy task that he would be able to do every day he could not figure out how to do.  He denied any unilateral weakness, numbness, speech disturbance, gait disturbance.. His past medical history is significant for hypertension. His past medical history does not include diabetes, CVA, TIA or dementia. Past medical history comments: chf, ckd, copd, cad s/p CABG.    Past Medical History:  Diagnosis Date  . Chronic combined systolic and diastolic heart failure, NYHA class 3 (HCC)    EF has seemed to vary report-to-report. Myoview 2007 showed EF 30%, echo in 2016 7 EF 60-65% -> Echo in 08/2015 & 08/2016  40-45%  (with basal-mid inferoseptal & apical inferolateral wall), Mod P HTN (PAP ~53 mHg)  . CKD (chronic kidney disease), stage III ([place]) 08/26/2016  . COPD (chronic obstructive pulmonary disease) ([place])   . Coronary artery disease involving native heart with angina pectoris ([place])    a. MI 2002 - PCI to [place]. b. 2005: PCI to LAD, OM1 & OM2.  c. s/p CABG in 2013: LIMA to LAD, SVG to OM2, SVG to PDA and SVG to RI. ; d. Cath 2017: occluded LAD, OM1 & OM2, SVG-RI with patent LIMA-LAD, SVG-rPDA & SVG-OM2.   . Essential hypertension   . Hyperlipidemia with target LDL less than 70   . Mild aortic insufficiency 08/28/2016   noted on Echo  . Moderate pulmonic regurgitation and right ventricular  dilation by prior echocardiogram 08/28/2016   Noted on Echo - PAP ~ 53 mHg  . Persistent atrial fibrillation ([place])   . Presence of biventricular implantable cardioverter-defibrillator (ICD) 2008  . Thyroid disease     Patient Active Problem List   Diagnosis Date Noted  . Thyroid disease   . Hyperlipidemia with target LDL less than 70   . Essential hypertension   . Coronary artery disease involving native heart with angina pectoris ([place])   . COPD (chronic obstructive pulmonary disease) ([place])   . Aortic stenosis, mild 09/13/2016  . Ischemic  cardiomyopathy 08/28/2016  . Moderate pulmonic regurgitation and right ventricular dilation by prior echocardiogram 08/28/2016  . Mild aortic insufficiency 08/28/2016  . Chronic combined systolic and diastolic heart failure, NYHA class 3 ([place]) 08/26/2016  . CKD (chronic kidney disease), stage III ([place]) 08/26/2016  . Persistent atrial fibrillation ([place]); CHA2DS2Vasc = ~4 (CHF, MI/aortic plaque, Age 74) 08/21/2016  . Biventricular ICD (implantable cardioverter-defibrillator) in place 09/13/2012  . CAD s/p CABG 08/26/2012  . Presence of biventricular implantable cardioverter-defibrillator (ICD) 02/11/2006    Past Surgical History:  Procedure Laterality Date  . APPENDECTOMY    . CARDIAC CATHETERIZATION  06/2015   Left main patent. Diffuse LAD disease with competitive flow and distal LAD from LIMA. Mid Cx 35% with occlusion of OM 1 and 90% dCx. OM 2 occluded (perfuse via SVG). Ostial RCA 70%, dRCA 100%.  Patent LIMA-LAD. Widely patent SVG-OM, SVG-PDA: 40% proximal. 100% proximal SVG-RI  . CARDIAC SURGERY    . CARPAL TUNNEL RELEASE Right 2017  . CORONARY ANGIOPLASTY WITH STENT PLACEMENT     Prior PCI to OM 2; 2005 PCI to LAD and OM1 with a Taxus DES 2.5 x 16 mm (postdilated to 3.8 mm) and PTCA of OM 2 stent  . CORONARY ARTERY BYPASS GRAFT  2013   LIMA-LAD, SVG-L1-2, SVG-PDA, SVG-RI (known occlusion of SVG RI)  . KNEE SURGERY Bilateral 1999  . NM MYOVIEW LTD  04/2015   Prior lateral infarct with no scanning. EF 31% with moderate global HK  . SHOULDER SURGERY Bilateral    Twice  . TRANSTHORACIC ECHOCARDIOGRAM  08/2015   Moderate LV dysfunction with global HK (estimated at 40 and 45%). Worse anterolateral and inferolateral wall.  . TRANSTHORACIC ECHOCARDIOGRAM  08/2016   EF 40-45%. Moderate concentric LVH. Akinesis of basal-mid inferoseptal and apical inferolateral wall.  Aortic sclerosis, no stenosis. Moderate mitral thickening with mild MR. Moderate RV dilation. Moderate TR and PR. PA  pressures 53 mmHg  . VEIN BYPASS SURGERY  2013       Home Medications    Prior to Admission medications   Medication Sig Start Date End Date Taking? Authorizing Provider  aspirin EC 81 MG EC tablet Take 1 tablet (81 mg total) by mouth daily. 08/29/16   Dunn, Nedra Hai, PA-C  bumetanide (BUMEX) 1 MG tablet Take 2 tablets (2 mg total) by mouth daily. MAY TAKE AN EXTRA TABLET IF NEEDED DEPENDING ON DAILY WEIGHT 12/11/16   Leonie Man, MD  levothyroxine (SYNTHROID, LEVOTHROID) 50 MCG tablet Take 1 tablet (50 mcg total) by mouth daily. 08/21/16   [name], DO  Melatonin 10 MG TABS Take 1 tablet by mouth at bedtime as needed.    [provider]  metoprolol succinate (TOPROL-XL) 25 MG 24 hr tablet Take 1 tablet (25 mg total) by mouth daily. 10/17/16   [name], DO  omeprazole (PRILOSEC) 20 MG capsule Take 1 capsule (20 mg total)  by mouth daily. 09/30/16   [name], DO  potassium chloride (MICRO-K) 10 MEQ CR capsule Take 10 mEq by mouth daily. 06/17/16   [provider]  simvastatin (ZOCOR) 20 MG tablet TAKE 1 TABLET BY MOUTH ONCE DAILY IN THE EVENING 02/03/17   Wendling, Crosby Oyster, DO  warfarin (COUMADIN) 6 MG tablet CONTINUE SAME HOME DOSE: Take 1/2 tablet by mouth on Mondays and Fridays, and 1 whole tablet per day on all other days. 08/28/16   Charlie Pitter, PA-C    Family History Family History  Problem Relation Age of Onset  . Arthritis Mother   . Heart disease Father     Social History Social History   Tobacco Use  . Smoking status: Former Smoker    Types: Cigarettes    Last attempt to quit: 05/30/1967    Years since quitting: 49.7  . Smokeless tobacco: Never Used  Substance Use Topics  . Alcohol use: Yes    Alcohol/week: 3.6 oz    Types: 6 Glasses of wine per week  . Drug use: No     Allergies   Nitroglycerin   Review of Systems Review of Systems  Psychiatric/Behavioral: Positive for confusion.  All other  systems reviewed and are negative.    Physical Exam Updated Vital Signs BP 127/69 (BP Location: Right Arm)   Pulse (!) 59   Temp 98.3 F (36.8 C) (Oral)   Resp 15   Ht 5\' 10"  (1.778 m)   Wt 87.1 kg (192 lb)   SpO2 98%   BMI 27.55 kg/m   Physical Exam  Constitutional: He is oriented to person, place, and time. He appears well-developed and well-nourished. No distress.  HENT:  Head: Normocephalic and atraumatic.  Mouth/Throat: Oropharynx is clear and moist.  Eyes: Conjunctivae and EOM are normal. Pupils are equal, round, and reactive to light.  Neck: Normal range of motion. Neck supple.  Cardiovascular: Normal rate and intact distal pulses. An irregularly irregular rhythm present.  No murmur heard. Pulmonary/Chest: Effort normal. No respiratory distress. He has decreased breath sounds in the right lower field and the left lower field. He has no wheezes. He has no rales.  Pacer present in the left upper chest and well healed sternotomy scar  Abdominal: Soft. He exhibits no distension. There is no tenderness. There is no rebound and no guarding.  Musculoskeletal: Normal range of motion. He exhibits no edema or tenderness.  Neurological: He is alert and oriented to person, place, and time.  5/5 strength in upper and lower ext.  Normal sensation.  Normal gait.  Normal heel to shin and no pronator drift.  EOMI and no nystagmus.  Speech is normal and comprehension is normal at this time  Skin: Skin is warm and dry. No rash noted. No erythema.  Psychiatric: He has a normal mood and affect. His behavior is normal.  Nursing note and vitals reviewed.    ED Treatments / Results  Labs (all labs ordered are listed, but only abnormal results are displayed) Labs Reviewed  COMPREHENSIVE METABOLIC PANEL - Abnormal; Notable for the following components:      Result Value   Glucose, Bld 123 (*)    BUN 48 (*)    Creatinine, Ser 1.77 (*)    ALT 11 (*)    GFR calc non Af Amer 33 (*)    GFR  calc Af Amer 38 (*)    All other components within normal limits  CBC - Abnormal; Notable for the  following components:   RBC 3.75 (*)    Hemoglobin 11.7 (*)    HCT 36.8 (*)    All other components within normal limits  TROPONIN I - Abnormal; Notable for the following components:   Troponin I 0.13 (*)    All other components within normal limits  PROTIME-INR - Abnormal; Notable for the following components:   Prothrombin Time 29.5 (*)    All other components within normal limits  APTT - Abnormal; Notable for the following components:   aPTT 44 (*)    All other components within normal limits  DIFFERENTIAL    EKG  EKG Interpretation  Date/Time:  Tuesday February 11 2017 12:21:31 EST Ventricular Rate:  60 PR Interval:    QRS Duration: 143 QT Interval:  449 QTC Calculation: 449 R Axis:   -88 Text Interpretation:  Afib/flut and V-paced complexes No further analysis attempted due to paced rhythm Baseline wander in lead(s) V3 No significant change since last tracing Confirmed by Blanchie Dessert ([id number]) on 02/11/2017 12:30:21 PM       Radiology Dg Chest 2 View  Result Date: 02/11/2017 CLINICAL DATA:  Shortness of breath. EXAM: CHEST  2 VIEW COMPARISON:  Chest x-ray dated August 26, 2016. FINDINGS: Unchanged positioning of left chest wall pacer device. Stable cardiomegaly status post CABG. Normal pulmonary vascularity. Mild bibasilar atelectasis. No focal consolidation, pleural effusion, or pneumothorax. No acute osseous abnormality. IMPRESSION: Stable cardiomegaly.  No active cardiopulmonary disease. Electronically Signed   By: Titus Dubin M.D.   On: 02/11/2017 13:11   Ct Head Wo Contrast  Result Date: 02/11/2017 CLINICAL DATA:  Confusion EXAM: CT HEAD WITHOUT CONTRAST TECHNIQUE: Contiguous axial images were obtained from the base of the skull through the vertex without intravenous contrast. COMPARISON:  None. FINDINGS: Brain: Atrophic changes and chronic white matter ischemic  changes are noted. No findings to suggest acute hemorrhage, acute infarction or space-occupying mass lesion are noted. Vascular: Prominence of the right middle cerebral artery is noted which may be related to focal aneurysm formation. Skull: Normal. Negative for fracture or focal lesion. Sinuses/Orbits: Mucosal retention cysts are noted within the sphenoid and maxillary sinuses. Other: None IMPRESSION: Chronic atrophic and ischemic changes. Prominence of the right middle cerebral artery which may be related to a aneurysm formation. Further evaluation is recommended. Electronically Signed   By: Inez Catalina M.D.   On: 02/11/2017 13:24    Procedures Procedures (including critical care time)  Medications Ordered in ED Medications - No data to display   Initial Impression / Assessment and Plan / ED Course  I have reviewed the triage vital signs and the nursing notes.  Pertinent labs & imaging results that were available during my care of the patient were reviewed by me and considered in my medical decision making (see chart for details).     Patient with multiple medical problems presenting today with symptoms concerning for TIA.  Patient currently has no symptoms but had confusion yesterday and unable to perform usually easy task yesterday.  Neuro exam is within normal limits here.  Patient does not display signs of significant fluid overload.  Patient does take Coumadin but denies any head trauma or falls recently.  EKG shows a paced rhythm.  TIA workup initiated.  2:38 PM Labs are consistent with baseline without acute findings.  Patient has a chronic troponin leak, unchanged chronic kidney disease and normal electrolytes.  Chest x-ray without acute findings.  CT of the brain showed concern for aneurysm but no  other acute findings.  When speaking with the patient he has known about a brain aneurysm for some time and shows to be anticoagulated despite this fact.  Discussed with patient the option  of being admitted for TIA workup versus following up with his doctor, Dr. Nani Ravens this week for workup.  Patient chose to follow-up with his doctor.  He is with his daughter who also is agreeable to this plan.  If symptoms recur patient will return immediately or go to [facility] for further evaluation.  Final Clinical Impressions(s) / ED Diagnoses   Final diagnoses:  Confusion    ED Discharge Orders    None       Blanchie Dessert, MD 02/11/17 1440

## 2017-02-11 NOTE — ED Notes (Signed)
ED Provider at bedside. 

## 2017-02-11 NOTE — ED Notes (Signed)
Date and time results received: 02/11/17 1343 (use smartphrase ".now" to insert current time)  Test: trp Critical Value: 0.13  Name of Provider Notified: [place] Orders Received? Or Actions Taken?: no orders given

## 2017-02-11 NOTE — ED Triage Notes (Signed)
Pt reports he experienced confusion yesterday beginning around noon. He states he could not remember how to put his pants on or use a calculator. Pt states he woke up this morning and feels better. Pt denies extremity weakness. Pt denies pain. Pt CAO x 4 at this time.

## 2017-02-12 ENCOUNTER — Ambulatory Visit (INDEPENDENT_AMBULATORY_CARE_PROVIDER_SITE_OTHER): Payer: Medicare Other | Admitting: Family Medicine

## 2017-02-12 ENCOUNTER — Encounter: Payer: Self-pay | Admitting: Family Medicine

## 2017-02-12 VITALS — BP 118/78 | HR 74 | Temp 98.3°F | Ht 70.0 in | Wt 194.1 lb

## 2017-02-12 DIAGNOSIS — R9389 Abnormal findings on diagnostic imaging of other specified body structures: Secondary | ICD-10-CM

## 2017-02-12 DIAGNOSIS — R413 Other amnesia: Secondary | ICD-10-CM | POA: Diagnosis not present

## 2017-02-12 DIAGNOSIS — I671 Cerebral aneurysm, nonruptured: Secondary | ICD-10-CM | POA: Insufficient documentation

## 2017-02-12 LAB — VITAMIN B12: VITAMIN B 12: 222 pg/mL (ref 211–911)

## 2017-02-12 LAB — TSH: TSH: 2.6 u[IU]/mL (ref 0.35–4.50)

## 2017-02-12 LAB — T4, FREE: FREE T4: 1.16 ng/dL (ref 0.60–1.60)

## 2017-02-12 NOTE — Patient Instructions (Addendum)
If you do not hear anything about your MRI in the next 1-2 weeks, call our office and ask for an update.  Give [place] 2-3 business days to get the results of your labs back. If labs are normal, you will likely receive a letter in the mail unless you have MyChart. This can take longer than 2-3 business days.   Let us know if you need anything.

## 2017-02-12 NOTE — Progress Notes (Signed)
Chief Complaint  Patient presents with  . Hospitalization Follow-up    memory loss    Subjective: Patient is a 84 y.o. male here for ER f/u.  On 12/31, the patient was trying to put some shorts on.  It took him 20 minutes to figure out how to do this as he forgot.  Throughout the day, he had similar episodes where he would forget him to do simple tasks such as change the channel on the remote.  He went to the emergency department and had a negative CT of the head.  It was recommended that he be admitted to the hospital versus following up with [place] today.  He opted for the latter.  His labs were unremarkable.  He has been doing well since this 1 episode and is doing fine otherwise.  Denies any new prescription or illicit drugs, alcohol use, or injury to the head.   ROS: Neuro: As noted in HPI Eyes: Denies vision changes  Family History  Problem Relation Age of Onset  . Arthritis Mother   . Heart disease Father    Past Medical History:  Diagnosis Date  . Brain aneurysm   . Chronic combined systolic and diastolic heart failure, NYHA class 3 (HCC)    EF has seemed to vary report-to-report. Myoview 2007 showed EF 30%, echo in 2016 7 EF 60-65% -> Echo in 08/2015 & 08/2016  40-45%  (with basal-mid inferoseptal & apical inferolateral wall), Mod P HTN (PAP ~53 mHg)  . CKD (chronic kidney disease), stage III ([place]) 08/26/2016  . COPD (chronic obstructive pulmonary disease) ([place])   . Coronary artery disease involving native heart with angina pectoris ([place])    a. MI 2002 - PCI to [place]. b. 2005: PCI to LAD, OM1 & OM2.  c. s/p CABG in 2013: LIMA to LAD, SVG to OM2, SVG to PDA and SVG to RI. ; d. Cath 2017: occluded LAD, OM1 & OM2, SVG-RI with patent LIMA-LAD, SVG-rPDA & SVG-OM2.   . Essential hypertension   . Hyperlipidemia with target LDL less than 70   . Mild aortic insufficiency 08/28/2016   noted on Echo  . Moderate pulmonic regurgitation and right ventricular dilation by prior echocardiogram  08/28/2016   Noted on Echo - PAP ~ 53 mHg  . Persistent atrial fibrillation ([place])   . Presence of biventricular implantable cardioverter-defibrillator (ICD) 2008  . Thyroid disease    Allergies  Allergen Reactions  . Nitroglycerin Other (See Comments)    DROPS BLOOD PRESSURE DRASTICALLY Other reaction(s): Other (See Comments) BP drops    Current Outpatient Medications:  .  aspirin EC 81 MG EC tablet, Take 1 tablet (81 mg total) by mouth daily., Disp: 30 tablet, Rfl: 6 .  bumetanide (BUMEX) 1 MG tablet, Take 2 tablets (2 mg total) by mouth daily. MAY TAKE AN EXTRA TABLET IF NEEDED DEPENDING ON DAILY WEIGHT, Disp: 225 tablet, Rfl: 3 .  levothyroxine (SYNTHROID, LEVOTHROID) 50 MCG tablet, Take 1 tablet (50 mcg total) by mouth daily., Disp: 90 tablet, Rfl: 1 .  Melatonin 10 MG TABS, Take 1 tablet by mouth at bedtime as needed., Disp: , Rfl:  .  metoprolol succinate (TOPROL-XL) 25 MG 24 hr tablet, Take 1 tablet (25 mg total) by mouth daily., Disp: 30 tablet, Rfl: 5 .  omeprazole (PRILOSEC) 20 MG capsule, Take 1 capsule (20 mg total) by mouth daily., Disp: 90 capsule, Rfl: 2 .  potassium chloride (MICRO-K) 10 MEQ CR capsule, Take 10 mEq by mouth daily., Disp: ,  Rfl:  .  simvastatin (ZOCOR) 20 MG tablet, TAKE 1 TABLET BY MOUTH ONCE DAILY IN THE EVENING, Disp: 90 tablet, Rfl: 0 .  warfarin (COUMADIN) 6 MG tablet, CONTINUE SAME HOME DOSE: Take 1/2 tablet by mouth on Mondays and Fridays, and 1 whole tablet per day on all other days., Disp: , Rfl:   Objective: BP 118/78 (BP Location: Left Arm, Patient Position: Sitting, Cuff Size: Normal)   Pulse 74   Temp 98.3 F (36.8 C) (Oral)   Ht 5\' 10"  (1.778 m)   Wt 194 lb 2 oz (88.1 kg)   SpO2 98%   BMI 27.85 kg/m  General: Awake, appears stated age HEENT: MMM, EOMi Heart: RRR, +SEM, no LE edema Lungs: CTAB, no rales, wheezes or rhonchi. No accessory muscle use Msk: 5/5 Strength throughout Neuro: Fluent speech, A&Ox4, sensation intact to light  touch, no cerebellar signs, CN2-12 intact Psych: Age appropriate judgment and insight, normal affect and mood  Assessment and Plan: Transient amnesia - Plan: B12, T4, free, TSH  Memory loss - Plan: MR Brain Wo Contrast  Orders as above.  We will complete laboratory and imaging workup regarding his transient loss of memory and inability to complete daily tasks.  Currently, it seems that his issues have resolved. Follow-up as needed. The patient voiced understanding and agreement to the plan.  [place], DO 02/12/17  12:05 PM

## 2017-02-12 NOTE — Progress Notes (Signed)
Pre visit review using our clinic review tool, if applicable. No additional management support is needed unless otherwise documented below in the visit note. 

## 2017-02-17 ENCOUNTER — Telehealth: Payer: Self-pay | Admitting: Family Medicine

## 2017-02-17 NOTE — Telephone Encounter (Signed)
Spoke to the daughter and patient had been doing well until last night. He was attempting to write out checks/he knew the date but was unable to write. Advise--you have appts this afternoon/or ED/or should the MRI be done?? Call back daughter Bonnita Nasuti at [phone]

## 2017-02-17 NOTE — Telephone Encounter (Signed)
Copied from [place]. Topic: Quick Communication - See Telephone Encounter >> Feb 17, 2017  8:11 AM Robina Ade, Helene Kelp D wrote: CRM for notification. See Telephone encounter for: 02/17/17. Patient daughter called Bonnita Nasuti and would like a call back from someone regarding patient, she is not sure if he needs to be seen or what she can do with patient.

## 2017-02-17 NOTE — Telephone Encounter (Signed)
Called left msg to call back. 

## 2017-02-17 NOTE — Telephone Encounter (Signed)
MRI has been ordered. If symptoms are worrisome, bring to ED. If concerning to ED team, he will be admitted and will likely not require the outpatient MRI that our team has ordered.

## 2017-02-17 NOTE — Telephone Encounter (Signed)
Spoke to the daughter after consulting with PCP. Informed PCP stated he would need a MRI next and to watch him today if still having any symptoms at all take the patient to ED [facility], they would be able to get a MRI quicker than bringing him in here.

## 2017-02-18 NOTE — Telephone Encounter (Signed)
Spoke with Radiologist about pt she stated he couldn't have MRI done because of pacemaker the leads aren't compatible for the machine.   Please advise

## 2017-02-19 ENCOUNTER — Other Ambulatory Visit: Payer: Self-pay | Admitting: Family Medicine

## 2017-02-19 DIAGNOSIS — R413 Other amnesia: Secondary | ICD-10-CM

## 2017-02-19 NOTE — Telephone Encounter (Signed)
Called left message to call back 

## 2017-02-19 NOTE — Telephone Encounter (Signed)
[  02/19/2017 10:28 AM]  Ewing, Robin:   I am with a patient sorry can you let her know due to his pace maker they cannot do a MRI he wants to know if a referral to neurology would be ok    Robin skyped the above to relay to the patient's daughter.  The daughter said a referral to a neurologist would be ok with her.  She will be waiting for the information about the neurologist.

## 2017-02-19 NOTE — Telephone Encounter (Signed)
That's unfortunate. If there are no safe options to have MRI, let's refer to neurology for their opinion. TY.

## 2017-02-19 NOTE — Telephone Encounter (Signed)
Referral done to neurology

## 2017-02-20 ENCOUNTER — Inpatient Hospital Stay (HOSPITAL_COMMUNITY)
Admission: EM | Admit: 2017-02-20 | Discharge: 2017-02-23 | DRG: 292 | Disposition: A | Discharge status: Home Health | Payer: Medicare Other | Attending: Internal Medicine | Admitting: Internal Medicine

## 2017-02-20 ENCOUNTER — Encounter (HOSPITAL_COMMUNITY): Payer: Self-pay

## 2017-02-20 ENCOUNTER — Emergency Department (HOSPITAL_COMMUNITY): Payer: Medicare Other

## 2017-02-20 DIAGNOSIS — I25810 Atherosclerosis of coronary artery bypass graft(s) without angina pectoris: Secondary | ICD-10-CM | POA: Diagnosis not present

## 2017-02-20 DIAGNOSIS — E785 Hyperlipidemia, unspecified: Secondary | ICD-10-CM | POA: Diagnosis present

## 2017-02-20 DIAGNOSIS — R29700 NIHSS score 0: Secondary | ICD-10-CM | POA: Diagnosis present

## 2017-02-20 DIAGNOSIS — Z7901 Long term (current) use of anticoagulants: Secondary | ICD-10-CM

## 2017-02-20 DIAGNOSIS — R410 Disorientation, unspecified: Secondary | ICD-10-CM | POA: Diagnosis present

## 2017-02-20 DIAGNOSIS — I481 Persistent atrial fibrillation: Secondary | ICD-10-CM

## 2017-02-20 DIAGNOSIS — N183 Chronic kidney disease, stage 3 unspecified: Secondary | ICD-10-CM | POA: Diagnosis present

## 2017-02-20 DIAGNOSIS — Z95810 Presence of automatic (implantable) cardiac defibrillator: Secondary | ICD-10-CM | POA: Diagnosis present

## 2017-02-20 DIAGNOSIS — R4701 Aphasia: Secondary | ICD-10-CM | POA: Diagnosis not present

## 2017-02-20 DIAGNOSIS — I609 Nontraumatic subarachnoid hemorrhage, unspecified: Secondary | ICD-10-CM

## 2017-02-20 DIAGNOSIS — R482 Apraxia: Secondary | ICD-10-CM | POA: Diagnosis not present

## 2017-02-20 DIAGNOSIS — I10 Essential (primary) hypertension: Secondary | ICD-10-CM | POA: Diagnosis present

## 2017-02-20 DIAGNOSIS — Z79899 Other long term (current) drug therapy: Secondary | ICD-10-CM

## 2017-02-20 DIAGNOSIS — C3490 Malignant neoplasm of unspecified part of unspecified bronchus or lung: Secondary | ICD-10-CM

## 2017-02-20 DIAGNOSIS — I671 Cerebral aneurysm, nonruptured: Secondary | ICD-10-CM | POA: Diagnosis not present

## 2017-02-20 DIAGNOSIS — I6340 Cerebral infarction due to embolism of unspecified cerebral artery: Secondary | ICD-10-CM

## 2017-02-20 DIAGNOSIS — Z7982 Long term (current) use of aspirin: Secondary | ICD-10-CM

## 2017-02-20 DIAGNOSIS — R569 Unspecified convulsions: Secondary | ICD-10-CM | POA: Diagnosis present

## 2017-02-20 DIAGNOSIS — Z87891 Personal history of nicotine dependence: Secondary | ICD-10-CM

## 2017-02-20 DIAGNOSIS — R748 Abnormal levels of other serum enzymes: Secondary | ICD-10-CM

## 2017-02-20 DIAGNOSIS — I25119 Atherosclerotic heart disease of native coronary artery with unspecified angina pectoris: Secondary | ICD-10-CM | POA: Diagnosis present

## 2017-02-20 DIAGNOSIS — R778 Other specified abnormalities of plasma proteins: Secondary | ICD-10-CM | POA: Diagnosis present

## 2017-02-20 DIAGNOSIS — I6330 Cerebral infarction due to thrombosis of unspecified cerebral artery: Secondary | ICD-10-CM

## 2017-02-20 DIAGNOSIS — I130 Hypertensive heart and chronic kidney disease with heart failure and stage 1 through stage 4 chronic kidney disease, or unspecified chronic kidney disease: Principal | ICD-10-CM | POA: Diagnosis present

## 2017-02-20 DIAGNOSIS — I5042 Chronic combined systolic (congestive) and diastolic (congestive) heart failure: Secondary | ICD-10-CM | POA: Diagnosis not present

## 2017-02-20 DIAGNOSIS — R7989 Other specified abnormal findings of blood chemistry: Secondary | ICD-10-CM | POA: Diagnosis present

## 2017-02-20 DIAGNOSIS — Z79890 Hormone replacement therapy (postmenopausal): Secondary | ICD-10-CM

## 2017-02-20 DIAGNOSIS — Z66 Do not resuscitate: Secondary | ICD-10-CM | POA: Diagnosis present

## 2017-02-20 DIAGNOSIS — Z888 Allergy status to other drugs, medicaments and biological substances status: Secondary | ICD-10-CM

## 2017-02-20 DIAGNOSIS — I4819 Other persistent atrial fibrillation: Secondary | ICD-10-CM | POA: Diagnosis present

## 2017-02-20 DIAGNOSIS — R9431 Abnormal electrocardiogram [ECG] [EKG]: Secondary | ICD-10-CM | POA: Diagnosis not present

## 2017-02-20 DIAGNOSIS — J449 Chronic obstructive pulmonary disease, unspecified: Secondary | ICD-10-CM | POA: Diagnosis present

## 2017-02-20 LAB — COMPREHENSIVE METABOLIC PANEL
ALBUMIN: 3.6 g/dL (ref 3.5–5.0)
ALK PHOS: 89 U/L (ref 38–126)
ALT: 16 U/L — ABNORMAL LOW (ref 17–63)
ANION GAP: 10 (ref 5–15)
AST: 30 U/L (ref 15–41)
BUN: 41 mg/dL — AB (ref 6–20)
CALCIUM: 10 mg/dL (ref 8.9–10.3)
CO2: 26 mmol/L (ref 22–32)
Chloride: 101 mmol/L (ref 101–111)
Creatinine, Ser: 1.78 mg/dL — ABNORMAL HIGH (ref 0.61–1.24)
GFR calc Af Amer: 38 mL/min — ABNORMAL LOW (ref >60)
GFR, EST NON AFRICAN AMERICAN: 32 mL/min — AB (ref >60)
GLUCOSE: 106 mg/dL — AB (ref 65–99)
Potassium: 4.3 mmol/L (ref 3.5–5.1)
Sodium: 137 mmol/L (ref 135–145)
TOTAL PROTEIN: 7.3 g/dL (ref 6.5–8.1)
Total Bilirubin: 1.2 mg/dL (ref 0.3–1.2)

## 2017-02-20 LAB — APTT: aPTT: 44 seconds — ABNORMAL HIGH (ref 24–36)

## 2017-02-20 LAB — DIFFERENTIAL
Basophils Absolute: 0 10*3/uL (ref 0.0–0.1)
Basophils Relative: 1 %
EOS ABS: 0.2 10*3/uL (ref 0.0–0.7)
EOS PCT: 5 %
LYMPHS ABS: 1.1 10*3/uL (ref 0.7–4.0)
LYMPHS PCT: 24 %
Monocytes Absolute: 0.3 10*3/uL (ref 0.1–1.0)
Monocytes Relative: 6 %
NEUTROS PCT: 64 %
Neutro Abs: 2.9 10*3/uL (ref 1.7–7.7)

## 2017-02-20 LAB — CBC
HCT: 39.8 % (ref 39.0–52.0)
HEMOGLOBIN: 12.6 g/dL — AB (ref 13.0–17.0)
MCH: 31 pg (ref 26.0–34.0)
MCHC: 31.7 g/dL (ref 30.0–36.0)
MCV: 98 fL (ref 78.0–100.0)
Platelets: 250 10*3/uL (ref 150–400)
RBC: 4.06 MIL/uL — AB (ref 4.22–5.81)
RDW: 14 % (ref 11.5–15.5)
WBC: 4.6 10*3/uL (ref 4.0–10.5)

## 2017-02-20 LAB — I-STAT TROPONIN, ED: TROPONIN I, POC: 0.14 ng/mL — AB (ref 0.00–0.08)

## 2017-02-20 LAB — I-STAT CHEM 8, ED
BUN: 39 mg/dL — AB (ref 6–20)
CALCIUM ION: 1.24 mmol/L (ref 1.15–1.40)
CHLORIDE: 101 mmol/L (ref 101–111)
Creatinine, Ser: 1.7 mg/dL — ABNORMAL HIGH (ref 0.61–1.24)
GLUCOSE: 104 mg/dL — AB (ref 65–99)
HCT: 39 % (ref 39.0–52.0)
Hemoglobin: 13.3 g/dL (ref 13.0–17.0)
Potassium: 4.2 mmol/L (ref 3.5–5.1)
Sodium: 140 mmol/L (ref 135–145)
TCO2: 24 mmol/L (ref 22–32)

## 2017-02-20 LAB — PROTIME-INR
INR: 2.68
Prothrombin Time: 28.3 seconds — ABNORMAL HIGH (ref 11.4–15.2)

## 2017-02-20 MED ORDER — SENNOSIDES-DOCUSATE SODIUM 8.6-50 MG PO TABS
1.0000 | ORAL_TABLET | Freq: Every evening | ORAL | Status: DC | PRN
  Administered 2017-02-23: 1 via ORAL
  Filled 2017-02-20: qty 1

## 2017-02-20 MED ORDER — LEVOTHYROXINE SODIUM 50 MCG PO TABS
50.0000 ug | ORAL_TABLET | Freq: Every day | ORAL | Status: DC
  Administered 2017-02-21 – 2017-02-23 (×3): 50 ug via ORAL
  Filled 2017-02-20 (×3): qty 1

## 2017-02-20 MED ORDER — ACETAMINOPHEN 650 MG RE SUPP: 650.0000 mg | RECTAL | Status: DC | PRN

## 2017-02-20 MED ORDER — METOPROLOL SUCCINATE ER 25 MG PO TB24
25.0000 mg | ORAL_TABLET | Freq: Every day | ORAL | Status: DC
Start: 2017-02-21 — End: 2017-02-23
  Administered 2017-02-21 – 2017-02-23 (×3): 25 mg via ORAL
  Filled 2017-02-20 (×3): qty 1

## 2017-02-20 MED ORDER — STROKE: EARLY STAGES OF RECOVERY BOOK
Freq: Once | Status: AC
  Administered 2017-02-21: 01:00:00
  Filled 2017-02-20: qty 1

## 2017-02-20 MED ORDER — PANTOPRAZOLE SODIUM 40 MG PO TBEC
40.0000 mg | DELAYED_RELEASE_TABLET | Freq: Every day | ORAL | Status: DC
  Administered 2017-02-21 – 2017-02-23 (×3): 40 mg via ORAL
  Filled 2017-02-20 (×3): qty 1

## 2017-02-20 MED ORDER — SIMVASTATIN 20 MG PO TABS: 20.0000 mg | ORAL_TABLET | Freq: Every evening | ORAL | Status: DC

## 2017-02-20 MED ORDER — ASPIRIN EC 81 MG PO TBEC
81.0000 mg | DELAYED_RELEASE_TABLET | Freq: Every day | ORAL | Status: DC
  Administered 2017-02-21 – 2017-02-23 (×3): 81 mg via ORAL
  Filled 2017-02-20 (×3): qty 1

## 2017-02-20 MED ORDER — WARFARIN SODIUM 6 MG PO TABS
6.0000 mg | ORAL_TABLET | ORAL | Status: AC
  Administered 2017-02-21: 6 mg via ORAL
  Filled 2017-02-20: qty 1

## 2017-02-20 MED ORDER — ACETAMINOPHEN 325 MG PO TABS
650.0000 mg | ORAL_TABLET | ORAL | Status: DC | PRN
  Filled 2017-02-20: qty 2

## 2017-02-20 MED ORDER — ACETAMINOPHEN 160 MG/5ML PO SOLN: 650.0000 mg | ORAL | Status: DC | PRN

## 2017-02-20 MED ORDER — WARFARIN - PHARMACIST DOSING INPATIENT
Freq: Every day | Status: DC
  Administered 2017-02-22: 18:00:00

## 2017-02-20 MED ORDER — POTASSIUM CHLORIDE CRYS ER 10 MEQ PO TBCR
10.0000 meq | EXTENDED_RELEASE_TABLET | Freq: Every day | ORAL | Status: DC
  Administered 2017-02-21 – 2017-02-23 (×3): 10 meq via ORAL
  Filled 2017-02-20 (×3): qty 1

## 2017-02-20 MED ORDER — ALBUTEROL SULFATE (2.5 MG/3ML) 0.083% IN NEBU: 2.5000 mg | INHALATION_SOLUTION | Freq: Four times a day (QID) | RESPIRATORY_TRACT | Status: DC | PRN

## 2017-02-20 MED ORDER — BUMETANIDE 2 MG PO TABS
2.0000 mg | ORAL_TABLET | Freq: Every day | ORAL | Status: DC
  Administered 2017-02-21: 2 mg via ORAL
  Filled 2017-02-20 (×2): qty 1

## 2017-02-20 NOTE — ED Notes (Signed)
EDP [place] at bedside

## 2017-02-20 NOTE — ED Notes (Signed)
PER MRI patient has had different pacemakers and leads from those pacemakers are still present, making him unable to have MRI

## 2017-02-20 NOTE — Consult Note (Signed)
Neurology Consultation  Reason for Consult: intermittent confusion Referring Physician: Dr Sherryle Lis  CC: intermittent episodes of confusion  History is obtained from: pt, daughter, family  HPI: Kenneth Bell is a 84 y.o. male who has a PMH of HTN, COPD, HLD, Afib on coumadin s/p ICD placement, CHF, AS, AR, chronic trop elevation who was brought in for evaluation of episodes of forgetfulness and inability to performs tasks of daily living that he is usually completely comfortable performing. Since about December 2018, he has had multiple episodes as an example where he had his TV remote and did not know how to turn it on. Another example, he forgot for a while or could not for a while put his pants on. These episodes spontaneously resolved. He has had episodes of confusion where he is disoriented and then those resolve too. He has also had word finding difficulty episodes, that have resolved. No headaches, visual changes. No preceding illnesses. Denies any memory problems and family also denies any progressive memory or cognitive difficulties  ROS: Review of Systems  Constitutional: Negative for chills and fever.  HENT: Negative for hearing loss and tinnitus.   Eyes: Negative for blurred vision and double vision.  Respiratory: Negative for cough and hemoptysis.   Cardiovascular: Negative for chest pain and palpitations.  Gastrointestinal: Negative for heartburn and nausea.  Genitourinary: Negative for dysuria and urgency.  Musculoskeletal: Negative for myalgias and neck pain.  Skin: Negative for itching and rash.  Neurological: Positive for speech change (And other neurological history as in HPI). Negative for dizziness, tingling, tremors, sensory change, seizures, loss of consciousness and headaches.  Endo/Heme/Allergies: Negative for environmental allergies. Does not bruise/bleed easily.  Psychiatric/Behavioral: Negative for depression and suicidal ideas.     Past Medical History:   Diagnosis Date  . Brain aneurysm   . Chronic combined systolic and diastolic heart failure, NYHA class 3 (HCC)    EF has seemed to vary report-to-report. Myoview 2007 showed EF 30%, echo in 2016 7 EF 60-65% -> Echo in 08/2015 & 08/2016  40-45%  (with basal-mid inferoseptal & apical inferolateral wall), Mod P HTN (PAP ~53 mHg)  . CKD (chronic kidney disease), stage III ([place]) 08/26/2016  . COPD (chronic obstructive pulmonary disease) ([place])   . Coronary artery disease involving native heart with angina pectoris ([place])    a. MI 2002 - PCI to [place]. b. 2005: PCI to LAD, OM1 & OM2.  c. s/p CABG in 2013: LIMA to LAD, SVG to OM2, SVG to PDA and SVG to RI. ; d. Cath 2017: occluded LAD, OM1 & OM2, SVG-RI with patent LIMA-LAD, SVG-rPDA & SVG-OM2.   . Essential hypertension   . Hyperlipidemia with target LDL less than 70   . Mild aortic insufficiency 08/28/2016   noted on Echo  . 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 Persistent atrial fibrillation ([place])   . Presence of biventricular implantable cardioverter-defibrillator (ICD) 2008  . Thyroid disease     Family History  Problem Relation Age of Onset  . Arthritis Mother   . Heart disease Father    Social History:   reports that he quit smoking about 49 years ago. His smoking use included cigarettes. he has never used smokeless tobacco. He reports that he drinks about 3.6 oz of alcohol per week. He reports that he does not use drugs.  Medications No current facility-administered medications for this encounter.   Current  Outpatient Medications:  .  aspirin EC 81 MG EC tablet, Take 1 tablet (81 mg total) by mouth daily., Disp: 30 tablet, Rfl: 6 .  bumetanide (BUMEX) 1 MG tablet, Take 2 tablets (2 mg total) by mouth daily. MAY TAKE AN EXTRA TABLET IF NEEDED DEPENDING ON DAILY WEIGHT, Disp: 225 tablet, Rfl: 3 .  levothyroxine (SYNTHROID, LEVOTHROID) 50 MCG  tablet, Take 1 tablet (50 mcg total) by mouth daily., Disp: 90 tablet, Rfl: 1 .  Melatonin 10 MG TABS, Take 1 tablet by mouth at bedtime as needed., Disp: , Rfl:  .  metoprolol succinate (TOPROL-XL) 25 MG 24 hr tablet, Take 1 tablet (25 mg total) by mouth daily., Disp: 30 tablet, Rfl: 5 .  omeprazole (PRILOSEC) 20 MG capsule, Take 1 capsule (20 mg total) by mouth daily., Disp: 90 capsule, Rfl: 2 .  potassium chloride (MICRO-K) 10 MEQ CR capsule, Take 10 mEq by mouth daily., Disp: , Rfl:  .  simvastatin (ZOCOR) 20 MG tablet, TAKE 1 TABLET BY MOUTH ONCE DAILY IN THE EVENING, Disp: 90 tablet, Rfl: 0 .  warfarin (COUMADIN) 6 MG tablet, CONTINUE SAME HOME DOSE: Take 1/2 tablet by mouth on Mondays and Fridays, and 1 whole tablet per day on all other days., Disp: , Rfl:    Exam: Current vital signs: BP 135/82 (BP Location: Right Arm)   Pulse 62   Temp 98.2 F (36.8 C)   Resp 18   Ht 5\' 10"  (1.778 m)   Wt 88 kg (194 lb)   SpO2 97%   BMI 27.84 kg/m   Vital signs in last 24 hours: Temp:  [97.4 F (36.3 C)-98.2 F (36.8 C)] 98.2 F (36.8 C) (01/10 2104) Pulse Rate:  [62] 62 (01/10 1523) Resp:  [18] 18 (01/10 1523) BP: (135)/(82) 135/82 (01/10 1523) SpO2:  [97 %] 97 % (01/10 1523) Weight:  [88 kg (194 lb)] 88 kg (194 lb) (01/10 1522) General: Awake alert in no acute distress HEENT: Normocephalic atraumatic, dry mucous membranes, no lymphadenopathy Lungs clear to auscultation bilaterally with no wheezing Cardiovascular: S1-S2 heard, irregularly irregular Abdomen: Nondistended nontender Extremities warm well perfused next line neurological exam Mental status: Alert awake oriented x3 Speech is clear.  Naming repetition fluency and calm prevention intact. Cranial nerves: Pupils equal round reactive to light, extra ocular movements intact, visual fields full, no facial asymmetry, facial sensation intact, tongue uvula and soft palate midline, sternomastoid and trapezius strength normal, no  evidence of tongue atrophy or fibrillations. Motor exam: 5/5 all over with no focal motor deficits or drift. Sensory exam: Intact to light touch all over Coordination intact finger-nose-finger bilaterally Gait exam deferred at this time. NIHSS 0 mRS 0  Labs I have reviewed labs in epic and the results pertinent to this consultation are:  CBC    Component Value Date/Time   WBC 4.6 02/20/2017 1530   RBC 4.06 (L) 02/20/2017 1530   HGB 13.3 02/20/2017 1544   HCT 39.0 02/20/2017 1544   PLT 250 02/20/2017 1530   MCV 98.0 02/20/2017 1530   MCH 31.0 02/20/2017 1530   MCHC 31.7 02/20/2017 1530   RDW 14.0 02/20/2017 1530   LYMPHSABS 1.1 02/20/2017 1530   MONOABS 0.3 02/20/2017 1530   EOSABS 0.2 02/20/2017 1530   BASOSABS 0.0 02/20/2017 1530    CMP     Component Value Date/Time   NA 140 02/20/2017 1544   NA 139 02/06/2017 0936   K 4.2 02/20/2017 1544   CL 101 02/20/2017 1544  CO2 26 02/20/2017 1530   GLUCOSE 104 (H) 02/20/2017 1544   BUN 39 (H) 02/20/2017 1544   BUN 50 (H) 02/06/2017 0936   CREATININE 1.70 (H) 02/20/2017 1544   CALCIUM 10.0 02/20/2017 1530   PROT 7.3 02/20/2017 1530   ALBUMIN 3.6 02/20/2017 1530   AST 30 02/20/2017 1530   ALT 16 (L) 02/20/2017 1530   ALKPHOS 89 02/20/2017 1530   BILITOT 1.2 02/20/2017 1530   GFRNONAA 32 (L) 02/20/2017 1530   GFRAA 38 (L) 02/20/2017 1530   Imaging I have reviewed the images obtained:  CT-scan of the brain-no acute changes.  Atrophy and chronic white matter disease.  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  Assessment:  84 year old man coming in for evaluation of intermittent episodes of confusion, inability to do his daily tasks that he otherwise is very proficient and doing, intermittent episodes of word finding difficulty, concerning for stroke/TIA. His symptoms have been off and on going on for the last at least 2 weeks. Description of his symptoms is  more suggestive of intermittent apraxia and aphasia and hence I would recommend admission for TIA workup. Basic workup for reversible causes of memory loss/dementia should also be performed.  Impression: Evaluate for TIA Evaluate for reversible causes of dementia  Recommendations: -Admit to observation -Telemetry monitoring -Allow for permissive hypertension for the first 24-48h - only treat PRN if SBP >220 mmHg. Blood pressures can be gradually normalized to SBP<140 upon discharge. -MRI brain without contrast -MRA head without contrast, MRA neck with contrast if AICD is mri compatible. Otherwise CTA head/neck -Echocardiogram -HgbA1c, fasting lipid panel -Frequent neuro checks -Prophylactic therapy-c/w coumadin for now -Atorvastatin 80 mg PO daily -Risk factor modification -PT consult, OT consult, Speech consult  -Vit b12 levels and TSH  Please page stroke NP/PA/MD (listed on AMION)  from 8am-4 pm as this patient will be followed by the stroke team at this point.  -- Amie Portland, MD [facility] Pager: [phone] If 7pm to 7am, please call on call as listed on AMION.

## 2017-02-20 NOTE — ED Provider Notes (Signed)
[facility] EMERGENCY DEPARTMENT Provider Note   CSN: [id number] Arrival date & time: 02/20/17  1509     History   Chief Complaint Chief Complaint  Patient presents with  . Altered Mental Status    HPI Kenneth Bell is a 84 y.o. male.  The history is provided by the patient, a relative and medical records.  Neurologic Problem  This is a new problem. The current episode started more than 1 week ago. Episode frequency: intermittently. The problem has not changed since onset.Pertinent negatives include no chest pain and no abdominal pain. Associated symptoms comments: Confusion. Nothing aggravates the symptoms. Nothing relieves the symptoms. He has tried nothing for the symptoms.    Past Medical History:  Diagnosis Date  . Brain aneurysm   . Chronic combined systolic and diastolic heart failure, NYHA class 3 (HCC)    EF has seemed to vary report-to-report. Myoview 2007 showed EF 30%, echo in 2016 7 EF 60-65% -> Echo in 08/2015 & 08/2016  40-45%  (with basal-mid inferoseptal & apical inferolateral wall), Mod P HTN (PAP ~53 mHg)  . CKD (chronic kidney disease), stage III ([place]) 08/26/2016  . COPD (chronic obstructive pulmonary disease) ([place])   . Coronary artery disease involving native heart with angina pectoris ([place])    a. MI 2002 - PCI to [place]. b. 2005: PCI to LAD, OM1 & OM2.  c. s/p CABG in 2013: LIMA to LAD, SVG to OM2, SVG to PDA and SVG to RI. ; d. Cath 2017: occluded LAD, OM1 & OM2, SVG-RI with patent LIMA-LAD, SVG-rPDA & SVG-OM2.   . Essential hypertension   . Hyperlipidemia with target LDL less than 70   . Mild aortic insufficiency 08/28/2016   noted on Echo  . 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 Persistent atrial fibrillation ([place])   . Presence of biventricular implantable cardioverter-defibrillator (ICD) 2008  . Thyroid disease     Patient Active Problem List    Diagnosis Date Noted  . Confusion 02/20/2017  . Brain aneurysm   . Thyroid disease   . Hyperlipidemia with target LDL less than 70   . Essential hypertension   . Coronary artery disease involving native heart with angina pectoris ([place])   . COPD (chronic obstructive pulmonary disease) ([place])   . Aortic stenosis, mild 09/13/2016  . Ischemic cardiomyopathy 08/28/2016  . Moderate pulmonic regurgitation and right ventricular dilation by prior echocardiogram 08/28/2016  . Mild aortic insufficiency 08/28/2016  . Elevated troponin   . Chronic combined systolic and diastolic heart failure, NYHA class 3 ([place]) 08/26/2016  . CKD (chronic kidney disease), stage III ([place]) 08/26/2016  . Persistent atrial fibrillation ([place]); CHA2DS2Vasc = ~4 (CHF, MI/aortic plaque, Age 62) 08/21/2016  . Biventricular ICD (implantable cardioverter-defibrillator) in place 09/13/2012  . CAD s/p CABG 08/26/2012  . Presence of biventricular implantable cardioverter-defibrillator (ICD) 02/11/2006    Past Surgical History:  Procedure Laterality Date  . APPENDECTOMY    . CARDIAC CATHETERIZATION  06/2015   Left main patent. Diffuse LAD disease with competitive flow and distal LAD from LIMA. Mid Cx 35% with occlusion of OM 1 and 90% dCx. OM 2 occluded (perfuse via SVG). Ostial RCA 70%, dRCA 100%.  Patent LIMA-LAD. Widely patent SVG-OM, SVG-PDA: 40% proximal. 100% proximal SVG-RI  . CARDIAC SURGERY    . CARPAL TUNNEL RELEASE Right 2017  . CORONARY ANGIOPLASTY WITH STENT PLACEMENT  Prior PCI to OM 2; 2005 PCI to LAD and OM1 with a Taxus DES 2.5 x 16 mm (postdilated to 3.8 mm) and PTCA of OM 2 stent  . CORONARY ARTERY BYPASS GRAFT  2013   LIMA-LAD, SVG-L1-2, SVG-PDA, SVG-RI (known occlusion of SVG RI)  . KNEE SURGERY Bilateral 1999  . NM MYOVIEW LTD  04/2015   Prior lateral infarct with no scanning. EF 31% with moderate global HK  . SHOULDER SURGERY Bilateral    Twice  . TRANSTHORACIC ECHOCARDIOGRAM  08/2015   Moderate  LV dysfunction with global HK (estimated at 40 and 45%). Worse anterolateral and inferolateral wall.  . TRANSTHORACIC ECHOCARDIOGRAM  08/2016   EF 40-45%. Moderate concentric LVH. Akinesis of basal-mid inferoseptal and apical inferolateral wall.  Aortic sclerosis, no stenosis. Moderate mitral thickening with mild MR. Moderate RV dilation. Moderate TR and PR. PA pressures 53 mmHg  . VEIN BYPASS SURGERY  2013       Home Medications    Prior to Admission medications   Medication Sig Start Date End Date Taking? Authorizing Provider  aspirin EC 81 MG EC tablet Take 1 tablet (81 mg total) by mouth daily. 08/29/16  Yes Dunn, Dayna N, PA-C  bumetanide (BUMEX) 1 MG tablet Take 2 tablets (2 mg total) by mouth daily. MAY TAKE AN EXTRA TABLET IF NEEDED DEPENDING ON DAILY WEIGHT 12/11/16  Yes Leonie Man, MD  levothyroxine (SYNTHROID, LEVOTHROID) 50 MCG tablet Take 1 tablet (50 mcg total) by mouth daily. 08/21/16  Yes [name], DO  metoprolol succinate (TOPROL-XL) 25 MG 24 hr tablet Take 1 tablet (25 mg total) by mouth daily. 10/17/16  Yes [name], DO  omeprazole (PRILOSEC) 20 MG capsule Take 1 capsule (20 mg total) by mouth daily. 09/30/16  Yes [name], DO  potassium chloride (MICRO-K) 10 MEQ CR capsule Take 10 mEq by mouth daily. 06/17/16  Yes [provider]  simvastatin (ZOCOR) 20 MG tablet TAKE 1 TABLET BY MOUTH ONCE DAILY IN THE EVENING 02/03/17  Yes Wendling, Crosby Oyster, DO  warfarin (COUMADIN) 6 MG tablet CONTINUE SAME HOME DOSE: Take 1/2 tablet by mouth on Mondays and Fridays, and 1 whole tablet per day on all other days. Patient taking differently: Take 3-6 mg by mouth See admin instructions. Take 1/2 tablet by mouth on Mondays and Fridays, then take 1 tablet all other days. 08/28/16  Yes Dunn, Nedra Hai, PA-C    Family History Family History  Problem Relation Age of Onset  . Arthritis Mother   . Heart disease Father     Social  History Social History   Tobacco Use  . Smoking status: Former Smoker    Types: Cigarettes    Last attempt to quit: 05/30/1967    Years since quitting: 49.7  . Smokeless tobacco: Never Used  Substance Use Topics  . Alcohol use: Yes    Alcohol/week: 3.6 oz    Types: 6 Glasses of wine per week  . Drug use: No     Allergies   Nitroglycerin   Review of Systems Review of Systems  Constitutional: Negative for chills and fever.  HENT: Negative for rhinorrhea and sore throat.   Eyes: Negative for visual disturbance.  Respiratory: Negative for cough.   Cardiovascular: Negative for chest pain and palpitations.  Gastrointestinal: Negative for abdominal pain and vomiting.  Genitourinary: Negative for dysuria and hematuria.  Musculoskeletal: Negative for arthralgias and back pain.  Skin: Negative for color change and rash.  Neurological:  Negative for seizures and syncope.  Psychiatric/Behavioral: Positive for confusion.  All other systems reviewed and are negative.    Physical Exam Updated Vital Signs BP 121/88   Pulse 66   Temp 98.2 F (36.8 C)   Resp 19   Ht 5\' 10"  (1.778 m)   Wt 88 kg (194 lb)   SpO2 99%   BMI 27.84 kg/m   Physical Exam  Constitutional: He is oriented to person, place, and time. He appears well-developed and well-nourished.  HENT:  Head: Normocephalic and atraumatic.  Eyes: Conjunctivae are normal.  Neck: Neck supple.  Cardiovascular: Normal rate and regular rhythm.  No murmur heard. Pulmonary/Chest: Effort normal and breath sounds normal. No respiratory distress.  Abdominal: Soft. There is no tenderness.  Musculoskeletal: He exhibits no edema.  Neurological: He is alert and oriented to person, place, and time.  CN 2-12 grossly intact, 5/5 strength in all 4ext, no focal sensory deficits, able to ambulate in the ED w/o difficulty  Skin: Skin is warm and dry.  Psychiatric: He has a normal mood and affect.  Nursing note and vitals  reviewed.    ED Treatments / Results  Labs (all labs ordered are listed, but only abnormal results are displayed) Labs Reviewed  PROTIME-INR - Abnormal; Notable for the following components:      Result Value   Prothrombin Time 28.3 (*)    All other components within normal limits  APTT - Abnormal; Notable for the following components:   aPTT 44 (*)    All other components within normal limits  CBC - Abnormal; Notable for the following components:   RBC 4.06 (*)    Hemoglobin 12.6 (*)    All other components within normal limits  COMPREHENSIVE METABOLIC PANEL - Abnormal; Notable for the following components:   Glucose, Bld 106 (*)    BUN 41 (*)    Creatinine, Ser 1.78 (*)    ALT 16 (*)    GFR calc non Af Amer 32 (*)    GFR calc Af Amer 38 (*)    All other components within normal limits  I-STAT TROPONIN, ED - Abnormal; Notable for the following components:   Troponin i, poc 0.14 (*)    All other components within normal limits  I-STAT CHEM 8, ED - Abnormal; Notable for the following components:   BUN 39 (*)    Creatinine, Ser 1.70 (*)    Glucose, Bld 104 (*)    All other components within normal limits  DIFFERENTIAL  TROPONIN I  TROPONIN I  TROPONIN I  HEMOGLOBIN A1C  LIPID PANEL  BRAIN NATRIURETIC PEPTIDE  URINALYSIS, ROUTINE W REFLEX MICROSCOPIC  PROTIME-INR  CBG MONITORING, ED    EKG  EKG Interpretation  Date/Time:  Thursday February 20 2017 15:19:54 EST Ventricular Rate:  60 PR Interval:    QRS Duration: 146 QT Interval:  498 QTC Calculation: 498 R Axis:   -93 Text Interpretation:  Ventricular-paced rhythm Biventricular pacemaker detected Abnormal ECG No significant change since last tracing Confirmed by Alfonzo Beers [phone]) on 02/20/2017 3:58:25 PM       Radiology Ct Head Wo Contrast  Result Date: 02/20/2017 CLINICAL DATA:  Subacute neuro deficit, 3 episodes of confusion in the past week, able to verbalize when he needs to do but unable to perform  actions, post unable to turn on television, dress himself or balance Hist check buttock EXAM: CT HEAD WITHOUT CONTRAST TECHNIQUE: Contiguous axial images were obtained from the base of the skull through  the vertex without intravenous contrast. Sagittal and coronal MPR images reconstructed from axial data set. COMPARISON:  02/11/2017 FINDINGS: Brain: Asymmetric positioning in gantry. Generalized atrophy. Normal ventricular morphology. No midline shift or mass effect. Small vessel chronic ischemic changes of deep cerebral white matter. No intracranial hemorrhage, mass lesion or evidence of acute infarction. No extra-axial fluid collections. Vascular: Aneurysmal dilatation of the RIGHT middle cerebral artery 9.4 mm diameter. Extensive atherosclerotic calcifications of internal carotid, middle cerebral, and vertebral arteries at skull base. Skull: Demineralized but intact Sinuses/Orbits: Question mucosal retention cysts in maxillary sinuses. Mobile opacification within the sphenoid sinus question mucous. Mastoid air cells clear. Other: N/A IMPRESSION: Atrophy with small vessel chronic ischemic changes of deep cerebral white matter. No acute intracranial abnormalities. 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 Electronically Signed   By: Lavonia Dana M.D.   On: 02/20/2017 17:01    Procedures Procedures (including critical care time)  Medications Ordered in ED Medications  aspirin EC tablet 81 mg (not administered)  bumetanide (BUMEX) tablet 2 mg (not administered)  levothyroxine (SYNTHROID, LEVOTHROID) tablet 50 mcg (not administered)  metoprolol succinate (TOPROL-XL) 24 hr tablet 25 mg (not administered)  pantoprazole (PROTONIX) EC tablet 40 mg (not administered)  potassium chloride (K-DUR,KLOR-CON) CR tablet 10 mEq (not administered)  simvastatin (ZOCOR) tablet 20 mg (not administered)  albuterol (PROVENTIL)  (2.5 MG/3ML) 0.083% nebulizer solution 2.5 mg (not administered)   stroke: mapping our early stages of recovery book (not administered)  acetaminophen (TYLENOL) tablet 650 mg (not administered)    Or  acetaminophen (TYLENOL) solution 650 mg (not administered)    Or  acetaminophen (TYLENOL) suppository 650 mg (not administered)  senna-docusate (Senokot-S) tablet 1 tablet (not administered)  warfarin (COUMADIN) tablet 6 mg (not administered)  Warfarin - Pharmacist Dosing Inpatient (not administered)     Initial Impression / Assessment and Plan / ED Course  I have reviewed the triage vital signs and the nursing notes.  Pertinent labs & imaging results that were available during my care of the patient were reviewed by me and considered in my medical decision making (see chart for details).     Pt with h/o CAD (s/p CABG), CHF, CKD3, COPD, Afib (on Coumadin), aortic stenosis, cardiomyopathy (s/p ICD placement) presents with confusion. Seen at [facility] for same issue several days ago & d/c'd b/c he wanted to pursue additional w/u with his PCP; saw his PCP who ordered an outpatient MRI. Pt has continued to have intermittent episodes since, so family brought him in for evaluation. Pt asymptomatic at the time of presentation. Symptoms sound more like apraxia than true confusion.  VS & exam as above. EKG: V-paced rhythm at 60bpm. CT head w/o obvious explanation for his symptoms. Labs with abnormalities, but they appear to be at the Pt's baseline.  Neurology consulted and evaluated the Pt in the ED; recommending admission for TIA workup.  Will admit the Pt to the hospitalist for further evaluation and treatment.  Final Clinical Impressions(s) / ED Diagnoses   Final diagnoses:  Apraxia    ED Discharge Orders    None       Jenny Reichmann, MD 02/20/17 [place], [place], MD 02/21/17 0151

## 2017-02-20 NOTE — ED Notes (Signed)
Patient is stable and ready to be transport to the floor at this time.  Report was called to 3W RN.  Belongings taken with the patient to the floor.   

## 2017-02-20 NOTE — Progress Notes (Signed)
ANTICOAGULATION CONSULT NOTE - Initial Consult  Pharmacy Consult for warfarin Indication: atrial fibrillation  Allergies  Allergen Reactions  . Nitroglycerin Other (See Comments)    DROPS BLOOD PRESSURE DRASTICALLY Other reaction(s): Other (See Comments) BP drops   Patient Measurements: Height: 5\' 10"  (177.8 cm) Weight: 194 lb (88 kg) IBW/kg (Calculated) : 73  Vital Signs: Temp: 98.2 F (36.8 C) (01/10 2104) Temp Source: Oral (01/10 1523) BP: 135/82 (01/10 1523) Pulse Rate: 62 (01/10 1523)  Labs: Recent Labs    02/20/17 1530 02/20/17 1544  HGB 12.6* 13.3  HCT 39.8 39.0  PLT 250  --   APTT 44*  --   LABPROT 28.3*  --   INR 2.68  --   CREATININE 1.78* 1.70*    Estimated Creatinine Clearance: 33.6 mL/min (A) (by C-G formula based on SCr of 1.7 mg/dL (H)).   Medical History: Past Medical History:  Diagnosis Date  . Brain aneurysm   . Chronic combined systolic and diastolic heart failure, NYHA class 3 (HCC)    EF has seemed to vary report-to-report. Myoview 2007 showed EF 30%, echo in 2016 7 EF 60-65% -> Echo in 08/2015 & 08/2016  40-45%  (with basal-mid inferoseptal & apical inferolateral wall), Mod P HTN (PAP ~53 mHg)  . CKD (chronic kidney disease), stage III ([place]) 08/26/2016  . COPD (chronic obstructive pulmonary disease) ([place])   . Coronary artery disease involving native heart with angina pectoris ([place])    a. MI 2002 - PCI to [place]. b. 2005: PCI to LAD, OM1 & OM2.  c. s/p CABG in 2013: LIMA to LAD, SVG to OM2, SVG to PDA and SVG to RI. ; d. Cath 2017: occluded LAD, OM1 & OM2, SVG-RI with patent LIMA-LAD, SVG-rPDA & SVG-OM2.   . Essential hypertension   . Hyperlipidemia with target LDL less than 70   . Mild aortic insufficiency 08/28/2016   noted on Echo  . 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 Persistent atrial fibrillation ([place])   . Presence of biventricular implantable  cardioverter-defibrillator (ICD) 2008  . Thyroid disease    Assessment:Kenneth Bell is a 84 y.o. male on warfarin PTA for atrial fibrillation. INR 2.68 in the ED. HgB 12.6, PLT; Spoke with provider in ED and he wants to continue warfarin tonight. CT negative for bleed; no anticipated procedures.  PTA warfarin dose: 3mg  on Mondays and Fridays, 6mg  on all other days  Goal of Therapy:  INR 2-3 Monitor platelets by anticoagulation protocol: Yes   Plan:  Warfarin 6mg  x1 tonight Monitor INR and CBC daily Monitor for s/sx of bleeding  Jaqulyn Chancellor L Vinh Sachs 02/20/2017,11:08 PM

## 2017-02-20 NOTE — H&P (Signed)
History and Physical    Kenneth Bell XIH:[id number] DOB: October 16, 1926 DOA: 02/20/2017  Referring MD/NP/PA:   PCP: [name], DO   Patient coming from:  The patient is coming from home.  At baseline, pt is independent for most of ADL.   Chief Complaint: confusion, forgetfulness, difficulty speaking  HPI: Kenneth Bell is a 84 y.o. male with medical history significant of hypertension, hyperlipidemia, COPD, GERD, hypothyroidism, ICD placement, atrial fibrillation on Coumadin, CAD, CABG, stent placement, CHF with EF of 40%, brain aneurysm, aortic stenosis, aortic insufficiency, chronically elevated troponin (0.11-0.13), who presents with confusion, forgetfulness and difficulty speaking.  Per her daughters, patient has been having several episodes of confusion for about 1 week. He is more forgetful. Sometime, he dose not know how to the routine things normally. Sometimes he seems to have difficulty speaking out. He has chronic hearing loss, which has not changed. Patient does not have unilateral weakness, numbness or tingling in his extremities. No vision change, facial droop. Patient denies chest pain, shortness breath, cough. No fever or chills. No nausea, vomiting, diarrhea or abdominal pain.  ED Course: pt was found to have WBC 4.6, stable renal function, trop 0.14, no fever, no tachycardia, no tachypnea, oxygen sats 97% on room air. INR 2.68, PTT 44, pending urinalysis. CT head is negative for acute intracranial abnormalities, but showed a known brain MCA aneurysm 9.4 mm. Patient is placed on telemetry bed for observation. Neurology, Dr. Rory Percy was consulted.  Review of Systems:   General: no fevers, chills, no body weight gain, has fatigue HEENT: no blurry vision, has chronic hearing changes Respiratory: no dyspnea, coughing, wheezing CV: no chest pain, no palpitations GI: no nausea, vomiting, abdominal pain, diarrhea, constipation GU: no dysuria, burning on urination,  increased urinary frequency, hematuria  Ext: no leg edema Neuro: no unilateral weakness, numbness, or tingling, no vision change. Has confusion. Skin: no rash, no skin tear. MSK: No muscle spasm, no deformity, no limitation of range of movement in spin Heme: No easy bruising.  Travel history: No recent long distant travel.  Allergy:  Allergies  Allergen Reactions  . Nitroglycerin Other (See Comments)    DROPS BLOOD PRESSURE DRASTICALLY Other reaction(s): Other (See Comments) BP drops    Past Medical History:  Diagnosis Date  . Brain aneurysm   . Chronic combined systolic and diastolic heart failure, NYHA class 3 (HCC)    EF has seemed to vary report-to-report. Myoview 2007 showed EF 30%, echo in 2016 7 EF 60-65% -> Echo in 08/2015 & 08/2016  40-45%  (with basal-mid inferoseptal & apical inferolateral wall), Mod P HTN (PAP ~53 mHg)  . CKD (chronic kidney disease), stage III ([place]) 08/26/2016  . COPD (chronic obstructive pulmonary disease) ([place])   . Coronary artery disease involving native heart with angina pectoris ([place])    a. MI 2002 - PCI to [place]. b. 2005: PCI to LAD, OM1 & OM2.  c. s/p CABG in 2013: LIMA to LAD, SVG to OM2, SVG to PDA and SVG to RI. ; d. Cath 2017: occluded LAD, OM1 & OM2, SVG-RI with patent LIMA-LAD, SVG-rPDA & SVG-OM2.   . Essential hypertension   . Hyperlipidemia with target LDL less than 70   . Mild aortic insufficiency 08/28/2016   noted on Echo  . 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 Persistent atrial fibrillation ([place])   . Presence of biventricular implantable cardioverter-defibrillator (ICD) 2008  .  Thyroid disease     Past Surgical History:  Procedure Laterality Date  . APPENDECTOMY    . CARDIAC CATHETERIZATION  06/2015   Left main patent. Diffuse LAD disease with competitive flow and distal LAD from LIMA. Mid Cx 35% with occlusion of OM 1 and 90% dCx. OM 2  occluded (perfuse via SVG). Ostial RCA 70%, dRCA 100%.  Patent LIMA-LAD. Widely patent SVG-OM, SVG-PDA: 40% proximal. 100% proximal SVG-RI  . CARDIAC SURGERY    . CARPAL TUNNEL RELEASE Right 2017  . CORONARY ANGIOPLASTY WITH STENT PLACEMENT     Prior PCI to OM 2; 2005 PCI to LAD and OM1 with a Taxus DES 2.5 x 16 mm (postdilated to 3.8 mm) and PTCA of OM 2 stent  . CORONARY ARTERY BYPASS GRAFT  2013   LIMA-LAD, SVG-L1-2, SVG-PDA, SVG-RI (known occlusion of SVG RI)  . KNEE SURGERY Bilateral 1999  . NM MYOVIEW LTD  04/2015   Prior lateral infarct with no scanning. EF 31% with moderate global HK  . SHOULDER SURGERY Bilateral    Twice  . TRANSTHORACIC ECHOCARDIOGRAM  08/2015   Moderate LV dysfunction with global HK (estimated at 40 and 45%). Worse anterolateral and inferolateral wall.  . TRANSTHORACIC ECHOCARDIOGRAM  08/2016   EF 40-45%. Moderate concentric LVH. Akinesis of basal-mid inferoseptal and apical inferolateral wall.  Aortic sclerosis, no stenosis. Moderate mitral thickening with mild MR. Moderate RV dilation. Moderate TR and PR. PA pressures 53 mmHg  . VEIN BYPASS SURGERY  2013    Social History:  reports that he quit smoking about 49 years ago. His smoking use included cigarettes. he has never used smokeless tobacco. He reports that he drinks about 3.6 oz of alcohol per week. He reports that he does not use drugs.  Family History:  Family History  Problem Relation Age of Onset  . Arthritis Mother   . Heart disease Father      Prior to Admission medications   Medication Sig Start Date End Date Taking? Authorizing Provider  aspirin EC 81 MG EC tablet Take 1 tablet (81 mg total) by mouth daily. 08/29/16   Dunn, Nedra Hai, PA-C  bumetanide (BUMEX) 1 MG tablet Take 2 tablets (2 mg total) by mouth daily. MAY TAKE AN EXTRA TABLET IF NEEDED DEPENDING ON DAILY WEIGHT 12/11/16   Leonie Man, MD  levothyroxine (SYNTHROID, LEVOTHROID) 50 MCG tablet Take 1 tablet (50 mcg total) by mouth  daily. 08/21/16   [name], DO  Melatonin 10 MG TABS Take 1 tablet by mouth at bedtime as needed.    [provider]  metoprolol succinate (TOPROL-XL) 25 MG 24 hr tablet Take 1 tablet (25 mg total) by mouth daily. 10/17/16   [name], DO  omeprazole (PRILOSEC) 20 MG capsule Take 1 capsule (20 mg total) by mouth daily. 09/30/16   [name], DO  potassium chloride (MICRO-K) 10 MEQ CR capsule Take 10 mEq by mouth daily. 06/17/16   [provider]  simvastatin (ZOCOR) 20 MG tablet TAKE 1 TABLET BY MOUTH ONCE DAILY IN THE EVENING 02/03/17   Wendling, Crosby Oyster, DO  warfarin (COUMADIN) 6 MG tablet CONTINUE SAME HOME DOSE: Take 1/2 tablet by mouth on Mondays and Fridays, and 1 whole tablet per day on all other days. 08/28/16   Charlie Pitter, PA-C    Physical Exam: Vitals:   02/20/17 2104 02/20/17 2314 02/21/17 0037 02/21/17 0509  BP:  121/88 134/73 133/72  Pulse:  66 60 (!) 103  Resp:  19 18 17   Temp: 98.2 F (36.8 C)  97.7 F (36.5 C) 98 F (36.7 C)  TempSrc:   Oral Oral  SpO2:  99% 96% 98%  Weight:      Height:       General: Not in acute distress HEENT:       Eyes: PERRL, EOMI, no scleral icterus.       ENT: No discharge from the ears and nose, no pharynx injection, no tonsillar enlargement.        Neck: No JVD, no bruit, no mass felt. Heme: No neck lymph node enlargement. Cardiac: S1/S2, RRR, No murmurs, No gallops or rubs. Respiratory: No rales, wheezing, rhonchi or rubs. GI: Soft, nondistended, nontender, no rebound pain, no organomegaly, BS present. GU: No hematuria Ext: No pitting leg edema bilaterally. 2+DP/PT pulse bilaterally. Musculoskeletal: No joint deformities, No joint redness or warmth, no limitation of ROM in spin. Skin: No rashes.  Neuro: Alert, oriented X3, cranial nerves II-XII grossly intact, moves all extremities normally. Muscle strength 5/5 in all extremities, sensation to light touch intact. Brachial  reflex 2+ bilaterally. Knee reflex 1+ bilaterally.  Psych: Patient is not psychotic, no suicidal or hemocidal ideation.  Labs on Admission: I have personally reviewed following labs and imaging studies  CBC: Recent Labs  Lab 02/20/17 1530 02/20/17 1544  WBC 4.6  --   NEUTROABS 2.9  --   HGB 12.6* 13.3  HCT 39.8 39.0  MCV 98.0  --   PLT 250  --    Basic Metabolic Panel: Recent Labs  Lab 02/20/17 1530 02/20/17 1544  NA 137 140  K 4.3 4.2  CL 101 101  CO2 26  --   GLUCOSE 106* 104*  BUN 41* 39*  CREATININE 1.78* 1.70*  CALCIUM 10.0  --    GFR: Estimated Creatinine Clearance: 33.6 mL/min (A) (by C-G formula based on SCr of 1.7 mg/dL (H)). Liver Function Tests: Recent Labs  Lab 02/20/17 1530  AST 30  ALT 16*  ALKPHOS 89  BILITOT 1.2  PROT 7.3  ALBUMIN 3.6   No results for input(s): LIPASE, AMYLASE in the last 168 hours. No results for input(s): AMMONIA in the last 168 hours. Coagulation Profile: Recent Labs  Lab 02/20/17 1530 02/21/17 0336  INR 2.68 2.76   Cardiac Enzymes: Recent Labs  Lab 02/20/17 2310  TROPONINI 0.12*   BNP (last 3 results) No results for input(s): PROBNP in the last 8760 hours. HbA1C: Recent Labs    02/21/17 0336  HGBA1C 6.3*   CBG: No results for input(s): GLUCAP in the last 168 hours. Lipid Profile: No results for input(s): CHOL, HDL, LDLCALC, TRIG, CHOLHDL, LDLDIRECT in the last 72 hours. Thyroid Function Tests: No results for input(s): TSH, T4TOTAL, FREET4, T3FREE, THYROIDAB in the last 72 hours. Anemia Panel: No results for input(s): VITAMINB12, FOLATE, FERRITIN, TIBC, IRON, RETICCTPCT in the last 72 hours. Urine analysis:    Component Value Date/Time   COLORURINE YELLOW 02/21/2017 0103   APPEARANCEUR CLEAR 02/21/2017 0103   LABSPEC 1.013 02/21/2017 0103   PHURINE 5.0 02/21/2017 0103   GLUCOSEU NEGATIVE 02/21/2017 0103   HGBUR SMALL (A) 02/21/2017 0103   BILIRUBINUR NEGATIVE 02/21/2017 0103   KETONESUR NEGATIVE  02/21/2017 0103   PROTEINUR NEGATIVE 02/21/2017 0103   NITRITE NEGATIVE 02/21/2017 0103   LEUKOCYTESUR NEGATIVE 02/21/2017 0103   Sepsis Labs: @LABRCNTIP (procalcitonin:4,lacticidven:4) )No results found for this or any previous visit (from the past 240 hour(s)).   Radiological Exams on Admission: Ct  Head Wo Contrast  Result Date: 02/20/2017 CLINICAL DATA:  Subacute neuro deficit, 3 episodes of confusion in the past week, able to verbalize when he needs to do but unable to perform actions, post unable to turn on television, dress himself or balance Hist check buttock EXAM: CT HEAD WITHOUT CONTRAST TECHNIQUE: Contiguous axial images were obtained from the base of the skull through the vertex without intravenous contrast. Sagittal and coronal MPR images reconstructed from axial data set. COMPARISON:  02/11/2017 FINDINGS: Brain: Asymmetric positioning in gantry. Generalized atrophy. Normal ventricular morphology. No midline shift or mass effect. Small vessel chronic ischemic changes of deep cerebral white matter. No intracranial hemorrhage, mass lesion or evidence of acute infarction. No extra-axial fluid collections. Vascular: Aneurysmal dilatation of the RIGHT middle cerebral artery 9.4 mm diameter. Extensive atherosclerotic calcifications of internal carotid, middle cerebral, and vertebral arteries at skull base. Skull: Demineralized but intact Sinuses/Orbits: Question mucosal retention cysts in maxillary sinuses. Mobile opacification within the sphenoid sinus question mucous. Mastoid air cells clear. Other: N/A IMPRESSION: Atrophy with small vessel chronic ischemic changes of deep cerebral white matter. No acute intracranial abnormalities. 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 Electronically Signed   By: Lavonia Dana M.D.   On: 02/20/2017 17:01     EKG: Independently reviewed.  Pacer rhythm, QTc  498  Assessment/Plan Principal Problem:   Confusion Active Problems:   Persistent atrial fibrillation (HCC); CHA2DS2Vasc = ~4 (CHF, MI/aortic plaque, Age 52)   Chronic combined systolic and diastolic heart failure, NYHA class 3 (HCC)   CKD (chronic kidney disease), stage III (HCC)   Elevated troponin   Presence of biventricular implantable cardioverter-defibrillator (ICD)   Hyperlipidemia with target LDL less than 70   Essential hypertension   Coronary artery disease involving native heart with angina pectoris (HCC)   COPD (chronic obstructive pulmonary disease) ([place])   Brain aneurysm   Cerebral thrombosis with cerebral infarction   Cerebral embolism with cerebral infarction   Subarachnoid hemorrhage   Confusion, forgetfulness and possible difficulty speaking: Etiology is not clear. CT head is negative for acute intracranial abnormalities. His breathing aneurysm seems to be stable in the size. Dr. Rory Percy was consulted, he recommended stroke workup. Since patient has ICD, not sure if patient can do MRI/MRA. Due to CKD-III, I will hold off CTA. Will need clarification in AM to see if pt his ICD is OK for MRI.   -will place tele bed for obs -will get 2d echo -start lipitor 80 mg per Dr. Rory Percy -HgbA1c, fasting lipid panel, TSH and Vb12 -Frequent neuro checks -Prophylactic therapy-c/w coumadin for now -PT consult, OT consult, Speech consult  Persistent atrial fibrillation ([place]): CHA2DS2Vasc = ~4 (CHF, MI/aortic plaque, Age 70) -continue Coumadin and metoprolol  CKD (chronic kidney disease), stage III ([place]): stable. Baseline creatinine 1.7-1.9. His creatinine is 1.7, BUN 39. -Follow up renal function by BMP  Elevated troponin and hx of CAD: s/p of stent and CABG. Trop 0.14 which is at his baseline. He has chronic troponin elevation at 0.11-0.13. No chest pain. -Continue aspirin, Lipitor  Hyperlipidemia with target LDL less than 70: -Lipitor  HTN:  -Continue home medications:  Metoprolol -Patient is on Bumex -IV hydralazine prn  COPD (chronic obstructive pulmonary disease) ([place]): Stable. -When necessary albuterol nebulizers  Brain aneurysm: -May need to f/u with neurosurgen  [place]) systolic and diastolic congestive heart failure: 2-D echo on 08/26/16 showed showed EF of 40-45 percent. Patient does not have  leg edema. No JVD. CHF is compensated. -Continue Bumex   DVT ppx: on Coumadin Code Status: DNR(I discussed with patient in the presence of his 2 daughters, and explained the meaning of CODE STATUS. Patient wants to be DNR). Family Communication:  Yes, patient's 2 daughters at bed side Disposition Plan:  Anticipate discharge back to previous home environment Consults called:  Neuro, dr. Rory Percy Admission status: Obs / tele   Date of Service 02/21/2017    Ivor Costa [facility] Pager [phone]  If 7PM-7AM, please contact night-coverage [email or web address] Password [facility] 02/21/2017, 5:21 AM

## 2017-02-20 NOTE — ED Triage Notes (Signed)
Per Pt, Pt is coming from home where he has had three episodes in the last week where he has had confusion. Pt reports that he is able to verbalize what he needs to do, but is not able to go through the motions. Pt reports that he forgot how to balance his checkbook, turn on the TV, and dress himself during these episodes. Pt reports it starting at 1000 this morning. Denies any unilateral weakness or pain.

## 2017-02-21 ENCOUNTER — Observation Stay (HOSPITAL_BASED_OUTPATIENT_CLINIC_OR_DEPARTMENT_OTHER): Payer: Medicare Other

## 2017-02-21 ENCOUNTER — Observation Stay (HOSPITAL_COMMUNITY): Payer: Medicare Other

## 2017-02-21 ENCOUNTER — Encounter (HOSPITAL_COMMUNITY): Payer: Self-pay

## 2017-02-21 DIAGNOSIS — R569 Unspecified convulsions: Secondary | ICD-10-CM | POA: Diagnosis present

## 2017-02-21 DIAGNOSIS — R29700 NIHSS score 0: Secondary | ICD-10-CM | POA: Diagnosis present

## 2017-02-21 DIAGNOSIS — I609 Nontraumatic subarachnoid hemorrhage, unspecified: Secondary | ICD-10-CM

## 2017-02-21 DIAGNOSIS — R482 Apraxia: Secondary | ICD-10-CM

## 2017-02-21 DIAGNOSIS — R748 Abnormal levels of other serum enzymes: Secondary | ICD-10-CM | POA: Diagnosis not present

## 2017-02-21 DIAGNOSIS — I130 Hypertensive heart and chronic kidney disease with heart failure and stage 1 through stage 4 chronic kidney disease, or unspecified chronic kidney disease: Secondary | ICD-10-CM | POA: Diagnosis present

## 2017-02-21 DIAGNOSIS — I6330 Cerebral infarction due to thrombosis of unspecified cerebral artery: Secondary | ICD-10-CM

## 2017-02-21 DIAGNOSIS — R410 Disorientation, unspecified: Secondary | ICD-10-CM | POA: Diagnosis not present

## 2017-02-21 DIAGNOSIS — Z79890 Hormone replacement therapy (postmenopausal): Secondary | ICD-10-CM | POA: Diagnosis not present

## 2017-02-21 DIAGNOSIS — E785 Hyperlipidemia, unspecified: Secondary | ICD-10-CM | POA: Diagnosis present

## 2017-02-21 DIAGNOSIS — Z79899 Other long term (current) drug therapy: Secondary | ICD-10-CM | POA: Diagnosis not present

## 2017-02-21 DIAGNOSIS — J449 Chronic obstructive pulmonary disease, unspecified: Secondary | ICD-10-CM | POA: Diagnosis present

## 2017-02-21 DIAGNOSIS — Z7901 Long term (current) use of anticoagulants: Secondary | ICD-10-CM | POA: Diagnosis not present

## 2017-02-21 DIAGNOSIS — I371 Nonrheumatic pulmonary valve insufficiency: Secondary | ICD-10-CM

## 2017-02-21 DIAGNOSIS — J90 Pleural effusion, not elsewhere classified: Secondary | ICD-10-CM | POA: Diagnosis not present

## 2017-02-21 DIAGNOSIS — Z66 Do not resuscitate: Secondary | ICD-10-CM | POA: Diagnosis present

## 2017-02-21 DIAGNOSIS — I671 Cerebral aneurysm, nonruptured: Secondary | ICD-10-CM | POA: Diagnosis not present

## 2017-02-21 DIAGNOSIS — R4701 Aphasia: Secondary | ICD-10-CM | POA: Diagnosis present

## 2017-02-21 DIAGNOSIS — I6523 Occlusion and stenosis of bilateral carotid arteries: Secondary | ICD-10-CM | POA: Diagnosis not present

## 2017-02-21 DIAGNOSIS — Z888 Allergy status to other drugs, medicaments and biological substances status: Secondary | ICD-10-CM | POA: Diagnosis not present

## 2017-02-21 DIAGNOSIS — I10 Essential (primary) hypertension: Secondary | ICD-10-CM | POA: Diagnosis not present

## 2017-02-21 DIAGNOSIS — I25810 Atherosclerosis of coronary artery bypass graft(s) without angina pectoris: Secondary | ICD-10-CM | POA: Diagnosis present

## 2017-02-21 DIAGNOSIS — I5042 Chronic combined systolic (congestive) and diastolic (congestive) heart failure: Secondary | ICD-10-CM | POA: Diagnosis not present

## 2017-02-21 DIAGNOSIS — Z7982 Long term (current) use of aspirin: Secondary | ICD-10-CM | POA: Diagnosis not present

## 2017-02-21 DIAGNOSIS — N183 Chronic kidney disease, stage 3 (moderate): Secondary | ICD-10-CM | POA: Diagnosis not present

## 2017-02-21 DIAGNOSIS — Z87891 Personal history of nicotine dependence: Secondary | ICD-10-CM | POA: Diagnosis not present

## 2017-02-21 DIAGNOSIS — I481 Persistent atrial fibrillation: Secondary | ICD-10-CM | POA: Diagnosis present

## 2017-02-21 DIAGNOSIS — I6340 Cerebral infarction due to embolism of unspecified cerebral artery: Secondary | ICD-10-CM

## 2017-02-21 DIAGNOSIS — Z95810 Presence of automatic (implantable) cardiac defibrillator: Secondary | ICD-10-CM | POA: Diagnosis not present

## 2017-02-21 LAB — BRAIN NATRIURETIC PEPTIDE: B NATRIURETIC PEPTIDE 5: 458.2 pg/mL — AB (ref 0.0–100.0)

## 2017-02-21 LAB — URINALYSIS, ROUTINE W REFLEX MICROSCOPIC
BILIRUBIN URINE: NEGATIVE
Bacteria, UA: NONE SEEN
Glucose, UA: NEGATIVE mg/dL
KETONES UR: NEGATIVE mg/dL
Leukocytes, UA: NEGATIVE
Nitrite: NEGATIVE
PROTEIN: NEGATIVE mg/dL
SPECIFIC GRAVITY, URINE: 1.013 (ref 1.005–1.030)
pH: 5 (ref 5.0–8.0)

## 2017-02-21 LAB — TROPONIN I
TROPONIN I: 0.12 ng/mL — AB (ref <0.03)
Troponin I: 0.14 ng/mL (ref <0.03)
Troponin I: 0.13 ng/mL (ref <0.03)

## 2017-02-21 LAB — LIPID PANEL
CHOL/HDL RATIO: 4 ratio
Cholesterol: 123 mg/dL (ref 0–200)
HDL: 31 mg/dL — AB (ref >40)
LDL Cholesterol: 63 mg/dL (ref 0–99)
Triglycerides: 143 mg/dL (ref <150)
VLDL: 29 mg/dL (ref 0–40)

## 2017-02-21 LAB — TSH: TSH: 2.601 u[IU]/mL (ref 0.350–4.500)

## 2017-02-21 LAB — PROTIME-INR
INR: 2.76
PROTHROMBIN TIME: 28.9 s — AB (ref 11.4–15.2)

## 2017-02-21 LAB — MAGNESIUM: Magnesium: 1.8 mg/dL (ref 1.7–2.4)

## 2017-02-21 LAB — ECHOCARDIOGRAM COMPLETE
Height: 70 in
Weight: 3104 oz

## 2017-02-21 LAB — VITAMIN B12: Vitamin B-12: 379 pg/mL (ref 180–914)

## 2017-02-21 LAB — HEMOGLOBIN A1C
Hgb A1c MFr Bld: 6.3 % — ABNORMAL HIGH (ref 4.8–5.6)
Mean Plasma Glucose: 134.11 mg/dL

## 2017-02-21 LAB — PHOSPHORUS: PHOSPHORUS: 2.5 mg/dL (ref 2.5–4.6)

## 2017-02-21 MED ORDER — ATORVASTATIN CALCIUM 80 MG PO TABS: 80.0000 mg | ORAL_TABLET | Freq: Every day | ORAL | Status: DC

## 2017-02-21 MED ORDER — WARFARIN SODIUM 3 MG PO TABS
3.0000 mg | ORAL_TABLET | Freq: Once | ORAL | Status: AC
  Administered 2017-02-21: 3 mg via ORAL
  Filled 2017-02-21: qty 1

## 2017-02-21 MED ORDER — IOPAMIDOL (ISOVUE-370) INJECTION 76%
INTRAVENOUS | Status: AC
  Administered 2017-02-21: 50 mL
  Filled 2017-02-21: qty 50

## 2017-02-21 MED ORDER — SODIUM CHLORIDE 0.9 % IV SOLN
INTRAVENOUS | Status: DC
  Administered 2017-02-21: 11:00:00 via INTRAVENOUS

## 2017-02-21 MED ORDER — SODIUM CHLORIDE 0.9 % IV BOLUS (SEPSIS)
500.0000 mL | Freq: Once | INTRAVENOUS | Status: AC
  Administered 2017-02-21: 500 mL via INTRAVENOUS

## 2017-02-21 MED ORDER — WARFARIN SODIUM 3 MG PO TABS
3.0000 mg | ORAL_TABLET | ORAL | Status: DC
  Filled 2017-02-21: qty 1

## 2017-02-21 MED ORDER — SIMVASTATIN 20 MG PO TABS
20.0000 mg | ORAL_TABLET | Freq: Every day | ORAL | Status: DC
  Administered 2017-02-21 – 2017-02-22 (×2): 20 mg via ORAL
  Filled 2017-02-21 (×2): qty 1

## 2017-02-21 MED ORDER — SODIUM CHLORIDE 0.9 % IV SOLN
INTRAVENOUS | Status: DC
  Administered 2017-02-21 – 2017-02-22 (×2): via INTRAVENOUS

## 2017-02-21 MED ORDER — VITAMIN B-12 1000 MCG PO TABS
1000.0000 ug | ORAL_TABLET | Freq: Every day | ORAL | Status: DC
  Administered 2017-02-21 – 2017-02-23 (×3): 1000 ug via ORAL
  Filled 2017-02-21 (×3): qty 1

## 2017-02-21 NOTE — Progress Notes (Signed)
NEUROHOSPITALISTS STROKE TEAM - DAILY PROGRESS NOTE   ADMISSION HISTORY: Kenneth Bell is a 84 y.o. male who has a PMH of HTN, COPD, HLD, Afib on coumadin s/p ICD placement, CHF, AS, AR, chronic trop elevation who was brought in for evaluation of episodes of forgetfulness and inability to performs tasks of daily living that he is usually completely comfortable performing. Since about December 2018, he has had multiple episodes as an example where he had his TV remote and did not know how to turn it on. Another example, he forgot for a while or could not for a while put his pants on. These episodes spontaneously resolved. He has had episodes of confusion where he is disoriented and then those resolve too. He has also had word finding difficulty episodes, that have resolved. No headaches, visual changes. No preceding illnesses. Denies any memory problems and family also denies any progressive memory or cognitive difficulties  SUBJECTIVE (INTERVAL HISTORY)  Daughter is at the bedside. Patient is found laying in bed in NAD. Overall he feels his condition is unchanged. Voices no new complaints. No new events reported overnight. Daughter states that patient has had several episodes of brief confusion in the last month or so. A couple of occasions he has as he forgot how to visit his trousers. On one occasion he had trouble holding a glass. They denies significant memory loss but patient lives with them and family provides most of his activities of daily living. There is no family history of Alzheimer's   OBJECTIVE Lab Results: CBC:  Recent Labs  Lab 02/20/17 1530 02/20/17 1544  WBC 4.6  --   HGB 12.6* 13.3  HCT 39.8 39.0  MCV 98.0  --   PLT 250  --    BMP: Recent Labs  Lab 02/20/17 1530 02/20/17 1544 02/21/17 1121  NA 137 140  --   K 4.3 4.2  --   CL 101 101  --   CO2 26  --   --   GLUCOSE 106* 104*  --   BUN 41* 39*  --     CREATININE 1.78* 1.70*  --   CALCIUM 10.0  --   --   MG  --   --  1.8  PHOS  --   --  2.5   Liver Function Tests:  Recent Labs  Lab 02/20/17 1530  AST 30  ALT 16*  ALKPHOS 89  BILITOT 1.2  PROT 7.3  ALBUMIN 3.6   Thyroid Function Studies:  Recent Labs    02/21/17 0336  TSH 2.601   Cardiac Enzymes:  Recent Labs  Lab 02/20/17 2310 02/21/17 0336 02/21/17 1121  TROPONINI 0.12* 0.14* 0.13*   Coagulation Studies:  Recent Labs    02/20/17 1530 02/21/17 0336  APTT 44*  --   INR 2.68 2.76   [place] Work -Up:  Recent Labs    02/21/17 0336  VITAMINB12 379   Urinalysis:  Recent Labs  Lab 02/21/17 0103  COLORURINE YELLOW  APPEARANCEUR CLEAR  LABSPEC 1.013  PHURINE 5.0  GLUCOSEU NEGATIVE  HGBUR SMALL*  BILIRUBINUR NEGATIVE  KETONESUR NEGATIVE  PROTEINUR NEGATIVE  NITRITE NEGATIVE  LEUKOCYTESUR NEGATIVE   PHYSICAL EXAM Temp:  [97.4 F (36.3 C)-98.2 F (36.8 C)] 98 F (36.7 C) (01/11 0509) Pulse Rate:  [60-103] 103 (01/11 0509) Resp:  [17-19] 17 (01/11 0509) BP: (121-135)/(72-88) 133/72 (01/11 0509) SpO2:  [96 %-99 %] 98 % (01/11 0509) Weight:  [88 kg (194 lb)] 88 kg (194 lb) (01/10  1522) General - Well nourished, well developed pleasant elderly Caucasian male, in no apparent distress HEENT-  Normocephalic, Normal external eye/conjunctiva.  Normal external ears. Normal external nose, mucus membranes and septum.   Cardiovascular - Regular rate and rhythm  Respiratory - Lungs clear bilaterally. No wheezing. Abdomen - soft and non-tender, BS normal Extremities- no edema or cyanosis Neurological Exam: Alert awake oriented x2. Spatial apraxia and visual agnosia noted. Difficulty with all forms of cognition testing. Diminished attention, registration and recall. Can follow only simple one-step commands with increase reaction time. Unable to copy intersecting pentagons or draw a clock. Unable to name all objects in the picture. Able to name only 5 animals with  for legs. Speech is clear.  Naming repetition fluency intact. Some nonfluent hesitant speech Cranial nerves: Pupils equal round reactive to light, extra ocular movements intact, visual fields full, no facial asymmetry, facial sensation intact, tongue uvula and soft palate midline, sternomastoid and trapezius strength normal, no evidence of tongue atrophy or fibrillations. Motor exam: 5/5 all over with no focal motor deficits or drift. Sensory exam: Intact to light touch all over Coordination intact finger-nose-finger bilaterally Gait exam deferred at this time.  IMAGING: I have personally reviewed the radiological images below and agree with the radiology interpretations.  Ct Head Wo Contrast Result Date: 02/20/2017 IMPRESSION: Atrophy with small vessel chronic ischemic changes of deep cerebral white matter. No acute intracranial abnormalities. 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 Electronically Signed   By: Lavonia Dana M.D.   On: 02/20/2017 17:01   Ct Angio Head and Neck W Or Wo Contrast Result Date: 02/21/2017 IMPRESSION: Widespread atherosclerotic vascular disease affecting all arterial structures. 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 Atherosclerotic calcification at both carotid bifurcation regions. No measurable stenosis. Atherosclerotic calcification in both carotid siphon regions with narrowing estimated at 30-50% on each side. No intracranial flow limiting stenosis or occlusion of the large or medium size vessels. Distal vessel atherosclerotic change diffusely. 14 x 10 x 11 mm aneurysm at the right MCA bifurcation with major branch vessels arising directly from the aneurysm. Stenoses of the distal vertebral arteries and basilar artery estimated at 30-50%. 70% stenosis of the distal left vertebral artery. Distal vessel  atherosclerotic change diffusely. Question nonocclusive thrombus within the left jugular vein. 1.8 cm density in the right upper lobe which is indeterminate. This could be an infiltrate or mass. Complete chest CT suggested when able. Electronically Signed   By: Nelson Chimes M.D.   On: 02/21/2017 11:08    Echocardiogram:                                              PENDING B/L Carotid U/S:                                                PENDING EEG:  PENDING    IMPRESSION: Mr. Kenneth Bell is a 83 y.o. male with PMH of presents to hospital for evaluation of intermittent episodes of confusion over the past 2 weeks, inability to do his daily tasks that he otherwise is very proficient and doing, intermittent episodes of word finding difficulty, concerning for stroke/TIA. CT Head negative for stroke.  Rule out TIA:  Work up for reversible causes of dementia Suspected Etiology: unknown at this time Resultant Symptoms: intermittent episodes of confusion Stroke Risk Factors: atrial fibrillation, hyperlipidemia and hypertension Other Stroke Risk Factors: Advanced age, CHF  Outstanding Stroke Work-up Studies:     Echocardiogram:                                                    PENDING B/L Carotid U/S:                                                     PENDING EEG:                                                                      PENDING CT chest for lung mass  02/21/2017 ASSESSMENT:   Unable to get MRI due to ICD. CTA showing arthero and IC stenosis.  Neuro exam non-focal for stroke. Expressive aphasia, spatial apraxia and visual agnosia noted. Difficulties with simple naming animals, drawing clock, adding numbers, etc.Work up for dementia in progress. Will continue to monitor. No obvious reversible causes of symptoms at this time.  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 May slowly restart home B/P medications Home Meds: Metoprolol restarted 02/21/2017  HYPERLIPIDEMIA:    Component Value Date/Time   CHOL 123 02/21/2017 0336   TRIG 143 02/21/2017 0336   HDL 31 (L) 02/21/2017 0336   CHOLHDL 4.0 02/21/2017 0336   VLDL 29 02/21/2017  0336   [place] 02/21/2017 0336  Home Meds:  Zocor 20 mg LDL  goal < 70 Continued on Zocor 20 mg daily Continue statin at discharge  R/O DIABETES: Lab Results  Component Value Date   HGBA1C 6.3 (H) 02/21/2017  HgbA1c goal < 7.0  Other Active Problems: Principal Problem:   Confusion Active Problems:   Persistent atrial fibrillation (HCC); CHA2DS2Vasc = ~4 (CHF, MI/aortic plaque, Age 31)   Chronic combined systolic and diastolic heart failure,  NYHA class 3 (HCC)   CKD (chronic kidney disease), stage III (HCC)   Elevated troponin   Presence of biventricular implantable cardioverter-defibrillator (ICD)   Hyperlipidemia with target LDL less than 70   Essential hypertension   Coronary artery disease involving native heart with angina pectoris (HCC)   COPD (chronic obstructive pulmonary disease) ([place])   Brain aneurysm   Cerebral thrombosis with cerebral infarction   Cerebral embolism with cerebral infarction   Subarachnoid hemorrhage    Hospital day # 0 VTE prophylaxis: Coumadin Diet : Diet Heart Room service appropriate? Yes; Fluid consistency: Thin   FAMILY UPDATES: family at bedside  TEAM UPDATES: Modena Jansky, MD   Prior Home Stroke Medications:  aspirin 81 mg daily and warfarin daily  Discharge Stroke Meds:  Please discharge patient on aspirin 81 mg daily and warfarin daily   Disposition: 01-Home or Self Care Therapy Recs:               PENDING Home Equipment:         PENDING Follow Up:  Follow-up Information    Garvin Fila, MD. Schedule an appointment as soon as possible for a visit in 6 week(s).   Specialties:  Neurology, Radiology Contact information: [address] [place] [address] [place], [place]ier Field, DO -PCP Follow up in 1-2 weeks      Assessment & plan discussed with with attending physician and they are in agreement.    Mary Sella, ANP-C Stroke Neurology Team 02/21/2017 1:31 PM I have personally examined this patient, reviewed notes, independently viewed imaging studies, participated in medical decision making and plan of care.ROS completed by me personally and pertinent positives fully documented  I have made any additions or clarifications directly to the above note. Agree with note above. The patient has presented with one-month history of episodic confusion and apraxia but on cognitive testing shows profound impairment likely has underlying  dementia. Reversible causes need to be ruled out. Check CT scan of the chest given abnormal findings to rule out malignancy. Long discussion with the patient and daughter regarding plan for evaluation and answered questions. Neuro hospitalist team will take over further neurological workup for this patient. Discussed with Dr. Roland Rack. Greater than 50% time during this 35 minute visit was spent on counseling and coordination of care about his episodes of confusion, apraxia and differential diagnosis of dementia discussion and answering questions  Antony Contras, MD Medical Director [facility] Pager: [phone] 02/21/2017 2:43 PM  To contact Stroke Continuity provider, please refer to [email or web address] After hours, contact General Neurology

## 2017-02-21 NOTE — Evaluation (Addendum)
Occupational Therapy Evaluation Patient Details Name: Janis Cuffe MRN: [id number] DOB: [birth year] Today's Date: 02/21/2017    History of Present Illness 84 y.o. male with medical history significant of hypertension, hyperlipidemia, COPD, GERD, hypothyroidism, ICD placement, atrial fibrillation on Coumadin, CAD, CABG, stent placement, CHF with EF of 40%, brain aneurysm, aortic stenosis, aortic insufficiency, chronically elevated troponin (0.11-0.13), who presents with confusion, forgetfulness and difficulty speaking. CT head is negative for acute intracranial abnormalities.    Clinical Impression   Pt admitted with the above diagnoses and presents with below problem list. Pt will benefit from continued acute OT to address the below listed deficits and maximize independence with ADLs prior to d/c to next venue. Unclear PLOF as no family present and pt is questionable historian. Pt reports he lives with his son and daughter-in-law both of whom work during the day. Pt present with cognitive and balance deficits making him min to mod A with most ADLs. Recommending SNF at d/c for rehab. Will continue to follow and update d/c plan if indicated.      Follow Up Recommendations  SNF;Supervision/Assistance - 24 hour    Equipment Recommendations  None recommended by OT    Recommendations for Other Services PT consult     Precautions / Restrictions Precautions Precautions: Fall Restrictions Weight Bearing Restrictions: No      Mobility Bed Mobility Overal bed mobility: Needs Assistance Bed Mobility: Supine to Sit;Sit to Supine     Supine to sit: Min guard Sit to supine: Min guard      Transfers Overall transfer level: Needs assistance Equipment used: None Transfers: Sit to/from Stand Sit to Stand: Min guard;Min assist         General transfer comment: mostly min guard some light steadying assist. from EOB and recliner    Balance Overall balance assessment: Needs assistance          Standing balance support: No upper extremity supported;During functional activity Standing balance-Leahy Scale: Poor Standing balance comment: fair static, poor dynamic (seeks external support).                           ADL either performed or assessed with clinical judgement   ADL Overall ADL's : Needs assistance/impaired Eating/Feeding: Set up;Sitting   Grooming: Standing;Min guard;Minimal assistance;Wash/dry face Grooming Details (indicate cue type and reason): assist for sequencing task and location of items around him Upper Body Bathing: Moderate assistance;Sitting   Lower Body Bathing: Moderate assistance   Upper Body Dressing : Moderate assistance   Lower Body Dressing: Moderate assistance   Toilet Transfer: Minimal assistance Toilet Transfer Details (indicate cue type and reason): steadying assist. 3n1 over toilet Toileting- Clothing Manipulation and Hygiene: Moderate assistance   Tub/ Shower Transfer: Moderate assistance   Functional mobility during ADLs: Minimal assistance;Min guard General ADL Comments: Pt declining rw, states he does not use at home. Noted to seek external support walking to/from bathroom. Difficulty sequencing tasks. Needed one task presented at a time.      Vision         Perception     Praxis      Pertinent Vitals/Pain Pain Assessment: Faces Faces Pain Scale: Hurts a little bit     Hand Dominance Right   Extremity/Trunk Assessment Upper Extremity Assessment Upper Extremity Assessment: Overall WFL for tasks assessed;Generalized weakness   Lower Extremity Assessment Lower Extremity Assessment: Defer to PT evaluation       Communication Communication Communication: No difficulties  Cognition Arousal/Alertness: Awake/alert Behavior During Therapy: WFL for tasks assessed/performed(tearful a couple of times) Overall Cognitive Status: Impaired/Different from baseline Area of Impairment:  Orientation;Attention;Memory;Following commands;Safety/judgement;Awareness;Problem solving Tangental responses. Getting tangled up in IV line.                  Orientation Level: Situation Current Attention Level: Sustained Memory: Decreased recall of precautions;Decreased short-term memory Following Commands: Follows one step commands with increased time Safety/Judgement: Decreased awareness of safety;Decreased awareness of deficits Awareness: Emergent Problem Solving: Slow processing;Difficulty sequencing General Comments: No family present to compare cognition to baseline. Pt reports he feels he's thinking is off.     General Comments  No family present during eval    Exercises     Shoulder Instructions      Home Living Family/patient expects to be discharged to:: Private residence Living Arrangements: Children;Other (Comment)(son and daughter-in-law) Available Help at Discharge: Family;Available PRN/intermittently Type of Home: House Home Access: Level entry     Home Layout: One level         Bathroom Toilet: Handicapped height     Home Equipment: Walker - 4 wheels;Cane - single point;Shower seat - built in   Additional Comments: Son and daughter in Sports coach both work. Pt reports visiting spouse in nursing home (dementia) regularly.       Prior Functioning/Environment Level of Independence: Independent        Comments: Pt reports he does not use cane or rw at baseline. No family present to confirm this.         OT Problem List: Decreased activity tolerance;Impaired balance (sitting and/or standing);Decreased cognition;Decreased knowledge of use of DME or AE;Decreased knowledge of precautions;Cardiopulmonary status limiting activity      OT Treatment/Interventions: Self-care/ADL training;Energy conservation;DME and/or AE instruction;Therapeutic activities;Cognitive remediation/compensation;Patient/family education;Balance training    OT Goals(Current goals  can be found in the care plan section) Acute Rehab OT Goals Patient Stated Goal: did not state OT Goal Formulation: With patient Time For Goal Achievement: 03/07/17 Potential to Achieve Goals: Good ADL Goals Pt Will Perform Grooming: with modified independence;sitting;standing Pt Will Perform Upper Body Dressing: with set-up;sitting Pt Will Perform Lower Body Dressing: with supervision;sit to/from stand;with set-up Pt Will Transfer to Toilet: with supervision;ambulating Pt Will Perform Toileting - Clothing Manipulation and hygiene: with set-up;with supervision;sit to/from stand;sitting/lateral leans Pt Will Perform Tub/Shower Transfer: with supervision;ambulating  OT Frequency: Min 2X/week   Barriers to D/C: Decreased caregiver support  alone during the day       Co-evaluation              AM-PAC PT "6 Clicks" Daily Activity     Outcome Measure Help from another person eating meals?: None Help from another person taking care of personal grooming?: A Lot Help from another person toileting, which includes using toliet, bedpan, or urinal?: A Little Help from another person bathing (including washing, rinsing, drying)?: A Lot Help from another person to put on and taking off regular upper body clothing?: A Lot Help from another person to put on and taking off regular lower body clothing?: A Lot 6 Click Score: 15   End of Session Equipment Utilized During Treatment: Gait belt  Activity Tolerance: Other (comment)(DOE at end of session) Patient left: in bed;with call bell/phone within reach;with bed alarm set  OT Visit Diagnosis: Unsteadiness on feet (R26.81);Other symptoms and signs involving cognitive function                Time: 5956-3875 OT Time Calculation (min):  35 min Charges:  OT General Charges $OT Visit: 1 Visit OT Evaluation $OT Eval Low Complexity: 1 Low OT Treatments $Self Care/Home Management : 8-22 mins G-Codes:       Hortencia Pilar 02/21/2017, 1:41  PM

## 2017-02-21 NOTE — Progress Notes (Signed)
ANTICOAGULATION CONSULT NOTE - Initial Consult  Pharmacy Consult for warfarin Indication: atrial fibrillation  Allergies  Allergen Reactions  . Nitroglycerin Other (See Comments)    DROPS BLOOD PRESSURE DRASTICALLY Other reaction(s): Other (See Comments) BP drops   Patient Measurements: Height: 5\' 10"  (177.8 cm) Weight: 194 lb (88 kg) IBW/kg (Calculated) : 73  Vital Signs: Temp: 98 F (36.7 C) (01/11 0509) Temp Source: Oral (01/11 0509) BP: 133/72 (01/11 0509) Pulse Rate: 103 (01/11 0509)  Labs: Recent Labs    02/20/17 1530 02/20/17 1544 02/20/17 2310 02/21/17 0336  HGB 12.6* 13.3  --   --   HCT 39.8 39.0  --   --   PLT 250  --   --   --   APTT 44*  --   --   --   LABPROT 28.3*  --   --  28.9*  INR 2.68  --   --  2.76  CREATININE 1.78* 1.70*  --   --   TROPONINI  --   --  0.12* 0.14*    Estimated Creatinine Clearance: 33.6 mL/min (A) (by C-G formula based on SCr of 1.7 mg/dL (H)).   Medical History: Past Medical History:  Diagnosis Date  . Brain aneurysm   . Chronic combined systolic and diastolic heart failure, NYHA class 3 (HCC)    EF has seemed to vary report-to-report. Myoview 2007 showed EF 30%, echo in 2016 7 EF 60-65% -> Echo in 08/2015 & 08/2016  40-45%  (with basal-mid inferoseptal & apical inferolateral wall), Mod P HTN (PAP ~53 mHg)  . CKD (chronic kidney disease), stage III ([place]) 08/26/2016  . COPD (chronic obstructive pulmonary disease) ([place])   . Coronary artery disease involving native heart with angina pectoris ([place])    a. MI 2002 - PCI to Zeb. b. 2005: PCI to LAD, OM1 & OM2.  c. s/p CABG in 2013: LIMA to LAD, SVG to OM2, SVG to PDA and SVG to RI. ; d. Cath 2017: occluded LAD, OM1 & OM2, SVG-RI with patent LIMA-LAD, SVG-rPDA & SVG-OM2.   . Essential hypertension   . Hyperlipidemia with target LDL less than 70   . Mild aortic insufficiency 08/28/2016   noted on Echo  . Moderate pulmonic regurgitation and right ventricular dilation by prior  echocardiogram 08/28/2016   Noted on Echo - PAP ~ 53 mHg  . Persistent atrial fibrillation ([place])   . Presence of biventricular implantable cardioverter-defibrillator (ICD) 2008  . Thyroid disease    Assessment:Kenneth Bell is a 84 y.o. male on warfarin PTA for atrial fibrillation. INR 2.68 in the ED. HgB 12.6, PLT; Spoke with provider in ED and he wants to continue warfarin tonight. CT negative for bleed; no anticipated procedures.  PTA warfarin dose: 3mg  on Mondays and Fridays, 6mg  on all other days  INR therapeutic at 2.76. CBC WNL and no signs/symptoms of bleeding.  Goal of Therapy:  INR 2-3 Monitor platelets by anticoagulation protocol: Yes   Plan:  Warfarin 3mg  x1 tonight 1/11 Monitor INR and CBC daily Monitor for s/sx of bleeding  Thank you for allowing [place] to participate in this patients care.  Mariella Saa, PharmD Clinical Pharmacist  02/21/2017 11:42 AM

## 2017-02-21 NOTE — Progress Notes (Signed)
PT Cancellation Note  Patient Details Name: Thong Feeny MRN: [id number] DOB: 01/02/1927   Cancelled Treatment:    Reason Eval/Treat Not Completed: (P) Patient at procedure or test/unavailable PT attempted to perform Evaluation multiple times today and has been out of room. Pt currently having EEG completed. PT will follow back for evaluation tomorrow.  Lezli Danek B. Migdalia Dk PT, DPT Acute Rehabilitation  [phone] Pager [phone]  [place] 02/21/2017, 3:36 PM

## 2017-02-21 NOTE — Progress Notes (Signed)
SLP Cancellation Note  Patient Details Name: Kenneth Bell MRN: [id number] DOB: [birth year]   Cancelled treatment:       Reason Eval/Treat Not Completed: Patient at procedure or test/unavailable, pt undergoing EEG upon therapist's arrival.  Per tech, needs to be in relaxed state with eyes closed for 25 min while test runs.  Will follow up at next available appointment.    Caylor Cerino, Selinda Orion 02/21/2017, 3:28 PM

## 2017-02-21 NOTE — Progress Notes (Addendum)
received report

## 2017-02-21 NOTE — Progress Notes (Signed)
  Echocardiogram 2D Echocardiogram has been performed.  Kenneth Bell 02/21/2017, 1:47 PM

## 2017-02-21 NOTE — Procedures (Signed)
History: 84 year old male with episodic confusion  Sedation: None  Technique: This is a 21 channel routine scalp EEG performed at the bedside with bipolar and monopolar montages arranged in accordance to the international 10/20 system of electrode placement. One channel was dedicated to EKG recording.    Background: The background consists of intermixed alpha and beta activities. There is a well defined posterior dominant rhythm of 9 hz that attenuates with eye opening. Sleep is recorded with normal appearing structures.   Photic stimulation: Physiologic driving is not performed  EEG Abnormalities: None  Clinical Interpretation: This normal EEG is recorded in the waking and sleep state. There was no seizure or seizure predisposition recorded on this study. Please note that a normal EEG does not preclude the possibility of epilepsy.   Roland Rack, MD [facility] [phone]  If 7pm- 7am, please page neurology on call as listed in [place].

## 2017-02-21 NOTE — Progress Notes (Signed)
Bedside EEG completed, results pending. 

## 2017-02-21 NOTE — Progress Notes (Signed)
PROGRESS NOTE   Kenneth Bell  CWC:[id number]    DOB: [birth year]    DOA: 02/20/2017  PCP: [name], DO   I have briefly reviewed patients previous medical records in [facility].  Brief Narrative:  84 year old male, lives with his daughter and son-in-law, independent of activities, PMH of HTN, HLD, COPD, GERD, hypothyroid, ICD placement, atrial fibrillation on Coumadin, CAD, CABG, chronic systolic CHF, brain aneurysm, aortic stenosis, aortic insufficiency, chronically elevated troponin, presented to ED with confusion, forgetfulness and difficulty speaking. As per daughter, 2 weeks history of episodic confusion and apraxia. Neurology consulted.   Assessment & Plan:   Principal Problem:   Confusion Active Problems:   Persistent atrial fibrillation (HCC); CHA2DS2Vasc = ~4 (CHF, MI/aortic plaque, Age 89)   Chronic combined systolic and diastolic heart failure, NYHA class 3 (HCC)   CKD (chronic kidney disease), stage III (HCC)   Elevated troponin   Presence of biventricular implantable cardioverter-defibrillator (ICD)   Hyperlipidemia with target LDL less than 70   Essential hypertension   Coronary artery disease involving native heart with angina pectoris (HCC)   COPD (chronic obstructive pulmonary disease) (HCC)   Brain aneurysm   Cerebral thrombosis with cerebral infarction   Cerebral embolism with cerebral infarction   Subarachnoid hemorrhage   1. Confusion and apraxia: Neurology consultation appreciated. Discussed with Dr. Leonie Man. Suspects underlying dementia and rule out reversible causes. CT head 1/10: No acute intracranial abnormalities. Unable to do MRI because of ICD. CTA head and neck 1/11: Widespread atherosclerotic vascular disease. 14 x 10 x 10 mm aneurysm at right MCA bifurcation. TSH normal. A1c 6.3. B12 379. On aspirin. 2. Persistent atrial fibrillation: Paced rhythm on telemetry. Anticoagulated on Coumadin. Continue metoprolol. 3. Stage III chronic  kidney disease: Creatinine at baseline. Follow BMP post contrasted CT. IV fluids. 4. CAD, CABG, chronic troponin elevation: No chest pain reported. Continue aspirin, statins and beta blockers. 5. Hyperlipidemia: Statins. 6. Essential hypertension: Reasonable control. Continue metoprolol. 7. COPD: Stable without bronchospasm. 8. Right MCA aneurysm: Noted. 9. Chronic systolic and diastolic CHF: 2-D echo LVEF 45-50 percent. Clinically compensated. Temporarily hold Bumex. Brief IV fluids due to contrast. 10. RUL density: Noted on CTA head and neck. CT chest to assess.   DVT prophylaxis: Anticoagulated on Coumadin. Code Status: DO NOT RESUSCITATE. Family Communication: Discussed in detail with patient's daughter at bedside. Disposition: SNF when medically improved.   Consultants:  Neurology   Procedures:  As above  Antimicrobials:  None    Subjective: Alert and oriented 2 and partly to time. Ongoing confusion, sees clock on the wall but unable to read time. As per daughter, 2 weeks history of episodes of abnormal behavior/confusion: Suddenly forgets how to wear his clothes, unable to write checks, cannot read time on his wrist watch. Seen in ED a week ago with negative workup, followed up with PCP, progressively worsened in the last couple days and hence to ED.  ROS: As above.  Objective:  Vitals:   02/20/17 2314 02/21/17 0037 02/21/17 0509 02/21/17 1700  BP: 121/88 134/73 133/72 (!) 163/85  Pulse: 66 60 (!) 103 (!) 59  Resp: 19 18 17 20   Temp:  97.7 F (36.5 C) 98 F (36.7 C) 97.6 F (36.4 C)  TempSrc:  Oral Oral Oral  SpO2: 99% 96% 98% 97%  Weight:      Height:        Examination:  General exam: Elderly male, moderately built and nourished, seen ambulating with assistance/supervision. Respiratory system: Clear  to auscultation. Respiratory effort normal. Cardiovascular system: S1 & S2 heard, RRR. No JVD, murmurs, rubs, gallops or clicks. No pedal edema. Telemetry: V  paced rhythm. Gastrointestinal system: Abdomen is nondistended, soft and nontender. No organomegaly or masses felt. Normal bowel sounds heard. Central nervous system: Alert and oriented to person, place and partly to time. Apraxia +. No focal neurological deficits. Extremities: Symmetric 5 x 5 power. Skin: No rashes, lesions or ulcers Psychiatry: Judgement and insight impaired. Mood & affect appropriate.     Data Reviewed: I have personally reviewed following labs and imaging studies  CBC: Recent Labs  Lab 02/20/17 1530 02/20/17 1544  WBC 4.6  --   NEUTROABS 2.9  --   HGB 12.6* 13.3  HCT 39.8 39.0  MCV 98.0  --   PLT 250  --    Basic Metabolic Panel: Recent Labs  Lab 02/20/17 1530 02/20/17 1544 02/21/17 1121  NA 137 140  --   K 4.3 4.2  --   CL 101 101  --   CO2 26  --   --   GLUCOSE 106* 104*  --   BUN 41* 39*  --   CREATININE 1.78* 1.70*  --   CALCIUM 10.0  --   --   MG  --   --  1.8  PHOS  --   --  2.5   Liver Function Tests: Recent Labs  Lab 02/20/17 1530  AST 30  ALT 16*  ALKPHOS 89  BILITOT 1.2  PROT 7.3  ALBUMIN 3.6   Coagulation Profile: Recent Labs  Lab 02/20/17 1530 02/21/17 0336  INR 2.68 2.76   Cardiac Enzymes: Recent Labs  Lab 02/20/17 2310 02/21/17 0336 02/21/17 1121  TROPONINI 0.12* 0.14* 0.13*   HbA1C: Recent Labs    02/21/17 0336  HGBA1C 6.3*   CBG: No results for input(s): GLUCAP in the last 168 hours.  No results found for this or any previous visit (from the past 240 hour(s)).       Radiology Studies: Ct Angio Head W Or Wo Contrast  Result Date: 02/21/2017 CLINICAL DATA:  Right-sided weakness, confusion and aphasia. History brain aneurysm. EXAM: CT ANGIOGRAPHY HEAD AND NECK TECHNIQUE: Multidetector CT imaging of the head and neck was performed using the standard protocol during bolus administration of intravenous contrast. Multiplanar CT image reconstructions and MIPs were obtained to evaluate the vascular  anatomy. Carotid stenosis measurements (when applicable) are obtained utilizing NASCET criteria, using the distal internal carotid diameter as the denominator. CONTRAST:  42mL ISOVUE-370 IOPAMIDOL (ISOVUE-370) INJECTION 76% COMPARISON:  CT 02/11/2017 and 02/20/2017 FINDINGS: Low to moderate arterial contrast opacity. CTA NECK FINDINGS Aortic arch: Atherosclerosis of the arch. No aneurysm or dissection. 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 Right carotid system: Common carotid artery shows atherosclerotic plaque but is patent to the bifurcation region. Extensive calcified plaque at the carotid bifurcation and proximal ICA. Minimal diameter is 4.5 mm. Compared to a more distal cervical ICA diameter of the same, there is no stenosis. Left carotid system: Common carotid artery shows plaque but no stenosis. At the bifurcation, there is extensive calcified plaque affecting the ICA bulb. Minimal diameter at the distal bulb is 4.5 mm, this same as the more distal cervical ICA, therefore no stenosis. Vertebral arteries: Vertebral artery origins are poorly seen due to low contrast opacity. Both vertebral arteries do show flow and are approximately equal  in size. No stenosis seen in the cervical region. Skeleton: Ordinary cervical spondylosis. Other neck: Venous reflux in the left jugular vein raises the possibility nonocclusive thrombus. This is not definite. Findings could be flow related. Multiple bilateral thyroid nodules. Upper chest: Abnormal density in the central right upper lobe measuring up to 1.8 cm. This could represent infiltrate or a mass. Recommend complete chest CT when able. Review of the MIP images confirms the above findings CTA HEAD FINDINGS Anterior circulation: Both internal carotid arteries are patent through the skull base and siphon regions. There is typical peripheral  atherosclerotic calcification throughout the carotid siphon regions without stenosis greater than 50%. The anterior and middle cerebral vessels show atherosclerotic calcification. No flow limiting stenosis or vessel occlusion is identified. Peripherally calcified right MCA bifurcation aneurysm measuring 14 x 10 x 11 mm. Main branches arise directly from the body of the aneurysm. Posterior circulation: Atherosclerotic disease of both vertebral arteries at the foramen magnum level with narrowing of 30-50%. Calcified plaque affecting the distal left vertebral artery with stenosis of 70%. Atherosclerotic disease affecting the basilar artery. Proximal basilar fenestration. Areas of stenosis in the proximal basilar artery estimated at 30-50%. Superior cerebellar and posterior cerebral arteries show flow. Fetal origin of the left PCA. Venous sinuses: Patent and normal. Anatomic variants: None other significant. Delayed phase: No abnormal enhancement. Review of the MIP images confirms the above findings IMPRESSION: Widespread atherosclerotic vascular disease affecting all arterial structures. 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 Atherosclerotic calcification at both carotid bifurcation regions. No measurable stenosis. Atherosclerotic calcification in both carotid siphon regions with narrowing estimated at 30-50% on each side. No intracranial flow limiting stenosis or occlusion of the large or medium size vessels. Distal vessel atherosclerotic change diffusely. 14 x 10 x 11 mm aneurysm at the right MCA bifurcation with major branch vessels arising directly from the aneurysm. Stenoses of the distal vertebral arteries and basilar artery estimated at 30-50%. 70% stenosis of the distal left vertebral artery. Distal vessel atherosclerotic change diffusely. Question nonocclusive thrombus within the left jugular vein. 1.8 cm density in the right upper lobe which  is indeterminate. This could be an infiltrate or mass. Complete chest CT suggested when able. Electronically Signed   By: Nelson Chimes M.D.   On: 02/21/2017 11:08   Ct Head Wo Contrast  Result Date: 02/20/2017 CLINICAL DATA:  Subacute neuro deficit, 3 episodes of confusion in the past week, able to verbalize when he needs to do but unable to perform actions, post unable to turn on television, dress himself or balance Hist check buttock EXAM: CT HEAD WITHOUT CONTRAST TECHNIQUE: Contiguous axial images were obtained from the base of the skull through the vertex without intravenous contrast. Sagittal and coronal MPR images reconstructed from axial data set. COMPARISON:  02/11/2017 FINDINGS: Brain: Asymmetric positioning in gantry. Generalized atrophy. Normal ventricular morphology. No midline shift or mass effect. Small vessel chronic ischemic changes of deep cerebral white matter. No intracranial hemorrhage, mass lesion or evidence of acute infarction. No extra-axial fluid collections. Vascular: Aneurysmal dilatation of the RIGHT middle cerebral artery 9.4 mm diameter. Extensive atherosclerotic calcifications of internal carotid, middle cerebral, and vertebral arteries at skull base. Skull: Demineralized but intact Sinuses/Orbits: Question mucosal retention cysts in maxillary sinuses. Mobile opacification within the sphenoid sinus question mucous. Mastoid air cells clear. Other: N/A IMPRESSION: Atrophy with small vessel chronic ischemic changes of deep cerebral white matter. No acute intracranial abnormalities. Extensive  cerebrovascular atherosclerotic calcifications with aneurysmal dilatation of the RIGHT middle cerebral artery 9.4 cm diameter. Electronically Signed   By: Lavonia Dana M.D.   On: 02/20/2017 17:01   Ct Angio Neck W Or Wo Contrast  Result Date: 02/21/2017 CLINICAL DATA:  Right-sided weakness, confusion and aphasia. History brain aneurysm. EXAM: CT ANGIOGRAPHY HEAD AND NECK TECHNIQUE:  Multidetector CT imaging of the head and neck was performed using the standard protocol during bolus administration of intravenous contrast. Multiplanar CT image reconstructions and MIPs were obtained to evaluate the vascular anatomy. Carotid stenosis measurements (when applicable) are obtained utilizing NASCET criteria, using the distal internal carotid diameter as the denominator. CONTRAST:  68mL ISOVUE-370 IOPAMIDOL (ISOVUE-370) INJECTION 76% COMPARISON:  CT 02/11/2017 and 02/20/2017 FINDINGS: Low to moderate arterial contrast opacity. CTA NECK FINDINGS Aortic arch: Atherosclerosis of the arch. No aneurysm or dissection. 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 Right carotid system: Common carotid artery shows atherosclerotic plaque but is patent to the bifurcation region. Extensive calcified plaque at the carotid bifurcation and proximal ICA. Minimal diameter is 4.5 mm. Compared to a more distal cervical ICA diameter of the same, there is no stenosis. Left carotid system: Common carotid artery shows plaque but no stenosis. At the bifurcation, there is extensive calcified plaque affecting the ICA bulb. Minimal diameter at the distal bulb is 4.5 mm, this same as the more distal cervical ICA, therefore no stenosis. Vertebral arteries: Vertebral artery origins are poorly seen due to low contrast opacity. Both vertebral arteries do show flow and are approximately equal in size. No stenosis seen in the cervical region. Skeleton: Ordinary cervical spondylosis. Other neck: Venous reflux in the left jugular vein raises the possibility nonocclusive thrombus. This is not definite. Findings could be flow related. Multiple bilateral thyroid nodules. Upper chest: Abnormal density in the central right upper lobe measuring up to 1.8 cm. This could represent infiltrate or a mass. Recommend complete chest  CT when able. Review of the MIP images confirms the above findings CTA HEAD FINDINGS Anterior circulation: Both internal carotid arteries are patent through the skull base and siphon regions. There is typical peripheral atherosclerotic calcification throughout the carotid siphon regions without stenosis greater than 50%. The anterior and middle cerebral vessels show atherosclerotic calcification. No flow limiting stenosis or vessel occlusion is identified. Peripherally calcified right MCA bifurcation aneurysm measuring 14 x 10 x 11 mm. Main branches arise directly from the body of the aneurysm. Posterior circulation: Atherosclerotic disease of both vertebral arteries at the foramen magnum level with narrowing of 30-50%. Calcified plaque affecting the distal left vertebral artery with stenosis of 70%. Atherosclerotic disease affecting the basilar artery. Proximal basilar fenestration. Areas of stenosis in the proximal basilar artery estimated at 30-50%. Superior cerebellar and posterior cerebral arteries show flow. Fetal origin of the left PCA. Venous sinuses: Patent and normal. Anatomic variants: None other significant. Delayed phase: No abnormal enhancement. Review of the MIP images confirms the above findings IMPRESSION: Widespread atherosclerotic vascular disease affecting all arterial structures. 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 Atherosclerotic calcification at both carotid bifurcation regions. No measurable stenosis. Atherosclerotic calcification in both carotid siphon regions with narrowing estimated at 30-50% on each side. No intracranial flow limiting stenosis or occlusion of the large or medium size vessels. Distal vessel atherosclerotic change diffusely. 14 x 10 x 11 mm  aneurysm at the right MCA bifurcation with major branch vessels arising directly from the aneurysm. Stenoses of the distal vertebral arteries and basilar artery  estimated at 30-50%. 70% stenosis of the distal left vertebral artery. Distal vessel atherosclerotic change diffusely. Question nonocclusive thrombus within the left jugular vein. 1.8 cm density in the right upper lobe which is indeterminate. This could be an infiltrate or mass. Complete chest CT suggested when able. Electronically Signed   By: Nelson Chimes M.D.   On: 02/21/2017 11:08        Scheduled Meds: . aspirin EC  81 mg Oral Daily  . bumetanide  2 mg Oral Daily  . levothyroxine  50 mcg Oral QAC breakfast  . metoprolol succinate  25 mg Oral Daily  . pantoprazole  40 mg Oral Daily  . potassium chloride  10 mEq Oral Daily  . simvastatin  20 mg Oral q1800  . vitamin B-12  1,000 mcg Oral Daily  . warfarin  3 mg Oral ONCE-1800  . Warfarin - Pharmacist Dosing Inpatient   Does not apply q1800   Continuous Infusions: . sodium chloride 75 mL/hr at 02/21/17 1057     LOS: 0 days     Vernell Leep, MD, [place], [facility]. [facility] Pager [phone]  If 7PM-7AM, please contact night-coverage [email or web address] Password [facility] 02/21/2017, 5:48 PM

## 2017-02-21 NOTE — Progress Notes (Signed)
CRITICAL VALUE ALERT  Critical Value:  Troponin  Lab notified RN that patient current Troponin level is 0.12  RN notified provider on call

## 2017-02-22 DIAGNOSIS — R410 Disorientation, unspecified: Secondary | ICD-10-CM

## 2017-02-22 LAB — BASIC METABOLIC PANEL
Anion gap: 8 (ref 5–15)
BUN: 40 mg/dL — AB (ref 6–20)
CO2: 24 mmol/L (ref 22–32)
CREATININE: 1.93 mg/dL — AB (ref 0.61–1.24)
Calcium: 9.5 mg/dL (ref 8.9–10.3)
Chloride: 105 mmol/L (ref 101–111)
GFR calc Af Amer: 34 mL/min — ABNORMAL LOW (ref >60)
GFR, EST NON AFRICAN AMERICAN: 29 mL/min — AB (ref >60)
GLUCOSE: 122 mg/dL — AB (ref 65–99)
POTASSIUM: 3.8 mmol/L (ref 3.5–5.1)
Sodium: 137 mmol/L (ref 135–145)

## 2017-02-22 LAB — RPR: RPR: NONREACTIVE

## 2017-02-22 LAB — PROTIME-INR
INR: 2.83
PROTHROMBIN TIME: 29.5 s — AB (ref 11.4–15.2)

## 2017-02-22 MED ORDER — LACOSAMIDE 50 MG PO TABS
100.0000 mg | ORAL_TABLET | Freq: Two times a day (BID) | ORAL | Status: DC
  Administered 2017-02-22 – 2017-02-23 (×2): 100 mg via ORAL
  Filled 2017-02-22 (×2): qty 2

## 2017-02-22 MED ORDER — TRAZODONE HCL 50 MG PO TABS
50.0000 mg | ORAL_TABLET | Freq: Once | ORAL | Status: AC
  Administered 2017-02-22: 50 mg via ORAL
  Filled 2017-02-22: qty 1

## 2017-02-22 MED ORDER — BUMETANIDE 2 MG PO TABS
2.0000 mg | ORAL_TABLET | Freq: Every day | ORAL | Status: DC
  Filled 2017-02-22: qty 1

## 2017-02-22 MED ORDER — SODIUM CHLORIDE 0.9 % IV SOLN
INTRAVENOUS | Status: AC
  Administered 2017-02-22: 08:00:00 via INTRAVENOUS

## 2017-02-22 MED ORDER — WARFARIN SODIUM 6 MG PO TABS
6.0000 mg | ORAL_TABLET | Freq: Once | ORAL | Status: AC
  Administered 2017-02-22: 6 mg via ORAL
  Filled 2017-02-22: qty 1

## 2017-02-22 NOTE — Progress Notes (Signed)
ANTICOAGULATION CONSULT NOTE -Follow up Pharmacy Consult for warfarin Indication: atrial fibrillation  Allergies  Allergen Reactions  . Nitroglycerin Other (See Comments)    DROPS BLOOD PRESSURE DRASTICALLY Other reaction(s): Other (See Comments) BP drops   Patient Measurements: Height: 5\' 10"  (177.8 cm) Weight: 194 lb (88 kg) IBW/kg (Calculated) : 73  Vital Signs: Temp: 97.7 F (36.5 C) (01/12 1332) Temp Source: Oral (01/12 1332) BP: 123/72 (01/12 1332) Pulse Rate: 59 (01/12 1332)  Labs: Recent Labs    02/20/17 1530 02/20/17 1544 02/20/17 2310 02/21/17 0336 02/21/17 1121 02/22/17 0248  HGB 12.6* 13.3  --   --   --   --   HCT 39.8 39.0  --   --   --   --   PLT 250  --   --   --   --   --   APTT 44*  --   --   --   --   --   LABPROT 28.3*  --   --  28.9*  --  29.5*  INR 2.68  --   --  2.76  --  2.83  CREATININE 1.78* 1.70*  --   --   --  1.93*  TROPONINI  --   --  0.12* 0.14* 0.13*  --     Estimated Creatinine Clearance: 29.6 mL/min (A) (by C-G formula based on SCr of 1.93 mg/dL (H)).   Medical History: Past Medical History:  Diagnosis Date  . Brain aneurysm   . Chronic combined systolic and diastolic heart failure, NYHA class 3 (HCC)    EF has seemed to vary report-to-report. Myoview 2007 showed EF 30%, echo in 2016 7 EF 60-65% -> Echo in 08/2015 & 08/2016  40-45%  (with basal-mid inferoseptal & apical inferolateral wall), Mod P HTN (PAP ~53 mHg)  . CKD (chronic kidney disease), stage III ([place]) 08/26/2016  . COPD (chronic obstructive pulmonary disease) ([place])   . Coronary artery disease involving native heart with angina pectoris ([place])    a. MI 2002 - PCI to [place]. b. 2005: PCI to LAD, OM1 & OM2.  c. s/p CABG in 2013: LIMA to LAD, SVG to OM2, SVG to PDA and SVG to RI. ; d. Cath 2017: occluded LAD, OM1 & OM2, SVG-RI with patent LIMA-LAD, SVG-rPDA & SVG-OM2.   . Essential hypertension   . Hyperlipidemia with target LDL less than 70   . Mild aortic insufficiency  08/28/2016   noted on Echo  . 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 Persistent atrial fibrillation ([place])   . Presence of biventricular implantable cardioverter-defibrillator (ICD) 2008  . Thyroid disease    Assessment:Kenneth Bell is a 84 y.o. male on warfarin PTA for atrial fibrillation. Admitted on 02/20/17 for intermittent episodes of confusion.  Admit  INR 2.68 in the ED. HgB 13.36, PLTC wnl.  CT negative for bleed; no anticipated procedures.  PTA warfarin dose: 3mg  on Mondays and Fridays, 6mg  on all other days (INR 2.68 on admit 1/10) last taken pta on 1/9  INR therapeutic at 2.83 today. No CBC since admit 1/10. No signs/symptoms of bleeding reported.  SCr 1.70, now up to 1.93 today. H/o CKD stage 3.   Goal of Therapy:  INR 2-3 Monitor platelets by anticoagulation protocol: Yes   Plan:  Warfarin 6mg  x1 tonight 1/11 Monitor INR and CBC daily Monitor for s/sx of bleeding  Thank you for allowing  [place] to participate in this patients care. [name], RPh Clinical Pharmacist [phone]-4P [phone] After 4pm call [place] [phone] 02/22/2017 3:51 PM

## 2017-02-22 NOTE — Progress Notes (Signed)
Subjective: Patient is markedly improved  Exam: Vitals:   02/22/17 1332 02/22/17 1653  BP: 123/72 138/78  Pulse: (!) 59 60  Resp: 16 16  Temp: 97.7 F (36.5 C) 98.2 F (36.8 C)  SpO2: 98% 99%   Gen: In bed, NAD Resp: non-labored breathing, no acute distress Abd: soft, nt  Neuro: MS: Awake, alert, oriented.  Recall is 2/3, able to spell world backwards and perform serial sevens very quickly.  Describes the "cookie caper" stroke picture very well including both sides.  Names all objects very quickly.  Able to recall sentences very quickly.  Able to tell time, normal clock drawing CN: Pupils equal round and reactive, extra ocular movements intact Motor: Moves all extremities well Sensory: Intact light touch  Pertinent Labs: Creatinine 1.9  Impression: 84 year old male with recurrent episodes of stereotyped apraxia/neglect.  He does not have any vascular abnormalities which I think would explain recurrent TIAs in this distribution.  There is a question of thrombus in the left internal jugular but he is anticoagulated in any case and this will be incidental and unrelated.  Given the markedly episodic nature of these deficits, I think that the differential is limited to some degree.  Seizures I think are significant possibility that the duration is quite long for seizure.  It is also quite long for TIA.  The focality I think makes a waxing/waning delirium very unlikely.  At this point, though the diagnosis of seizure is not definite, I do think an antiepileptic trial would be reasonable, though I would not aggressively uptitrate without further characterization of the spells.  With his improvement, I think paraneoplastic syndrome is less likely, and especially given his anticoagulation I do not think an LP would be significantly useful at this time.  Recommendations: 1) lacosamide 100 mill grams twice daily 2) CT chest per internal medicine 3) neurology will follow  Roland Rack, MD [facility] [phone]  If 7pm- 7am, please page neurology on call as listed in [place].

## 2017-02-22 NOTE — Evaluation (Signed)
Speech Language Pathology Evaluation Patient Details Name: Nat Lowenthal MRN: [id number] DOB: [birth year] Today's Date: 02/22/2017 Time: 5809-9833 SLP Time Calculation (min) (ACUTE ONLY): 18 min  Problem List:  Patient Active Problem List   Diagnosis Date Noted  . Cerebral thrombosis with cerebral infarction 02/21/2017  . Cerebral embolism with cerebral infarction 02/21/2017  . Subarachnoid hemorrhage 02/21/2017  . Confusion 02/20/2017  . Brain aneurysm   . Thyroid disease   . Hyperlipidemia with target LDL less than 70   . Essential hypertension   . Coronary artery disease involving native heart with angina pectoris ([place])   . COPD (chronic obstructive pulmonary disease) ([place])   . Aortic stenosis, mild 09/13/2016  . Ischemic cardiomyopathy 08/28/2016  . Moderate pulmonic regurgitation and right ventricular dilation by prior echocardiogram 08/28/2016  . Mild aortic insufficiency 08/28/2016  . Elevated troponin   . Chronic combined systolic and diastolic heart failure, NYHA class 3 ([place]) 08/26/2016  . CKD (chronic kidney disease), stage III ([place]) 08/26/2016  . Persistent atrial fibrillation ([place]); CHA2DS2Vasc = ~4 (CHF, MI/aortic plaque, Age 49) 08/21/2016  . Biventricular ICD (implantable cardioverter-defibrillator) in place 09/13/2012  . CAD s/p CABG 08/26/2012  . Presence of biventricular implantable cardioverter-defibrillator (ICD) 02/11/2006   Past Medical History:  Past Medical History:  Diagnosis Date  . Brain aneurysm   . Chronic combined systolic and diastolic heart failure, NYHA class 3 (HCC)    EF has seemed to vary report-to-report. Myoview 2007 showed EF 30%, echo in 2016 7 EF 60-65% -> Echo in 08/2015 & 08/2016  40-45%  (with basal-mid inferoseptal & apical inferolateral wall), Mod P HTN (PAP ~53 mHg)  . CKD (chronic kidney disease), stage III ([place]) 08/26/2016  . COPD (chronic obstructive pulmonary disease) ([place])   . Coronary artery disease involving native heart with  angina pectoris ([place])    a. MI 2002 - PCI to [place]. b. 2005: PCI to LAD, OM1 & OM2.  c. s/p CABG in 2013: LIMA to LAD, SVG to OM2, SVG to PDA and SVG to RI. ; d. Cath 2017: occluded LAD, OM1 & OM2, SVG-RI with patent LIMA-LAD, SVG-rPDA & SVG-OM2.   . Essential hypertension   . Hyperlipidemia with target LDL less than 70   . Mild aortic insufficiency 08/28/2016   noted on Echo  . 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 Persistent atrial fibrillation ([place])   . Presence of biventricular implantable cardioverter-defibrillator (ICD) 2008  . Thyroid disease    Past Surgical History:  Past Surgical History:  Procedure Laterality Date  . APPENDECTOMY    . CARDIAC CATHETERIZATION  06/2015   Left main patent. Diffuse LAD disease with competitive flow and distal LAD from LIMA. Mid Cx 35% with occlusion of OM 1 and 90% dCx. OM 2 occluded (perfuse via SVG). Ostial RCA 70%, dRCA 100%.  Patent LIMA-LAD. Widely patent SVG-OM, SVG-PDA: 40% proximal. 100% proximal SVG-RI  . CARDIAC SURGERY    . CARPAL TUNNEL RELEASE Right 2017  . CORONARY ANGIOPLASTY WITH STENT PLACEMENT     Prior PCI to OM 2; 2005 PCI to LAD and OM1 with a Taxus DES 2.5 x 16 mm (postdilated to 3.8 mm) and PTCA of OM 2 stent  . CORONARY ARTERY BYPASS GRAFT  2013   LIMA-LAD, SVG-L1-2, SVG-PDA, SVG-RI (known occlusion of SVG RI)  . KNEE SURGERY Bilateral 1999  . NM MYOVIEW LTD  04/2015   Prior lateral  infarct with no scanning. EF 31% with moderate global HK  . SHOULDER SURGERY Bilateral    Twice  . TRANSTHORACIC ECHOCARDIOGRAM  08/2015   Moderate LV dysfunction with global HK (estimated at 40 and 45%). Worse anterolateral and inferolateral wall.  . TRANSTHORACIC ECHOCARDIOGRAM  08/2016   EF 40-45%. Moderate concentric LVH. Akinesis of basal-mid inferoseptal and apical inferolateral wall.  Aortic sclerosis, no stenosis. Moderate mitral thickening  with mild MR. Moderate RV dilation. Moderate TR and PR. PA pressures 53 mmHg  . VEIN BYPASS SURGERY  2013   HPI:  Pt is an 84 y.o. male with medical history significant of hypertension, hyperlipidemia, COPD, GERD, hypothyroidism, ICD placement, atrial fibrillation on Coumadin, CAD, CABG, stent placement, CHF with EF of 40%, brain aneurysm, aortic stenosis, aortic insufficiency, chronically elevated troponin (0.11-0.13), who presents with confusion, forgetfulness and difficulty speaking. CT head is negative for acute intracranial abnormalities.  EEG normal.   Assessment / Plan / Recommendation Clinical Impression  At time of evaluation, pt scored 28/30 on MMSE, indicating no cognitive impairment. He does report fluctuating confusion and word-finding difficulties which began Dec.31. Delayed recall impaired (1/3 words), however naming, conversational speech, problem solving, sequencing and functional recall appear intact. Pt was able to recall details from earlier conversation with MD.  At this time no further skilled ST needs identified. Educated pt and family re: intermittent supervision with cognitive tasks such as medications initially for safety upon d/c, consider f/u ST should he experience persisting difficulties with word-finding, confusion.    SLP Assessment  SLP Recommendation/Assessment: Patient does not need any further Speech Lanaguage Pathology Services SLP Visit Diagnosis: Cognitive communication deficit (R41.841)    Follow Up Recommendations  Other (comment)(Intermittent supervision)    Frequency and Duration           SLP Evaluation Cognition  Overall Cognitive Status: (fluctating impairments) Arousal/Alertness: Awake/alert Orientation Level: Oriented X4 Attention: Selective Selective Attention: Appears intact Memory: Impaired Memory Impairment: Decreased recall of new information Awareness: Appears intact Problem Solving: Appears intact Executive Function:  Reasoning;Sequencing Reasoning: Appears intact Sequencing: Appears intact Safety/Judgment: Appears intact       Comprehension  Auditory Comprehension Overall Auditory Comprehension: Appears within functional limits for tasks assessed Yes/No Questions: Within Functional Limits Commands: Within Functional Limits Conversation: Simple Visual Recognition/Discrimination Discrimination: Within Function Limits Reading Comprehension Reading Status: Within funtional limits    Expression Expression Primary Mode of Expression: Verbal Verbal Expression Overall Verbal Expression: Appears within functional limits for tasks assessed Initiation: No impairment Automatic Speech: Name;Social Response Repetition: No impairment Naming: No impairment Pragmatics: No impairment Written Expression Dominant Hand: Right Written Expression: Within Functional Limits(sentence level)   Oral / Motor  Oral Motor/Sensory Function Overall Oral Motor/Sensory Function: Within functional limits Motor Speech Overall Motor Speech: Appears within functional limits for tasks assessed   GO                    Aliene Altes 02/22/2017, 1:21 PM  Deneise Lever, [place], [place] Pathologist [phone]

## 2017-02-22 NOTE — Progress Notes (Signed)
PROGRESS NOTE   Rohn Fritsch  BJY:[id number]    DOB: 11/09/1926    DOA: 02/20/2017  PCP: [name], DO   I have briefly reviewed patients [facility].  Brief Narrative:  84 year old male, lives with his daughter and son-in-law, independent of activities, PMH of HTN, HLD, COPD, GERD, hypothyroid, ICD placement, atrial fibrillation on Coumadin, CAD, CABG, chronic systolic CHF, brain aneurysm, aortic stenosis, aortic insufficiency, chronically elevated troponin, presented to ED with confusion, forgetfulness and difficulty speaking. As per daughter, 2 weeks history of episodic confusion and apraxia. Neurology consulted.   Assessment & Plan:   Principal Problem:   Confusion Active Problems:   Persistent atrial fibrillation (HCC); CHA2DS2Vasc = ~4 (CHF, MI/aortic plaque, Age 62)   Chronic combined systolic and diastolic heart failure, NYHA class 3 (HCC)   CKD (chronic kidney disease), stage III (HCC)   Elevated troponin   Presence of biventricular implantable cardioverter-defibrillator (ICD)   Hyperlipidemia with target LDL less than 70   Essential hypertension   Coronary artery disease involving native heart with angina pectoris (HCC)   COPD (chronic obstructive pulmonary disease) (HCC)   Brain aneurysm   Cerebral thrombosis with cerebral infarction   Cerebral embolism with cerebral infarction   Subarachnoid hemorrhage   1. Confusion and apraxia: Neurology consultation appreciated. Discussed with Dr. Leonie Man. Suspects underlying dementia and rule out reversible causes. CT head 1/10: No acute intracranial abnormalities. Unable to do MRI because of ICD. CTA head and neck 1/11: Widespread atherosclerotic vascular disease. 14 x 10 x 10 mm aneurysm at right MCA bifurcation. TSH normal. A1c 6.3. B12 379. On aspirin. Per SLP> MMSE 28/30. ? Improving. Neurology follow-up pending. 2. Persistent atrial fibrillation: Paced rhythm on telemetry.  Anticoagulated on Coumadin. Continue metoprolol. 3. Stage III chronic kidney disease: Baseline creatinine in the 1.7-? 1.9 range. Increased from 1.7-1.93. Received CTA contrast 1/11. Hold Bumex today. Gentle IV fluids. Follow BMP in a.m. Hold off on CT chest with contrast for today. 4. CAD, CABG, chronic troponin elevation: No chest pain reported. Continue aspirin, statins and beta blockers. 5. Hyperlipidemia: Statins. 6. Essential hypertension: Reasonable control. Continue metoprolol. 7. COPD: Stable without bronchospasm. 8. Right MCA aneurysm: Noted. 9. Chronic systolic and diastolic CHF: 2-D echo LVEF 45-50 percent. Clinically compensated. Temporarily hold Bumex. Brief IV fluids due to contrast. 10. RUL density: Noted on CTA head and neck. CT chest to assess.   DVT prophylaxis: Anticoagulated on Coumadin. Code Status: DO NOT RESUSCITATE. Family Communication: None at bedside today. Disposition: Home with home health PT when medically improved.   Consultants:  Neurology   Procedures:  As above  Antimicrobials:  None    Subjective: Patient seen ambulating with PT. Thinks that he feels better but still reports some confusion. SLP input appreciated.  ROS: As above.  Objective:  Vitals:   02/22/17 0228 02/22/17 0700 02/22/17 0855 02/22/17 1332  BP: 118/65 (!) 141/82 (!) 141/68 123/72  Pulse: 60 (!) 59 (!) 59 (!) 59  Resp: 18 18 20 16   Temp: 98 F (36.7 C) 97.8 F (36.6 C) (!) 97.5 F (36.4 C) 97.7 F (36.5 C)  TempSrc: Oral Oral Oral Oral  SpO2: 96% 96% 100% 98%  Weight:      Height:        Examination:  General exam: Elderly male, moderately built and nourished, seen ambulating with walker and PT supervision. Respiratory system: Clear to auscultation. Respiratory effort normal. Stable without change. Cardiovascular system: S1 & S2 heard,  RRR. No JVD, murmurs, rubs, gallops or clicks. No pedal edema. Telemetry: V paced rhythm. Stable without  change. Gastrointestinal system: Abdomen is nondistended, soft and nontender. No organomegaly or masses felt. Normal bowel sounds heard. Central nervous system: Alert and oriented to person, place and partly to time. Apraxia +, seems better today. No focal neurological deficits. Extremities: Symmetric 5 x 5 power. Skin: No rashes, lesions or ulcers Psychiatry: Judgement and insight impaired. Mood & affect appropriate.     Data Reviewed: I have personally reviewed following labs and imaging studies  CBC: Recent Labs  Lab 02/20/17 1530 02/20/17 1544  WBC 4.6  --   NEUTROABS 2.9  --   HGB 12.6* 13.3  HCT 39.8 39.0  MCV 98.0  --   PLT 250  --    Basic Metabolic Panel: Recent Labs  Lab 02/20/17 1530 02/20/17 1544 02/21/17 1121 02/22/17 0248  NA 137 140  --  137  K 4.3 4.2  --  3.8  CL 101 101  --  105  CO2 26  --   --  24  GLUCOSE 106* 104*  --  122*  BUN 41* 39*  --  40*  CREATININE 1.78* 1.70*  --  1.93*  CALCIUM 10.0  --   --  9.5  MG  --   --  1.8  --   PHOS  --   --  2.5  --    Liver Function Tests: Recent Labs  Lab 02/20/17 1530  AST 30  ALT 16*  ALKPHOS 89  BILITOT 1.2  PROT 7.3  ALBUMIN 3.6   Coagulation Profile: Recent Labs  Lab 02/20/17 1530 02/21/17 0336 02/22/17 0248  INR 2.68 2.76 2.83   Cardiac Enzymes: Recent Labs  Lab 02/20/17 2310 02/21/17 0336 02/21/17 1121  TROPONINI 0.12* 0.14* 0.13*   HbA1C: Recent Labs    02/21/17 0336  HGBA1C 6.3*   CBG: No results for input(s): GLUCAP in the last 168 hours.  No results found for this or any previous visit (from the past 240 hour(s)).       Radiology Studies: Ct Angio Head W Or Wo Contrast  Result Date: 02/21/2017 CLINICAL DATA:  Right-sided weakness, confusion and aphasia. History brain aneurysm. EXAM: CT ANGIOGRAPHY HEAD AND NECK TECHNIQUE: Multidetector CT imaging of the head and neck was performed using the standard protocol during bolus administration of intravenous contrast.  Multiplanar CT image reconstructions and MIPs were obtained to evaluate the vascular anatomy. Carotid stenosis measurements (when applicable) are obtained utilizing NASCET criteria, using the distal internal carotid diameter as the denominator. CONTRAST:  54mL ISOVUE-370 IOPAMIDOL (ISOVUE-370) INJECTION 76% COMPARISON:  CT 02/11/2017 and 02/20/2017 FINDINGS: Low to moderate arterial contrast opacity. CTA NECK FINDINGS Aortic arch: Atherosclerosis of the arch. No aneurysm or dissection. 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 Right carotid system: Common carotid artery shows atherosclerotic plaque but is patent to the bifurcation region. Extensive calcified plaque at the carotid bifurcation and proximal ICA. Minimal diameter is 4.5 mm. Compared to a more distal cervical ICA diameter of the same, there is no stenosis. Left carotid system: Common carotid artery shows plaque but no stenosis. At the bifurcation, there is extensive calcified plaque affecting the ICA bulb. Minimal diameter at the distal bulb is 4.5 mm, this same as the more distal cervical ICA, therefore no stenosis. Vertebral arteries: Vertebral artery origins are poorly seen due  to low contrast opacity. Both vertebral arteries do show flow and are approximately equal in size. No stenosis seen in the cervical region. Skeleton: Ordinary cervical spondylosis. Other neck: Venous reflux in the left jugular vein raises the possibility nonocclusive thrombus. This is not definite. Findings could be flow related. Multiple bilateral thyroid nodules. Upper chest: Abnormal density in the central right upper lobe measuring up to 1.8 cm. This could represent infiltrate or a mass. Recommend complete chest CT when able. Review of the MIP images confirms the above findings CTA HEAD FINDINGS Anterior circulation: Both internal carotid arteries  are patent through the skull base and siphon regions. There is typical peripheral atherosclerotic calcification throughout the carotid siphon regions without stenosis greater than 50%. The anterior and middle cerebral vessels show atherosclerotic calcification. No flow limiting stenosis or vessel occlusion is identified. Peripherally calcified right MCA bifurcation aneurysm measuring 14 x 10 x 11 mm. Main branches arise directly from the body of the aneurysm. Posterior circulation: Atherosclerotic disease of both vertebral arteries at the foramen magnum level with narrowing of 30-50%. Calcified plaque affecting the distal left vertebral artery with stenosis of 70%. Atherosclerotic disease affecting the basilar artery. Proximal basilar fenestration. Areas of stenosis in the proximal basilar artery estimated at 30-50%. Superior cerebellar and posterior cerebral arteries show flow. Fetal origin of the left PCA. Venous sinuses: Patent and normal. Anatomic variants: None other significant. Delayed phase: No abnormal enhancement. Review of the MIP images confirms the above findings IMPRESSION: Widespread atherosclerotic vascular disease affecting all arterial structures. 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 Atherosclerotic calcification at both carotid bifurcation regions. No measurable stenosis. Atherosclerotic calcification in both carotid siphon regions with narrowing estimated at 30-50% on each side. No intracranial flow limiting stenosis or occlusion of the large or medium size vessels. Distal vessel atherosclerotic change diffusely. 14 x 10 x 11 mm aneurysm at the right MCA bifurcation with major branch vessels arising directly from the aneurysm. Stenoses of the distal vertebral arteries and basilar artery estimated at 30-50%. 70% stenosis of the distal left vertebral artery. Distal vessel atherosclerotic change diffusely. Question nonocclusive  thrombus within the left jugular vein. 1.8 cm density in the right upper lobe which is indeterminate. This could be an infiltrate or mass. Complete chest CT suggested when able. Electronically Signed   By: Nelson Chimes M.D.   On: 02/21/2017 11:08   Ct Head Wo Contrast  Result Date: 02/20/2017 CLINICAL DATA:  Subacute neuro deficit, 3 episodes of confusion in the past week, able to verbalize when he needs to do but unable to perform actions, post unable to turn on television, dress himself or balance Hist check buttock EXAM: CT HEAD WITHOUT CONTRAST TECHNIQUE: Contiguous axial images were obtained from the base of the skull through the vertex without intravenous contrast. Sagittal and coronal MPR images reconstructed from axial data set. COMPARISON:  02/11/2017 FINDINGS: Brain: Asymmetric positioning in gantry. Generalized atrophy. Normal ventricular morphology. No midline shift or mass effect. Small vessel chronic ischemic changes of deep cerebral white matter. No intracranial hemorrhage, mass lesion or evidence of acute infarction. No extra-axial fluid collections. Vascular: Aneurysmal dilatation of the RIGHT middle cerebral artery 9.4 mm diameter. Extensive atherosclerotic calcifications of internal carotid, middle cerebral, and vertebral arteries at skull base. Skull: Demineralized but intact Sinuses/Orbits: Question mucosal retention cysts in maxillary sinuses. Mobile opacification within the sphenoid sinus question mucous. Mastoid air cells clear. Other: N/A IMPRESSION: Atrophy with small  vessel chronic ischemic changes of deep cerebral white matter. No acute intracranial abnormalities. 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 Electronically Signed   By: Lavonia Dana M.D.   On: 02/20/2017 17:01   Ct Angio Neck W Or Wo Contrast  Result Date: 02/21/2017 CLINICAL DATA:  Right-sided weakness, confusion and  aphasia. History brain aneurysm. EXAM: CT ANGIOGRAPHY HEAD AND NECK TECHNIQUE: Multidetector CT imaging of the head and neck was performed using the standard protocol during bolus administration of intravenous contrast. Multiplanar CT image reconstructions and MIPs were obtained to evaluate the vascular anatomy. Carotid stenosis measurements (when applicable) are obtained utilizing NASCET criteria, using the distal internal carotid diameter as the denominator. CONTRAST:  77mL ISOVUE-370 IOPAMIDOL (ISOVUE-370) INJECTION 76% COMPARISON:  CT 02/11/2017 and 02/20/2017 FINDINGS: Low to moderate arterial contrast opacity. CTA NECK FINDINGS Aortic arch: Atherosclerosis of the arch. No aneurysm or dissection. 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 Right carotid system: Common carotid artery shows atherosclerotic plaque but is patent to the bifurcation region. Extensive calcified plaque at the carotid bifurcation and proximal ICA. Minimal diameter is 4.5 mm. Compared to a more distal cervical ICA diameter of the same, there is no stenosis. Left carotid system: Common carotid artery shows plaque but no stenosis. At the bifurcation, there is extensive calcified plaque affecting the ICA bulb. Minimal diameter at the distal bulb is 4.5 mm, this same as the more distal cervical ICA, therefore no stenosis. Vertebral arteries: Vertebral artery origins are poorly seen due to low contrast opacity. Both vertebral arteries do show flow and are approximately equal in size. No stenosis seen in the cervical region. Skeleton: Ordinary cervical spondylosis. Other neck: Venous reflux in the left jugular vein raises the possibility nonocclusive thrombus. This is not definite. Findings could be flow related. Multiple bilateral thyroid nodules. Upper chest: Abnormal density in the central right upper lobe measuring up  to 1.8 cm. This could represent infiltrate or a mass. Recommend complete chest CT when able. Review of the MIP images confirms the above findings CTA HEAD FINDINGS Anterior circulation: Both internal carotid arteries are patent through the skull base and siphon regions. There is typical peripheral atherosclerotic calcification throughout the carotid siphon regions without stenosis greater than 50%. The anterior and middle cerebral vessels show atherosclerotic calcification. No flow limiting stenosis or vessel occlusion is identified. Peripherally calcified right MCA bifurcation aneurysm measuring 14 x 10 x 11 mm. Main branches arise directly from the body of the aneurysm. Posterior circulation: Atherosclerotic disease of both vertebral arteries at the foramen magnum level with narrowing of 30-50%. Calcified plaque affecting the distal left vertebral artery with stenosis of 70%. Atherosclerotic disease affecting the basilar artery. Proximal basilar fenestration. Areas of stenosis in the proximal basilar artery estimated at 30-50%. Superior cerebellar and posterior cerebral arteries show flow. Fetal origin of the left PCA. Venous sinuses: Patent and normal. Anatomic variants: None other significant. Delayed phase: No abnormal enhancement. Review of the MIP images confirms the above findings IMPRESSION: Widespread atherosclerotic vascular disease affecting all arterial structures. 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 Atherosclerotic calcification at both carotid bifurcation regions. No measurable stenosis. Atherosclerotic calcification in both carotid siphon regions with narrowing estimated at 30-50% on each side. No intracranial flow limiting stenosis or occlusion of the large or  medium size vessels. Distal vessel atherosclerotic change diffusely. 14 x 10 x 11 mm aneurysm at the right MCA bifurcation with major branch vessels arising directly from  the aneurysm. Stenoses of the distal vertebral arteries and basilar artery estimated at 30-50%. 70% stenosis of the distal left vertebral artery. Distal vessel atherosclerotic change diffusely. Question nonocclusive thrombus within the left jugular vein. 1.8 cm density in the right upper lobe which is indeterminate. This could be an infiltrate or mass. Complete chest CT suggested when able. Electronically Signed   By: Nelson Chimes M.D.   On: 02/21/2017 11:08        Scheduled Meds: . aspirin EC  81 mg Oral Daily  . [START ON 02/23/2017] bumetanide  2 mg Oral Daily  . levothyroxine  50 mcg Oral QAC breakfast  . metoprolol succinate  25 mg Oral Daily  . pantoprazole  40 mg Oral Daily  . potassium chloride  10 mEq Oral Daily  . simvastatin  20 mg Oral q1800  . vitamin B-12  1,000 mcg Oral Daily  . warfarin  6 mg Oral ONCE-1800  . Warfarin - Pharmacist Dosing Inpatient   Does not apply q1800   Continuous Infusions: . sodium chloride 75 mL/hr at 02/22/17 0826     LOS: 1 day     Vernell Leep, MD, FACP, [facility]. [facility] Pager [phone]  If 7PM-7AM, please contact night-coverage [email or web address] Password [facility] 02/22/2017, 4:25 PM

## 2017-02-22 NOTE — Evaluation (Signed)
Physical Therapy Evaluation Patient Details Name: Kenneth Bell MRN: [id number] DOB: [birth year] Today's Date: 02/22/2017   History of Present Illness  Pt is an 84 y.o. male with medical history significant of hypertension, hyperlipidemia, COPD, GERD, hypothyroidism, ICD placement, atrial fibrillation on Coumadin, CAD, CABG, stent placement, CHF with EF of 40%, brain aneurysm, aortic stenosis, aortic insufficiency, chronically elevated troponin (0.11-0.13), who presents with confusion, forgetfulness and difficulty speaking. CT head is negative for acute intracranial abnormalities.     Clinical Impression  Pt presented sitting OOB in recliner chair, awake and willing to participate in therapy session. Prior to admission, pt reported he was independent with all functional mobility and ADLs. Pt currently requires min guard for safety with transfers and ambulation with RW. Pt would continue to benefit from skilled physical therapy services at this time while admitted and after d/c to address the below listed limitations in order to improve overall safety and independence with functional mobility.     Follow Up Recommendations Home health PT;Supervision - Intermittent    Equipment Recommendations  Rolling walker with 5" wheels    Recommendations for Other Services       Precautions / Restrictions Precautions Precautions: Fall Restrictions Weight Bearing Restrictions: No      Mobility  Bed Mobility               General bed mobility comments: pt OOB in recliner chair upon arrival  Transfers Overall transfer level: Needs assistance   Transfers: Sit to/from Stand Sit to Stand: Min guard         General transfer comment: min guard for safety; pt pushing up with bilateral UEs on arm rests of chair and then reaching for RW after already standing  Ambulation/Gait Ambulation/Gait assistance: Min guard Ambulation Distance (Feet): 200 Feet Assistive device: Rolling walker (2  wheeled) Gait Pattern/deviations: Step-through pattern;Decreased stride length;Drifts right/left Gait velocity: decreased   General Gait Details: pt required frequent cueing for safety with use of RW; no overt LOB or need for physical assistance, min guard for safety  Stairs            Wheelchair Mobility    Modified Rankin (Stroke Patients Only)       Balance Overall balance assessment: Needs assistance Sitting-balance support: Feet supported Sitting balance-Leahy Scale: Good     Standing balance support: During functional activity Standing balance-Leahy Scale: Fair                               Pertinent Vitals/Pain Pain Assessment: No/denies pain    Home Living Family/patient expects to be discharged to:: Private residence Living Arrangements: Children;Other (Comment)(son and daughter-in-law) Available Help at Discharge: Family;Available PRN/intermittently Type of Home: House Home Access: Level entry     Home Layout: One level Home Equipment: Walker - 4 wheels;Cane - single point;Shower seat - built in Additional Comments: Son and daughter in [employer] both work. Pt reports visiting spouse in nursing home (dementia) regularly.     Prior Function Level of Independence: Independent               Hand Dominance        Extremity/Trunk Assessment   Upper Extremity Assessment Upper Extremity Assessment: Defer to OT evaluation    Lower Extremity Assessment Lower Extremity Assessment: Overall WFL for tasks assessed       Communication   Communication: No difficulties  Cognition Arousal/Alertness: Awake/alert Behavior During Therapy: WFL for tasks assessed/performed  Overall Cognitive Status: Impaired/Different from baseline Area of Impairment: Memory;Safety/judgement;Problem solving                     Memory: Decreased short-term memory   Safety/Judgement: Decreased awareness of safety;Decreased awareness of deficits   Problem  Solving: Difficulty sequencing;Requires verbal cues        General Comments      Exercises     Assessment/Plan    PT Assessment Patient needs continued PT services  PT Problem List Decreased balance;Decreased mobility;Decreased coordination;Decreased cognition;Decreased knowledge of use of DME;Decreased safety awareness       PT Treatment Interventions DME instruction;Gait training;Stair training;Functional mobility training;Therapeutic activities;Balance training;Neuromuscular re-education;Therapeutic exercise;Patient/family education    PT Goals (Current goals can be found in the Care Plan section)  Acute Rehab PT Goals Patient Stated Goal: return home PT Goal Formulation: With patient Time For Goal Achievement: 03/08/17 Potential to Achieve Goals: Good    Frequency Min 3X/week   Barriers to discharge        Co-evaluation               AM-PAC PT "6 Clicks" Daily Activity  Outcome Measure Difficulty turning over in bed (including adjusting bedclothes, sheets and blankets)?: None Difficulty moving from lying on back to sitting on the side of the bed? : A Little Difficulty sitting down on and standing up from a chair with arms (e.g., wheelchair, bedside commode, etc,.)?: A Little Help needed moving to and from a bed to chair (including a wheelchair)?: A Little Help needed walking in hospital room?: A Little Help needed climbing 3-5 steps with a railing? : A Lot 6 Click Score: 18    End of Session Equipment Utilized During Treatment: Gait belt Activity Tolerance: Patient tolerated treatment well Patient left: in chair;with call bell/phone within reach;with chair alarm set Nurse Communication: Mobility status PT Visit Diagnosis: Other abnormalities of gait and mobility (R26.89)    Time: 1115-5208 PT Time Calculation (min) (ACUTE ONLY): 21 min   Charges:   PT Evaluation $PT Eval Moderate Complexity: 1 Mod     PT G Codes:        [place], PT,  DPT [place] 02/22/2017, 11:15 AM

## 2017-02-23 ENCOUNTER — Encounter (HOSPITAL_COMMUNITY): Payer: Self-pay | Admitting: Radiology

## 2017-02-23 ENCOUNTER — Inpatient Hospital Stay (HOSPITAL_COMMUNITY): Payer: Medicare Other

## 2017-02-23 DIAGNOSIS — I5042 Chronic combined systolic (congestive) and diastolic (congestive) heart failure: Secondary | ICD-10-CM

## 2017-02-23 LAB — BASIC METABOLIC PANEL
Anion gap: 10 (ref 5–15)
BUN: 35 mg/dL — ABNORMAL HIGH (ref 6–20)
CALCIUM: 9.8 mg/dL (ref 8.9–10.3)
CO2: 20 mmol/L — ABNORMAL LOW (ref 22–32)
CREATININE: 1.69 mg/dL — AB (ref 0.61–1.24)
Chloride: 107 mmol/L (ref 101–111)
GFR calc Af Amer: 40 mL/min — ABNORMAL LOW (ref >60)
GFR, EST NON AFRICAN AMERICAN: 34 mL/min — AB (ref >60)
GLUCOSE: 113 mg/dL — AB (ref 65–99)
POTASSIUM: 4.1 mmol/L (ref 3.5–5.1)
SODIUM: 137 mmol/L (ref 135–145)

## 2017-02-23 LAB — PROTIME-INR
INR: 2.66
PROTHROMBIN TIME: 28.2 s — AB (ref 11.4–15.2)

## 2017-02-23 MED ORDER — CYANOCOBALAMIN 1000 MCG PO TABS: 1000.0000 ug | ORAL_TABLET | Freq: Every day | ORAL | 0 refills | Status: DC

## 2017-02-23 MED ORDER — WARFARIN SODIUM 6 MG PO TABS
6.0000 mg | ORAL_TABLET | ORAL | Status: DC
  Filled 2017-02-23: qty 1

## 2017-02-23 MED ORDER — WARFARIN SODIUM 3 MG PO TABS: 3.0000 mg | ORAL_TABLET | ORAL | Status: DC

## 2017-02-23 MED ORDER — LACOSAMIDE 100 MG PO TABS: 100.0000 mg | ORAL_TABLET | Freq: Two times a day (BID) | ORAL | 0 refills | Status: DC

## 2017-02-23 MED ORDER — IOPAMIDOL (ISOVUE-300) INJECTION 61%
INTRAVENOUS | Status: AC
  Administered 2017-02-23: 50 mL
  Filled 2017-02-23: qty 50

## 2017-02-23 NOTE — Discharge Instructions (Signed)

## 2017-02-23 NOTE — Progress Notes (Signed)
OT Cancellation Note  Patient Details Name: Kenneth Bell MRN: [id number] DOB: 06-25-1926   Cancelled Treatment:    Reason Eval/Treat Not Completed: Other (comment)(pt eating lunch)  Benito Mccreedy OTR/L 02/23/2017, 11:41 AM

## 2017-02-23 NOTE — Progress Notes (Signed)
ANTICOAGULATION CONSULT NOTE -Follow up Pharmacy Consult for warfarin Indication: atrial fibrillation  Allergies  Allergen Reactions  . Nitroglycerin Other (See Comments)    DROPS BLOOD PRESSURE DRASTICALLY Other reaction(s): Other (See Comments) BP drops   Patient Measurements: Height: 5\' 10"  (177.8 cm) Weight: 194 lb (88 kg) IBW/kg (Calculated) : 73  Vital Signs: Temp: 98.1 F (36.7 C) (01/13 0918) Temp Source: Oral (01/13 0918) BP: 123/69 (01/13 1311) Pulse Rate: 59 (01/13 1311)  Labs: Recent Labs    02/20/17 1530 02/20/17 1544 02/20/17 2310 02/21/17 0336 02/21/17 1121 02/22/17 0248 02/23/17 0743 02/23/17 0744  HGB 12.6* 13.3  --   --   --   --   --   --   HCT 39.8 39.0  --   --   --   --   --   --   PLT 250  --   --   --   --   --   --   --   APTT 44*  --   --   --   --   --   --   --   LABPROT 28.3*  --   --  28.9*  --  29.5* 28.2*  --   INR 2.68  --   --  2.76  --  2.83 2.66  --   CREATININE 1.78* 1.70*  --   --   --  1.93*  --  1.69*  TROPONINI  --   --  0.12* 0.14* 0.13*  --   --   --     Estimated Creatinine Clearance: 33.8 mL/min (A) (by C-G formula based on SCr of 1.69 mg/dL (H)).   Medical History: Past Medical History:  Diagnosis Date  . Brain aneurysm   . Chronic combined systolic and diastolic heart failure, NYHA class 3 (HCC)    EF has seemed to vary report-to-report. Myoview 2007 showed EF 30%, echo in 2016 7 EF 60-65% -> Echo in 08/2015 & 08/2016  40-45%  (with basal-mid inferoseptal & apical inferolateral wall), Mod P HTN (PAP ~53 mHg)  . CKD (chronic kidney disease), stage III ([place]) 08/26/2016  . COPD (chronic obstructive pulmonary disease) ([place])   . Coronary artery disease involving native heart with angina pectoris ([place])    a. MI 2002 - PCI to [place]. b. 2005: PCI to LAD, OM1 & OM2.  c. s/p CABG in 2013: LIMA to LAD, SVG to OM2, SVG to PDA and SVG to RI. ; d. Cath 2017: occluded LAD, OM1 & OM2, SVG-RI with patent LIMA-LAD, SVG-rPDA & SVG-OM2.    . Essential hypertension   . Hyperlipidemia with target LDL less than 70   . Mild aortic insufficiency 08/28/2016   noted on Echo  . 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 Persistent atrial fibrillation ([place])   . Presence of biventricular implantable cardioverter-defibrillator (ICD) 2008  . Thyroid disease    Assessment:Kenneth Bell is a 84 y.o. male on warfarin PTA for atrial fibrillation. Admitted on 02/20/17 for intermittent episodes of confusion.  Admit  INR 2.68 in the ED. HgB 13.36, PLTC wnl.  CT negative for bleed; no anticipated procedures.  PTA warfarin dose: 3mg  on Mondays and Fridays, 6mg  on all other days (INR 2.68 on admit 1/10) last taken pta on 1/9  INR = 2.66, remains therapeutic x 4 days on his usual home warfarin dosage.  No CBC since admit  1/10. No signs/symptoms of bleeding reported.   H/o CKD stage 3.   Goal of Therapy:  INR 2-3 Monitor platelets by anticoagulation protocol: Yes   Plan:  Warfarin 6mg  daily except 3mg  every Mon and Friday Monitor INR change to q48h  Monitor for s/sx of bleeding  Thank you for allowing [place] to participate in this patients care. [name], RPh Clinical Pharmacist 8A-4P [phone] After 4pm call [place] [phone] 02/23/2017 2:31 PM

## 2017-02-23 NOTE — Care Management Note (Addendum)
Case Management Note  Patient Details  Name: Kenneth Bell MRN: [id number] Date of Birth: March 05, 1926  Subjective/Objective:          Admitted with confusion. Lives with daughter Bonnita Nasuti. PTA used cane with ambulation.    Nigel Sloop (Daughter)     [phone]       PCP: Riki Sheer  Action/Plan: Transition to home today with home health (RN,PT) services to follow.  Expected Discharge Date:  02/23/17               Expected Discharge Plan:  [place]  In-House Referral:     Discharge planning Services  CM Consult  Post Acute Care Choice:    Choice offered to:  Patient  DME Arranged:  3-N-1, Walker rolling DME Agency:  [place]:  PT, OT [facility] Agency:  [place]  Status of Service:  Completed, signed off  If discussed at [place] of Stay Meetings, dates discussed:    Additional Comments:  Sharin Mons, RN 02/23/2017, 1:07 PM

## 2017-02-23 NOTE — Progress Notes (Signed)
Subjective: Patient is lying in bed in NAD. No family at bedside. Appears at his mentation baseline. No reports of confusion overnight.  Exam: Vitals:   02/23/17 0252 02/23/17 0918  BP: (!) 155/83 113/65  Pulse: 95 (!) 59  Resp: 17 16  Temp: 98.1 F (36.7 C) 98.1 F (36.7 C)  SpO2: 98% 97%   Gen: In bed, NAD Resp: non-labored breathing, no acute distress Abd: soft, nt  Neuro: MS: Awake, alert, oriented x5. able to spell world backwards and perform serial sevens very quickly.  Describes the "cookie caper" stroke picture very well including both sides.  Names all objects very quickly.  Able to recall sentences very quickly.  Able to tell time, normal clock drawing. Able to quickly draw shapes on a paper. CN: Pupils equal round and reactive, extra ocular movements intact Motor: Moves all extremities well Sensory: Intact light touch  Pertinent Labs: Creatinine 1.69 today  Impression: 84 year old male with recurrent episodes of stereotyped apraxia/neglect.  02/22/2017 ASSESSMENT  He does not have any vascular abnormalities which I think would explain recurrent TIAs in this distribution.  There is a question of thrombus in the left internal jugular but he is anticoagulated in any case and this will be incidental and unrelated.  Given the markedly episodic nature of these deficits, I think that the differential is limited to some degree.  Seizures I think are significant possibility that the duration is quite long for seizure.  It is also quite long for TIA.  The focality I think makes a waxing/waning delirium very unlikely.  At this point, though the diagnosis of seizure is not definite, I do think an antiepileptic trial would be reasonable, though I would not aggressively uptitrate without further characterization of the spells.  With his improvement, I think paraneoplastic syndrome is less likely, and especially given his anticoagulation I do not think an LP would be significantly  useful at this time.  02/23/2017 ASSESSMENT Neuro exam is stable. Patient appears at this mentation baseline. No further episodes of confusion reported. Vimpat started overnight. No adverse side effects reported.   Discussed POC with daughter Bonnita Nasuti by phone and primary team Dr Algis Liming. Creatinine has improved to baseline level. Will obtain CT Chest today and then discharge home with close PCP and Neurology follow up.  Recommendations: 1)Continue  lacosamide 100 mill grams twice daily 2) CT chest per internal medicine 3) Outpatient Neurology follow up   Sailor Haughn Sella, ANP-C [facility] Team [phone]  If [place], please page neurology on call as listed in [place].

## 2017-02-23 NOTE — Progress Notes (Signed)
Occupational Therapy Treatment Patient Details Name: Nieves Barberi MRN: [id number] DOB: 07-30-1926 Today's Date: 02/23/2017    History of present illness Pt is an 84 y.o. male with medical history significant of hypertension, hyperlipidemia, COPD, GERD, hypothyroidism, ICD placement, atrial fibrillation on Coumadin, CAD, CABG, stent placement, CHF with EF of 40%, brain aneurysm, aortic stenosis, aortic insufficiency, chronically elevated troponin (0.11-0.13), who presents with confusion, forgetfulness and difficulty speaking. CT head is negative for acute intracranial abnormalities.    OT comments  Pt progressing. Education provided in session. Recommending HHOT and 24/7 supervision (at least initially).   Follow Up Recommendations  Home health OT;Supervision/Assistance - 24 hour    Equipment Recommendations  3 in 1 bedside commode;Other (comment)(RW with 2 wheels)    Recommendations for Other Services      Precautions / Restrictions Precautions Precautions: Fall Restrictions Weight Bearing Restrictions: No       Mobility Bed Mobility               General bed mobility comments: not assessed  Transfers Overall transfer level: Needs assistance   Transfers: Sit to/from Stand Sit to Stand: Min guard         General transfer comment: RW in front of pt    Balance      Used RW for ambulation.                                     ADL either performed or assessed with clinical judgement   ADL Overall ADL's : Needs assistance/impaired     Grooming: Wash/dry face;Set up;Supervision/safety;Standing               Lower Body Dressing: Min guard;Sit to/from stand Lower Body Dressing Details (indicate cue type and reason): donned underwear Toilet Transfer: RW;Minimal assistance(sit to stand from chair; Min A ambulating)           Functional mobility during ADLs: Minimal assistance;Rolling walker(assisted and educated on use of  walker.) General ADL Comments: Explained there is AE to assist with LB dressing as pt struggled to get on underwear. Educated on dressing technique. Educated on safety such as using shower chair-sitting, items on floor, safe footwear, and use of bag on walker.      Vision       Perception     Praxis      Cognition Arousal/Alertness: Awake/alert Behavior During Therapy: WFL for tasks assessed/performed Overall Cognitive Status: Impaired/Different from baseline Area of Impairment: Orientation;Problem solving                 Orientation Level: Disoriented to;Time           Problem Solving: Slow processing General Comments: cues given as pt turned on cold water instead of hot. Also suggested using a calendar.         Exercises     Shoulder Instructions       General Comments      Pertinent Vitals/ Pain       Pain Assessment: No/denies pain  Home Living                                          Prior Functioning/Environment              Frequency  Min 2X/week  Progress Toward Goals  OT Goals(current goals can now be found in the care plan section)  Progress towards OT goals: Progressing toward goals  Acute Rehab OT Goals Patient Stated Goal: not stated OT Goal Formulation: With patient Time For Goal Achievement: 03/07/17 Potential to Achieve Goals: Good ADL Goals Pt Will Perform Grooming: with modified independence;sitting;standing Pt Will Perform Upper Body Dressing: with set-up;sitting Pt Will Perform Lower Body Dressing: with supervision;sit to/from stand;with set-up Pt Will Transfer to Toilet: with supervision;ambulating Pt Will Perform Toileting - Clothing Manipulation and hygiene: with set-up;with supervision;sit to/from stand;sitting/lateral leans Pt Will Perform Tub/Shower Transfer: with supervision;ambulating  Plan Discharge plan needs to be updated    Co-evaluation                 AM-PAC PT "6  Clicks" Daily Activity     Outcome Measure   Help from another person eating meals?: None Help from another person taking care of personal grooming?: A Little Help from another person toileting, which includes using toliet, bedpan, or urinal?: A Little Help from another person bathing (including washing, rinsing, drying)?: A Little Help from another person to put on and taking off regular upper body clothing?: A Little Help from another person to put on and taking off regular lower body clothing?: A Little 6 Click Score: 19    End of Session Equipment Utilized During Treatment: Gait belt;Rolling walker  OT Visit Diagnosis: Unsteadiness on feet (R26.81)   Activity Tolerance Patient tolerated treatment well   Patient Left in chair;with call bell/phone within reach;with family/visitor present;with chair alarm set   Nurse Communication Other (comment)(d/c recommendations/equipment)        Time: 6333-5456 OT Time Calculation (min): 20 min  Charges: OT General Charges $OT Visit: 1 Visit OT Treatments $Self Care/Home Management : 8-22 mins   Sheila Oats [place] OTR/L 02/23/2017, 12:32 PM

## 2017-02-23 NOTE — Progress Notes (Signed)
Patient ready for discharge to home; delay was waiting for equipment; patient discharge instructions given and reviewed; Rx's sent electronically; son present at bedside for instructions. Patient dressed and discharged out via wheelchair accompanied home by his son.

## 2017-02-23 NOTE — Discharge Summary (Addendum)
Physician Discharge Summary  Kenneth Bell WCH:[id number] DOB: 09/27/1926  PCP: [name], DO  Admit date: 02/20/2017 Discharge date: 02/23/2017  Recommendations for Outpatient Follow-up:  1. Dr. [name], PCP in 3 days with repeat labs (CBC & BMP). 2. Dr. Antony Contras, Neurology in 6 weeks. 3. Dr. Luanne Bras, IR , follow-up regarding brain aneurysm.  Home Health: PT and OT Equipment/Devices: Rolling walker with 2 wheels, 3 in 1.    Discharge Condition: Improved and stable.  CODE STATUS: DO NOT RESUSCITATE.  Diet recommendation: Heart healthy diet.  Discharge Diagnoses:  Principal Problem:   Confusion Active Problems:   Persistent atrial fibrillation (HCC); CHA2DS2Vasc = ~4 (CHF, MI/aortic plaque, Age 74)   Chronic combined systolic and diastolic heart failure, NYHA class 3 (HCC)   CKD (chronic kidney disease), stage III (HCC)   Elevated troponin   Presence of biventricular implantable cardioverter-defibrillator (ICD)   Hyperlipidemia with target LDL less than 70   Essential hypertension   Coronary artery disease involving native heart with angina pectoris (HCC)   COPD (chronic obstructive pulmonary disease) ([place])   Brain aneurysm   Cerebral thrombosis with cerebral infarction   Cerebral embolism with cerebral infarction   Subarachnoid hemorrhage   Brief Summary: 84 year old male, lives with his daughter and son-in-law, independent of activities, PMH of HTN, HLD, COPD, GERD, hypothyroid, ICD placement, atrial fibrillation on Coumadin, CAD, CABG, chronic systolic CHF, brain aneurysm, aortic stenosis, aortic insufficiency, chronically elevated troponin, presented to ED with confusion, forgetfulness and difficulty speaking. As per daughter, 2 weeks history of episodic confusion and apraxia. Neurology consulted.   Assessment & Plan:   1. Confusion and apraxia: Neurology consultation appreciated. Discussed with Dr. Leonie Man. Suspects underlying  dementia and rule out reversible causes. CT head 1/10: No acute intracranial abnormalities. Unable to do MRI because of ICD. CTA head and neck 1/11: Widespread atherosclerotic vascular disease. 14 x 10 x 10 mm aneurysm at right MCA bifurcation. TSH normal. A1c 6.3. B12 379. On aspirin. Per SLP> MMSE 28/30.   As per neurology input: Patient had recurrent episodes of stereotyped apraxia/neglect. They did not think that this was a TIA. There is a question of thrombus in the left IJ but he is anticoagulated. Seizures since felt a significant possibility but the duration is quite long for seizure. It is also quite long for TIA. 4-calorie of this makes waxing/waning delirium very unlikely. As per neurology, at this point though the diagnosis of seizure is not definite, they do think an antiepileptic trial would be reasonable, though would not aggressively uptitrate without further characterization of the spells. With his improvement, paraneoplastic syndrome also felt less likely. Lacosamide on 100 MG twice a day started, CT chest report as below and outpatient follow-up with neurology.  Mental status significantly improved and as per son, back to baseline. Neurology has seen and cleared for discharge home. 2. Persistent atrial fibrillation: Paced rhythm on telemetry. Anticoagulated on Coumadin. Continue metoprolol. 3. Stage III chronic kidney disease: Baseline creatinine in the 1.7-? 1.9 range. Increased from 1.7-1.93. Received CTA contrast 1/11. Briefly held Bumex. Gentle IV fluids. Creatinine improved to 1.69. Advised patient/son to hold Bumex for one or 2 days, adequate oral hydration and then resume Bumex. Follow-up BMP in a couple days as outpatient. 4. CAD, CABG, chronic troponin elevation: No chest pain reported. Continue aspirin, statins and beta blockers. 5. Hyperlipidemia: Statins. 6. Essential hypertension: Reasonable control. Continue metoprolol. 7. COPD: Stable without bronchospasm. 8. Right MCA  aneurysm: Noted. As discussed  with neurology, outpatient follow-up with IR. 9. Chronic systolic and diastolic CHF: 2-D echo LVEF 45-50 percent. Clinically compensated. Temporarily hold Bumex-please see discussion above.  10. RUL density: Noted on CTA head and neck. CT chest results as below, confirms RUL groundglass nodule and radiology recommends follow-up CT at 6-12 months   DVT prophylaxis: Anticoagulated on Coumadin. Code Status: DO NOT RESUSCITATE. Family Communication: None at bedside today. Disposition: Home with home health PT when medically improved.   Consultants:  Neurology   Procedures:  As above      Discharge Instructions  Discharge Instructions    Ambulatory referral to Neurology   Complete by:  As directed    An appointment is requested in approximately: 6 weeks Follow up with stroke clinic Dr Leonie Man preferred, if not available, then consider [facility] Chestnut, [facility] or Jaynee Eagles whoever is available) at [facility] in about 6-8 weeks. Thanks.   Call MD for:   Complete by:  As directed    Recurrent confusion.   Diet - low sodium heart healthy   Complete by:  As directed    Driving Restrictions   Complete by:  As directed    No driving> patient doesn't drive.   Increase activity slowly   Complete by:  As directed        Medication List    TAKE these medications   aspirin 81 MG EC tablet Take 1 tablet (81 mg total) by mouth daily.   bumetanide 1 MG tablet Commonly known as:  BUMEX Take 2 tablets (2 mg total) by mouth daily. MAY TAKE AN EXTRA TABLET IF NEEDED DEPENDING ON DAILY WEIGHT   cyanocobalamin 1000 MCG tablet Take 1 tablet (1,000 mcg total) by mouth daily. Start taking on:  02/24/2017   Lacosamide 100 MG Tabs Take 1 tablet (100 mg total) by mouth 2 (two) times daily.   levothyroxine 50 MCG tablet Commonly known as:  SYNTHROID, LEVOTHROID Take 1 tablet (50 mcg total) by mouth daily.   metoprolol succinate 25 MG 24 hr tablet Commonly known as:   TOPROL-XL Take 1 tablet (25 mg total) by mouth daily.   omeprazole 20 MG capsule Commonly known as:  PRILOSEC Take 1 capsule (20 mg total) by mouth daily.   potassium chloride 10 MEQ CR capsule Commonly known as:  MICRO-K Take 10 mEq by mouth daily.   simvastatin 20 MG tablet Commonly known as:  ZOCOR TAKE 1 TABLET BY MOUTH ONCE DAILY IN THE EVENING   warfarin 6 MG tablet Commonly known as:  COUMADIN CONTINUE SAME HOME DOSE: Take 1/2 tablet by mouth on Mondays and Fridays, and 1 whole tablet per day on all other days. What changed:    how much to take  how to take this  when to take this  additional instructions      Follow-up Information    Garvin Fila, MD. Schedule an appointment as soon as possible for a visit in 6 week(s).   Specialties:  Neurology, Radiology Contact information: [address] [place] [address] [place], [place], DO. Schedule an appointment as soon as possible for a visit in 3 day(s).   Specialty:  Family Medicine Why:  To be seen with repeat labs (CBC & BMP). Contact information: [place] [address] [phone]        [facility] - Dme Follow up.   Why:  rolling walker and 3 in1 /  BSC will be delivered to bedside prior to discharge Contact information: [place] [address] [phone]        Health, [facility]-Home Follow up.   Specialty:  Home Health Services Contact information: [place] [address] [phone]        Luanne Bras, MD. Schedule an appointment as soon as possible for a visit.   Specialties:  Interventional Radiology, Radiology Why:  Follow up re brain aneurysm. Contact information: [address] [place] [address] [phone]          Allergies  Allergen Reactions  . Nitroglycerin Other (See Comments)    DROPS BLOOD PRESSURE  DRASTICALLY Other reaction(s): Other (See Comments) BP drops      Procedures/Studies: Ct Angio Head W Or Wo Contrast  Result Date: 02/21/2017 CLINICAL DATA:  Right-sided weakness, confusion and aphasia. History brain aneurysm. EXAM: CT ANGIOGRAPHY HEAD AND NECK TECHNIQUE: Multidetector CT imaging of the head and neck was performed using the standard protocol during bolus administration of intravenous contrast. Multiplanar CT image reconstructions and MIPs were obtained to evaluate the vascular anatomy. Carotid stenosis measurements (when applicable) are obtained utilizing NASCET criteria, using the distal internal carotid diameter as the denominator. CONTRAST:  52mL ISOVUE-370 IOPAMIDOL (ISOVUE-370) INJECTION 76% COMPARISON:  CT 02/11/2017 and 02/20/2017 FINDINGS: Low to moderate arterial contrast opacity. CTA NECK FINDINGS Aortic arch: Atherosclerosis of the arch. No aneurysm or dissection. 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 Right carotid system: Common carotid artery shows atherosclerotic plaque but is patent to the bifurcation region. Extensive calcified plaque at the carotid bifurcation and proximal ICA. Minimal diameter is 4.5 mm. Compared to a more distal cervical ICA diameter of the same, there is no stenosis. Left carotid system: Common carotid artery shows plaque but no stenosis. At the bifurcation, there is extensive calcified plaque affecting the ICA bulb. Minimal diameter at the distal bulb is 4.5 mm, this same as the more distal cervical ICA, therefore no stenosis. Vertebral arteries: Vertebral artery origins are poorly seen due to low contrast opacity. Both vertebral arteries do show flow and are approximately equal in size. No stenosis seen in the cervical region. Skeleton: Ordinary cervical spondylosis. Other neck: Venous reflux in the left jugular vein raises  the possibility nonocclusive thrombus. This is not definite. Findings could be flow related. Multiple bilateral thyroid nodules. Upper chest: Abnormal density in the central right upper lobe measuring up to 1.8 cm. This could represent infiltrate or a mass. Recommend complete chest CT when able. Review of the MIP images confirms the above findings CTA HEAD FINDINGS Anterior circulation: Both internal carotid arteries are patent through the skull base and siphon regions. There is typical peripheral atherosclerotic calcification throughout the carotid siphon regions without stenosis greater than 50%. The anterior and middle cerebral vessels show atherosclerotic calcification. No flow limiting stenosis or vessel occlusion is identified. Peripherally calcified right MCA bifurcation aneurysm measuring 14 x 10 x 11 mm. Main branches arise directly from the body of the aneurysm. Posterior circulation: Atherosclerotic disease of both vertebral arteries at the foramen magnum level with narrowing of 30-50%. Calcified plaque affecting the distal left vertebral artery with stenosis of 70%. Atherosclerotic disease affecting the basilar artery. Proximal basilar fenestration. Areas of stenosis in the proximal basilar artery estimated at 30-50%. Superior cerebellar and posterior cerebral arteries show flow. Fetal origin of  the left PCA. Venous sinuses: Patent and normal. Anatomic variants: None other significant. Delayed phase: No abnormal enhancement. Review of the MIP images confirms the above findings IMPRESSION: Widespread atherosclerotic vascular disease affecting all arterial structures. 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 Atherosclerotic calcification at both carotid bifurcation regions. No measurable stenosis. Atherosclerotic calcification in both carotid siphon regions with narrowing estimated at 30-50% on each side. No intracranial flow limiting  stenosis or occlusion of the large or medium size vessels. Distal vessel atherosclerotic change diffusely. 14 x 10 x 11 mm aneurysm at the right MCA bifurcation with major branch vessels arising directly from the aneurysm. Stenoses of the distal vertebral arteries and basilar artery estimated at 30-50%. 70% stenosis of the distal left vertebral artery. Distal vessel atherosclerotic change diffusely. Question nonocclusive thrombus within the left jugular vein. 1.8 cm density in the right upper lobe which is indeterminate. This could be an infiltrate or mass. Complete chest CT suggested when able. Electronically Signed   By: Nelson Chimes M.D.   On: 02/21/2017 11:08   Dg Chest 2 View  Result Date: 02/11/2017 CLINICAL DATA:  Shortness of breath. EXAM: CHEST  2 VIEW COMPARISON:  Chest x-ray dated August 26, 2016. FINDINGS: Unchanged positioning of left chest wall pacer device. Stable cardiomegaly status post CABG. Normal pulmonary vascularity. Mild bibasilar atelectasis. No focal consolidation, pleural effusion, or pneumothorax. No acute osseous abnormality. IMPRESSION: Stable cardiomegaly.  No active cardiopulmonary disease. Electronically Signed   By: Titus Dubin M.D.   On: 02/11/2017 13:11   Ct Head Wo Contrast  Result Date: 02/20/2017 CLINICAL DATA:  Subacute neuro deficit, 3 episodes of confusion in the past week, able to verbalize when he needs to do but unable to perform actions, post unable to turn on television, dress himself or balance Hist check buttock EXAM: CT HEAD WITHOUT CONTRAST TECHNIQUE: Contiguous axial images were obtained from the base of the skull through the vertex without intravenous contrast. Sagittal and coronal MPR images reconstructed from axial data set. COMPARISON:  02/11/2017 FINDINGS: Brain: Asymmetric positioning in gantry. Generalized atrophy. Normal ventricular morphology. No midline shift or mass effect. Small vessel chronic ischemic changes of deep cerebral white matter. No  intracranial hemorrhage, mass lesion or evidence of acute infarction. No extra-axial fluid collections. Vascular: Aneurysmal dilatation of the RIGHT middle cerebral artery 9.4 mm diameter. Extensive atherosclerotic calcifications of internal carotid, middle cerebral, and vertebral arteries at skull base. Skull: Demineralized but intact Sinuses/Orbits: Question mucosal retention cysts in maxillary sinuses. Mobile opacification within the sphenoid sinus question mucous. Mastoid air cells clear. Other: N/A IMPRESSION: Atrophy with small vessel chronic ischemic changes of deep cerebral white matter. No acute intracranial abnormalities. 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 Electronically Signed   By: Lavonia Dana M.D.   On: 02/20/2017 17:01   Ct Head Wo Contrast  Result Date: 02/11/2017 CLINICAL DATA:  Confusion EXAM: CT HEAD WITHOUT CONTRAST TECHNIQUE: Contiguous axial images were obtained from the base of the skull through the vertex without intravenous contrast. COMPARISON:  None. FINDINGS: Brain: Atrophic changes and chronic white matter ischemic changes are noted. No findings to suggest acute hemorrhage, acute infarction or space-occupying mass lesion are noted. Vascular: Prominence of the right middle cerebral artery is noted which may be related to focal aneurysm formation. Skull: Normal. Negative for fracture or focal lesion. Sinuses/Orbits: Mucosal retention cysts are noted within the sphenoid  and maxillary sinuses. Other: None IMPRESSION: Chronic atrophic and ischemic changes. Prominence of the right middle cerebral artery which may be related to a aneurysm formation. Further evaluation is recommended. Electronically Signed   By: Inez Catalina M.D.   On: 02/11/2017 13:24   Ct Angio Neck W Or Wo Contrast  Result Date: 02/21/2017 CLINICAL DATA:  Right-sided weakness, confusion and aphasia. History brain  aneurysm. EXAM: CT ANGIOGRAPHY HEAD AND NECK TECHNIQUE: Multidetector CT imaging of the head and neck was performed using the standard protocol during bolus administration of intravenous contrast. Multiplanar CT image reconstructions and MIPs were obtained to evaluate the vascular anatomy. Carotid stenosis measurements (when applicable) are obtained utilizing NASCET criteria, using the distal internal carotid diameter as the denominator. CONTRAST:  67mL ISOVUE-370 IOPAMIDOL (ISOVUE-370) INJECTION 76% COMPARISON:  CT 02/11/2017 and 02/20/2017 FINDINGS: Low to moderate arterial contrast opacity. CTA NECK FINDINGS Aortic arch: Atherosclerosis of the arch. No aneurysm or dissection. 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 Right carotid system: Common carotid artery shows atherosclerotic plaque but is patent to the bifurcation region. Extensive calcified plaque at the carotid bifurcation and proximal ICA. Minimal diameter is 4.5 mm. Compared to a more distal cervical ICA diameter of the same, there is no stenosis. Left carotid system: Common carotid artery shows plaque but no stenosis. At the bifurcation, there is extensive calcified plaque affecting the ICA bulb. Minimal diameter at the distal bulb is 4.5 mm, this same as the more distal cervical ICA, therefore no stenosis. Vertebral arteries: Vertebral artery origins are poorly seen due to low contrast opacity. Both vertebral arteries do show flow and are approximately equal in size. No stenosis seen in the cervical region. Skeleton: Ordinary cervical spondylosis. Other neck: Venous reflux in the left jugular vein raises the possibility nonocclusive thrombus. This is not definite. Findings could be flow related. Multiple bilateral thyroid nodules. Upper chest: Abnormal density in the central right upper lobe measuring up to 1.8 cm. This could  represent infiltrate or a mass. Recommend complete chest CT when able. Review of the MIP images confirms the above findings CTA HEAD FINDINGS Anterior circulation: Both internal carotid arteries are patent through the skull base and siphon regions. There is typical peripheral atherosclerotic calcification throughout the carotid siphon regions without stenosis greater than 50%. The anterior and middle cerebral vessels show atherosclerotic calcification. No flow limiting stenosis or vessel occlusion is identified. Peripherally calcified right MCA bifurcation aneurysm measuring 14 x 10 x 11 mm. Main branches arise directly from the body of the aneurysm. Posterior circulation: Atherosclerotic disease of both vertebral arteries at the foramen magnum level with narrowing of 30-50%. Calcified plaque affecting the distal left vertebral artery with stenosis of 70%. Atherosclerotic disease affecting the basilar artery. Proximal basilar fenestration. Areas of stenosis in the proximal basilar artery estimated at 30-50%. Superior cerebellar and posterior cerebral arteries show flow. Fetal origin of the left PCA. Venous sinuses: Patent and normal. Anatomic variants: None other significant. Delayed phase: No abnormal enhancement. Review of the MIP images confirms the above findings IMPRESSION: Widespread atherosclerotic vascular disease affecting all arterial structures. 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 Atherosclerotic calcification at both carotid bifurcation regions. No measurable stenosis. Atherosclerotic calcification in both carotid siphon regions with narrowing estimated at 30-50% on each side. No intracranial flow limiting stenosis or occlusion of the large  or medium size vessels. Distal vessel atherosclerotic change diffusely. 14 x 10 x 11 mm aneurysm at the right MCA bifurcation with major branch vessels arising directly from the aneurysm. Stenoses  of the distal vertebral arteries and basilar artery estimated at 30-50%. 70% stenosis of the distal left vertebral artery. Distal vessel atherosclerotic change diffusely. Question nonocclusive thrombus within the left jugular vein. 1.8 cm density in the right upper lobe which is indeterminate. This could be an infiltrate or mass. Complete chest CT suggested when able. Electronically Signed   By: Nelson Chimes M.D.   On: 02/21/2017 11:08   Ct Chest W Contrast  Result Date: 02/23/2017 CLINICAL DATA:  Right upper lobe density on neck CTA. No cardiopulmonary symptoms. EXAM: CT CHEST WITH CONTRAST TECHNIQUE: Multidetector CT imaging of the chest was performed during intravenous contrast administration. CONTRAST:  50mL ISOVUE-300 IOPAMIDOL (ISOVUE-300) INJECTION 61% COMPARISON:  Neck CT of 02/21/2017.  Chest radiograph of 02/11/2017. FINDINGS: Cardiovascular: Advanced aortic and branch vessel atherosclerosis. 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 Moderate cardiomegaly, without pericardial effusion. Pulmonary artery enlargement, outflow tract 3.8 cm. No central pulmonary embolism, on this non-dedicated study. Pacer. Mediastinum/Nodes: Nonspecific hypoattenuating right thyroid nodule of 1.6 cm. Enlarged node within the azygoesophageal recess measures 1.8 cm. No hilar adenopathy. Lungs/Pleura: Small bilateral pleural effusions. Posterior right upper lobe ground-glass nodule measures 2.0 x 2.0 cm on image 44/series 4. Traversing vessels, without convincing evidence of solid components. Bibasilar atelectasis. Upper Abdomen: Lateral segment left liver lobe cyst. Normal imaged portions of the spleen, adrenal glands. Proximal gastric underdistention. Bilateral renal cysts. Musculoskeletal: Moderate thoracic spondylosis with accentuation of expected thoracic kyphosis. IMPRESSION: 1. Right upper lobe ground-glass nodule. Per consensus criteria, this warrants follow-up chest CT at 6-12 months.  This recommendation follows the consensus statement: Guidelines for Management of Small Pulmonary Nodules Detected on CT Images: From the Fleischner Society 2017; Radiology 2017; 284:228-243. 2.  Aortic Atherosclerosis (ICD10-I70.0). 3. Mild mediastinal adenopathy, most likely reactive. Recommend attention on follow-up. 4. Tiny bilateral pleural effusions. 5. Pulmonary artery enlargement suggests pulmonary arterial hypertension. Electronically Signed   By: Abigail Miyamoto M.D.   On: 02/23/2017 11:44      Subjective: Patient denies complaints. "I'm fine". As per son, mental status back to baseline. No confusion, word finding difficulties, pain issues. As per nursing, no acute events noted.  Discharge Exam:  Vitals:   02/23/17 0109 02/23/17 0252 02/23/17 0918 02/23/17 1311  BP: 135/68 (!) 155/83 113/65 123/69  Pulse: (!) 59 95 (!) 59 (!) 59  Resp: 16 17 16 20   Temp: 98 F (36.7 C) 98.1 F (36.7 C) 98.1 F (36.7 C)   TempSrc: Oral Oral Oral   SpO2: 94% 98% 97% 97%  Weight:      Height:        General exam: Elderly male, moderately built and nourished, sitting up comfortably in bed. Respiratory system: Clear to auscultation. Respiratory effort normal.  Cardiovascular system: S1 & S2 heard, RRR. No JVD, murmurs, rubs, gallops or clicks. No pedal edema. Telemetry: V paced rhythm.  Gastrointestinal system: Abdomen is nondistended, soft and nontender. No organomegaly or masses felt. Normal bowel sounds heard. Central nervous system: Alert and oriented 3. No apraxia noted today. No focal neurological deficits. Extremities: Symmetric 5 x 5 power. Skin: No rashes, lesions or ulcers Psychiatry: Judgement and insight impaired. Mood & affect appropriate.       The results of significant diagnostics from this hospitalization (including imaging, microbiology, ancillary and laboratory) are  listed below for reference.      Labs: CBC: Recent Labs  Lab 02/20/17 1530 02/20/17 1544  WBC 4.6   --   NEUTROABS 2.9  --   HGB 12.6* 13.3  HCT 39.8 39.0  MCV 98.0  --   PLT 250  --    Basic Metabolic Panel: Recent Labs  Lab 02/20/17 1530 02/20/17 1544 02/21/17 1121 02/22/17 0248 02/23/17 0744  NA 137 140  --  137 137  K 4.3 4.2  --  3.8 4.1  CL 101 101  --  105 107  CO2 26  --   --  24 20*  GLUCOSE 106* 104*  --  122* 113*  BUN 41* 39*  --  40* 35*  CREATININE 1.78* 1.70*  --  1.93* 1.69*  CALCIUM 10.0  --   --  9.5 9.8  MG  --   --  1.8  --   --   PHOS  --   --  2.5  --   --    Liver Function Tests: Recent Labs  Lab 02/20/17 1530  AST 30  ALT 16*  ALKPHOS 89  BILITOT 1.2  PROT 7.3  ALBUMIN 3.6   BNP (last 3 results) Recent Labs    08/26/16 0811 01/30/17 1208 02/20/17 2310  BNP 636.9* 458.4* 458.2*   Cardiac Enzymes: Recent Labs  Lab 02/20/17 2310 02/21/17 0336 02/21/17 1121  TROPONINI 0.12* 0.14* 0.13*   CBG: No results for input(s): GLUCAP in the last 168 hours. Hgb A1c Recent Labs    02/21/17 0336  HGBA1C 6.3*   Lipid Profile Recent Labs    02/21/17 0336  CHOL 123  HDL 31*  LDLCALC 63  TRIG 143  CHOLHDL 4.0   Thyroid function studies Recent Labs    02/21/17 0336  TSH 2.601   Anemia work up Recent Labs    02/21/17 0336  VITAMINB12 379   Urinalysis    Component Value Date/Time   COLORURINE YELLOW 02/21/2017 0103   APPEARANCEUR CLEAR 02/21/2017 0103   LABSPEC 1.013 02/21/2017 0103   PHURINE 5.0 02/21/2017 0103   GLUCOSEU NEGATIVE 02/21/2017 0103   HGBUR SMALL (A) 02/21/2017 0103   BILIRUBINUR NEGATIVE 02/21/2017 0103   KETONESUR NEGATIVE 02/21/2017 0103   PROTEINUR NEGATIVE 02/21/2017 0103   NITRITE NEGATIVE 02/21/2017 0103   LEUKOCYTESUR NEGATIVE 02/21/2017 0103   Discussed in detail with patient's son. Updated care and answered questions.   Time coordinating discharge: Less than 30 minutes  SIGNED:  Vernell Leep, MD, FACP, [facility]. [facility] Pager [phone]  If 7PM-7AM, please contact  night-coverage [email or web address] Password [email or web address] 02/23/2017, 1:27 PM

## 2017-02-24 ENCOUNTER — Telehealth: Payer: Self-pay

## 2017-02-24 ENCOUNTER — Telehealth: Payer: Self-pay | Admitting: Family Medicine

## 2017-02-24 DIAGNOSIS — E039 Hypothyroidism, unspecified: Secondary | ICD-10-CM | POA: Diagnosis not present

## 2017-02-24 DIAGNOSIS — I4891 Unspecified atrial fibrillation: Secondary | ICD-10-CM | POA: Diagnosis not present

## 2017-02-24 DIAGNOSIS — Z7901 Long term (current) use of anticoagulants: Secondary | ICD-10-CM | POA: Diagnosis not present

## 2017-02-24 DIAGNOSIS — I130 Hypertensive heart and chronic kidney disease with heart failure and stage 1 through stage 4 chronic kidney disease, or unspecified chronic kidney disease: Secondary | ICD-10-CM | POA: Diagnosis not present

## 2017-02-24 DIAGNOSIS — K219 Gastro-esophageal reflux disease without esophagitis: Secondary | ICD-10-CM | POA: Diagnosis not present

## 2017-02-24 DIAGNOSIS — Z951 Presence of aortocoronary bypass graft: Secondary | ICD-10-CM | POA: Diagnosis not present

## 2017-02-24 DIAGNOSIS — N183 Chronic kidney disease, stage 3 (moderate): Secondary | ICD-10-CM | POA: Diagnosis not present

## 2017-02-24 DIAGNOSIS — Z7982 Long term (current) use of aspirin: Secondary | ICD-10-CM | POA: Diagnosis not present

## 2017-02-24 DIAGNOSIS — Z95810 Presence of automatic (implantable) cardiac defibrillator: Secondary | ICD-10-CM | POA: Diagnosis not present

## 2017-02-24 DIAGNOSIS — E785 Hyperlipidemia, unspecified: Secondary | ICD-10-CM | POA: Diagnosis not present

## 2017-02-24 DIAGNOSIS — I2510 Atherosclerotic heart disease of native coronary artery without angina pectoris: Secondary | ICD-10-CM | POA: Diagnosis not present

## 2017-02-24 DIAGNOSIS — J449 Chronic obstructive pulmonary disease, unspecified: Secondary | ICD-10-CM | POA: Diagnosis not present

## 2017-02-24 DIAGNOSIS — Z955 Presence of coronary angioplasty implant and graft: Secondary | ICD-10-CM | POA: Diagnosis not present

## 2017-02-24 DIAGNOSIS — I5042 Chronic combined systolic (congestive) and diastolic (congestive) heart failure: Secondary | ICD-10-CM | POA: Diagnosis not present

## 2017-02-24 LAB — FOLATE RBC
Folate, Hemolysate: 467.8 ng/mL
Folate, RBC: 1268 ng/mL (ref >498)
Hematocrit: 36.9 % — ABNORMAL LOW (ref 37.5–51.0)

## 2017-02-24 MED ORDER — LEVETIRACETAM 500 MG PO TABS: 500.0000 mg | ORAL_TABLET | Freq: Two times a day (BID) | ORAL | 2 refills | Status: DC

## 2017-02-24 NOTE — Telephone Encounter (Signed)
Keppra called in. 500 mg twice daily. We can discuss further at his f/u appt. TY.

## 2017-02-24 NOTE — Telephone Encounter (Signed)
Patient informed of PCP instructions. He had purchased already the expensive ones and will discuss at his office visit

## 2017-02-24 NOTE — Telephone Encounter (Signed)
TCM follow up call made to patient, left message for return call.

## 2017-02-24 NOTE — Telephone Encounter (Signed)
Copied from [place] [id number]. Topic: Quick Communication - See Telephone Encounter >> Feb 24, 2017  9:59 AM Synthia Innocent wrote: CRM for notification. See Telephone encounter for:  Hospital prescribed  Lacosamide 100 MG TABS, will cost $ 500 out of pocket, can something else be called in.  02/24/17.

## 2017-02-24 NOTE — Telephone Encounter (Signed)
Copied from [place] [id number]. Topic: Quick Communication - See Telephone Encounter >> Feb 24, 2017  9:59 AM Synthia Innocent wrote: CRM for notification. See Telephone encounter for:  Hospital prescribed  Lacosamide 100 MG TABS, will cost $ 500 out of pocket, can something else be called in.  02/24/17. >> Feb 24, 2017 10:01 AM Synthia Innocent wrote: Need verbal order for PT, 2x a week for 3 weeks, 1x a week for 6 weeks [place] w/ [facility] [phone]

## 2017-02-24 NOTE — Telephone Encounter (Signed)
02/24/17   TCM Hospital Follow Up  Transition Care Management Follow-up Telephone Call  ADMISSION DATE: 02/20/17  DISCHARGE DATE: 02/23/17  Note: PATIENT TO HAVE CBC AND BMP DRAWN   How have you been since you were released from the hospital?  Feeling ok just weak per patient.  .Do you understand why you were in the hospital? Yes   Do you understand the discharge instrcutions? Yes    Items Reviewed:  Medications reviewed: Yes   Allergies reviewed:NKDA per patient   Dietary changes reviewed:Low Sodium Heart Healthy   Referrals reviewed: Appointment scheduled with Dr. Nani Ravens for 02/27/16   Functional Questionnaire:   Activities of Daily Living (ADLs): Patient can perform all independently except uses walker to assist with ambulation.  Any patient concerns? Patient states he would like to have INR's done in this office from this point on.    States he would like a medication to take the place of LOCOSAMIDE states he cannot afford it. Has not started.   Confirmed importance and date/time of follow-up visits scheduled:Yes   Confirmed with patient if condition begins to worsen call PCP or go to the ER. Yes    Patient was given the office number and encouragred to call back with questions or concerns. Yes

## 2017-02-25 LAB — METHYLMALONIC ACID, SERUM: Methylmalonic Acid, Quantitative: 433 nmol/L — ABNORMAL HIGH (ref 0–378)

## 2017-02-26 ENCOUNTER — Other Ambulatory Visit: Payer: Self-pay | Admitting: Family Medicine

## 2017-02-26 ENCOUNTER — Encounter: Payer: Self-pay | Admitting: Family Medicine

## 2017-02-26 ENCOUNTER — Ambulatory Visit (INDEPENDENT_AMBULATORY_CARE_PROVIDER_SITE_OTHER): Payer: Medicare Other | Admitting: Family Medicine

## 2017-02-26 VITALS — BP 108/64 | HR 70 | Temp 98.2°F | Ht 70.0 in | Wt 196.2 lb

## 2017-02-26 DIAGNOSIS — R410 Disorientation, unspecified: Secondary | ICD-10-CM | POA: Diagnosis not present

## 2017-02-26 DIAGNOSIS — E039 Hypothyroidism, unspecified: Secondary | ICD-10-CM

## 2017-02-26 LAB — BASIC METABOLIC PANEL
BUN: 32 mg/dL — ABNORMAL HIGH (ref 6–23)
CALCIUM: 10.3 mg/dL (ref 8.4–10.5)
CHLORIDE: 104 meq/L (ref 96–112)
CO2: 29 meq/L (ref 19–32)
CREATININE: 1.7 mg/dL — AB (ref 0.40–1.50)
GFR: 40.54 mL/min — ABNORMAL LOW (ref >60.00)
GLUCOSE: 104 mg/dL — AB (ref 70–99)
Potassium: 4.2 mEq/L (ref 3.5–5.1)
Sodium: 139 mEq/L (ref 135–145)

## 2017-02-26 LAB — CBC
HEMATOCRIT: 37.5 % — AB (ref 39.0–52.0)
Hemoglobin: 12.2 g/dL — ABNORMAL LOW (ref 13.0–17.0)
MCHC: 32.6 g/dL (ref 30.0–36.0)
MCV: 96.9 fl (ref 78.0–100.0)
Platelets: 217 10*3/uL (ref 150.0–400.0)
RBC: 3.87 Mil/uL — AB (ref 4.22–5.81)
RDW: 14.6 % (ref 11.5–15.5)
WBC: 5.1 10*3/uL (ref 4.0–10.5)

## 2017-02-26 NOTE — Patient Instructions (Addendum)
Sarina Ill- Neurologist [phone] Appt on 04/23/17, 1:30 PM [place],  [phone]  Call pharmacy about cost of Keppra (seizure medication)  Let us know if anything happens again.

## 2017-02-26 NOTE — Progress Notes (Signed)
Chief Complaint  Patient presents with  . Hospitalization Follow-up    memory loss and confusion    HPI Kenneth Bell is a 84 y.o. y.o. male who presents for a transition of care visit.  Pt was discharged from [facility] on 02/23/17.  Within 48 business hours of discharge our office contacted him via telephone to coordinate his care and needs.    He is present with both of his daughters today.  Due to prolonged periods of confusion, he underwent a stroke workup in the hospital.  This was after he had declined admission before and followed up with me.  I ordered an MRI of his head, however he could not get it because of a defibrillator.  He had another episode and subsequently went to the hospital.  His workup was unremarkable.  He was started on Vimpat with the thought that it could be due to seizures.  This medicine is very expensive and he called our office to see if there is an alternative.  I have called in [place], however the family has yet to pick it up.  He has not had any episodes since discharge.  He is doing well otherwise with the exception of weakness.  He does have home physical therapy set up.  He has an appointment with the neurology team on 3/13.  Social History   Socioeconomic History  . Marital status: Married  Tobacco Use  . Smoking status: Former Smoker    Types: Cigarettes    Last attempt to quit: 05/30/1967    Years since quitting: 49.7  . Smokeless tobacco: Never Used  Substance and Sexual Activity  . Alcohol use: Yes    Alcohol/week: 3.6 oz    Types: 6 Glasses of wine per week  . Drug use: No            Social History Narrative   He has been married for 66 years. They have 5 children and 10 grandchildren. 12 great-grandchildren.   He recently moved to [place], [place] and oriented close to his daughter here. His wife has been moved into an Alzheimer's/memory unit nearby.   He currently lives with his daughter.   He has a college degree and previously worked as a  self-employed Materials engineer.   He himself quit driving couple years ago because he was concerned about his reaction time. He is therefore depend upon his family or public transportation.   He quit smoking in 1969 having smoked for 20 years.   He drinks maybe 4-7 drinks alcohol/wine a week.   He does not exercise any longer because of shortness of breath and lack of energy.   Past Medical History:  Diagnosis Date  . Brain aneurysm   . Chronic combined systolic and diastolic heart failure, NYHA class 3 (HCC)    EF has seemed to vary report-to-report. Myoview 2007 showed EF 30%, echo in 2016 7 EF 60-65% -> Echo in 08/2015 & 08/2016  40-45%  (with basal-mid inferoseptal & apical inferolateral wall), Mod P HTN (PAP ~53 mHg)  . CKD (chronic kidney disease), stage III ([place]) 08/26/2016  . COPD (chronic obstructive pulmonary disease) ([place])   . Coronary artery disease involving native heart with angina pectoris ([place])    a. MI 2002 - PCI to [place]. b. 2005: PCI to LAD, OM1 & OM2.  c. s/p CABG in 2013: LIMA to LAD, SVG to OM2, SVG to PDA and SVG to RI. ; d. Cath 2017: occluded LAD, OM1 & OM2, SVG-RI  with patent LIMA-LAD, SVG-rPDA & SVG-OM2.   . Essential hypertension   . Hyperlipidemia with target LDL less than 70   . Mild aortic insufficiency 08/28/2016   noted on Echo  . 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 Persistent atrial fibrillation ([place])   . Presence of biventricular implantable cardioverter-defibrillator (ICD) 2008  . Thyroid disease    Family History  Problem Relation Age of Onset  . Arthritis Mother   . Heart disease Father    Allergies as of 02/26/2017      Reactions   Nitroglycerin Other (See Comments)   DROPS BLOOD PRESSURE DRASTICALLY Other reaction(s): Other (See Comments) BP drops      Medication List        Accurate as of 02/26/17 12:34 PM. Always use your most recent med list.            aspirin 81 MG EC tablet Take 1 tablet (81 mg total) by mouth daily.   bumetanide 1 MG tablet Commonly known as:  BUMEX Take 2 tablets (2 mg total) by mouth daily. MAY TAKE AN EXTRA TABLET IF NEEDED DEPENDING ON DAILY WEIGHT   cyanocobalamin 1000 MCG tablet Take 1 tablet (1,000 mcg total) by mouth daily.   levETIRAcetam 500 MG tablet Commonly known as:  KEPPRA Take 1 tablet (500 mg total) by mouth 2 (two) times daily.   levothyroxine 50 MCG tablet Commonly known as:  SYNTHROID, LEVOTHROID Take 1 tablet (50 mcg total) by mouth daily.   metoprolol succinate 25 MG 24 hr tablet Commonly known as:  TOPROL-XL Take 1 tablet (25 mg total) by mouth daily.   omeprazole 20 MG capsule Commonly known as:  PRILOSEC Take 1 capsule (20 mg total) by mouth daily.   potassium chloride 10 MEQ CR capsule Commonly known as:  MICRO-K Take 10 mEq by mouth daily.   simvastatin 20 MG tablet Commonly known as:  ZOCOR TAKE 1 TABLET BY MOUTH ONCE DAILY IN THE EVENING   warfarin 6 MG tablet Commonly known as:  COUMADIN CONTINUE SAME HOME DOSE: Take 1/2 tablet by mouth on Mondays and Fridays, and 1 whole tablet per day on all other days.       ROS:  Constitutional: No fevers or chills, no weight loss HEENT: No headaches, hearing loss, or runny nose, no sore throat Heart: No chest pain Lungs: No SOB, no cough Abd: No bowel changes, no pain, no N/V GU: No urinary complaints Neuro: No numbness, tingling or weakness Msk: No joint or muscle pain  Objective BP 108/64 (BP Location: Left Arm, Patient Position: Sitting, Cuff Size: Normal)   Pulse 70   Temp 98.2 F (36.8 C) (Oral)   Ht 5\' 10"  (1.778 m)   Wt 196 lb 4 oz (89 kg)   SpO2 97%   BMI 28.16 kg/m  General Appearance:  awake, alert, oriented, in no acute distress and well developed, well nourished Skin:  there are no suspicious lesions or rashes of concern Head/face:  NCAT Eyes:  EOMI, PERRLA Ears:  canals and TMs  NI Nose/Sinuses:  negative Mouth/Throat:  Mucosa moist, no lesions; pharynx without erythema, edema or exudate. Neck:  neck- supple, no mass, non-tender and no jvd Lungs: Clear to auscultation.  No rales, rhonchi, or wheezing. Normal effort, no accessory muscle use. Heart:  Heart sounds are normal.  Regular rate and rhythm without murmur, gallop or rub. No bruits. Abdomen:  BS+, soft, NT, ND, no masses or organomegaly Musculoskeletal:  No muscle group atrophy or asymmetry, utilizes a walker for ambulation Neurologic:  Alert and oriented x 3, gait normal., reflexes normal and symmetric, strength and  sensation grossly normal Psych exam: Nml mood and affect, age appropriate judgment and insight  Confusion - Plan: Basic metabolic panel, CBC  Discharge summary and medication list have been reviewed/reconciled.  Labs pending at the time of discharge have been reviewed or are still pending at the time of this visit.  Follow-up labs and appointments have been ordered and/or coordinated appropriately. Educational materials regarding the patient's admitting diagnosis provided.  TRANSITIONAL CARE MANAGEMENT CERTIFICATION:  I certify the following are true:   1. Communication with the patient/care giver was made within 2 business days of discharge.  2. Complexity of Medical decision making is moderate.  3. Face to face visit occurred within 14  days of discharge.   Encouraged to keep appt w Neuro team. Info given.  Call the pharmacy to see how much Keppra is.  If it is too expensive, we need to know. F/u prn. The patient voiced understanding and agreement to the plan.  [place], DO 02/26/17 12:34 PM

## 2017-02-26 NOTE — Progress Notes (Signed)
Pre visit review using our clinic review tool, if applicable. No additional management support is needed unless otherwise documented below in the visit note. 

## 2017-02-28 DIAGNOSIS — I4891 Unspecified atrial fibrillation: Secondary | ICD-10-CM | POA: Diagnosis not present

## 2017-02-28 DIAGNOSIS — J449 Chronic obstructive pulmonary disease, unspecified: Secondary | ICD-10-CM | POA: Diagnosis not present

## 2017-02-28 DIAGNOSIS — N183 Chronic kidney disease, stage 3 (moderate): Secondary | ICD-10-CM | POA: Diagnosis not present

## 2017-02-28 DIAGNOSIS — I2510 Atherosclerotic heart disease of native coronary artery without angina pectoris: Secondary | ICD-10-CM | POA: Diagnosis not present

## 2017-02-28 DIAGNOSIS — I5042 Chronic combined systolic (congestive) and diastolic (congestive) heart failure: Secondary | ICD-10-CM | POA: Diagnosis not present

## 2017-02-28 DIAGNOSIS — I130 Hypertensive heart and chronic kidney disease with heart failure and stage 1 through stage 4 chronic kidney disease, or unspecified chronic kidney disease: Secondary | ICD-10-CM | POA: Diagnosis not present

## 2017-03-03 ENCOUNTER — Other Ambulatory Visit: Payer: Self-pay | Admitting: Family Medicine

## 2017-03-03 DIAGNOSIS — I2510 Atherosclerotic heart disease of native coronary artery without angina pectoris: Secondary | ICD-10-CM | POA: Diagnosis not present

## 2017-03-03 DIAGNOSIS — I4891 Unspecified atrial fibrillation: Secondary | ICD-10-CM | POA: Diagnosis not present

## 2017-03-03 DIAGNOSIS — J449 Chronic obstructive pulmonary disease, unspecified: Secondary | ICD-10-CM | POA: Diagnosis not present

## 2017-03-03 DIAGNOSIS — N183 Chronic kidney disease, stage 3 (moderate): Secondary | ICD-10-CM | POA: Diagnosis not present

## 2017-03-03 DIAGNOSIS — I5042 Chronic combined systolic (congestive) and diastolic (congestive) heart failure: Secondary | ICD-10-CM | POA: Diagnosis not present

## 2017-03-03 DIAGNOSIS — I130 Hypertensive heart and chronic kidney disease with heart failure and stage 1 through stage 4 chronic kidney disease, or unspecified chronic kidney disease: Secondary | ICD-10-CM | POA: Diagnosis not present

## 2017-03-03 DIAGNOSIS — D7589 Other specified diseases of blood and blood-forming organs: Secondary | ICD-10-CM

## 2017-03-05 ENCOUNTER — Telehealth: Payer: Self-pay | Admitting: *Deleted

## 2017-03-05 DIAGNOSIS — I5042 Chronic combined systolic (congestive) and diastolic (congestive) heart failure: Secondary | ICD-10-CM | POA: Diagnosis not present

## 2017-03-05 DIAGNOSIS — I130 Hypertensive heart and chronic kidney disease with heart failure and stage 1 through stage 4 chronic kidney disease, or unspecified chronic kidney disease: Secondary | ICD-10-CM | POA: Diagnosis not present

## 2017-03-05 DIAGNOSIS — I4891 Unspecified atrial fibrillation: Secondary | ICD-10-CM | POA: Diagnosis not present

## 2017-03-05 DIAGNOSIS — J449 Chronic obstructive pulmonary disease, unspecified: Secondary | ICD-10-CM | POA: Diagnosis not present

## 2017-03-05 DIAGNOSIS — N183 Chronic kidney disease, stage 3 (moderate): Secondary | ICD-10-CM | POA: Diagnosis not present

## 2017-03-05 DIAGNOSIS — I2510 Atherosclerotic heart disease of native coronary artery without angina pectoris: Secondary | ICD-10-CM | POA: Diagnosis not present

## 2017-03-05 NOTE — Telephone Encounter (Signed)
Received Home Health Certification and Plan of Care; forwarded to provider/SLS 01/23

## 2017-03-10 DIAGNOSIS — I2510 Atherosclerotic heart disease of native coronary artery without angina pectoris: Secondary | ICD-10-CM | POA: Diagnosis not present

## 2017-03-10 DIAGNOSIS — J449 Chronic obstructive pulmonary disease, unspecified: Secondary | ICD-10-CM | POA: Diagnosis not present

## 2017-03-10 DIAGNOSIS — I5042 Chronic combined systolic (congestive) and diastolic (congestive) heart failure: Secondary | ICD-10-CM | POA: Diagnosis not present

## 2017-03-10 DIAGNOSIS — I130 Hypertensive heart and chronic kidney disease with heart failure and stage 1 through stage 4 chronic kidney disease, or unspecified chronic kidney disease: Secondary | ICD-10-CM | POA: Diagnosis not present

## 2017-03-10 DIAGNOSIS — N183 Chronic kidney disease, stage 3 (moderate): Secondary | ICD-10-CM | POA: Diagnosis not present

## 2017-03-10 DIAGNOSIS — I4891 Unspecified atrial fibrillation: Secondary | ICD-10-CM | POA: Diagnosis not present

## 2017-03-12 DIAGNOSIS — J449 Chronic obstructive pulmonary disease, unspecified: Secondary | ICD-10-CM | POA: Diagnosis not present

## 2017-03-12 DIAGNOSIS — I4891 Unspecified atrial fibrillation: Secondary | ICD-10-CM | POA: Diagnosis not present

## 2017-03-12 DIAGNOSIS — N183 Chronic kidney disease, stage 3 (moderate): Secondary | ICD-10-CM | POA: Diagnosis not present

## 2017-03-12 DIAGNOSIS — I2510 Atherosclerotic heart disease of native coronary artery without angina pectoris: Secondary | ICD-10-CM | POA: Diagnosis not present

## 2017-03-12 DIAGNOSIS — I5042 Chronic combined systolic (congestive) and diastolic (congestive) heart failure: Secondary | ICD-10-CM | POA: Diagnosis not present

## 2017-03-12 DIAGNOSIS — I130 Hypertensive heart and chronic kidney disease with heart failure and stage 1 through stage 4 chronic kidney disease, or unspecified chronic kidney disease: Secondary | ICD-10-CM | POA: Diagnosis not present

## 2017-03-19 ENCOUNTER — Other Ambulatory Visit: Payer: Self-pay | Admitting: Family Medicine

## 2017-03-19 MED ORDER — METOPROLOL SUCCINATE ER 25 MG PO TB24: 25.0000 mg | ORAL_TABLET | Freq: Every day | ORAL | 5 refills | Status: DC

## 2017-03-20 ENCOUNTER — Telehealth: Payer: Self-pay | Admitting: Family Medicine

## 2017-03-20 NOTE — Telephone Encounter (Signed)
Patient daughterdropped off paper work. Paper work was placed w/ robin.

## 2017-03-21 ENCOUNTER — Telehealth: Payer: Self-pay | Admitting: *Deleted

## 2017-03-21 DIAGNOSIS — I130 Hypertensive heart and chronic kidney disease with heart failure and stage 1 through stage 4 chronic kidney disease, or unspecified chronic kidney disease: Secondary | ICD-10-CM | POA: Diagnosis not present

## 2017-03-21 DIAGNOSIS — N183 Chronic kidney disease, stage 3 (moderate): Secondary | ICD-10-CM | POA: Diagnosis not present

## 2017-03-21 DIAGNOSIS — I4891 Unspecified atrial fibrillation: Secondary | ICD-10-CM | POA: Diagnosis not present

## 2017-03-21 DIAGNOSIS — I2510 Atherosclerotic heart disease of native coronary artery without angina pectoris: Secondary | ICD-10-CM | POA: Diagnosis not present

## 2017-03-21 DIAGNOSIS — I5042 Chronic combined systolic (congestive) and diastolic (congestive) heart failure: Secondary | ICD-10-CM | POA: Diagnosis not present

## 2017-03-21 DIAGNOSIS — J449 Chronic obstructive pulmonary disease, unspecified: Secondary | ICD-10-CM | POA: Diagnosis not present

## 2017-03-21 NOTE — Telephone Encounter (Signed)
Spoke with pt's daughter, Bonnita Nasuti, informed her that FL2 form has been completed. Informed that patient would need to come in for Nurse Visit to have TB/PPD placement and return w/i 48-72 hours to have it read; scheduled placement for Wed, 03/26/17 at 2:45pm/SLS 02/08

## 2017-03-21 NOTE — Telephone Encounter (Signed)
FL2 paperwork with note attached from daughter completed as much as possible; forwarded to provider/SLS 02/08

## 2017-03-24 LAB — CUP PACEART INCLINIC DEVICE CHECK
MDC IDC PG IMPLANT DT: 20150918
MDC IDC PG SERIAL: 103634
MDC IDC SESS DTM: 20190211171139

## 2017-03-26 ENCOUNTER — Ambulatory Visit: Payer: Medicare Other

## 2017-03-26 DIAGNOSIS — I2510 Atherosclerotic heart disease of native coronary artery without angina pectoris: Secondary | ICD-10-CM | POA: Diagnosis not present

## 2017-03-26 DIAGNOSIS — I130 Hypertensive heart and chronic kidney disease with heart failure and stage 1 through stage 4 chronic kidney disease, or unspecified chronic kidney disease: Secondary | ICD-10-CM | POA: Diagnosis not present

## 2017-03-26 DIAGNOSIS — I5042 Chronic combined systolic (congestive) and diastolic (congestive) heart failure: Secondary | ICD-10-CM | POA: Diagnosis not present

## 2017-03-26 DIAGNOSIS — N183 Chronic kidney disease, stage 3 (moderate): Secondary | ICD-10-CM | POA: Diagnosis not present

## 2017-03-26 DIAGNOSIS — I4891 Unspecified atrial fibrillation: Secondary | ICD-10-CM | POA: Diagnosis not present

## 2017-03-26 DIAGNOSIS — J449 Chronic obstructive pulmonary disease, unspecified: Secondary | ICD-10-CM | POA: Diagnosis not present

## 2017-03-26 DIAGNOSIS — Z111 Encounter for screening for respiratory tuberculosis: Secondary | ICD-10-CM

## 2017-03-26 NOTE — Progress Notes (Signed)
Pt came in and today to have a PPD placed. Placed on his Left forearm and is set to come in Friday AFTER 1500. Pt states understanding and compliance. I explained that it if he does not make it in on Friday it will have to be redone. Pt states understanding.

## 2017-03-28 ENCOUNTER — Ambulatory Visit: Payer: Medicare Other

## 2017-03-28 ENCOUNTER — Encounter: Payer: Self-pay | Admitting: Cardiology

## 2017-03-28 DIAGNOSIS — Z111 Encounter for screening for respiratory tuberculosis: Secondary | ICD-10-CM

## 2017-03-28 LAB — TB SKIN TEST
INDURATION: 0 mm
TB SKIN TEST: NEGATIVE

## 2017-03-28 NOTE — Addendum Note (Signed)
Addended by: Roma Kayser on: 03/28/2017 03:54 PM   Modules accepted: Orders

## 2017-03-28 NOTE — Progress Notes (Signed)
Pt came in today to have his PPD placement from 03/26/2017 reda today. PPD is negative and paperwork dropped off has all been filled out and returned to Pt. If he should have any further questions or concerns he's been assured we're here to help in any and every way we can.

## 2017-03-30 ENCOUNTER — Other Ambulatory Visit: Payer: Self-pay | Admitting: Physician Assistant

## 2017-03-30 DIAGNOSIS — I480 Paroxysmal atrial fibrillation: Secondary | ICD-10-CM

## 2017-03-31 DIAGNOSIS — J449 Chronic obstructive pulmonary disease, unspecified: Secondary | ICD-10-CM | POA: Diagnosis not present

## 2017-03-31 DIAGNOSIS — I2510 Atherosclerotic heart disease of native coronary artery without angina pectoris: Secondary | ICD-10-CM | POA: Diagnosis not present

## 2017-03-31 DIAGNOSIS — N183 Chronic kidney disease, stage 3 (moderate): Secondary | ICD-10-CM | POA: Diagnosis not present

## 2017-03-31 DIAGNOSIS — I130 Hypertensive heart and chronic kidney disease with heart failure and stage 1 through stage 4 chronic kidney disease, or unspecified chronic kidney disease: Secondary | ICD-10-CM | POA: Diagnosis not present

## 2017-03-31 DIAGNOSIS — I5042 Chronic combined systolic (congestive) and diastolic (congestive) heart failure: Secondary | ICD-10-CM | POA: Diagnosis not present

## 2017-03-31 DIAGNOSIS — I4891 Unspecified atrial fibrillation: Secondary | ICD-10-CM | POA: Diagnosis not present

## 2017-04-02 ENCOUNTER — Other Ambulatory Visit: Payer: Self-pay | Admitting: Family Medicine

## 2017-04-02 DIAGNOSIS — I480 Paroxysmal atrial fibrillation: Secondary | ICD-10-CM

## 2017-04-02 NOTE — Telephone Encounter (Signed)
Does not look like you prescribe this

## 2017-04-04 ENCOUNTER — Telehealth: Payer: Self-pay | Admitting: *Deleted

## 2017-04-04 NOTE — Telephone Encounter (Signed)
Received FL2 Addendum with request for: Fill in forms and review Bumetanide order parameters on weights and if pt should do daily and for how long; Order to self-administer medications, completed as much as possible; forwarded to provider with OV notes Rehab notes, Imaging results, Immunization doe TB [PPD]/SLS 02/22

## 2017-04-09 ENCOUNTER — Ambulatory Visit (INDEPENDENT_AMBULATORY_CARE_PROVIDER_SITE_OTHER): Payer: Medicare Other | Admitting: Pharmacist Clinician (PhC)/ Clinical Pharmacy Specialist

## 2017-04-09 ENCOUNTER — Encounter: Payer: Self-pay | Admitting: Cardiology

## 2017-04-09 ENCOUNTER — Ambulatory Visit (INDEPENDENT_AMBULATORY_CARE_PROVIDER_SITE_OTHER): Payer: Medicare Other | Admitting: Cardiology

## 2017-04-09 VITALS — BP 126/74 | HR 60 | Ht 70.0 in | Wt 201.8 lb

## 2017-04-09 DIAGNOSIS — I4819 Other persistent atrial fibrillation: Secondary | ICD-10-CM

## 2017-04-09 DIAGNOSIS — I481 Persistent atrial fibrillation: Secondary | ICD-10-CM

## 2017-04-09 DIAGNOSIS — I2721 Secondary pulmonary arterial hypertension: Secondary | ICD-10-CM | POA: Diagnosis not present

## 2017-04-09 DIAGNOSIS — I25709 Atherosclerosis of coronary artery bypass graft(s), unspecified, with unspecified angina pectoris: Secondary | ICD-10-CM | POA: Diagnosis not present

## 2017-04-09 DIAGNOSIS — I5042 Chronic combined systolic (congestive) and diastolic (congestive) heart failure: Secondary | ICD-10-CM | POA: Diagnosis not present

## 2017-04-09 DIAGNOSIS — Z7901 Long term (current) use of anticoagulants: Secondary | ICD-10-CM

## 2017-04-09 DIAGNOSIS — I10 Essential (primary) hypertension: Secondary | ICD-10-CM

## 2017-04-09 DIAGNOSIS — I350 Nonrheumatic aortic (valve) stenosis: Secondary | ICD-10-CM

## 2017-04-09 LAB — POCT INR: INR: 2

## 2017-04-09 NOTE — Patient Instructions (Signed)
MEDICATION INSTRUCTION  MAY TAKE METOLAZONE 2.5 MG  TOMORROW BEFORE BUMEX.   NO OTHER CHANGES.    Your physician wants you to follow-up in [place].You will receive a reminder letter in the mail two months in advance. If you don't receive a letter, please call our office to schedule the follow-up appointment.   If you need a refill on your cardiac medications before your next appointment, please call your pharmacy.

## 2017-04-09 NOTE — Progress Notes (Signed)
PCP: [name], DO  Clinic Note: Chief Complaint  Patient presents with  . Follow-up    Pt states he has been doing well   . Coronary Artery Disease  . Congestive Heart Failure    Chronic combined systolic and diastolic (mostly diastolic with pulmonary pretension)  . Atrial Fibrillation    HPI: Kenneth Bell is a 84 y.o. male with a PMH below who presents again for 65-month follow-up for CAD, ischemic cardiomyopathy with chronic combined heart failure and recent diagnosis of atrial fibrillation. -Appears to be compensated functional class I-II.  Euvolemic.  No angina.  Rate controlled A. Fib.  He is a former patient of [place] [facility], [place] [facility].  I initially saw him back in September 13, 2016 for hospital follow-up when he was admitted for heart failure exacerbated by A. fib in July 2018 (actually seen by Dr. Debara Pickett and Dr. Sallyanne Kuster in the hospital) he had been diuresed -with IV Lasix. .. He is a long history of CAD  MI 2002  -while living in [place]  PCI to LAD, OM1 and OM 2 in 2005; had catheterization in 2008. PCI in 2011.  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 Sallyanne Kuster here.  S/p CABG 4 in May 2013:(LIMA to LAD, SVG to OM2, SVG to PDA and SVG to RI).  Most recent Myoview in March 2017: Demonstrated prior infarct and lateral wall without inducible ischemia. EF 31% with moderate global HK.  Last cardiac cath was in May 2017:  ? Left main patent. Diffuse LAD disease with competitive flow and distal LAD from LIMA. Mid Cx 35% with occlusion of OM 1 and 90% dCx. OM 2 occluded (perfuse via SVG). Ostial RCA 70%, dRCA 100%.  ? Patent LIMA-LAD. Widely patent SVG-OM, SVG-PDA: 40% proximal. 100% proximal SVG-RI  Echo July 2017: Moderate LV dysfunction with global HK. Possibly worse in the anterolateral and inferolateral/apical segments. EF 40-45%.  Chronic  Combined Systolic and Diastolic CHF  --> baseline weight is usually 191 lb (on home scale)  has not been on ACE inhibitor or ARB secondary to hypotension and chronic kidney disease.  On standing dose Bumex with Sliding Scale  PAF  (appears to be more persistent)-> on warfarin and Toprol; clearly this exacerbates his heart failure when he goes in [facility].  Has history of apparently pleural effusion status post thoracentesis.  Kenneth Bell was last seen on January 30, 2017 confused because of increased weight up to 200 pounds from his usual dry weight of 191 pounds at home.  He did notice some swelling and mildly increased exertional dyspnea.  But did not have a good response to Bumex at that time.  I added Zaroxolyn as needed.  Recent Hospitalizations: Since last visit -in 2019  Jan 1 Confusion (unable to put on underwear).  Jan 10-13 - Apraxia --follow-up from initial hospitalization episodic confusion and apraxia.  Was thought to be related to underlying dementia.  Evidence of widespread atherosclerotic disease on CTA of the head.He was started on antiseizure medications and ended up being switched over to [place] because of cost issues.   -- since then has had 1-2 short spells  Studies Personally Reviewed - (if available, images/films reviewed: From Epic Chart or Care Everywhere)  Transthoracic Echo February 21, 2017: EF 45-50% (similar to last).  Apical-inferolateral akinesis.  Severe focal basal and moderate concentric LVH.  Mild aortic  stenosis.  Elevated PA pressures with peak pressures estimated 59 mmHg (moderate PA hypertension)  Interval History: Kenneth Bell returns today overall feeling pretty much back to normal.  He has had 2 little episodes of confusion since his last hospitalization but doing better.  He is a little bit again confused about his weight stating that home is 193 pounds which is close to his baseline, but here to 201.  He says that he has been a little bit "and already  "eating sweets and chocolate bars etc.  He thinks he may have actually gained real weight. He has not had to use metolazone all that much since I last saw him, but is staying on a standing dose of Bumex.  He is taking it 2 tabs twice a day now.  But is not always taking them much.  He says he still has a little bit of decreased energy, but thinks that this is natural aging now.  He has some exertional dyspnea but no chest tightness or pressure.  The dyspnea is actually better now that the edema is better.  Pretty much doing well -- now only doing standing Bumex dose (wgt had gone down to 189 @ home, but now mostly 192-194 Lb).,  Remainder of cardiac review of symptoms: No rapid irregular heartbeats or palpitations.  No syncope/near syncope or TIA/amaurosis fugax.  Does not really walk enough to have claudication.   ROS: A comprehensive was performed.  Pertinent symptoms noted above Review of Systems  Constitutional: Positive for malaise/fatigue (Tires easily). Negative for weight loss.  HENT: Positive for hearing loss. Negative for congestion and sore throat.   Respiratory: Positive for shortness of breath (Improved). Negative for cough and wheezing.   Cardiovascular: Positive for leg swelling (Off and on depending on volume status).  Gastrointestinal: Negative for abdominal pain, constipation and heartburn.  Genitourinary: Negative for dysuria and hematuria.  Musculoskeletal: Positive for joint pain (Bilateral knee pain seems to limit his walking).  Neurological: Positive for dizziness (If he stands up too quickly).  Endo/Heme/Allergies: Bruises/bleeds easily.  Psychiatric/Behavioral: Negative for depression and memory loss (Not obviously). The patient is not nervous/anxious.   All other systems reviewed and are negative.  I have reviewed and (if needed) personally updated the patient's problem list, medications, allergies, past medical and surgical history, social and family history.    Past Medical History:  Diagnosis Date  . Brain aneurysm   . Chronic combined systolic and diastolic heart failure, NYHA class 3 (HCC)    EF has seemed to vary report-to-report. Myoview 2007 showed EF 30%, echo in 2016 7 EF 60-65% -> Echo in 08/2015 & 08/2016  40-45%  (with basal-mid inferoseptal & apical inferolateral wall), Mod P HTN (PAP ~53 mHg) --most recent in January 2019 says EF 45-50%.  . CKD (chronic kidney disease), stage III ([place]) 08/26/2016  . COPD (chronic obstructive pulmonary disease) ([place])   . Coronary artery disease involving native heart with angina pectoris ([place])    a. MI 2002 - PCI to [place]. b. 2005: PCI to LAD, OM1 & OM2.  c. s/p CABG in 2013: LIMA to LAD, SVG to OM2, SVG to PDA and SVG to RI. ; d. Cath 2017: occluded LAD, OM1 & OM2, SVG-RI with patent LIMA-LAD, SVG-rPDA & SVG-OM2.   . Essential hypertension   . Hyperlipidemia with target LDL less than 70   . Mild aortic stenosis 08/28/2016   Stenosis with mild regurgitation.  . Moderate pulmonary arterial systolic hypertension ([place]) 08/28/2016   PA  pressures F has been estimated at 53-59 mmHg on echo-   . Persistent atrial fibrillation ([place])   . Presence of biventricular implantable cardioverter-defibrillator (ICD) 2008  . Thyroid disease     Past Surgical History:  Procedure Laterality Date  . APPENDECTOMY    . CARDIAC CATHETERIZATION  06/2015   Left main patent. Diffuse LAD disease with competitive flow and distal LAD from LIMA. Mid Cx 35% with occlusion of OM 1 and 90% dCx. OM 2 occluded (perfuse via SVG). Ostial RCA 70%, dRCA 100%.  Patent LIMA-LAD. Widely patent SVG-OM, SVG-PDA: 40% proximal. 100% proximal SVG-RI  . CARDIAC SURGERY    . CARPAL TUNNEL RELEASE Right 2017  . CORONARY ANGIOPLASTY WITH STENT PLACEMENT     Prior PCI to OM 2; 2005 PCI to LAD and OM1 with a Taxus DES 2.5 x 16 mm (postdilated to 3.8 mm) and PTCA of OM 2 stent  . CORONARY ARTERY BYPASS GRAFT  2013   LIMA-LAD, SVG-L1-2, SVG-PDA, SVG-RI  (known occlusion of SVG RI)  . KNEE SURGERY Bilateral 1999  . NM MYOVIEW LTD  04/2015   Prior lateral infarct with no scanning. EF 31% with moderate global HK  . SHOULDER SURGERY Bilateral    Twice  . TRANSTHORACIC ECHOCARDIOGRAM  08/2015   Moderate LV dysfunction with global HK (estimated at 40 and 45%). Worse anterolateral and inferolateral wall.  . TRANSTHORACIC ECHOCARDIOGRAM  08/2016   EF 40-45%. Moderate concentric LVH. Akinesis of basal-mid inferoseptal and apical inferolateral wall.  Aortic sclerosis, no stenosis. Moderate mitral thickening with mild MR. Moderate RV dilation. Moderate TR and PR. PA pressures 53 mmHg  . TRANSTHORACIC ECHOCARDIOGRAM  02/2017    EF 45-50% (similar to last).  Apical-inferolateral akinesis.  Severe focal basal and moderate concentric LVH.  Mild aortic stenosis.  Elevated PA pressures with peak pressures estimated 59 mmHg (moderate PA hypertension)  . VEIN BYPASS SURGERY  2013    Current Meds  Medication Sig  . aspirin EC 81 MG EC tablet Take 1 tablet (81 mg total) by mouth daily.  . bumetanide (BUMEX) 1 MG tablet Take 2 tablets (2 mg total) by mouth daily. MAY TAKE AN EXTRA TABLET IF NEEDED DEPENDING ON DAILY WEIGHT  . cyanocobalamin 1000 MCG tablet Take 1 tablet (1,000 mcg total) by mouth daily.  Marland Kitchen levETIRAcetam (KEPPRA) 500 MG tablet Take 1 tablet (500 mg total) by mouth 2 (two) times daily.  Marland Kitchen levothyroxine (SYNTHROID, LEVOTHROID) 50 MCG tablet TAKE 1 TABLET BY MOUTH ONCE DAILY  . metolazone (ZAROXOLYN) 2.5 MG tablet Take 2.5 mg by mouth as needed. TAKE 30 MIN BEFORE  BUMEX MORNING DOSE IF NEEDED  . metoprolol succinate (TOPROL-XL) 25 MG 24 hr tablet Take 1 tablet (25 mg total) by mouth daily.  Marland Kitchen omeprazole (PRILOSEC) 20 MG capsule Take 1 capsule (20 mg total) by mouth daily.  . potassium chloride (MICRO-K) 10 MEQ CR capsule Take 10 mEq by mouth daily.  . simvastatin (ZOCOR) 20 MG tablet TAKE 1 TABLET BY MOUTH ONCE DAILY IN THE EVENING  . warfarin  (COUMADIN) 6 MG tablet CONTINUE SAME HOME DOSE: Take 1/2 tablet by mouth on Mondays and Fridays, and 1 whole tablet per day on all other days.  Marland Kitchen warfarin (COUMADIN) 6 MG tablet TAKE 1 TABLET BY MOUTH ONCE DAILY AT  6  PM  EXCEPT  ON  MONDAYS  AND  FRIDAYS  ONLY  TAKE  ONE-HALF  TABLET    Allergies  Allergen Reactions  . Lipitor  [Atorvastatin]   .  Nitroglycerin Other (See Comments)    DROPS BLOOD PRESSURE DRASTICALLY Other reaction(s): Other (See Comments) BP drops   Social History   Tobacco Use  . Smoking status: Former Smoker    Types: Cigarettes    Last attempt to quit: 05/30/1967    Years since quitting: 49.9  . Smokeless tobacco: Never Used  Substance Use Topics  . Alcohol use: Yes    Alcohol/week: 3.6 oz    Types: 6 Glasses of wine per week  . Drug use: No   Social History   Social History Narrative   He has been married for 7 years. They have 5 children and 10 grandchildren. 12 great-grandchildren.   He recently moved to [facility] [place] [facility], [place] and oriented close to his daughter here. His wife has been moved into an Alzheimer's/memory unit nearby.   He currently lives with his daughter.   He has a college degree and previously worked as a self-employed Materials engineer.   He himself quit driving couple years ago because he was concerned about his reaction time. He is therefore depend upon his family or public transportation.   He quit smoking in 1969 having smoked for 20 years.   He drinks maybe 4-7 drinks alcohol/wine a week.   He does not exercise any longer because of shortness of breath and lack of energy.   family history includes Arthritis in his mother; Heart disease in his father.  Wt Readings from Last 3 Encounters:  04/09/17 201 lb 12.8 oz (91.5 kg)  02/26/17 196 lb 4 oz (89 kg)  02/20/17 194 lb (88 kg)    PHYSICAL EXAM BP 126/74 (BP Location: Right Arm, Patient Position: Sitting, Cuff Size: Normal)   Pulse 60   Ht 5\' 10"  (1.778 m)   Wt 201 lb 12.8 oz  (91.5 kg)   SpO2 94%   BMI 28.96 kg/m   Physical Exam  Constitutional: He is oriented to person, place, and time. He appears well-developed and well-nourished. No distress.  Well-groomed.  Healthy-appearing.  HENT:  Head: Normocephalic and atraumatic.  Eyes: EOM are normal. Pupils are equal, round, and reactive to light. No scleral icterus.  Neck: Hepatojugular reflux and JVD (8-10 + cm of water.) present. Carotid bruit is not present (Radiated murmur).  Cardiovascular: Normal rate, regular rhythm and normal pulses.  No extrasystoles are present. PMI is not displaced. Exam reveals no gallop.  Murmur heard.  Low-pitched harsh crescendo-decrescendo early systolic murmur is present with a grade of 2/6 at the upper right sternal border radiating to the neck. Pulmonary/Chest: Effort normal. No respiratory distress. He has no wheezes (Mild late expiratory). He has no rales (Mild interstitial sounds/crackles but no overt rhonchi or rales.). He exhibits no tenderness.  No real rales, but he does have some interstitial sounds.  Abdominal: Soft. Bowel sounds are normal. He exhibits no distension. There is no tenderness. There is no rebound.  Musculoskeletal: Normal range of motion. He exhibits edema (Closer to 2 + bilateral lower extremity).  Neurological: He is alert and oriented to person, place, and time.  Skin: Skin is warm and dry. No rash noted. No erythema.  Mild venous stasis changes  Psychiatric: He has a normal mood and affect. His behavior is normal. Judgment and thought content normal.  Nursing note and vitals reviewed.   Adult ECG Report Not checked  Other studies Reviewed: Additional studies/ records that were reviewed today include:  Recent Labs: Checked today for baseline Lab Results  Component Value Date   CREATININE  1.70 (H) 02/26/2017   BUN 32 (H) 02/26/2017   NA 139 02/26/2017   K 4.2 02/26/2017   CL 104 02/26/2017   CO2 29 02/26/2017   Lab Results  Component Value  Date   CHOL 123 02/21/2017   HDL 31 (L) 02/21/2017   LDLCALC 63 02/21/2017   TRIG 143 02/21/2017   CHOLHDL 4.0 02/21/2017      ASSESSMENT / PLAN: Problem List Items Addressed This Visit    Aortic stenosis, mild (Chronic)    I doubt very seriously that the aortic stenosis will get to the point of having any issues in his lifetime.      Relevant Medications   metolazone (ZAROXOLYN) 2.5 MG tablet   Chronic combined systolic and diastolic heart failure, NYHA class 3 (HCC) (Chronic)    Is mobility is definitely limited partly because of age.  Still has issues with volume overload.  I simply encouraged him to use his metolazone again for weight gain.  I do not think he realized that his weight was quite as high as it was now.  It may very well be because of his dietary indiscretion, but I want to try to get back to his baseline weight.      Relevant Medications   metolazone (ZAROXOLYN) 2.5 MG tablet   Coronary artery disease involving coronary bypass graft of native heart with angina pectoris ([place]) - Primary (Chronic)    Doing well no active angina symptoms.  He remains on aspirin, beta-blocker and statin. He has dyspnea but no symptoms.  Since he did improve with diuresis, we will hold off on Myoview.  He actually does not seem to be all that interested in that much aggressive evaluation at this time.      Relevant Medications   metolazone (ZAROXOLYN) 2.5 MG tablet   Essential hypertension (Chronic)    Blood pressure looks good today on low-dose Toprol.      Relevant Medications   metolazone (ZAROXOLYN) 2.5 MG tablet   Moderate pulmonary arterial systolic hypertension (HCC) (Chronic)    This is probably secondary to pulmonary venous hypertension from left ventricular dysfunction.  Treatment options are diuresis and afterload reduction as well as rate control.  Currently his blood pressure is not to the point where we need to do any afterload reduction (especially in order to avoid  orthostasis)      Relevant Medications   metolazone (ZAROXOLYN) 2.5 MG tablet   Persistent atrial fibrillation (HCC); CHA2DS2Vasc = ~4 (CHF, MI/aortic plaque, Age 89) (Chronic)    Not a major issue for him.  He has a pacemaker for backup with standing dose of beta-blocker.  He is on warfarin for anticoagulation without any issues.  At this point I do not think he is interested in rhythm control attempts i.e. considering cardioversion) or changing to a DOAC.      Relevant Medications   metolazone (ZAROXOLYN) 2.5 MG tablet      Current medicines are reviewed at length with the patient today. (+/- concerns) is it okay to take melatonin for sleep as opposed to Tylenol PM.  Can you explain sliding scale Bumex The following changes have been made:See below  Patient Instructions  MEDICATION INSTRUCTION  MAY TAKE METOLAZONE 2.5 MG  [place].   NO OTHER CHANGES.    Your physician wants you to follow-up in [place].You will receive a reminder letter in the mail two months in advance. If you don't receive a letter, please call our  office to schedule the follow-up appointment.   If you need a refill on your cardiac medications before your next appointment, please call your pharmacy.    Studies Ordered:   No orders of the defined types were placed in this encounter.     Glenetta Hew, M.D., M.S. Interventional Cardiologist   Pager # [phone] Phone # [phone] [address] [place], [place] [address]

## 2017-04-11 ENCOUNTER — Encounter: Payer: Self-pay | Admitting: Cardiology

## 2017-04-11 NOTE — Assessment & Plan Note (Signed)
Is mobility is definitely limited partly because of age.  Still has issues with volume overload.  I simply encouraged him to use his metolazone again for weight gain.  I do not think he realized that his weight was quite as high as it was now.  It may very well be because of his dietary indiscretion, but I want to try to get back to his baseline weight.

## 2017-04-11 NOTE — Assessment & Plan Note (Signed)
Doing well no active angina symptoms.  He remains on aspirin, beta-blocker and statin. He has dyspnea but no symptoms.  Since he did improve with diuresis, we will hold off on Myoview.  He actually does not seem to be all that interested in that much aggressive evaluation at this time.

## 2017-04-11 NOTE — Assessment & Plan Note (Signed)
Blood pressure looks good today on low-dose Toprol.

## 2017-04-11 NOTE — Assessment & Plan Note (Signed)
This is probably secondary to pulmonary venous hypertension from left ventricular dysfunction.  Treatment options are diuresis and afterload reduction as well as rate control.  Currently his blood pressure is not to the point where we need to do any afterload reduction (especially in order to avoid orthostasis)

## 2017-04-11 NOTE — Assessment & Plan Note (Addendum)
Not a major issue for him.  He has a pacemaker for backup with standing dose of beta-blocker.  He is on warfarin for anticoagulation without any issues.  At this point I do not think he is interested in rhythm control attempts i.e. considering cardioversion) or changing to a DOAC.

## 2017-04-11 NOTE — Assessment & Plan Note (Signed)
I doubt very seriously that the aortic stenosis will get to the point of having any issues in his lifetime.

## 2017-04-14 DIAGNOSIS — I130 Hypertensive heart and chronic kidney disease with heart failure and stage 1 through stage 4 chronic kidney disease, or unspecified chronic kidney disease: Secondary | ICD-10-CM | POA: Diagnosis not present

## 2017-04-14 DIAGNOSIS — J449 Chronic obstructive pulmonary disease, unspecified: Secondary | ICD-10-CM | POA: Diagnosis not present

## 2017-04-14 DIAGNOSIS — I4891 Unspecified atrial fibrillation: Secondary | ICD-10-CM | POA: Diagnosis not present

## 2017-04-14 DIAGNOSIS — I5042 Chronic combined systolic (congestive) and diastolic (congestive) heart failure: Secondary | ICD-10-CM | POA: Diagnosis not present

## 2017-04-14 DIAGNOSIS — I2510 Atherosclerotic heart disease of native coronary artery without angina pectoris: Secondary | ICD-10-CM | POA: Diagnosis not present

## 2017-04-14 DIAGNOSIS — N183 Chronic kidney disease, stage 3 (moderate): Secondary | ICD-10-CM | POA: Diagnosis not present

## 2017-04-16 ENCOUNTER — Telehealth: Payer: Self-pay | Admitting: Family Medicine

## 2017-04-16 ENCOUNTER — Other Ambulatory Visit: Payer: Medicare Other

## 2017-04-16 ENCOUNTER — Telehealth: Payer: Self-pay | Admitting: Cardiology

## 2017-04-16 ENCOUNTER — Other Ambulatory Visit (INDEPENDENT_AMBULATORY_CARE_PROVIDER_SITE_OTHER): Payer: Medicare Other

## 2017-04-16 ENCOUNTER — Encounter: Payer: Medicare Other | Admitting: *Deleted

## 2017-04-16 DIAGNOSIS — D7589 Other specified diseases of blood and blood-forming organs: Secondary | ICD-10-CM | POA: Diagnosis not present

## 2017-04-16 LAB — CBC
HCT: 37.1 % — ABNORMAL LOW (ref 39.0–52.0)
HEMOGLOBIN: 12 g/dL — AB (ref 13.0–17.0)
MCHC: 32.3 g/dL (ref 30.0–36.0)
MCV: 95.6 fl (ref 78.0–100.0)
Platelets: 225 10*3/uL (ref 150.0–400.0)
RBC: 3.88 Mil/uL — AB (ref 4.22–5.81)
RDW: 15.3 % (ref 11.5–15.5)
WBC: 4.6 10*3/uL (ref 4.0–10.5)

## 2017-04-16 NOTE — Telephone Encounter (Signed)
Copied from [place] [id number]. Topic: Quick Communication - See Telephone Encounter >> Apr 16, 2017  8:56 AM Aurelio Brash B wrote: CRM for notification. See Telephone encounter for:  Marcille Blanco from [facility] called to get the results of the pts recent TB test.  Her contact number is [phone] 04/16/17.

## 2017-04-16 NOTE — Telephone Encounter (Signed)
Spoke with pt and reminded pt of remote transmission that is due today. Pt verbalized understanding.   

## 2017-04-16 NOTE — Telephone Encounter (Signed)
Spoke with patient. We will continue him on Keppra until he can be seen by Neurology team. I did encourage him to keep appointment for now, it is not emergent/urgent at this time. If the Neurology team deems he no longer needs their services, all the better. I also informed him of his recent lab results. All questions answered.

## 2017-04-16 NOTE — Telephone Encounter (Signed)
Results were negative/faxed to Kenneth Bell at [phone]

## 2017-04-16 NOTE — Telephone Encounter (Signed)
Copied from [place] [id number]. Topic: Quick Communication - See Telephone Encounter >> Apr 16, 2017 11:38 AM Rosalin Hawking wrote: CRM for notification. See Telephone encounter for:  04/16/17.   Pt came in office to have lab work done, pt stated also would like to speak with provider about his situation from his last visit, pt mentioned had some memory issues and that was recommended by provider to go to the hospital (for his memory lost issue). Pt went to the hospital and was there for 3 days where he had all type of test done and nothing was found (additional to that the pt got a 1500.00 charge from the hospital) Pt also states that was recommended also to see a neurologist and that the neurologist saw him and gave him rx for a pill that cost him $925.00 a month which pt can not afford. Pt would like to cancel his appt with neurologist (pt already called neurologist office and they recommended pt to speak with provider first) Pt does not want to go to same neurologist since did not get any help from them. Pt would like provider to call him to explain the situation. Please advise.

## 2017-04-17 ENCOUNTER — Telehealth: Payer: Self-pay | Admitting: *Deleted

## 2017-04-17 ENCOUNTER — Encounter: Payer: Self-pay | Admitting: Cardiology

## 2017-04-17 NOTE — Telephone Encounter (Signed)
Received Assisted Living Plan of Care; forwarded to provider with attached OV notes/SLS 03/07 Received 4 pages of Addendum to [facility];  forwarded to provider with attached OV notes/SLS 03/07

## 2017-04-21 DIAGNOSIS — I5042 Chronic combined systolic (congestive) and diastolic (congestive) heart failure: Secondary | ICD-10-CM | POA: Diagnosis not present

## 2017-04-21 DIAGNOSIS — J449 Chronic obstructive pulmonary disease, unspecified: Secondary | ICD-10-CM | POA: Diagnosis not present

## 2017-04-21 DIAGNOSIS — N183 Chronic kidney disease, stage 3 (moderate): Secondary | ICD-10-CM | POA: Diagnosis not present

## 2017-04-21 DIAGNOSIS — I2510 Atherosclerotic heart disease of native coronary artery without angina pectoris: Secondary | ICD-10-CM | POA: Diagnosis not present

## 2017-04-21 DIAGNOSIS — I4891 Unspecified atrial fibrillation: Secondary | ICD-10-CM | POA: Diagnosis not present

## 2017-04-21 DIAGNOSIS — I130 Hypertensive heart and chronic kidney disease with heart failure and stage 1 through stage 4 chronic kidney disease, or unspecified chronic kidney disease: Secondary | ICD-10-CM | POA: Diagnosis not present

## 2017-04-23 ENCOUNTER — Telehealth: Payer: Self-pay | Admitting: Cardiology

## 2017-04-23 ENCOUNTER — Ambulatory Visit: Payer: Self-pay | Admitting: Neurology

## 2017-04-23 NOTE — Telephone Encounter (Signed)
New Message    1. Has your device fired? no  2. Is you device beeping? No   3. Are you experiencing draining or swelling at device site? No   4. Are you calling to see if we received your device transmission? no 5. Have you passed out? No  Patient is calling in reference to letter he received about his transmission not being received. He states that he had a bad internet connection and that the transmitter is not working. Please call to discuss.    Please route to [place]

## 2017-04-24 NOTE — Telephone Encounter (Signed)
Gave number for tech services for further assistance with home monitor.

## 2017-04-28 DIAGNOSIS — Z85828 Personal history of other malignant neoplasm of skin: Secondary | ICD-10-CM | POA: Diagnosis not present

## 2017-04-28 DIAGNOSIS — L570 Actinic keratosis: Secondary | ICD-10-CM | POA: Diagnosis not present

## 2017-04-28 DIAGNOSIS — X32XXXD Exposure to sunlight, subsequent encounter: Secondary | ICD-10-CM | POA: Diagnosis not present

## 2017-04-28 DIAGNOSIS — L820 Inflamed seborrheic keratosis: Secondary | ICD-10-CM | POA: Diagnosis not present

## 2017-04-28 DIAGNOSIS — Z08 Encounter for follow-up examination after completed treatment for malignant neoplasm: Secondary | ICD-10-CM | POA: Diagnosis not present

## 2017-04-30 ENCOUNTER — Telehealth: Payer: Self-pay | Admitting: Cardiology

## 2017-04-30 NOTE — Telephone Encounter (Signed)
Patient calling,   1. Has your device fired? no  2. Is you device beeping? no  3. Are you experiencing draining or swelling at device site? no  4. Are you calling to see if we received your device transmission? yes  5. Have you passed out? No      Please route to [place]

## 2017-04-30 NOTE — Telephone Encounter (Signed)
No transmission received, pt curious if monitor was working, he set it up yesterday. [employer] website lists patient as "monitored"- monitor communicating with clinic. Kenneth Bell appreciative.

## 2017-05-02 ENCOUNTER — Telehealth: Payer: Self-pay | Admitting: Family Medicine

## 2017-05-02 NOTE — Telephone Encounter (Signed)
Copied from [place] [id number]. Topic: Quick Communication - See Telephone Encounter >> May 02, 2017  3:12 PM Burnis Medin, NT wrote: CRM for notification. See Telephone encounter for: 05/02/17. Kenneth Bell is calling because she have faxed over several request for a new plan of care for patient and need the doctor's signature on the med list and new plan of care. Pls sign and return back. Another fax is going to be sent today

## 2017-05-05 ENCOUNTER — Other Ambulatory Visit: Payer: Self-pay | Admitting: Family Medicine

## 2017-05-05 NOTE — Telephone Encounter (Addendum)
Are they suppose to be newer than these x2 wks ago? I will keep an eye out for new paperwork/SLS 03/25  Conversation  (Newest Message First)   [facility]  to Me      04/17/17 11:32 AM  Attn: Radene Ou, LPN   Me    04/14/17 62:22 AM  Note    Received Assisted Living Plan of Care; forwarded to provider with attached OV notes/SLS 03/07 Received 4 pages of Addendum to [facility];  forwarded to provider with attached OV notes/SLS 03/07

## 2017-05-06 NOTE — Telephone Encounter (Deleted)
Spoke with son, Juanda Crumble on 05/05/17, about not receiving paperwork yet and to ask if he could fax them over again. Mr. Victory stated he would resend via [phone]. I have received the forms this morning and will work on completing them today/SLS 03/26

## 2017-05-07 ENCOUNTER — Ambulatory Visit (INDEPENDENT_AMBULATORY_CARE_PROVIDER_SITE_OTHER): Payer: Medicare Other | Admitting: Pharmacist Clinician (PhC)/ Clinical Pharmacy Specialist

## 2017-05-07 DIAGNOSIS — I4819 Other persistent atrial fibrillation: Secondary | ICD-10-CM

## 2017-05-07 DIAGNOSIS — I481 Persistent atrial fibrillation: Secondary | ICD-10-CM | POA: Diagnosis not present

## 2017-05-07 DIAGNOSIS — Z7901 Long term (current) use of anticoagulants: Secondary | ICD-10-CM

## 2017-05-07 LAB — POCT INR: INR: 2.6

## 2017-05-07 NOTE — Patient Instructions (Signed)
Description   Continue with 1 tablet daily except 1/2 tablet each Monday and Friday.  Repeat INR in 4 weeks

## 2017-05-08 ENCOUNTER — Encounter: Payer: Self-pay | Admitting: Family Medicine

## 2017-05-08 ENCOUNTER — Telehealth: Payer: Self-pay | Admitting: *Deleted

## 2017-05-08 ENCOUNTER — Ambulatory Visit (INDEPENDENT_AMBULATORY_CARE_PROVIDER_SITE_OTHER): Payer: Medicare Other | Admitting: Family Medicine

## 2017-05-08 VITALS — BP 124/72 | HR 61 | Temp 98.4°F | Ht 70.0 in | Wt 204.0 lb

## 2017-05-08 DIAGNOSIS — R600 Localized edema: Secondary | ICD-10-CM

## 2017-05-08 DIAGNOSIS — R197 Diarrhea, unspecified: Secondary | ICD-10-CM | POA: Diagnosis not present

## 2017-05-08 LAB — COMPREHENSIVE METABOLIC PANEL
ALBUMIN: 3.4 g/dL — AB (ref 3.5–5.2)
ALT: 13 U/L (ref 0–53)
AST: 20 U/L (ref 0–37)
Alkaline Phosphatase: 90 U/L (ref 39–117)
BUN: 31 mg/dL — AB (ref 6–23)
CHLORIDE: 104 meq/L (ref 96–112)
CO2: 28 meq/L (ref 19–32)
CREATININE: 1.86 mg/dL — AB (ref 0.40–1.50)
Calcium: 9.4 mg/dL (ref 8.4–10.5)
GFR: 36.53 mL/min — ABNORMAL LOW (ref >60.00)
Glucose, Bld: 121 mg/dL — ABNORMAL HIGH (ref 70–99)
Potassium: 3.5 mEq/L (ref 3.5–5.1)
SODIUM: 139 meq/L (ref 135–145)
Total Bilirubin: 0.6 mg/dL (ref 0.2–1.2)
Total Protein: 7 g/dL (ref 6.0–8.3)

## 2017-05-08 LAB — CBC
HCT: 32.9 % — ABNORMAL LOW (ref 39.0–52.0)
Hemoglobin: 11 g/dL — ABNORMAL LOW (ref 13.0–17.0)
MCHC: 33.4 g/dL (ref 30.0–36.0)
MCV: 95.3 fl (ref 78.0–100.0)
Platelets: 218 10*3/uL (ref 150.0–400.0)
RBC: 3.46 Mil/uL — AB (ref 4.22–5.81)
RDW: 15.3 % (ref 11.5–15.5)
WBC: 3.9 10*3/uL — ABNORMAL LOW (ref 4.0–10.5)

## 2017-05-08 NOTE — Progress Notes (Signed)
Chief Complaint  Patient presents with  . Diarrhea  . Edema    Kenneth Bell is 84 y.o. male here for complaint of diarrhea.  Duration: 2 weeks Abdominal pain? Yes Bleeding? No Recent travel? No Recent antibiotic use? No Sick contacts? Yes - people w diarrhea at [facility]  Fevers? No  ROS:  GI: As noted in HPI Const: Denies fevers  Past Medical History:  Diagnosis Date  . Brain aneurysm   . Chronic combined systolic and diastolic heart failure, NYHA class 3 (HCC)    EF has seemed to vary report-to-report. Myoview 2007 showed EF 30%, echo in 2016 7 EF 60-65% -> Echo in 08/2015 & 08/2016  40-45%  (with basal-mid inferoseptal & apical inferolateral wall), Mod P HTN (PAP ~53 mHg) --most recent in January 2019 says EF 45-50%.  . CKD (chronic kidney disease), stage III ([place]) 08/26/2016  . COPD (chronic obstructive pulmonary disease) ([place])   . Coronary artery disease involving native heart with angina pectoris ([place])    a. MI 2002 - PCI to [place]. b. 2005: PCI to LAD, OM1 & OM2.  c. s/p CABG in 2013: LIMA to LAD, SVG to OM2, SVG to PDA and SVG to RI. ; d. Cath 2017: occluded LAD, OM1 & OM2, SVG-RI with patent LIMA-LAD, SVG-rPDA & SVG-OM2.   . Essential hypertension   . Hyperlipidemia with target LDL less than 70   . Mild aortic stenosis 08/28/2016   Stenosis with mild regurgitation.  . Moderate pulmonary arterial systolic hypertension (HCC) 08/28/2016   PA pressures F has been estimated at 53-59 mmHg on echo-   . Persistent atrial fibrillation ([place])   . Presence of biventricular implantable cardioverter-defibrillator (ICD) 2008  . Thyroid disease     BP 124/72 (BP Location: Left Arm, Patient Position: Sitting, Cuff Size: Normal)   Pulse 61   Temp 98.4 F (36.9 C) (Oral)   Ht 5\' 10"  (1.778 m)   Wt 204 lb (92.5 kg)   SpO2 96%   BMI 29.27 kg/m  Gen: awake, alert, appearing stated age HEENT: MMM Heart: RRR, no LE edema Lungs: CTAB, no accessory muscle use Abd: BS+, soft, +diffuse  pressure, no pain reported w/ palpation, non-distended, no masses or organomegaly Psych: Age appropriate judgment and insight  Diarrhea, unspecified type - Plan: CBC, Comprehensive metabolic panel, Stool Culture, Ova and parasite examination  Lower extremity edema - Plan: Comprehensive metabolic panel  Orders as above. Ck labs, ck stool culture given duration. If no improvement and cx neg, will consider referral to GI and celiac studies.  Take Metolazone given increased swelling and slight weight gain.  Encouraged adequate hydration with water and electrolyte replacement. F/u prn. The patient voiced understanding and agreement to the plan.  [place], DO 05/08/17 1:47 PM

## 2017-05-08 NOTE — Patient Instructions (Addendum)
Things to look out for: fevers, worsening pain, nausea/vomiting, bleeding.  Keep pushing fluids with electrolytes such as Gatorade or Powerade.  Let us know if you need anything.

## 2017-05-08 NOTE — Progress Notes (Signed)
Pre visit review using our clinic review tool, if applicable. No additional management support is needed unless otherwise documented below in the visit note. 

## 2017-05-08 NOTE — Telephone Encounter (Signed)
Received Physician Orders/Plan of Care from [facility]; forwarded to provider/SLS 03/28

## 2017-05-09 ENCOUNTER — Other Ambulatory Visit: Payer: Medicare Other

## 2017-05-09 ENCOUNTER — Other Ambulatory Visit (INDEPENDENT_AMBULATORY_CARE_PROVIDER_SITE_OTHER): Payer: Medicare Other

## 2017-05-09 ENCOUNTER — Other Ambulatory Visit: Payer: Self-pay | Admitting: Family Medicine

## 2017-05-09 DIAGNOSIS — E860 Dehydration: Secondary | ICD-10-CM

## 2017-05-09 DIAGNOSIS — R197 Diarrhea, unspecified: Secondary | ICD-10-CM

## 2017-05-09 DIAGNOSIS — D649 Anemia, unspecified: Secondary | ICD-10-CM | POA: Diagnosis not present

## 2017-05-09 LAB — IBC PANEL
Iron: 52 ug/dL (ref 42–165)
SATURATION RATIOS: 15.3 % — AB (ref 20.0–50.0)
Transferrin: 242 mg/dL (ref 212.0–360.0)

## 2017-05-13 LAB — STOOL CULTURE
MICRO NUMBER: 90394115
MICRO NUMBER: 90394116
MICRO NUMBER:: 90394118
SHIGA RESULT: NOT DETECTED
SPECIMEN QUALITY: ADEQUATE
SPECIMEN QUALITY:: ADEQUATE
SPECIMEN QUALITY:: ADEQUATE

## 2017-05-13 LAB — OVA AND PARASITE EXAMINATION
CONCENTRATE RESULT: NONE SEEN
MICRO NUMBER:: 90394117
SPECIMEN QUALITY: ADEQUATE
TRICHROME RESULT: NONE SEEN

## 2017-05-15 ENCOUNTER — Ambulatory Visit (INDEPENDENT_AMBULATORY_CARE_PROVIDER_SITE_OTHER): Payer: Medicare Other | Admitting: *Deleted

## 2017-05-15 DIAGNOSIS — I255 Ischemic cardiomyopathy: Secondary | ICD-10-CM | POA: Diagnosis not present

## 2017-05-15 NOTE — Progress Notes (Signed)
Remote ICD transmission.   

## 2017-05-16 NOTE — Telephone Encounter (Signed)
Me    05/08/17 9:23 AM  Note    Received Physician Orders/Plan of Care from [facility]; forwarded to provider/SLS 03/28

## 2017-05-21 ENCOUNTER — Other Ambulatory Visit: Payer: Self-pay | Admitting: Family Medicine

## 2017-05-21 ENCOUNTER — Encounter: Payer: Self-pay | Admitting: Cardiology

## 2017-05-21 MED ORDER — POTASSIUM CHLORIDE ER 10 MEQ PO CPCR: 10.0000 meq | ORAL_CAPSULE | Freq: Every day | ORAL | 5 refills | Status: DC

## 2017-05-21 NOTE — Telephone Encounter (Signed)
Patient called and advised there are refills of Metoprolol at the pharmacy. He will need to call his pharmacy for a refill. I advised the potassium was filled by another provider and the request is being sent to Dr. Nani Ravens for a new prescription. Patient verbalized understanding.

## 2017-05-21 NOTE — Telephone Encounter (Signed)
KDur-last filled by another provider  Last OV: 02/26/17 (meds not addressed) PCP: [place]: [place], [place] [phone] (Phone) [phone] (Fax)

## 2017-05-21 NOTE — Progress Notes (Signed)
OK 

## 2017-05-21 NOTE — Telephone Encounter (Signed)
Copied from [place] [id number]. Topic: Quick Communication - Rx Refill/Question >> May 21, 2017  2:52 PM Sallee Provencal, [place] B, NT wrote: Medication: metoprolol succinate (TOPROL-XL) 25 MG 24 hr tablet & potassium chloride (MICRO-K) 10 MEQ CR capsule Has the patient contacted their pharmacy? Yes.   (Agent: If no, request that the patient contact the pharmacy for the refill.) Preferred Pharmacy (with phone number or street name): [place], [place]: Please be advised that RX refills may take up to 3 business days. We ask that you follow-up with your pharmacy.

## 2017-05-21 NOTE — Telephone Encounter (Signed)
You have not filled before ok to do.

## 2017-05-22 ENCOUNTER — Ambulatory Visit: Payer: Medicare Other | Admitting: Family Medicine

## 2017-05-22 ENCOUNTER — Other Ambulatory Visit: Payer: Self-pay

## 2017-05-22 ENCOUNTER — Other Ambulatory Visit (INDEPENDENT_AMBULATORY_CARE_PROVIDER_SITE_OTHER): Payer: Medicare Other

## 2017-05-22 DIAGNOSIS — E860 Dehydration: Secondary | ICD-10-CM

## 2017-05-22 DIAGNOSIS — E876 Hypokalemia: Secondary | ICD-10-CM

## 2017-05-22 DIAGNOSIS — R197 Diarrhea, unspecified: Secondary | ICD-10-CM | POA: Diagnosis not present

## 2017-05-22 LAB — COMPREHENSIVE METABOLIC PANEL
ALBUMIN: 3.8 g/dL (ref 3.5–5.2)
ALT: 10 U/L (ref 0–53)
AST: 20 U/L (ref 0–37)
Alkaline Phosphatase: 100 U/L (ref 39–117)
BUN: 56 mg/dL — AB (ref 6–23)
CHLORIDE: 92 meq/L — AB (ref 96–112)
CO2: 33 meq/L — AB (ref 19–32)
Calcium: 9.7 mg/dL (ref 8.4–10.5)
Creatinine, Ser: 2.03 mg/dL — ABNORMAL HIGH (ref 0.40–1.50)
GFR: 33.02 mL/min — ABNORMAL LOW (ref >60.00)
Glucose, Bld: 108 mg/dL — ABNORMAL HIGH (ref 70–99)
POTASSIUM: 3 meq/L — AB (ref 3.5–5.1)
SODIUM: 133 meq/L — AB (ref 135–145)
Total Bilirubin: 0.7 mg/dL (ref 0.2–1.2)
Total Protein: 7.8 g/dL (ref 6.0–8.3)

## 2017-05-26 ENCOUNTER — Other Ambulatory Visit: Payer: Self-pay | Admitting: Family Medicine

## 2017-05-26 ENCOUNTER — Other Ambulatory Visit (INDEPENDENT_AMBULATORY_CARE_PROVIDER_SITE_OTHER): Payer: Medicare Other

## 2017-05-26 DIAGNOSIS — E876 Hypokalemia: Secondary | ICD-10-CM

## 2017-05-26 LAB — BASIC METABOLIC PANEL
BUN: 57 mg/dL — ABNORMAL HIGH (ref 6–23)
CALCIUM: 9.9 mg/dL (ref 8.4–10.5)
CO2: 33 mEq/L — ABNORMAL HIGH (ref 19–32)
Chloride: 90 mEq/L — ABNORMAL LOW (ref 96–112)
Creatinine, Ser: 1.98 mg/dL — ABNORMAL HIGH (ref 0.40–1.50)
GFR: 33.98 mL/min — AB (ref >60.00)
Glucose, Bld: 128 mg/dL — ABNORMAL HIGH (ref 70–99)
POTASSIUM: 3.4 meq/L — AB (ref 3.5–5.1)
SODIUM: 132 meq/L — AB (ref 135–145)

## 2017-05-27 ENCOUNTER — Telehealth: Payer: Self-pay | Admitting: Family Medicine

## 2017-05-27 ENCOUNTER — Other Ambulatory Visit: Payer: Self-pay | Admitting: Family Medicine

## 2017-05-27 DIAGNOSIS — E876 Hypokalemia: Secondary | ICD-10-CM

## 2017-05-27 NOTE — Telephone Encounter (Signed)
Instructions were given to pt per Dr Nani Ravens note to increase  His  Potassium  To 2  Tabs  3 times  A  Day as  His  Potassium is still a little   Low and  He  Need  To have  Non fasting lab  In 2  Days  -  Attempted  To  Schedule  An  Appointment for the  Blood  Draw ; Patient states he  Wants to talk to his  Daughter Nigel Sloop  First to arrange  Transportation before scheduling the visit.

## 2017-05-27 NOTE — Telephone Encounter (Addendum)
Attempted to contact pt regarding labs and instructions per Dr Nani Ravens;  Pt's daughter, Nigel Sloop, given results per Dr Nani Ravens, " please let pt know that his potassium is still a little low. He should take 2 tabs of his potassium 3 times per day and let's recheck in 2 d. Please order BMP, dx hypokalemia and schedule this nonfasting lab. TY."; she would like for the pt to give instructions and make appointment for labs.; left message on voice mail [phone].

## 2017-05-28 ENCOUNTER — Telehealth: Payer: Self-pay | Admitting: Family Medicine

## 2017-05-28 NOTE — Telephone Encounter (Signed)
Spoke with Yancey Flemings at [facility]; she clarifies potassium order tiwth Dr Nani Ravens; Per Dr Nani Ravens the pt should take 2 potassium tablets twice daily and schedule a lab apointment on Monday; will attempt to contact pt and make him aware; contacted pt and made him aware; the pt verbalizes  Understanding.

## 2017-05-28 NOTE — Telephone Encounter (Signed)
Pt called for clarification of instructions per Dr Nani Ravens; no one in office at present; will call the pt back when office is open.

## 2017-06-02 ENCOUNTER — Other Ambulatory Visit: Payer: Medicare Other

## 2017-06-02 ENCOUNTER — Other Ambulatory Visit (INDEPENDENT_AMBULATORY_CARE_PROVIDER_SITE_OTHER): Payer: Medicare Other

## 2017-06-02 DIAGNOSIS — E876 Hypokalemia: Secondary | ICD-10-CM

## 2017-06-02 LAB — BASIC METABOLIC PANEL
BUN: 38 mg/dL — AB (ref 6–23)
CO2: 27 meq/L (ref 19–32)
CREATININE: 1.86 mg/dL — AB (ref 0.40–1.50)
Calcium: 9.5 mg/dL (ref 8.4–10.5)
Chloride: 100 mEq/L (ref 96–112)
GFR: 36.52 mL/min — ABNORMAL LOW (ref >60.00)
Glucose, Bld: 111 mg/dL — ABNORMAL HIGH (ref 70–99)
POTASSIUM: 4.3 meq/L (ref 3.5–5.1)
Sodium: 137 mEq/L (ref 135–145)

## 2017-06-03 LAB — CUP PACEART REMOTE DEVICE CHECK
Date Time Interrogation Session: 20190423132359
MDC IDC PG IMPLANT DT: 20150918
Pulse Gen Serial Number: 103634

## 2017-06-04 ENCOUNTER — Other Ambulatory Visit: Payer: Self-pay | Admitting: Family Medicine

## 2017-06-04 DIAGNOSIS — E876 Hypokalemia: Secondary | ICD-10-CM

## 2017-06-10 ENCOUNTER — Ambulatory Visit (INDEPENDENT_AMBULATORY_CARE_PROVIDER_SITE_OTHER): Payer: Medicare Other | Admitting: Pharmacist

## 2017-06-10 DIAGNOSIS — I481 Persistent atrial fibrillation: Secondary | ICD-10-CM

## 2017-06-10 DIAGNOSIS — Z7901 Long term (current) use of anticoagulants: Secondary | ICD-10-CM

## 2017-06-10 DIAGNOSIS — I4819 Other persistent atrial fibrillation: Secondary | ICD-10-CM

## 2017-06-10 LAB — POCT INR: INR: 2.1

## 2017-06-12 ENCOUNTER — Telehealth: Payer: Self-pay | Admitting: Cardiology

## 2017-06-12 DIAGNOSIS — J449 Chronic obstructive pulmonary disease, unspecified: Secondary | ICD-10-CM

## 2017-06-12 DIAGNOSIS — R5383 Other fatigue: Secondary | ICD-10-CM

## 2017-06-12 MED ORDER — METOLAZONE 2.5 MG PO TABS: 2.5000 mg | ORAL_TABLET | ORAL | 6 refills | Status: DC | PRN

## 2017-06-12 NOTE — Telephone Encounter (Signed)
Kenneth Bell is calling because ins won't pay for the prescription that was sent for previous medication.   Pharmacy need to know if Dr Ellyn Hack will ok for pt to get new prescription because  Insurance will pay for hctz and chlorothiazide and indapamide and furosemide.  Please call [place] and she will explain this to you.

## 2017-06-12 NOTE — Telephone Encounter (Signed)
Per pharmacists insurance has changed formulary and Metolazone is going to cost pt $71.00  Insurance  Will pay for HCTZ, chlorothiazide, indapamide,and Lasix.Will forward to Dr harding for review .Adonis Housekeeper

## 2017-06-12 NOTE — Telephone Encounter (Signed)
Clearly the insurance company does not understand what metolazone is used for.  None of these other options do what metolazone does.  I really would prefer not to use indapamide. I want to just use the metolazone on an as-needed basis. --Otherwise we just need to use more Bumex.  Glenetta Hew, MD

## 2017-06-12 NOTE — Telephone Encounter (Signed)
This is a medicine that is probably not using every day.  It should be used as needed.  There is not really another medication option to do with this 1 does.  The plan was to use this 30 minutes before the Bumex for weight gain purposes. The question is how many days AC actually taking it now? --If it is only as needed probably do not need to change to different medication just have it on hand.  Glenetta Hew, MD

## 2017-06-12 NOTE — Telephone Encounter (Signed)
°*  STAT* If patient is at the pharmacy, call can be transferred to refill team.   1. Which medications need to be refilled? (please list name of each medication and dose if known) Metolazone 2.5mg   2. Which pharmacy/location (including street and city if local pharmacy) is medication to be sent to?[facility]  3. Do they need a 30 day or 90 day supply? 4  Having been sending fax since 06-02-17 and no response, Insurance will pay for hctz and chlorothiazide and indapamide and furosemide

## 2017-06-25 ENCOUNTER — Telehealth: Payer: Self-pay | Admitting: Family Medicine

## 2017-06-25 NOTE — Telephone Encounter (Signed)
OK 

## 2017-06-25 NOTE — Telephone Encounter (Signed)
Called [facility] left detailed message ok per PCP

## 2017-06-25 NOTE — Telephone Encounter (Signed)
Copied from [place] [id number]. Topic: General - Other >> Jun 25, 2017  8:03 AM Carolyn Stare wrote:  Pt call to say he would like to go to [facility]  he said they make him feel better.He would like a call back    >> Jun 25, 2017  8:23 AM Lennox Solders wrote: Angie from [facility] is calling to give md the fax number [phone] if he approves or request for pt to have physical therapy for weakness

## 2017-06-25 NOTE — Telephone Encounter (Signed)
Copied from [place] [id number]. Topic: General - Other >> Jun 25, 2017  8:03 AM Carolyn Stare wrote:  Pt call to say he would like to go to [facility]  he said they make him feel better.He would like a call back    >> Jun 25, 2017  8:23 AM Lennox Solders wrote: [facility] from [facility] is calling to give md the fax number [phone] if he approves or request for pt to have physical therapy for weakness  >> Jun 25, 2017 12:41 PM Ahmed Prima L wrote: Angie, PT [facility] called and said that Dr Nani Ravens approved the orders for physical therapy. She wants to know did he fax the prescription for that? She said she doesn't know the fax number because she is driving but should be on file Referral entered

## 2017-06-25 NOTE — Telephone Encounter (Signed)
Copied from [name] [id number]. Topic: General - Other >> Jun 25, 2017  8:03 AM Carolyn Stare wrote:  Pt call to say he would like to go to [facility]  he said they make him feel better.He would like a call back    >> Jun 25, 2017  8:23 AM Lennox Solders wrote: [facility]ie from [facility] is calling to give md the fax number [phone] if he approves or request for pt to have physical therapy for weakness  >> Jun 25, 2017 12:41 PM Ahmed Prima L wrote: Angie, PT from [facility] called and said that Dr Nani Ravens approved the orders for physical therapy. She wants to know did he fax the prescription for that? She said she doesn't know the fax number because she is driving but should be on file

## 2017-06-30 ENCOUNTER — Telehealth: Payer: Self-pay | Admitting: Family Medicine

## 2017-06-30 ENCOUNTER — Other Ambulatory Visit: Payer: Self-pay | Admitting: Family Medicine

## 2017-06-30 DIAGNOSIS — R197 Diarrhea, unspecified: Secondary | ICD-10-CM

## 2017-06-30 DIAGNOSIS — R531 Weakness: Secondary | ICD-10-CM

## 2017-06-30 NOTE — Telephone Encounter (Signed)
Patient advised referral has been entered for him to see GI, also to come by office and pick up container for C diff test. He will come by tomorrow to pick up.

## 2017-06-30 NOTE — Telephone Encounter (Signed)
Angie PT with [facility] (Other) [phone]   Calling to check on order for PT to continue for pt. She states she spoke with someone last week and has not received orders. Please fax to [phone].

## 2017-06-30 NOTE — Telephone Encounter (Signed)
Called [facility] informed referral done today correctly--should be able to see in Epic Left detailed message

## 2017-06-30 NOTE — Telephone Encounter (Signed)
Kenneth Bell I reordered this referral/if you can cancel the referral from 06/25/17 I would appreciate it thanks so much

## 2017-06-30 NOTE — Telephone Encounter (Signed)
Sorry he is not feeling better. I have placed GI referral. I would recommend that he also complete another test called c diff testing. He can pick up container at our lab.  I would also recommend that he follow back up with Dr. Nani Ravens. Go to the ER if abdominal pain, fever, severe diarrhea or black/bloody stools.

## 2017-06-30 NOTE — Telephone Encounter (Signed)
Copied from [place] [id number]. Topic: Referral - Request >> Jun 27, 2017  9:47 AM Marin Olp L wrote: Reason for CRM: Patient would like to be referred to GI and is upset that he still has not gotten a call back about having diarrhea for over a month. Please call patient to notify once referral is placed.

## 2017-07-01 ENCOUNTER — Encounter: Payer: Self-pay | Admitting: Gastroenterology

## 2017-07-01 DIAGNOSIS — R972 Elevated prostate specific antigen [PSA]: Secondary | ICD-10-CM | POA: Diagnosis not present

## 2017-07-01 DIAGNOSIS — N403 Nodular prostate with lower urinary tract symptoms: Secondary | ICD-10-CM | POA: Diagnosis not present

## 2017-07-01 DIAGNOSIS — R350 Frequency of micturition: Secondary | ICD-10-CM | POA: Diagnosis not present

## 2017-07-03 ENCOUNTER — Other Ambulatory Visit: Payer: Medicare Other

## 2017-07-03 ENCOUNTER — Telehealth: Payer: Self-pay | Admitting: Family Medicine

## 2017-07-03 DIAGNOSIS — N183 Chronic kidney disease, stage 3 (moderate): Secondary | ICD-10-CM | POA: Diagnosis not present

## 2017-07-03 DIAGNOSIS — I5042 Chronic combined systolic (congestive) and diastolic (congestive) heart failure: Secondary | ICD-10-CM | POA: Diagnosis not present

## 2017-07-03 DIAGNOSIS — Z8673 Personal history of transient ischemic attack (TIA), and cerebral infarction without residual deficits: Secondary | ICD-10-CM | POA: Diagnosis not present

## 2017-07-03 DIAGNOSIS — I130 Hypertensive heart and chronic kidney disease with heart failure and stage 1 through stage 4 chronic kidney disease, or unspecified chronic kidney disease: Secondary | ICD-10-CM | POA: Diagnosis not present

## 2017-07-03 DIAGNOSIS — I4891 Unspecified atrial fibrillation: Secondary | ICD-10-CM | POA: Diagnosis not present

## 2017-07-03 DIAGNOSIS — Z7901 Long term (current) use of anticoagulants: Secondary | ICD-10-CM | POA: Diagnosis not present

## 2017-07-03 DIAGNOSIS — Z95810 Presence of automatic (implantable) cardiac defibrillator: Secondary | ICD-10-CM | POA: Diagnosis not present

## 2017-07-03 DIAGNOSIS — I272 Pulmonary hypertension, unspecified: Secondary | ICD-10-CM | POA: Diagnosis not present

## 2017-07-03 DIAGNOSIS — I2510 Atherosclerotic heart disease of native coronary artery without angina pectoris: Secondary | ICD-10-CM | POA: Diagnosis not present

## 2017-07-03 DIAGNOSIS — I255 Ischemic cardiomyopathy: Secondary | ICD-10-CM | POA: Diagnosis not present

## 2017-07-03 DIAGNOSIS — Z7982 Long term (current) use of aspirin: Secondary | ICD-10-CM | POA: Diagnosis not present

## 2017-07-03 DIAGNOSIS — J449 Chronic obstructive pulmonary disease, unspecified: Secondary | ICD-10-CM | POA: Diagnosis not present

## 2017-07-03 DIAGNOSIS — E785 Hyperlipidemia, unspecified: Secondary | ICD-10-CM | POA: Diagnosis not present

## 2017-07-03 DIAGNOSIS — R197 Diarrhea, unspecified: Secondary | ICD-10-CM | POA: Diagnosis not present

## 2017-07-03 NOTE — Telephone Encounter (Signed)
Copied from [place] [id number]. Topic: Quick Communication - See Telephone Encounter >> Jul 03, 2017 12:34 PM Vernona Rieger wrote: CRM for notification. See Telephone encounter for: 07/03/17.  Shaunda, physical therapist from [facility] needs verbals orders to continue to see the patient for physical therapy for once a week for one week, twice a week for four weeks, once a week for four weeks.  Call back is [phone]

## 2017-07-04 LAB — CLOSTRIDIUM DIFFICILE TOXIN B, QUALITATIVE, REAL-TIME PCR: Toxigenic C. Difficile by PCR: NOT DETECTED

## 2017-07-04 NOTE — Telephone Encounter (Signed)
Author phoned Shaunda, PT from [facility] to approve PT orders as written per Debbrah Alar, NP approval. No follow-up needed at this time.

## 2017-07-08 DIAGNOSIS — I2510 Atherosclerotic heart disease of native coronary artery without angina pectoris: Secondary | ICD-10-CM | POA: Diagnosis not present

## 2017-07-08 DIAGNOSIS — I255 Ischemic cardiomyopathy: Secondary | ICD-10-CM | POA: Diagnosis not present

## 2017-07-08 DIAGNOSIS — J449 Chronic obstructive pulmonary disease, unspecified: Secondary | ICD-10-CM | POA: Diagnosis not present

## 2017-07-08 DIAGNOSIS — N183 Chronic kidney disease, stage 3 (moderate): Secondary | ICD-10-CM | POA: Diagnosis not present

## 2017-07-08 DIAGNOSIS — I5042 Chronic combined systolic (congestive) and diastolic (congestive) heart failure: Secondary | ICD-10-CM | POA: Diagnosis not present

## 2017-07-08 DIAGNOSIS — I130 Hypertensive heart and chronic kidney disease with heart failure and stage 1 through stage 4 chronic kidney disease, or unspecified chronic kidney disease: Secondary | ICD-10-CM | POA: Diagnosis not present

## 2017-07-10 ENCOUNTER — Telehealth: Payer: Self-pay | Admitting: Cardiology

## 2017-07-10 NOTE — Telephone Encounter (Signed)
   [facility] Pre-operative Risk Assessment    Request for surgical clearance:  1. What type of surgery is being performed? Prostate biopsy for rising PSA   2. When is this surgery scheduled? TBD   3. What type of clearance is required (medical clearance vs. Pharmacy clearance to hold med vs. Both)? Both   4. Are there any medications that need to be held prior to surgery and how long? Warfarin & ASA 5 days prior   5. Practice name and name of physician performing surgery? Dr. Junious Silk @ [facility]    6. What is your office phone number [phone]      7.   What is your office fax number [phone] (Attn: Lauren)   8.   Anesthesia type (None, local, MAC, general) ? Not specified    Kenneth Bell 07/10/2017, 4:59 PM  _________________________________________________________________   (provider comments below)

## 2017-07-11 DIAGNOSIS — I255 Ischemic cardiomyopathy: Secondary | ICD-10-CM | POA: Diagnosis not present

## 2017-07-11 DIAGNOSIS — I5042 Chronic combined systolic (congestive) and diastolic (congestive) heart failure: Secondary | ICD-10-CM | POA: Diagnosis not present

## 2017-07-11 DIAGNOSIS — J449 Chronic obstructive pulmonary disease, unspecified: Secondary | ICD-10-CM | POA: Diagnosis not present

## 2017-07-11 DIAGNOSIS — I2510 Atherosclerotic heart disease of native coronary artery without angina pectoris: Secondary | ICD-10-CM | POA: Diagnosis not present

## 2017-07-11 DIAGNOSIS — I130 Hypertensive heart and chronic kidney disease with heart failure and stage 1 through stage 4 chronic kidney disease, or unspecified chronic kidney disease: Secondary | ICD-10-CM | POA: Diagnosis not present

## 2017-07-11 DIAGNOSIS — N183 Chronic kidney disease, stage 3 (moderate): Secondary | ICD-10-CM | POA: Diagnosis not present

## 2017-07-11 NOTE — Telephone Encounter (Signed)
    Primary Cardiologist: Glenetta Hew, MD  Dr. Ellyn Hack last saw Mr. Rinck 04/09/2017.  Attempted to contact the patient by phone, only one number listed in the chart.  Left a message asking him to call back between 1:30 PM and 5 PM and asked to speak to the PA/NP doing preoperative evaluation.  Rosaria Ferries, PA-C 07/11/2017 3:36 PM Beeper [phone]

## 2017-07-11 NOTE — Telephone Encounter (Signed)
Follow up    Please returning call after 5pm. Please call again

## 2017-07-11 NOTE — Telephone Encounter (Signed)
Try to return his call, no answer and mailbox was full.  Rosaria Ferries, PA-C 07/11/2017 5:21 PM Beeper [phone]

## 2017-07-14 ENCOUNTER — Telehealth: Payer: Self-pay | Admitting: *Deleted

## 2017-07-14 DIAGNOSIS — I5042 Chronic combined systolic (congestive) and diastolic (congestive) heart failure: Secondary | ICD-10-CM | POA: Diagnosis not present

## 2017-07-14 DIAGNOSIS — I255 Ischemic cardiomyopathy: Secondary | ICD-10-CM | POA: Diagnosis not present

## 2017-07-14 DIAGNOSIS — N183 Chronic kidney disease, stage 3 (moderate): Secondary | ICD-10-CM | POA: Diagnosis not present

## 2017-07-14 DIAGNOSIS — I130 Hypertensive heart and chronic kidney disease with heart failure and stage 1 through stage 4 chronic kidney disease, or unspecified chronic kidney disease: Secondary | ICD-10-CM | POA: Diagnosis not present

## 2017-07-14 DIAGNOSIS — I2510 Atherosclerotic heart disease of native coronary artery without angina pectoris: Secondary | ICD-10-CM | POA: Diagnosis not present

## 2017-07-14 DIAGNOSIS — J449 Chronic obstructive pulmonary disease, unspecified: Secondary | ICD-10-CM | POA: Diagnosis not present

## 2017-07-14 NOTE — Telephone Encounter (Signed)
Received Home Health Certification and Plan of Care; forwarded to provider/SLS 06/03

## 2017-07-15 NOTE — Telephone Encounter (Signed)
   Primary Cardiologist:David Ellyn Hack, MD  Chart reviewed as part of pre-operative protocol coverage. Given question about holding Coumadin will route to pharmacy and also to Dr. Ellyn Hack regarding aspirin. Patient has h/o MI 2002, PCI in 2005, CABGx4 2013, chronic combined CHF, BiVICD, PAF/more persistent, pleural effusions, dementia, cerebrovascular disease, brain aneurysm, HTN, HLD, mild aortic stenosis, pulm HTN. Please route response to P CV DIV PREOP, then I will contact patient to clarify cardiac status for formal clearance.  Charlie Pitter, PA-C 07/15/2017, 4:38 PM

## 2017-07-16 DIAGNOSIS — N183 Chronic kidney disease, stage 3 (moderate): Secondary | ICD-10-CM | POA: Diagnosis not present

## 2017-07-16 DIAGNOSIS — I130 Hypertensive heart and chronic kidney disease with heart failure and stage 1 through stage 4 chronic kidney disease, or unspecified chronic kidney disease: Secondary | ICD-10-CM | POA: Diagnosis not present

## 2017-07-16 DIAGNOSIS — I5042 Chronic combined systolic (congestive) and diastolic (congestive) heart failure: Secondary | ICD-10-CM | POA: Diagnosis not present

## 2017-07-16 DIAGNOSIS — I255 Ischemic cardiomyopathy: Secondary | ICD-10-CM | POA: Diagnosis not present

## 2017-07-16 DIAGNOSIS — I2510 Atherosclerotic heart disease of native coronary artery without angina pectoris: Secondary | ICD-10-CM | POA: Diagnosis not present

## 2017-07-16 DIAGNOSIS — J449 Chronic obstructive pulmonary disease, unspecified: Secondary | ICD-10-CM | POA: Diagnosis not present

## 2017-07-16 NOTE — Telephone Encounter (Signed)
Patient with diagnosis of Afib on warfarin for anticoagulation.    Procedure: prostate biopsy Date of procedure: TBD  CHADS2-VASc score of  7 (CHF, HTN, AGE, DM2, stroke/tia x 2 (cerebral thrombosis with infarction), CAD, AGE, male)  Given age will review with Dr. Ellyn Hack if lovenox bridge indicated.

## 2017-07-17 NOTE — Telephone Encounter (Signed)
Doesn't need bridge.  OK to hold ASA.  [place]

## 2017-07-17 NOTE — Telephone Encounter (Signed)
   Primary Cardiologist: Glenetta Hew, MD  Chart reviewed as part of pre-operative protocol coverage. Patient with history as below. Was able to reach patient - he feels he is overall doing well/stable. He is participating in PT which he feels has helped his endurance some. He has chronic stable dyspnea and no chest pain whatsoever. No change in status since seen 03/2017, doing a little better since that time. We discussed that given his medical history and age he is at increased risk for surgery regardless of any kind of testing we would undertake. He understands this. Therefore, if prostate biopsy is deemed necessary. given past medical history and time since last visit, based on ACC/AHA guidelines, Kenneth Bell would be at acceptable risk for the planned procedure without further cardiovascular testing. Per Dr. Ellyn Hack OK to hold aspirin and warfarin as directed. He does not feel a bridge is needed.  I will route this recommendation to the requesting party via Epic fax function and remove from pre-op pool.  Please call with questions.  Charlie Pitter, PA-C 07/17/2017, 1:23 PM

## 2017-07-17 NOTE — Telephone Encounter (Signed)
Message received. I see it was also routed to nursing- I'll plan to address with patient in preop this afternoon. Pinkie Manger PA-C

## 2017-07-21 ENCOUNTER — Telehealth: Payer: Self-pay | Admitting: Family Medicine

## 2017-07-21 ENCOUNTER — Encounter: Payer: Self-pay | Admitting: Family Medicine

## 2017-07-21 ENCOUNTER — Telehealth: Payer: Self-pay | Admitting: Cardiology

## 2017-07-21 MED ORDER — METOLAZONE 2.5 MG PO TABS: 2.5000 mg | ORAL_TABLET | ORAL | 6 refills | Status: DC | PRN

## 2017-07-21 NOTE — Telephone Encounter (Signed)
Spoke with pt, he is gaining weight and has swelling in his ankles. Refill sent to the preferred pharmacy.

## 2017-07-21 NOTE — Telephone Encounter (Signed)
New Message    Pt c/o medication issue:  1. Name of Medication: metolazone (ZAROXOLYN) 2.5 MG tablet  2. How are you currently taking this medication (dosage and times per day)?  3. Are you having a reaction (difficulty breathing--STAT)? Some mild shortness of breath   4. What is your medication issue? Patient is calling because he was taking metolazone and never did get it filled. He is now having some mild shortness of breath and wants to know can he resume taking this medication. Please call

## 2017-07-21 NOTE — Telephone Encounter (Signed)
Called and confirmed the need of letter/order to be faxed over giving PCP ok to administer. Faxed letter to number given Attn: Earnest Bailey

## 2017-07-21 NOTE — Telephone Encounter (Signed)
I do not believe there is any medication that Kenneth Bell cannot take himself.

## 2017-07-21 NOTE — Telephone Encounter (Signed)
Copied from [place] [id number]. Topic: Quick Communication - See Telephone Encounter >> Jul 21, 2017 12:36 PM [name], NT wrote: CRM for notification. See Telephone encounter for: 07/21/17.  Kenneth Bell is calling from [name] in [place], she would like a clarification stating that the patient can self administer his medications. If the patient can not self administer medicine then they need that order as well. She states they are going to need this today due to a best survey.   Fax# [phone] Cb# [phone]

## 2017-07-21 NOTE — Telephone Encounter (Signed)
Note    New Message    Pt c/o medication issue:  1. Name of Medication: metolazone (ZAROXOLYN) 2.5 MG tablet  2. How are you currently taking this medication (dosage and times per day)?  3. Are you having a reaction (difficulty breathing--STAT)? Some mild shortness of breath   4. What is your medication issue? Patient is calling because he was taking metolazone and never did get it filled. He is now having some mild shortness of breath and wants to know can he resume taking this medication. Please call

## 2017-07-22 DIAGNOSIS — N183 Chronic kidney disease, stage 3 (moderate): Secondary | ICD-10-CM | POA: Diagnosis not present

## 2017-07-22 DIAGNOSIS — I130 Hypertensive heart and chronic kidney disease with heart failure and stage 1 through stage 4 chronic kidney disease, or unspecified chronic kidney disease: Secondary | ICD-10-CM | POA: Diagnosis not present

## 2017-07-22 DIAGNOSIS — J449 Chronic obstructive pulmonary disease, unspecified: Secondary | ICD-10-CM | POA: Diagnosis not present

## 2017-07-22 DIAGNOSIS — I2510 Atherosclerotic heart disease of native coronary artery without angina pectoris: Secondary | ICD-10-CM | POA: Diagnosis not present

## 2017-07-22 DIAGNOSIS — I255 Ischemic cardiomyopathy: Secondary | ICD-10-CM | POA: Diagnosis not present

## 2017-07-22 DIAGNOSIS — I5042 Chronic combined systolic (congestive) and diastolic (congestive) heart failure: Secondary | ICD-10-CM | POA: Diagnosis not present

## 2017-07-23 ENCOUNTER — Ambulatory Visit (INDEPENDENT_AMBULATORY_CARE_PROVIDER_SITE_OTHER): Payer: Medicare Other | Admitting: Pharmacist

## 2017-07-23 DIAGNOSIS — I4819 Other persistent atrial fibrillation: Secondary | ICD-10-CM

## 2017-07-23 DIAGNOSIS — I481 Persistent atrial fibrillation: Secondary | ICD-10-CM

## 2017-07-23 DIAGNOSIS — Z7901 Long term (current) use of anticoagulants: Secondary | ICD-10-CM | POA: Diagnosis not present

## 2017-07-23 LAB — POCT INR: INR: 1.9 — AB (ref 2.0–3.0)

## 2017-07-25 DIAGNOSIS — J449 Chronic obstructive pulmonary disease, unspecified: Secondary | ICD-10-CM | POA: Diagnosis not present

## 2017-07-25 DIAGNOSIS — I255 Ischemic cardiomyopathy: Secondary | ICD-10-CM | POA: Diagnosis not present

## 2017-07-25 DIAGNOSIS — N183 Chronic kidney disease, stage 3 (moderate): Secondary | ICD-10-CM | POA: Diagnosis not present

## 2017-07-25 DIAGNOSIS — I5042 Chronic combined systolic (congestive) and diastolic (congestive) heart failure: Secondary | ICD-10-CM | POA: Diagnosis not present

## 2017-07-25 DIAGNOSIS — I2510 Atherosclerotic heart disease of native coronary artery without angina pectoris: Secondary | ICD-10-CM | POA: Diagnosis not present

## 2017-07-25 DIAGNOSIS — I130 Hypertensive heart and chronic kidney disease with heart failure and stage 1 through stage 4 chronic kidney disease, or unspecified chronic kidney disease: Secondary | ICD-10-CM | POA: Diagnosis not present

## 2017-07-28 DIAGNOSIS — I2510 Atherosclerotic heart disease of native coronary artery without angina pectoris: Secondary | ICD-10-CM | POA: Diagnosis not present

## 2017-07-28 DIAGNOSIS — J449 Chronic obstructive pulmonary disease, unspecified: Secondary | ICD-10-CM | POA: Diagnosis not present

## 2017-07-28 DIAGNOSIS — N183 Chronic kidney disease, stage 3 (moderate): Secondary | ICD-10-CM | POA: Diagnosis not present

## 2017-07-28 DIAGNOSIS — I5042 Chronic combined systolic (congestive) and diastolic (congestive) heart failure: Secondary | ICD-10-CM | POA: Diagnosis not present

## 2017-07-28 DIAGNOSIS — I130 Hypertensive heart and chronic kidney disease with heart failure and stage 1 through stage 4 chronic kidney disease, or unspecified chronic kidney disease: Secondary | ICD-10-CM | POA: Diagnosis not present

## 2017-07-28 DIAGNOSIS — I255 Ischemic cardiomyopathy: Secondary | ICD-10-CM | POA: Diagnosis not present

## 2017-07-28 NOTE — Telephone Encounter (Signed)
Follow up    Please fax pre op letter to [facility]

## 2017-07-30 DIAGNOSIS — I130 Hypertensive heart and chronic kidney disease with heart failure and stage 1 through stage 4 chronic kidney disease, or unspecified chronic kidney disease: Secondary | ICD-10-CM | POA: Diagnosis not present

## 2017-07-30 DIAGNOSIS — J449 Chronic obstructive pulmonary disease, unspecified: Secondary | ICD-10-CM | POA: Diagnosis not present

## 2017-07-30 DIAGNOSIS — I2510 Atherosclerotic heart disease of native coronary artery without angina pectoris: Secondary | ICD-10-CM | POA: Diagnosis not present

## 2017-07-30 DIAGNOSIS — N183 Chronic kidney disease, stage 3 (moderate): Secondary | ICD-10-CM | POA: Diagnosis not present

## 2017-07-30 DIAGNOSIS — I255 Ischemic cardiomyopathy: Secondary | ICD-10-CM | POA: Diagnosis not present

## 2017-07-30 DIAGNOSIS — I5042 Chronic combined systolic (congestive) and diastolic (congestive) heart failure: Secondary | ICD-10-CM | POA: Diagnosis not present

## 2017-08-01 ENCOUNTER — Ambulatory Visit (INDEPENDENT_AMBULATORY_CARE_PROVIDER_SITE_OTHER): Payer: Medicare Other | Admitting: Gastroenterology

## 2017-08-01 ENCOUNTER — Encounter: Payer: Self-pay | Admitting: Gastroenterology

## 2017-08-01 ENCOUNTER — Encounter

## 2017-08-01 VITALS — BP 108/66 | HR 60 | Ht 71.0 in | Wt 192.4 lb

## 2017-08-01 DIAGNOSIS — R197 Diarrhea, unspecified: Secondary | ICD-10-CM

## 2017-08-01 DIAGNOSIS — R140 Abdominal distension (gaseous): Secondary | ICD-10-CM

## 2017-08-01 DIAGNOSIS — Z860100 Personal history of colon polyps, unspecified: Secondary | ICD-10-CM

## 2017-08-01 NOTE — Patient Instructions (Signed)
If you are age 82 or older, your body mass index should be between 23-30. Your Body mass index is 26.83 kg/m. If this is out of the aforementioned range listed, please consider follow up with your Primary Care Provider.  If you are age 84 or younger, your body mass index should be between 19-25. Your Body mass index is 26.83 kg/m. If this is out of the aformentioned range listed, please consider follow up with your Primary Care Provider.   It has been recommended to you by your physician that you have a(n) Colonoscopy completed. Per your request, we did not schedule the procedure(s) today. Please contact our office at [phone] should you decide to have the procedure completed.  Thank you,  Dr. Jackquline Denmark

## 2017-08-01 NOTE — Progress Notes (Signed)
Chief Complaint: diarrhea  Referring Provider:  Debbrah Alar, NP      ASSESSMENT AND PLAN;   #1. Diarrhea (resolved)  #2. Abdominal Bloating with weight gain.  Could represent transient lactose intolerance or postinfectious IBS.  Patient quite worried about colon cancers. - Proceed with prostate Bx. - Needs colonoscopy 3-4 weeks after, with MiraLAX preparation. Can hold coumadin 5 days before (as per notes he does not need Lovenox bridging). Continue baby ASA throughout.  I discussed risks and benefits in detail. -- Proceed with CT scan of the abdomen and pelvis if the above work-up is negative and he has continued problems.   HPI:    Kenneth Bell is a 84 y.o. male  Diarrhea x 6-8 weeks Now completely resolved Still feels that there is something wrong with his abdomen and would like to arrange for colonoscopy as he had polyps in the colonoscopy by Dr. Greig Castilla in 2014 He has gained weight. No nausea, vomiting, heartburn, regurgitation, odynophagia or dysphagia.  No significant diarrhea or constipation.  There is no melena or hematochezia.  He denies having any abdominal pain but does feel bloated.  He is awaiting prostate biopsy.  He has been cleared by cardiology to hold Coumadin 5 days before and there is no need for Lovenox bridging.  Past Medical History:  Diagnosis Date  . Brain aneurysm   . Chronic combined systolic and diastolic heart failure, NYHA class 3 (HCC)    EF has seemed to vary report-to-report. Myoview 2007 showed EF 30%, echo in 2016 7 EF 60-65% -> Echo in 08/2015 & 08/2016  40-45%  (with basal-mid inferoseptal & apical inferolateral wall), Mod P HTN (PAP ~53 mHg) --most recent in January 2019 says EF 45-50%.  . CKD (chronic kidney disease), stage III ([place]) 08/26/2016  . COPD (chronic obstructive pulmonary disease) ([place])   . Coronary artery disease involving native heart with angina pectoris ([place])    a. MI 2002 - PCI to [place]. b. 2005: PCI to LAD, OM1 &  OM2.  c. s/p CABG in 2013: LIMA to LAD, SVG to OM2, SVG to PDA and SVG to RI. ; d. Cath 2017: occluded LAD, OM1 & OM2, SVG-RI with patent LIMA-LAD, SVG-rPDA & SVG-OM2.   . Essential hypertension   . Hyperlipidemia with target LDL less than 70   . Mild aortic stenosis 08/28/2016   Stenosis with mild regurgitation.  . Moderate pulmonary arterial systolic hypertension (HCC) 08/28/2016   PA pressures F has been estimated at 53-59 mmHg on echo-   . Persistent atrial fibrillation ([place])   . Presence of biventricular implantable cardioverter-defibrillator (ICD) 2008  . Thyroid disease     Past Surgical History:  Procedure Laterality Date  . APPENDECTOMY    . CARDIAC CATHETERIZATION  06/2015   Left main patent. Diffuse LAD disease with competitive flow and distal LAD from LIMA. Mid Cx 35% with occlusion of OM 1 and 90% dCx. OM 2 occluded (perfuse via SVG). Ostial RCA 70%, dRCA 100%.  Patent LIMA-LAD. Widely patent SVG-OM, SVG-PDA: 40% proximal. 100% proximal SVG-RI  . CARDIAC SURGERY    . CARPAL TUNNEL RELEASE Right 2017  . COLONOSCOPY  09/05/2012   Colon Polyps  . CORONARY ANGIOPLASTY WITH STENT PLACEMENT     Prior PCI to OM 2; 2005 PCI to LAD and OM1 with a Taxus DES 2.5 x 16 mm (postdilated to 3.8 mm) and PTCA of OM 2 stent  . CORONARY ARTERY BYPASS GRAFT  2013   LIMA-LAD, SVG-L1-2,  SVG-PDA, SVG-RI (known occlusion of SVG RI)  . KNEE SURGERY Bilateral 1999  . NM MYOVIEW LTD  04/2015   Prior lateral infarct with no scanning. EF 31% with moderate global HK  . SHOULDER SURGERY Bilateral    Twice  . TRANSTHORACIC ECHOCARDIOGRAM  08/2015   Moderate LV dysfunction with global HK (estimated at 40 and 45%). Worse anterolateral and inferolateral wall.  . TRANSTHORACIC ECHOCARDIOGRAM  08/2016   EF 40-45%. Moderate concentric LVH. Akinesis of basal-mid inferoseptal and apical inferolateral wall.  Aortic sclerosis, no stenosis. Moderate mitral thickening with mild MR. Moderate RV dilation. Moderate TR  and PR. PA pressures 53 mmHg  . TRANSTHORACIC ECHOCARDIOGRAM  02/2017    EF 45-50% (similar to last).  Apical-inferolateral akinesis.  Severe focal basal and moderate concentric LVH.  Mild aortic stenosis.  Elevated PA pressures with peak pressures estimated 59 mmHg (moderate PA hypertension)  . VEIN BYPASS SURGERY  2013    Family History  Problem Relation Age of Onset  . Arthritis Mother   . Heart disease Father     Social History   Tobacco Use  . Smoking status: Former Smoker    Types: Cigarettes    Last attempt to quit: 05/30/1967    Years since quitting: 50.2  . Smokeless tobacco: Never Used  Substance Use Topics  . Alcohol use: Yes    Alcohol/week: 3.6 oz    Types: 6 Glasses of wine per week  . Drug use: No    Current Outpatient Medications  Medication Sig Dispense Refill  . aspirin EC 81 MG EC tablet Take 1 tablet (81 mg total) by mouth daily. 30 tablet 6  . bumetanide (BUMEX) 1 MG tablet Take 2 tablets (2 mg total) by mouth daily. MAY TAKE AN EXTRA TABLET IF NEEDED DEPENDING ON DAILY WEIGHT 225 tablet 3  . cyanocobalamin 1000 MCG tablet Take 1 tablet (1,000 mcg total) by mouth daily. 30 tablet 0  . levETIRAcetam (KEPPRA) 500 MG tablet TAKE 1 TABLET BY MOUTH TWICE DAILY 60 tablet 2  . levothyroxine (SYNTHROID, LEVOTHROID) 50 MCG tablet TAKE 1 TABLET BY MOUTH ONCE DAILY 90 tablet 1  . metolazone (ZAROXOLYN) 2.5 MG tablet Take 1 tablet (2.5 mg total) by mouth as needed. TAKE 30 MIN BEFORE  BUMEX MORNING DOSE IF NEEDED 30 tablet 6  . metoprolol succinate (TOPROL-XL) 25 MG 24 hr tablet Take 1 tablet (25 mg total) by mouth daily. 30 tablet 5  . omeprazole (PRILOSEC) 20 MG capsule Take 1 capsule (20 mg total) by mouth daily. 90 capsule 2  . potassium chloride (MICRO-K) 10 MEQ CR capsule Take 1 capsule (10 mEq total) by mouth daily. 30 capsule 5  . simvastatin (ZOCOR) 20 MG tablet TAKE 1 TABLET BY MOUTH ONCE DAILY IN THE EVENING 90 tablet 0  . warfarin (COUMADIN) 6 MG tablet  CONTINUE SAME HOME DOSE: Take 1/2 tablet by mouth on Mondays and Fridays, and 1 whole tablet per day on all other days.    Marland Kitchen warfarin (COUMADIN) 6 MG tablet TAKE 1 TABLET BY MOUTH ONCE DAILY AT  6  PM  EXCEPT  ON  MONDAYS  AND  FRIDAYS  ONLY  TAKE  ONE-HALF  TABLET 90 tablet 1   No current facility-administered medications for this visit.     Allergies  Allergen Reactions  . Lipitor  [Atorvastatin]   . Nitroglycerin Other (See Comments)    DROPS BLOOD PRESSURE DRASTICALLY Other reaction(s): Other (See Comments) BP drops    Review of  Systems:  Constitutional: Denies fever, chills, diaphoresis, appetite change, has fatigue.  HEENT: Denies photophobia, eye pain, redness, , ear pain, congestion, sore throat, rhinorrhea, sneezing, mouth sores, neck pain, neck stiffness and tinnitus.  Hard of hearing. Respiratory: Denies SOB, DOE, cough, chest tightness,  and wheezing.   Cardiovascular: Denies chest pain, palpitations and leg swelling.  Genitourinary: Denies dysuria, urgency, frequency, hematuria, flank pain and difficulty urinating.  Musculoskeletal: Denies myalgias, back pain, joint swelling, arthralgias and gait problem.  Skin: No rash.  Neurological: Denies dizziness, seizures, syncope, weakness, light-headedness, numbness and headaches.  Hematological: Denies adenopathy. Easy bruising, personal or family bleeding history  Psychiatric/Behavioral: No anxiety or depression     Physical Exam:    BP 108/66   Pulse 60   Ht 5\' 11"  (1.803 m)   Wt 192 lb 6 oz (87.3 kg)   BMI 26.83 kg/m  Filed Weights   08/01/17 1353  Weight: 192 lb 6 oz (87.3 kg)   Constitutional:  Well-developed, in no acute distress. Psychiatric: Normal mood and affect. Behavior is normal. HEENT: Pupils normal.  Conjunctivae are normal. No scleral icterus. Neck supple.  Cardiovascular: Normal rate, regular rhythm. No edema Pulmonary/chest: Effort normal and breath sounds normal. No wheezing, rales or  rhonchi. Abdominal: Soft, nondistended. Nontender. Bowel sounds active throughout. There are no masses palpable. No hepatomegaly. Rectal:  defered Neurological: Alert and oriented to person place and time. Skin: Skin is warm and dry. No rashes noted.  Data Reviewed: I have personally reviewed following labs and imaging studies  CBC: CBC Latest Ref Rng & Units 05/08/2017 04/16/2017 02/26/2017  WBC 4.0 - 10.5 K/uL 3.9(L) 4.6 5.1  Hemoglobin 13.0 - 17.0 g/dL 11.0(L) 12.0(L) 12.2(L)  Hematocrit 39.0 - 52.0 % 32.9(L) 37.1(L) 37.5(L)  Platelets 150.0 - 400.0 K/uL 218.0 225.0 217.0    CMP: CMP Latest Ref Rng & Units 06/02/2017 05/26/2017 05/22/2017  Glucose 70 - 99 mg/dL 111(H) 128(H) 108(H)  BUN 6 - 23 mg/dL 38(H) 57(H) 56(H)  Creatinine 0.40 - 1.50 mg/dL 1.86(H) 1.98(H) 2.03(H)  Sodium 135 - 145 mEq/L 137 132(L) 133(L)  Potassium 3.5 - 5.1 mEq/L 4.3 3.4(L) 3.0(L)  Chloride 96 - 112 mEq/L 100 90(L) 92(L)  CO2 19 - 32 mEq/L 27 33(H) 33(H)  Calcium 8.4 - 10.5 mg/dL 9.5 9.9 9.7  Total Protein 6.0 - 8.3 g/dL - - 7.8  Total Bilirubin 0.2 - 1.2 mg/dL - - 0.7  Alkaline Phos 39 - 117 U/L - - 100  AST 0 - 37 U/L - - 20  ALT 0 - 53 U/L - - 10     Carmell Austria, MD 08/01/2017, 2:14 PM  Cc: Debbrah Alar, NP

## 2017-08-04 ENCOUNTER — Other Ambulatory Visit: Payer: Self-pay | Admitting: Family Medicine

## 2017-08-04 DIAGNOSIS — I2510 Atherosclerotic heart disease of native coronary artery without angina pectoris: Secondary | ICD-10-CM | POA: Diagnosis not present

## 2017-08-04 DIAGNOSIS — N183 Chronic kidney disease, stage 3 (moderate): Secondary | ICD-10-CM | POA: Diagnosis not present

## 2017-08-04 DIAGNOSIS — I255 Ischemic cardiomyopathy: Secondary | ICD-10-CM | POA: Diagnosis not present

## 2017-08-04 DIAGNOSIS — I130 Hypertensive heart and chronic kidney disease with heart failure and stage 1 through stage 4 chronic kidney disease, or unspecified chronic kidney disease: Secondary | ICD-10-CM | POA: Diagnosis not present

## 2017-08-04 DIAGNOSIS — I5042 Chronic combined systolic (congestive) and diastolic (congestive) heart failure: Secondary | ICD-10-CM | POA: Diagnosis not present

## 2017-08-04 DIAGNOSIS — J449 Chronic obstructive pulmonary disease, unspecified: Secondary | ICD-10-CM | POA: Diagnosis not present

## 2017-08-08 ENCOUNTER — Encounter: Payer: Self-pay | Admitting: Cardiology

## 2017-08-11 DIAGNOSIS — I5042 Chronic combined systolic (congestive) and diastolic (congestive) heart failure: Secondary | ICD-10-CM | POA: Diagnosis not present

## 2017-08-11 DIAGNOSIS — N183 Chronic kidney disease, stage 3 (moderate): Secondary | ICD-10-CM | POA: Diagnosis not present

## 2017-08-11 DIAGNOSIS — I255 Ischemic cardiomyopathy: Secondary | ICD-10-CM | POA: Diagnosis not present

## 2017-08-11 DIAGNOSIS — I2510 Atherosclerotic heart disease of native coronary artery without angina pectoris: Secondary | ICD-10-CM | POA: Diagnosis not present

## 2017-08-11 DIAGNOSIS — I130 Hypertensive heart and chronic kidney disease with heart failure and stage 1 through stage 4 chronic kidney disease, or unspecified chronic kidney disease: Secondary | ICD-10-CM | POA: Diagnosis not present

## 2017-08-11 DIAGNOSIS — J449 Chronic obstructive pulmonary disease, unspecified: Secondary | ICD-10-CM | POA: Diagnosis not present

## 2017-08-15 ENCOUNTER — Ambulatory Visit (INDEPENDENT_AMBULATORY_CARE_PROVIDER_SITE_OTHER): Payer: Medicare Other | Admitting: *Deleted

## 2017-08-15 DIAGNOSIS — I255 Ischemic cardiomyopathy: Secondary | ICD-10-CM

## 2017-08-15 NOTE — Progress Notes (Signed)
Remote ICD transmission.   

## 2017-08-18 ENCOUNTER — Other Ambulatory Visit: Payer: Self-pay | Admitting: Family Medicine

## 2017-08-18 DIAGNOSIS — I130 Hypertensive heart and chronic kidney disease with heart failure and stage 1 through stage 4 chronic kidney disease, or unspecified chronic kidney disease: Secondary | ICD-10-CM | POA: Diagnosis not present

## 2017-08-18 DIAGNOSIS — I255 Ischemic cardiomyopathy: Secondary | ICD-10-CM | POA: Diagnosis not present

## 2017-08-18 DIAGNOSIS — J449 Chronic obstructive pulmonary disease, unspecified: Secondary | ICD-10-CM | POA: Diagnosis not present

## 2017-08-18 DIAGNOSIS — I5042 Chronic combined systolic (congestive) and diastolic (congestive) heart failure: Secondary | ICD-10-CM | POA: Diagnosis not present

## 2017-08-18 DIAGNOSIS — I2510 Atherosclerotic heart disease of native coronary artery without angina pectoris: Secondary | ICD-10-CM | POA: Diagnosis not present

## 2017-08-18 DIAGNOSIS — N183 Chronic kidney disease, stage 3 (moderate): Secondary | ICD-10-CM | POA: Diagnosis not present

## 2017-08-24 ENCOUNTER — Other Ambulatory Visit: Payer: Self-pay | Admitting: Family Medicine

## 2017-08-24 DIAGNOSIS — E039 Hypothyroidism, unspecified: Secondary | ICD-10-CM

## 2017-08-25 ENCOUNTER — Ambulatory Visit: Payer: Self-pay | Admitting: Family Medicine

## 2017-08-25 NOTE — Telephone Encounter (Signed)
Pt called c/o left lower leg from knee to ankle. Pt states has never had the edema before. Pt's edema is moderate in severity. Denies redness or pain or fever. Pt has h/o heart bypass, stents and internal defibrillator. Pt takes diuretics to help with edema. Pt states that he has h/o fluid in his lungs that has had to be drained out.  Pt denies SOB or chest pain.  Care advice given per protocol. Pt verbalized understanding. Pt unable to come within 24 hours to see PCP. Pt has transportation issues ans wanted an appt with Dr Nani Ravens Wednesday. Appt given for pt Wednesday at 3:45 pm.   Reason for Disposition . [1] MODERATE leg swelling (e.g., swelling extends up to knees) AND [2] new onset or worsening    Pt cannot come until Wednesday due to transportation  Answer Assessment - Initial Assessment Questions 1. ONSET: "When did the swelling start?" (e.g., minutes, hours, days)     1 week ago 2. LOCATION: "What part of the leg is swollen?"  "Are both legs swollen or just one leg?"     Calf between knee and ankle- left leg is the one affected 3. SEVERITY: "How bad is the swelling?" (e.g., localized; mild, moderate, severe)  - Localized - small area of swelling localized to one leg  - MILD pedal edema - swelling limited to foot and ankle, pitting edema < 1/4 inch (6 mm) deep, rest and elevation eliminate most or all swelling  - MODERATE edema - swelling of lower leg to knee, pitting edema > 1/4 inch (6 mm) deep, rest and elevation only partially reduce swelling  - SEVERE edema - swelling extends above knee, facial or hand swelling present      moderate 4. REDNESS: "Does the swelling look red or infected?"     no 5. PAIN: "Is the swelling painful to touch?" If so, ask: "How painful is it?"   (Scale 1-10; mild, moderate or severe)     no 6. FEVER: "Do you have a fever?" If so, ask: "What is it, how was it measured, and when did it start?"      no 7. CAUSE: "What do you think is causing the leg  swelling?"     Pt does not know- 8. MEDICAL HISTORY: "Do you have a history of heart failure, kidney disease, liver failure, or cancer?"     Heart: quadruple bypass, stents and internal defibrillator  9. RECURRENT SYMPTOM: "Have you had leg swelling before?" If so, ask: "When was the last time?" "What happened that time?"     Yes takes diuretics - never had swelling before  10. OTHER SYMPTOMS: "Do you have any other symptoms?" (e.g., chest pain, difficulty breathing)       Intermittent diarrhea- h/o fluid in lungs 11. PREGNANCY: "Is there any chance you are pregnant?" "When was your last menstrual period?"       n/a  Protocols used: LEG SWELLING AND EDEMA-A-AH

## 2017-08-26 DIAGNOSIS — J449 Chronic obstructive pulmonary disease, unspecified: Secondary | ICD-10-CM | POA: Diagnosis not present

## 2017-08-26 DIAGNOSIS — I130 Hypertensive heart and chronic kidney disease with heart failure and stage 1 through stage 4 chronic kidney disease, or unspecified chronic kidney disease: Secondary | ICD-10-CM | POA: Diagnosis not present

## 2017-08-26 DIAGNOSIS — I5042 Chronic combined systolic (congestive) and diastolic (congestive) heart failure: Secondary | ICD-10-CM | POA: Diagnosis not present

## 2017-08-26 DIAGNOSIS — I2510 Atherosclerotic heart disease of native coronary artery without angina pectoris: Secondary | ICD-10-CM | POA: Diagnosis not present

## 2017-08-26 DIAGNOSIS — N183 Chronic kidney disease, stage 3 (moderate): Secondary | ICD-10-CM | POA: Diagnosis not present

## 2017-08-26 DIAGNOSIS — I255 Ischemic cardiomyopathy: Secondary | ICD-10-CM | POA: Diagnosis not present

## 2017-08-27 ENCOUNTER — Ambulatory Visit (HOSPITAL_BASED_OUTPATIENT_CLINIC_OR_DEPARTMENT_OTHER): Payer: Medicare Other

## 2017-08-27 ENCOUNTER — Ambulatory Visit (INDEPENDENT_AMBULATORY_CARE_PROVIDER_SITE_OTHER): Payer: Medicare Other | Admitting: Family Medicine

## 2017-08-27 ENCOUNTER — Ambulatory Visit (INDEPENDENT_AMBULATORY_CARE_PROVIDER_SITE_OTHER): Payer: Medicare Other | Admitting: Pharmacist

## 2017-08-27 ENCOUNTER — Encounter: Payer: Self-pay | Admitting: Family Medicine

## 2017-08-27 VITALS — BP 110/68 | HR 75 | Temp 98.8°F | Ht 71.0 in | Wt 191.0 lb

## 2017-08-27 DIAGNOSIS — Z7901 Long term (current) use of anticoagulants: Secondary | ICD-10-CM

## 2017-08-27 DIAGNOSIS — I481 Persistent atrial fibrillation: Secondary | ICD-10-CM | POA: Diagnosis not present

## 2017-08-27 DIAGNOSIS — I4819 Other persistent atrial fibrillation: Secondary | ICD-10-CM

## 2017-08-27 DIAGNOSIS — M7989 Other specified soft tissue disorders: Secondary | ICD-10-CM

## 2017-08-27 DIAGNOSIS — R791 Abnormal coagulation profile: Secondary | ICD-10-CM | POA: Diagnosis not present

## 2017-08-27 LAB — POCT INR: INR: 1.5 — AB (ref 2.0–3.0)

## 2017-08-27 NOTE — Progress Notes (Signed)
Pre visit review using our clinic review tool, if applicable. No additional management support is needed unless otherwise documented below in the visit note. 

## 2017-08-27 NOTE — Patient Instructions (Signed)
Description   Take 1.5 tablets today then, then change dose to  1 tablet daily except 1/2 tablet each Monday.  Repeat INR in 3 weeks

## 2017-08-27 NOTE — Patient Instructions (Signed)
If you start having worsening shortness of breath or coughing, go to ER.   You are scheduled downstairs tomorrow at 11 AM.  Mind salt intake, try to stay active and elevate legs. Consider compression stockings if no better, though this is not going to be comfy during this time of year.

## 2017-08-27 NOTE — Progress Notes (Signed)
Chief Complaint  Patient presents with  . Leg Swelling    Derrill Kay here for left leg swelling.  Duration: 10 days Hx of prolonged bedrest, recent surgery, travel or injury? No Pain the calf? No  SOB? No Personal or family history of clot or bleeding disorder? No Hx of heart failure, renal failure, hepatic failure? Yes  Anti-itch cream has helped a little.  ROS:  MSK- +leg swelling, no pain Lungs- no SOB  Past Medical History:  Diagnosis Date  . Brain aneurysm   . Chronic combined systolic and diastolic heart failure, NYHA class 3 (HCC)    EF has seemed to vary report-to-report. Myoview 2007 showed EF 30%, echo in 2016 7 EF 60-65% -> Echo in 08/2015 & 08/2016  40-45%  (with basal-mid inferoseptal & apical inferolateral wall), Mod P HTN (PAP ~53 mHg) --most recent in January 2019 says EF 45-50%.  . CKD (chronic kidney disease), stage III ([place]) 08/26/2016  . COPD (chronic obstructive pulmonary disease) ([place])   . Coronary artery disease involving native heart with angina pectoris ([place])    a. MI 2002 - PCI to [place]. b. 2005: PCI to LAD, OM1 & OM2.  c. s/p CABG in 2013: LIMA to LAD, SVG to OM2, SVG to PDA and SVG to RI. ; d. Cath 2017: occluded LAD, OM1 & OM2, SVG-RI with patent LIMA-LAD, SVG-rPDA & SVG-OM2.   . Essential hypertension   . Hyperlipidemia with target LDL less than 70   . Mild aortic stenosis 08/28/2016   Stenosis with mild regurgitation.  . Moderate pulmonary arterial systolic hypertension (HCC) 08/28/2016   PA pressures F has been estimated at 53-59 mmHg on echo-   . Persistent atrial fibrillation ([place])   . Presence of biventricular implantable cardioverter-defibrillator (ICD) 2008  . Thyroid disease     BP 110/68 (BP Location: Left Arm, Patient Position: Sitting, Cuff Size: Normal)   Pulse 75   Temp 98.8 F (37.1 C) (Oral)   Ht 5\' 11"  (1.803 m)   Wt 191 lb (86.6 kg)   SpO2 96%   BMI 26.64 kg/m  Gen- awake, alert, appears stated age Heart- RRR, no murmurs,  2+ pitting LE edema on L Lungs- CTAB, normal effort w/o accessory muscle use MSK- +ttp over L popliteus and prox calf Psych: Age appropriate judgment and insight  Left leg swelling - Plan: US Venous Img Lower Unilateral Left  Subtherapeutic international normalized ratio (INR)  Orders as above. Ck for DVT. Mind salt, elevate leg and stay active otherwise. Could consider compression stocking. F/u prn. Pt voiced understanding and agreement to the plan.  [place], DO 08/27/17  4:58 PM

## 2017-08-28 ENCOUNTER — Ambulatory Visit (HOSPITAL_BASED_OUTPATIENT_CLINIC_OR_DEPARTMENT_OTHER)
Admission: RE | Admit: 2017-08-28 | Discharge: 2017-08-28 | Disposition: A | Discharge status: Home / Self Care | Payer: Medicare Other | Source: Ambulatory Visit | Attending: Family Medicine | Admitting: Family Medicine

## 2017-08-28 DIAGNOSIS — M7989 Other specified soft tissue disorders: Secondary | ICD-10-CM | POA: Diagnosis not present

## 2017-08-28 DIAGNOSIS — R600 Localized edema: Secondary | ICD-10-CM | POA: Diagnosis not present

## 2017-09-03 ENCOUNTER — Telehealth: Payer: Self-pay | Admitting: *Deleted

## 2017-09-03 NOTE — Telephone Encounter (Signed)
Received Home Health Certification and Plan of Care; forwarded to provider/SLS 07/24

## 2017-09-09 DIAGNOSIS — R972 Elevated prostate specific antigen [PSA]: Secondary | ICD-10-CM | POA: Diagnosis not present

## 2017-09-09 DIAGNOSIS — C61 Malignant neoplasm of prostate: Secondary | ICD-10-CM | POA: Diagnosis not present

## 2017-09-10 LAB — CUP PACEART REMOTE DEVICE CHECK
Battery Remaining Longevity: 66 mo
Brady Statistic RA Percent Paced: 0 %
Brady Statistic RV Percent Paced: 93 %
Date Time Interrogation Session: 20190705070100
HighPow Impedance: 63 Ohm
Implantable Lead Implant Date: 20080918
Implantable Lead Implant Date: 20150918
Implantable Lead Implant Date: 20150918
Implantable Lead Location: 753858
Implantable Lead Location: 753860
Implantable Lead Model: 181
Implantable Lead Model: 5076
Implantable Lead Model: 5076
Implantable Lead Serial Number: 330602
Lead Channel Impedance Value: 538 Ohm
Lead Channel Impedance Value: 646 Ohm
Lead Channel Pacing Threshold Amplitude: 0.3 V
Lead Channel Pacing Threshold Amplitude: 0.6 V
Lead Channel Setting Pacing Amplitude: 2.5 V
Lead Channel Setting Pacing Amplitude: 2.5 V
Lead Channel Setting Pacing Pulse Width: 0.5 ms
Lead Channel Setting Sensing Sensitivity: 1 mV
MDC IDC LEAD IMPLANT DT: 20080918
MDC IDC LEAD LOCATION: 753859
MDC IDC LEAD LOCATION: 753860
MDC IDC MSMT BATTERY REMAINING PERCENTAGE: 92 %
MDC IDC MSMT LEADCHNL LV PACING THRESHOLD PULSEWIDTH: 0.5 ms
MDC IDC MSMT LEADCHNL RA IMPEDANCE VALUE: 483 Ohm
MDC IDC MSMT LEADCHNL RV PACING THRESHOLD PULSEWIDTH: 0.5 ms
MDC IDC PG IMPLANT DT: 20150918
MDC IDC PG SERIAL: 103634
MDC IDC SET LEADCHNL RV PACING PULSEWIDTH: 0.5 ms
MDC IDC SET LEADCHNL RV SENSING SENSITIVITY: 0.6 mV

## 2017-09-17 ENCOUNTER — Ambulatory Visit (INDEPENDENT_AMBULATORY_CARE_PROVIDER_SITE_OTHER): Payer: Medicare Other | Admitting: Pharmacist Clinician (PhC)/ Clinical Pharmacy Specialist

## 2017-09-17 DIAGNOSIS — I4819 Other persistent atrial fibrillation: Secondary | ICD-10-CM

## 2017-09-17 DIAGNOSIS — Z7901 Long term (current) use of anticoagulants: Secondary | ICD-10-CM | POA: Diagnosis not present

## 2017-09-17 DIAGNOSIS — I481 Persistent atrial fibrillation: Secondary | ICD-10-CM | POA: Diagnosis not present

## 2017-09-17 LAB — POCT INR: INR: 1.3 — AB (ref 2.0–3.0)

## 2017-09-17 NOTE — Patient Instructions (Signed)
Description   Take 1.5 tablets Wednesday Aug 7 and Thursday Aug 8, then continue with 1 tablet daily except 1/2 tablet each Monday.  Repeat INR in 3 weeks.    If you switch to [facility] for your checks, please call us to cancel your appointment [phone] Erasmo Downer or Raquel)

## 2017-09-22 ENCOUNTER — Other Ambulatory Visit: Payer: Self-pay | Admitting: Urology

## 2017-09-22 ENCOUNTER — Telehealth: Payer: Self-pay | Admitting: Family Medicine

## 2017-09-22 DIAGNOSIS — C61 Malignant neoplasm of prostate: Secondary | ICD-10-CM

## 2017-09-22 NOTE — Telephone Encounter (Signed)
The patient has currently been going to Cardiology at [facility] in [facility] for his INR check. For convenience/location reasons he would like to start coming to our office. His last INR was done on 09/17/2017 and scheduled on 10/08/2017 at Cardiology.  He would like an ok by PCP to do his next INR and future ones at our office here in [place]. Advise if ok to do.

## 2017-09-23 ENCOUNTER — Ambulatory Visit: Payer: Self-pay | Admitting: Pharmacist

## 2017-09-23 DIAGNOSIS — Z7901 Long term (current) use of anticoagulants: Secondary | ICD-10-CM

## 2017-09-23 DIAGNOSIS — I4819 Other persistent atrial fibrillation: Secondary | ICD-10-CM

## 2017-09-23 NOTE — Telephone Encounter (Signed)
Patient set up next appt nurse visit on 10/08/17

## 2017-09-23 NOTE — Telephone Encounter (Signed)
OK 

## 2017-09-26 ENCOUNTER — Telehealth: Payer: Self-pay | Admitting: Family Medicine

## 2017-09-26 NOTE — Telephone Encounter (Signed)
Called left message to call back 

## 2017-09-26 NOTE — Telephone Encounter (Signed)
Copied from [place] [id number]. Topic: Quick Communication - See Telephone Encounter >> Sep 26, 2017  1:26 PM Burchel, Abbi R wrote: CRM for notification. See Telephone encounter for: 09/26/17.  Kenneth Bell is requesting PT-INR results from Feb 2019-present.  Please advise.     Cb #:  [phone]

## 2017-09-29 ENCOUNTER — Ambulatory Visit (HOSPITAL_COMMUNITY)
Admission: RE | Admit: 2017-09-29 | Discharge: 2017-09-29 | Disposition: A | Discharge status: Home / Self Care | Payer: Medicare Other | Source: Ambulatory Visit | Attending: Urology | Admitting: Urology

## 2017-09-29 DIAGNOSIS — C61 Malignant neoplasm of prostate: Secondary | ICD-10-CM | POA: Diagnosis not present

## 2017-09-29 MED ORDER — TECHNETIUM TC 99M MEDRONATE IV KIT
20.7000 | PACK | Freq: Once | INTRAVENOUS | Status: AC | PRN
  Administered 2017-09-29: 20.7 via INTRAVENOUS

## 2017-09-29 NOTE — Telephone Encounter (Signed)
Have attempted to call and left messages.

## 2017-09-30 NOTE — Telephone Encounter (Signed)
Received call back from [place] at [facility] requesting below results be faxed to Attn: [place]ly [place] [phone]. States that they are missing results from those dates and think that the pt may have given those papers to his family instead of them. Results faxed.

## 2017-10-02 ENCOUNTER — Telehealth: Payer: Self-pay | Admitting: *Deleted

## 2017-10-02 NOTE — Telephone Encounter (Signed)
Received Physician Orders from [facility]; forwarded to provider/SLS 08/22

## 2017-10-08 ENCOUNTER — Telehealth: Payer: Self-pay | Admitting: Cardiology

## 2017-10-08 ENCOUNTER — Ambulatory Visit (INDEPENDENT_AMBULATORY_CARE_PROVIDER_SITE_OTHER): Payer: Medicare Other

## 2017-10-08 DIAGNOSIS — Z7901 Long term (current) use of anticoagulants: Secondary | ICD-10-CM

## 2017-10-08 LAB — POCT INR: INR: 1.8 — AB (ref 2.0–3.0)

## 2017-10-08 NOTE — Addendum Note (Signed)
Addended by: Bunnie Domino on: 10/08/2017 02:33 PM   Modules accepted: Orders

## 2017-10-08 NOTE — Patient Instructions (Signed)
Pt advised per Dr. Nani Ravens patient to take 6 mg daily and return in 10 days. Appointment scheduled for October 17, 2017 @ 2:15 pm.

## 2017-10-08 NOTE — Progress Notes (Signed)
Pre visit review using our clinic tool,if applicable. No additional management support is needed unless otherwise documented below in the visit note.   Pt here for INR check per  Goal INR =  Last INR =1.3  Pt currently takes Coumadin 6 mg all days except Monday and  Friday he takes 3 mg.  Pt denies recent antibiotics, no dietary changes and no unusual bruising / bleeding.  INR today = 1.8  Pt advised per Dr. Nani Ravens patient to take 6 mg daily and return in 10 days. Appointment scheduled for October 17, 2017 @ 2:15 pm.

## 2017-10-08 NOTE — Telephone Encounter (Signed)
Spoke with patient's daughter Nigel Sloop regarding FMLA forms. Patient's daughter states forms were filled out in the past by Dr. Ellyn Hack. Told patient's daughter that Ivin Booty, RN will need to contact her for more information regarding forms.   Contact # - [phone] - [employer])    Routing to Trixie Dredge, RN

## 2017-10-17 ENCOUNTER — Ambulatory Visit (INDEPENDENT_AMBULATORY_CARE_PROVIDER_SITE_OTHER): Payer: Medicare Other

## 2017-10-17 DIAGNOSIS — Z7901 Long term (current) use of anticoagulants: Secondary | ICD-10-CM

## 2017-10-17 LAB — POCT INR: INR: 1.9 — AB (ref 2.0–3.0)

## 2017-10-17 MED ORDER — WARFARIN SODIUM 7.5 MG PO TABS: 7.5000 mg | ORAL_TABLET | Freq: Every day | ORAL | 0 refills | Status: DC

## 2017-10-17 NOTE — Patient Instructions (Signed)
Per Earlie Counts patient to take 7.5 mg on Mondays and Fridays and 6 mg all other days. Return for INR check in 10 days.

## 2017-10-17 NOTE — Progress Notes (Signed)
Reviewed and agree.  Debbrah Alar, NP

## 2017-10-17 NOTE — Progress Notes (Signed)
Reviewed and agree.

## 2017-10-17 NOTE — Progress Notes (Signed)
Pre visit review using our clinic tool,if applicable. No additional management support is needed unless otherwise documented below in the visit note.   Patient in for INR check per order from Dr. N.Wendling dated 10/08/17.  Goal 2.0-3.0  Last INR = 1.8  Patient taking Coumadin 6 mg daily per order.  INR today = 1.9   Per Earlie Counts patient to take 7.5 mg on Mondays and Fridays and 6 mg all other days. Return for INR check in 10 days.

## 2017-10-20 ENCOUNTER — Encounter: Payer: Self-pay | Admitting: Cardiology

## 2017-10-20 ENCOUNTER — Ambulatory Visit (INDEPENDENT_AMBULATORY_CARE_PROVIDER_SITE_OTHER): Payer: Medicare Other | Admitting: Cardiology

## 2017-10-20 VITALS — BP 130/72 | HR 60 | Ht 68.0 in | Wt 197.0 lb

## 2017-10-20 DIAGNOSIS — I5042 Chronic combined systolic (congestive) and diastolic (congestive) heart failure: Secondary | ICD-10-CM

## 2017-10-20 DIAGNOSIS — I255 Ischemic cardiomyopathy: Secondary | ICD-10-CM

## 2017-10-20 DIAGNOSIS — I350 Nonrheumatic aortic (valve) stenosis: Secondary | ICD-10-CM | POA: Diagnosis not present

## 2017-10-20 DIAGNOSIS — Z5111 Encounter for antineoplastic chemotherapy: Secondary | ICD-10-CM | POA: Diagnosis not present

## 2017-10-20 DIAGNOSIS — I4819 Other persistent atrial fibrillation: Secondary | ICD-10-CM

## 2017-10-20 DIAGNOSIS — E785 Hyperlipidemia, unspecified: Secondary | ICD-10-CM

## 2017-10-20 DIAGNOSIS — I10 Essential (primary) hypertension: Secondary | ICD-10-CM | POA: Diagnosis not present

## 2017-10-20 DIAGNOSIS — C61 Malignant neoplasm of prostate: Secondary | ICD-10-CM | POA: Diagnosis not present

## 2017-10-20 DIAGNOSIS — I25709 Atherosclerosis of coronary artery bypass graft(s), unspecified, with unspecified angina pectoris: Secondary | ICD-10-CM

## 2017-10-20 DIAGNOSIS — I2721 Secondary pulmonary arterial hypertension: Secondary | ICD-10-CM

## 2017-10-20 DIAGNOSIS — I481 Persistent atrial fibrillation: Secondary | ICD-10-CM | POA: Diagnosis not present

## 2017-10-20 MED ORDER — METOLAZONE 2.5 MG PO TABS: ORAL_TABLET | ORAL | 6 refills | Status: DC

## 2017-10-20 NOTE — Telephone Encounter (Signed)
RECEIVED FMLA FORMS AT DOCTOR'S VISIT

## 2017-10-20 NOTE — Progress Notes (Signed)
PCP: [name], DO  Clinic Note: Chief Complaint  Patient presents with  . Follow-up    Notes more edema now.  Also dyspnea.  . Congestive Heart Failure  . Coronary Artery Disease    HPI: Kenneth Bell is a 84 y.o. 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  Status post by V ICD in 2017 for EF of 31%.  Improved to 40/40 5% by May 2017.Marland Kitchen He is a long history of CAD -formally a patient of Dr. Gust Brooms. MI 2002 -while living in [place]  PCI to LAD, OM1 and OM 2 in 2005; had catheterization in 2008. PCI in 2011.  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 Sallyanne Kuster here.  S/p CABG 4 in May 2013:(LIMA to LAD, SVG to OM2, SVG to PDA and SVG to RI). Most recent Myoview in March 2017: Demonstrated prior infarct and lateral wall without inducible ischemia. EF 31% with moderate global HK. Last cardiac cath was in May 2017:  Left main patent. Diffuse LAD disease with competitive flow and distal LAD from LIMA. Mid Cx 35% with occlusion of OM 1 and 90% dCx. OM 2 occluded (perfuse via SVG). Ostial RCA 70%, dRCA 100%.  Patent LIMA-LAD. Widely patent SVG-OM, SVG-PDA: 40% proximal. 100% proximal SVG-RI Echo July 2017: Moderate LV dysfunction with global HK. Possibly worse in the anterolateral and inferolateral/apical segments. EF 40-45%.  Chronic Combined Systolic and Diastolic CHF --> baseline weight is usually 191 lb (on home scale)  has not been on ACE inhibitor or ARB secondary to hypotension and chronic kidney disease.  On standing dose Bumex with Sliding Scale PAF (appears to be more persistent)-> on warfarin and Toprol; clearly this exacerbates his heart failure when he goes in [place].  Has history of apparently pleural effusion status post  thoracentesis. h Mataio Mele was last seen on 04/09/2017 --> is doing pretty well.  Seemed to recover from his hospitalizations with confusion.  Had not used any additional metolazone and was on standing dose of Bumex.  He was taking 2 tabs twice daily.  Noted decreased energy level.  Otherwise doing well.  Recent Hospitalizations: n/a  Studies Personally Reviewed - (if available, images/films reviewed: From Epic Chart or Care Everywhere)  Left lower extremity venous Doppler: No evidence of DVT.  Interval History: Kenneth Bell presents here today stating that he has had a couple occasions over the last several weeks where he is to had to take his Zaroxolyn in order to help adjust his weight and any output.  He does note off-and-on exertional dyspnea but that is improved since he adjusted and started taking more diuretic.  He has had intermittent episodes of edema, but no orthopnea or PND.  He occasionally has resting breathing issues from time to time, but not routinely. He denies any anginal symptoms of chest tightness or pressure with rest or exertion.  No sensation of rapid irregular heartbeat/palpitations.  No syncope/near syncope or TIA/amaurosis fugax.  He denies any chest tightness or pressure with rest or exertion. No TIA/amaurosis fugax symptoms. No melena, hematochezia, hematuria, or epstaxis. No claudication.  ROS: A comprehensive was performed. Review of Systems  Constitutional: Positive for malaise/fatigue (But still tries to get around). Negative for weight loss (Weight actually is up).  HENT: Positive for congestion. Negative for  nosebleeds.   Respiratory: Negative for cough (Not really anything new), sputum production and shortness of breath.   Cardiovascular: Positive for leg swelling (Mild).  Gastrointestinal: Negative for blood in stool, constipation and melena.  Genitourinary: Negative for dysuria.  Musculoskeletal: Positive for back pain.  Psychiatric/Behavioral: Negative  for depression and memory loss.  All other systems reviewed and are negative.   I have reviewed and (if needed) personally updated the patient's problem list, medications, allergies, past medical and surgical history, social and family history.   Past Medical History:  Diagnosis Date  . Brain aneurysm   . Chronic combined systolic and diastolic heart failure, NYHA class 3 (HCC)    EF has seemed to vary report-to-report. Myoview 2007 showed EF 30%, echo in 2016 7 EF 60-65% -> Echo in 08/2015 & 08/2016  40-45%  (with basal-mid inferoseptal & apical inferolateral wall), Mod P HTN (PAP ~53 mHg) --most recent in January 2019 says EF 45-50%.  . CKD (chronic kidney disease), stage III ([place]) 08/26/2016  . COPD (chronic obstructive pulmonary disease) ([place])   . Coronary artery disease involving native heart with angina pectoris ([place])    a. MI 2002 - PCI to [place]. b. 2005: PCI to LAD, OM1 & OM2.  c. s/p CABG in 2013: LIMA to LAD, SVG to OM2, SVG to PDA and SVG to RI. ; d. Cath 2017: occluded LAD, OM1 & OM2, SVG-RI with patent LIMA-LAD, SVG-rPDA & SVG-OM2.   . Essential hypertension   . Hyperlipidemia with target LDL less than 70   . Mild aortic stenosis 08/28/2016   Stenosis with mild regurgitation.  . Moderate pulmonary arterial systolic hypertension (HCC) 08/28/2016   PA pressures F has been estimated at 53-59 mmHg on echo-   . Persistent atrial fibrillation ([place])   . Presence of biventricular implantable cardioverter-defibrillator (ICD) 2008  . Thyroid disease     Past Surgical History:  Procedure Laterality Date  . APPENDECTOMY    . CARDIAC CATHETERIZATION  06/2015   Left main patent. Diffuse LAD disease with competitive flow and distal LAD from LIMA. Mid Cx 35% with occlusion of OM 1 and 90% dCx. OM 2 occluded (perfuse via SVG). Ostial RCA 70%, dRCA 100%.  Patent LIMA-LAD. Widely patent SVG-OM, SVG-PDA: 40% proximal. 100% proximal SVG-RI  . CARDIAC SURGERY    . CARPAL TUNNEL RELEASE Right 2017    . COLONOSCOPY  09/05/2012   Colon Polyps  . CORONARY ANGIOPLASTY WITH STENT PLACEMENT     Prior PCI to OM 2; 2005 PCI to LAD and OM1 with a Taxus DES 2.5 x 16 mm (postdilated to 3.8 mm) and PTCA of OM 2 stent  . CORONARY ARTERY BYPASS GRAFT  2013   LIMA-LAD, SVG-L1-2, SVG-PDA, SVG-RI (known occlusion of SVG RI)  . KNEE SURGERY Bilateral 1999  . NM MYOVIEW LTD  04/2015   Prior lateral infarct with no scanning. EF 31% with moderate global HK  . SHOULDER SURGERY Bilateral    Twice  . TRANSTHORACIC ECHOCARDIOGRAM  08/2015   Moderate LV dysfunction with global HK (estimated at 40 and 45%). Worse anterolateral and inferolateral wall.  . TRANSTHORACIC ECHOCARDIOGRAM  08/2016   EF 40-45%. Moderate concentric LVH. Akinesis of basal-mid inferoseptal and apical inferolateral wall.  Aortic sclerosis, no stenosis. Moderate mitral thickening with mild MR. Moderate RV dilation. Moderate TR and PR. PA pressures 53 mmHg  . TRANSTHORACIC ECHOCARDIOGRAM  02/2017    EF 45-50% (similar to last).  Apical-inferolateral akinesis.  Severe focal basal and moderate  concentric LVH.  Mild aortic stenosis.  Elevated PA pressures with peak pressures estimated 59 mmHg (moderate PA hypertension)  . VEIN BYPASS SURGERY  2013    Current Meds  Medication Sig  . aspirin EC 81 MG EC tablet Take 1 tablet (81 mg total) by mouth daily.  . bumetanide (BUMEX) 1 MG tablet Take 2 tablets (2 mg total) by mouth daily. MAY TAKE AN EXTRA TABLET IF NEEDED DEPENDING ON DAILY WEIGHT  . cyanocobalamin 1000 MCG tablet Take 1 tablet (1,000 mcg total) by mouth daily.  Marland Kitchen levETIRAcetam (KEPPRA) 500 MG tablet TAKE 1 TABLET BY MOUTH TWICE DAILY  . levothyroxine (SYNTHROID, LEVOTHROID) 50 MCG tablet TAKE 1 TABLET BY MOUTH ONCE DAILY  . metolazone (ZAROXOLYN) 2.5 MG tablet TAKE 30 MIN BEFORE  BUMEX EVERY  MONDAY MORNING,MAY TAKE A DOSE IF WEIGHT IS 3 LBS OR GREATER IF  NEEDED  . metoprolol succinate (TOPROL-XL) 25 MG 24 hr tablet TAKE 1 TABLET BY  MOUTH ONCE DAILY  . omeprazole (PRILOSEC) 20 MG capsule TAKE 1 CAPSULE BY MOUTH ONCE DAILY  . potassium chloride (MICRO-K) 10 MEQ CR capsule Take 1 capsule (10 mEq total) by mouth daily.  . simvastatin (ZOCOR) 20 MG tablet TAKE 1 TABLET BY MOUTH ONCE DAILY IN THE EVENING  . warfarin (COUMADIN) 6 MG tablet CONTINUE SAME HOME DOSE: Take 1/2 tablet by mouth on Mondays and Fridays, and 1 whole tablet per day on all other days.  Marland Kitchen warfarin (COUMADIN) 7.5 MG tablet Take 1 tablet (7.5 mg total) by mouth daily.  . [DISCONTINUED] metolazone (ZAROXOLYN) 2.5 MG tablet Take 1 tablet (2.5 mg total) by mouth as needed. TAKE 30 MIN BEFORE  BUMEX MORNING DOSE IF NEEDED    Allergies  Allergen Reactions  . Lipitor  [Atorvastatin]   . Nitroglycerin Other (See Comments)    DROPS BLOOD PRESSURE DRASTICALLY Other reaction(s): Other (See Comments) BP drops    Social History   Tobacco Use  . Smoking status: Former Smoker    Types: Cigarettes    Last attempt to quit: 05/30/1967    Years since quitting: 50.4  . Smokeless tobacco: Never Used  Substance Use Topics  . Alcohol use: Yes    Alcohol/week: 6.0 standard drinks    Types: 6 Glasses of wine per week  . Drug use: No   Social History   Social History Narrative   He has been married for 28 years. They have 5 children and 10 grandchildren. 12 great-grandchildren.   He recently moved to [facility], [place] and oriented close to his daughter here. His wife has been moved into an Alzheimer's/memory unit nearby.   He currently lives with his daughter.   He has a college degree and previously worked as a self-employed Materials engineer.   He himself quit driving couple years ago because he was concerned about his reaction time. He is therefore depend upon his family or public transportation.   He quit smoking in 1969 having smoked for 20 years.   He drinks maybe 4-7 drinks alcohol/wine a week.   He does not exercise any longer because of shortness of breath  and lack of energy.    family history includes Arthritis in his mother; Heart disease in his father.  Wt Readings from Last 3 Encounters:  10/20/17 197 lb (89.4 kg)  08/27/17 191 lb (86.6 kg)  08/01/17 192 lb 6 oz (87.3 kg)    PHYSICAL EXAM BP 130/72   Pulse 60   Ht 5\' 8"  (1.727  m)   Wt 197 lb (89.4 kg)   SpO2 97%   BMI 29.95 kg/m  Physical Exam  Constitutional: He is oriented to person, place, and time. He appears well-developed and well-nourished. No distress.  Well-groomed.  HENT:  Head: Normocephalic and atraumatic.  Neck: Normal range of motion. Neck supple. JVD (Minimal J-point elevation.) present.  Pulmonary/Chest: Effort normal and breath sounds normal. No respiratory distress. He has no wheezes.  Abdominal: Soft. Bowel sounds are normal. He exhibits no distension. There is no tenderness. There is no rebound.  Unable to palpate HSM.  Musculoskeletal: Normal range of motion. He exhibits edema (2-3+ bilateral lower extremities from knees down).  Neurological: He is alert and oriented to person, place, and time. No cranial nerve deficit.  Psychiatric: His behavior is normal. Judgment and thought content normal.  Somewhat subdued mood and affect  Vitals reviewed.    Adult ECG Report n/a  Other studies Reviewed: Additional studies/ records that were reviewed today include:  Recent Labs:   Lab Results  Component Value Date   CREATININE 1.86 (H) 06/02/2017   BUN 38 (H) 06/02/2017   NA 137 06/02/2017   K 4.3 06/02/2017   CL 100 06/02/2017   CO2 27 06/02/2017   Lab Results  Component Value Date   CHOL 123 02/21/2017   HDL 31 (L) 02/21/2017   LDLCALC 63 02/21/2017   TRIG 143 02/21/2017   CHOLHDL 4.0 02/21/2017   ASSESSMENT / PLAN: Problem List Items Addressed This Visit    Aortic stenosis, mild (Chronic)   Relevant Medications   metolazone (ZAROXOLYN) 2.5 MG tablet   Chronic combined systolic and diastolic heart failure, NYHA class 3 (HCC) - Primary  (Chronic)    Probably class II-III baseline symptoms.  He has pretty significant edema today.  I think he needs more diuresis.  JVD is not as prominent, but he does have HJR.  Still has exertional dyspnea.    Plan: He should take his metolazone more frequently.  At least once a week and then as needed for weight gain more than 3 pounds.   Continue current dose of Bumex. Reluctant to further push that the reduction.  He is not currently on ARB or ACE inhibitor because of borderline blood pressures.      Relevant Medications   metolazone (ZAROXOLYN) 2.5 MG tablet   Coronary artery disease involving coronary bypass graft of native heart with angina pectoris (HCC) (Chronic)    No active angina symptoms. Remains on aspirin, beta-blocker and statin. Besides dyspnea which is more related to CHF, no angina symptoms. Since he seems to stabilized overall with exception of may be some edema, we will hold off on stress test evaluation.      Relevant Medications   metolazone (ZAROXOLYN) 2.5 MG tablet   Essential hypertension (Chronic)   Relevant Medications   metolazone (ZAROXOLYN) 2.5 MG tablet   Hyperlipidemia with target LDL less than 70    On simvastatin.  Labs followed by PCP.  Last LDL was in January was 63.  No change.      Relevant Medications   metolazone (ZAROXOLYN) 2.5 MG tablet   Ischemic cardiomyopathy (Chronic)   Relevant Medications   metolazone (ZAROXOLYN) 2.5 MG tablet   Moderate pulmonary arterial systolic hypertension (HCC) (Chronic)    Likely secondary to elevated LVEDP with pulmonary venous hypertension.  Plan: Continue diuresis but will increase intensity by having him take his metolazone at least once a week and ensure that he checks daily weights.  Also talked about the importance of elevating his feet. Reluctant to push afterload reduction because of [place] hypotension to avoid orthostasis.      Relevant Medications   metolazone (ZAROXOLYN) 2.5 MG tablet    Persistent atrial fibrillation (HCC); CHA2DS2Vasc = ~4 (CHF, MI/aortic plaque, Age 67) (Chronic)    Back-up pacer for history of sick sinus syndrome.  Rate seems to be controlled with Toprol. On warfarin.  Doing well.  Would prefer to stay on warfarin as opposed to just to [place].  No plans for rhythm control or restoring sinus rhythm.      Relevant Medications   metolazone (ZAROXOLYN) 2.5 MG tablet      I spent a total of 40minutes with the patient and chart review. >  50% of the time was spent in direct patient consultation.   Current medicines are reviewed at length with the patient today.  (+/- concerns) see below  The following changes have been made:  see below   Patient Instructions  [place]- Wednesday AND Friday  TAKE METOLAZONE PRIOR TO [place]   STARTING NEXT WEEK TAKE EVERY Monday METOLAZONE BEFORE  BUMEX . IF WEIGHT IS UP BY 3 LBS OR MORE MAY USE METOLAZONE WITH BUMEX.    Your physician wants you to follow-up in [place]. You will receive a reminder letter in the mail two months in advance. If you don't receive a letter, please call our office to schedule the follow-up appointment.   If you need a refill on your cardiac medications before your next appointment, please call your pharmacy.     Studies Ordered:   No orders of the defined types were placed in this encounter.     Glenetta Hew, M.D., M.S. Interventional Cardiologist   Pager # [phone] Phone # [phone] [address] [place], [place] [address]   Thank you for choosing [facility] at [facility]!!

## 2017-10-20 NOTE — Patient Instructions (Signed)
MEDICATION CHANGES   FOR THIS WEEK ONLY- Wednesday AND Friday  TAKE METOLAZONE PRIOR TO BUMEX   STARTING NEXT WEEK TAKE EVERY Monday METOLAZONE BEFORE  BUMEX . IF WEIGHT IS UP BY 3 LBS OR MORE MAY USE METOLAZONE WITH BUMEX.    Your physician wants you to follow-up in [place]. You will receive a reminder letter in the mail two months in advance. If you don't receive a letter, please call our office to schedule the follow-up appointment.   If you need a refill on your cardiac medications before your next appointment, please call your pharmacy.

## 2017-10-22 NOTE — Assessment & Plan Note (Addendum)
Probably class II-III baseline symptoms.  He has pretty significant edema today.  I think he needs more diuresis.  JVD is not as prominent, but he does have HJR.  Still has exertional dyspnea.    Plan: He should take his metolazone more frequently.  At least once a week and then as needed for weight gain more than 3 pounds.   Continue current dose of Bumex. Reluctant to further push that the reduction.  He is not currently on ARB or ACE inhibitor because of borderline blood pressures.

## 2017-10-22 NOTE — Assessment & Plan Note (Signed)
Likely secondary to elevated LVEDP with pulmonary venous hypertension.  Plan: Continue diuresis but will increase intensity by having him take his metolazone at least once a week and ensure that he checks daily weights.  Also talked about the importance of elevating his feet. Reluctant to push afterload reduction because of [place] hypotension to avoid orthostasis.

## 2017-10-22 NOTE — Assessment & Plan Note (Signed)
On simvastatin.  Labs followed by PCP.  Last LDL was in January was 63.  No change.

## 2017-10-22 NOTE — Assessment & Plan Note (Signed)
Back-up pacer for history of sick sinus syndrome.  Rate seems to be controlled with Toprol. On warfarin.  Doing well.  Would prefer to stay on warfarin as opposed to just to [place].  No plans for rhythm control or restoring sinus rhythm.

## 2017-10-22 NOTE — Assessment & Plan Note (Signed)
No active angina symptoms. Remains on aspirin, beta-blocker and statin. Besides dyspnea which is more related to CHF, no angina symptoms. Since he seems to stabilized overall with exception of may be some edema, we will hold off on stress test evaluation.

## 2017-10-28 ENCOUNTER — Ambulatory Visit (INDEPENDENT_AMBULATORY_CARE_PROVIDER_SITE_OTHER): Payer: Medicare Other

## 2017-10-28 DIAGNOSIS — Z7901 Long term (current) use of anticoagulants: Secondary | ICD-10-CM | POA: Diagnosis not present

## 2017-10-28 LAB — POCT INR: INR: 2.6 (ref 2.0–3.0)

## 2017-10-28 NOTE — Patient Instructions (Signed)
Take Coumadin 7.5 mg on Monday and Friday. Then Coumadin 6 mg all other days.  Return for INR check on November 26 2017 @ 2:00 pm.

## 2017-10-28 NOTE — Progress Notes (Signed)
Pre visit review using our clinic tool,if applicable. No additional management support is needed unless otherwise documented below in the visit note.   Pt here for INR check per order from Dr. Nani Ravens.  Goal INR =2.0-3.0  Last INR = 1.9  Pt currently takes Coumadin 7.5 mg on Monday and Friday then 6 mg on all other days.  Pt denies recent antibiotics, no dietary changes and no unusual bruising / bleeding.  INR today = 2.6  Per Dr. Charlett Blake patient to continue taking medications as ordered.

## 2017-11-02 ENCOUNTER — Other Ambulatory Visit: Payer: Self-pay | Admitting: Family Medicine

## 2017-11-02 DIAGNOSIS — E785 Hyperlipidemia, unspecified: Secondary | ICD-10-CM

## 2017-11-02 DIAGNOSIS — I480 Paroxysmal atrial fibrillation: Secondary | ICD-10-CM

## 2017-11-04 NOTE — Telephone Encounter (Signed)
Allergy/contraindication alerts populated in attempting to refill simvastatin and warfarin with ASA rx. Routed to Dr. Nani Ravens to advise.

## 2017-11-04 NOTE — Telephone Encounter (Signed)
Warning was for Coumadin w ASA and atorvastatin w simvastatin.

## 2017-11-17 ENCOUNTER — Telehealth: Payer: Self-pay | Admitting: Cardiology

## 2017-11-17 ENCOUNTER — Ambulatory Visit (INDEPENDENT_AMBULATORY_CARE_PROVIDER_SITE_OTHER): Payer: Medicare Other | Admitting: *Deleted

## 2017-11-17 DIAGNOSIS — I255 Ischemic cardiomyopathy: Secondary | ICD-10-CM | POA: Diagnosis not present

## 2017-11-17 NOTE — Telephone Encounter (Signed)
Spoke with pt and reminded pt of remote transmission that is due today. Pt verbalized understanding.   

## 2017-11-19 DIAGNOSIS — L570 Actinic keratosis: Secondary | ICD-10-CM | POA: Diagnosis not present

## 2017-11-19 DIAGNOSIS — C44311 Basal cell carcinoma of skin of nose: Secondary | ICD-10-CM | POA: Diagnosis not present

## 2017-11-19 DIAGNOSIS — X32XXXA Exposure to sunlight, initial encounter: Secondary | ICD-10-CM | POA: Diagnosis not present

## 2017-11-19 NOTE — Progress Notes (Signed)
Remote ICD transmission.   

## 2017-11-25 ENCOUNTER — Telehealth: Payer: Self-pay | Admitting: *Deleted

## 2017-11-25 NOTE — Telephone Encounter (Signed)
Received Physician Orders from [facility] [x3]; forwarded to provider/SLS 10/15

## 2017-11-26 ENCOUNTER — Ambulatory Visit: Payer: Medicare Other

## 2017-11-26 ENCOUNTER — Encounter: Payer: Self-pay | Admitting: Cardiology

## 2017-11-27 ENCOUNTER — Other Ambulatory Visit: Payer: Self-pay | Admitting: Cardiology

## 2017-11-27 ENCOUNTER — Other Ambulatory Visit: Payer: Self-pay | Admitting: Family Medicine

## 2017-12-01 LAB — CUP PACEART REMOTE DEVICE CHECK
Implantable Lead Implant Date: 20080918
Implantable Lead Implant Date: 20150918
Implantable Lead Implant Date: 20150918
Implantable Lead Location: 753858
Implantable Lead Location: 753859
Implantable Lead Location: 753860
Implantable Lead Model: 181
Implantable Lead Model: 5076
Implantable Lead Model: 5076
Implantable Lead Serial Number: 330602
MDC IDC LEAD IMPLANT DT: 20080918
MDC IDC LEAD LOCATION: 753860
MDC IDC PG IMPLANT DT: 20150918
MDC IDC PG SERIAL: 103634
MDC IDC SESS DTM: 20191021170113

## 2017-12-09 ENCOUNTER — Ambulatory Visit: Payer: Medicare Other

## 2017-12-11 ENCOUNTER — Other Ambulatory Visit: Payer: Self-pay

## 2017-12-11 ENCOUNTER — Ambulatory Visit (INDEPENDENT_AMBULATORY_CARE_PROVIDER_SITE_OTHER): Payer: Medicare Other

## 2017-12-11 DIAGNOSIS — Z7901 Long term (current) use of anticoagulants: Secondary | ICD-10-CM

## 2017-12-11 DIAGNOSIS — I480 Paroxysmal atrial fibrillation: Secondary | ICD-10-CM

## 2017-12-11 LAB — POCT INR: INR: 2.4 (ref 2.0–3.0)

## 2017-12-11 MED ORDER — WARFARIN SODIUM 6 MG PO TABS: ORAL_TABLET | ORAL | 1 refills | Status: DC

## 2017-12-11 NOTE — Patient Instructions (Signed)
Patient to continue Coumadin 7.5 mg on Monday and Friday and 6 mg all other days. Return to office for INR per Surgeon on 12/25/17 @ 2:00 pm.   Hold Coumadin 5 days prior to Surgery which will be on the 16th and surgery on the 21 st.

## 2017-12-11 NOTE — Progress Notes (Signed)
Pre visit review using our clinic tool,if applicable. No additional management support is needed unless otherwise documented below in the visit note.   Pt here for INR check per order from Dr. Nani Ravens.  Goal INR = 2.0-3.0  Last INR =2.6  Pt currently takes Coumadin  7.5 mg on Monday and Friday and 6 mg all other days.   Pt denies recent antibiotics, no dietary changes and no unusual bruising / bleeding.  INR today = 2.4  Pt advised per Dr. Nani Ravens to continue same dosage and return in 4 weeks. Patient states he is having surgery on 01/01/18. Patient advised to hold Coumadin 5 days prior to procedure 12/27/17 then resume afterwards. Patient states surgeon wants him to come in for INR prior to his surgery. Appointment scheduled for 12/25/17 @ 2pm. .

## 2017-12-15 ENCOUNTER — Telehealth: Payer: Self-pay | Admitting: *Deleted

## 2017-12-15 NOTE — Telephone Encounter (Signed)
Received Physician Orders from [facility]; forwarded to provider/SLS 11/04

## 2017-12-23 ENCOUNTER — Other Ambulatory Visit: Payer: Self-pay

## 2017-12-23 ENCOUNTER — Emergency Department (HOSPITAL_BASED_OUTPATIENT_CLINIC_OR_DEPARTMENT_OTHER)
Admission: EM | Admit: 2017-12-23 | Discharge: 2017-12-23 | Disposition: A | Discharge status: Home / Self Care | Payer: Medicare Other | Attending: Emergency Medicine | Admitting: Emergency Medicine

## 2017-12-23 ENCOUNTER — Encounter (HOSPITAL_BASED_OUTPATIENT_CLINIC_OR_DEPARTMENT_OTHER): Payer: Self-pay

## 2017-12-23 ENCOUNTER — Emergency Department (HOSPITAL_BASED_OUTPATIENT_CLINIC_OR_DEPARTMENT_OTHER): Payer: Medicare Other

## 2017-12-23 DIAGNOSIS — Z955 Presence of coronary angioplasty implant and graft: Secondary | ICD-10-CM | POA: Diagnosis not present

## 2017-12-23 DIAGNOSIS — Z87891 Personal history of nicotine dependence: Secondary | ICD-10-CM | POA: Insufficient documentation

## 2017-12-23 DIAGNOSIS — Z950 Presence of cardiac pacemaker: Secondary | ICD-10-CM | POA: Diagnosis not present

## 2017-12-23 DIAGNOSIS — I130 Hypertensive heart and chronic kidney disease with heart failure and stage 1 through stage 4 chronic kidney disease, or unspecified chronic kidney disease: Secondary | ICD-10-CM | POA: Insufficient documentation

## 2017-12-23 DIAGNOSIS — Z7982 Long term (current) use of aspirin: Secondary | ICD-10-CM | POA: Diagnosis not present

## 2017-12-23 DIAGNOSIS — Z7901 Long term (current) use of anticoagulants: Secondary | ICD-10-CM | POA: Insufficient documentation

## 2017-12-23 DIAGNOSIS — R0602 Shortness of breath: Secondary | ICD-10-CM | POA: Diagnosis not present

## 2017-12-23 DIAGNOSIS — N183 Chronic kidney disease, stage 3 (moderate): Secondary | ICD-10-CM | POA: Insufficient documentation

## 2017-12-23 DIAGNOSIS — I110 Hypertensive heart disease with heart failure: Secondary | ICD-10-CM | POA: Diagnosis not present

## 2017-12-23 DIAGNOSIS — J449 Chronic obstructive pulmonary disease, unspecified: Secondary | ICD-10-CM | POA: Insufficient documentation

## 2017-12-23 DIAGNOSIS — Z79899 Other long term (current) drug therapy: Secondary | ICD-10-CM | POA: Diagnosis not present

## 2017-12-23 DIAGNOSIS — I5023 Acute on chronic systolic (congestive) heart failure: Secondary | ICD-10-CM | POA: Insufficient documentation

## 2017-12-23 LAB — COMPREHENSIVE METABOLIC PANEL
ALBUMIN: 3.5 g/dL (ref 3.5–5.0)
ALK PHOS: 73 U/L (ref 38–126)
ALT: 14 U/L (ref 0–44)
AST: 22 U/L (ref 15–41)
Anion gap: 8 (ref 5–15)
BUN: 38 mg/dL — AB (ref 8–23)
CALCIUM: 9.9 mg/dL (ref 8.9–10.3)
CHLORIDE: 102 mmol/L (ref 98–111)
CO2: 26 mmol/L (ref 22–32)
CREATININE: 1.84 mg/dL — AB (ref 0.61–1.24)
GFR calc Af Amer: 36 mL/min — ABNORMAL LOW (ref >60)
GFR calc non Af Amer: 31 mL/min — ABNORMAL LOW (ref >60)
GLUCOSE: 108 mg/dL — AB (ref 70–99)
Potassium: 3.4 mmol/L — ABNORMAL LOW (ref 3.5–5.1)
SODIUM: 136 mmol/L (ref 135–145)
Total Bilirubin: 0.8 mg/dL (ref 0.3–1.2)
Total Protein: 7.1 g/dL (ref 6.5–8.1)

## 2017-12-23 LAB — CBC WITH DIFFERENTIAL/PLATELET
Abs Immature Granulocytes: 0.02 10*3/uL (ref 0.00–0.07)
BASOS ABS: 0 10*3/uL (ref 0.0–0.1)
Basophils Relative: 1 %
EOS ABS: 0.3 10*3/uL (ref 0.0–0.5)
Eosinophils Relative: 6 %
HEMATOCRIT: 37.6 % — AB (ref 39.0–52.0)
HEMOGLOBIN: 11.9 g/dL — AB (ref 13.0–17.0)
IMMATURE GRANULOCYTES: 1 %
LYMPHS ABS: 1.1 10*3/uL (ref 0.7–4.0)
LYMPHS PCT: 27 %
MCH: 31.3 pg (ref 26.0–34.0)
MCHC: 31.6 g/dL (ref 30.0–36.0)
MCV: 98.9 fL (ref 80.0–100.0)
MONOS PCT: 10 %
Monocytes Absolute: 0.4 10*3/uL (ref 0.1–1.0)
NEUTROS PCT: 55 %
NRBC: 0 % (ref 0.0–0.2)
Neutro Abs: 2.4 10*3/uL (ref 1.7–7.7)
Platelets: 190 10*3/uL (ref 150–400)
RBC: 3.8 MIL/uL — ABNORMAL LOW (ref 4.22–5.81)
RDW: 14.6 % (ref 11.5–15.5)
WBC: 4.3 10*3/uL (ref 4.0–10.5)

## 2017-12-23 LAB — TROPONIN I: TROPONIN I: 0.12 ng/mL — AB (ref <0.03)

## 2017-12-23 LAB — BRAIN NATRIURETIC PEPTIDE: B Natriuretic Peptide: 716.5 pg/mL — ABNORMAL HIGH (ref 0.0–100.0)

## 2017-12-23 MED ORDER — FUROSEMIDE 10 MG/ML IJ SOLN
60.0000 mg | Freq: Once | INTRAMUSCULAR | Status: AC
  Administered 2017-12-23: 60 mg via INTRAVENOUS
  Filled 2017-12-23: qty 6

## 2017-12-23 NOTE — ED Notes (Signed)
Pt on monitor 

## 2017-12-23 NOTE — ED Notes (Signed)
Pt placed on 2L [place] - pt sleeping on stretcher and O2 stats decreasing to 89%.

## 2017-12-23 NOTE — ED Notes (Signed)
ED Provider at bedside. 

## 2017-12-23 NOTE — ED Provider Notes (Signed)
[name] Provider Note   CSN: [id number] Arrival date & time: 12/23/17  1329     History   Chief Complaint Chief Complaint  Patient presents with  . Shortness of Breath    HPI Kenneth Bell is a 84 y.o. male.  Patient is an 84 year old male with a history of CHF, last echo in January 2019 with an EF of 45 to 50%, chronic kidney disease, COPD, CAD presented today with shortness of breath.  Patient states he always has baseline shortness of breath but since yesterday it has been more pronounced.  He does check his weight regularly and states it is been stable.  He denies to missing any doses of his medication but states he did take a metolazone today which he takes at his discretion when he thinks there may be too much fluid buildup.  He denies cough, fever, nausea, vomiting or abdominal pain.  He has not had any chest pain or medication changes.  The history is provided by the patient.  Shortness of Breath  This is a new problem. Duration: yesterday. The problem occurs continuously.The current episode started yesterday. The problem has not changed since onset.Associated symptoms include leg swelling. Pertinent negatives include no fever, no sore throat, no cough, no sputum production, no wheezing, no PND, no chest pain and no vomiting. It is unknown what precipitated the problem. Treatments tried: took a metolazone today prior to arrival. The treatment provided no relief. He has had prior hospitalizations. He has had prior ED visits. Associated medical issues include COPD and heart failure.    Past Medical History:  Diagnosis Date  . Brain aneurysm   . Chronic combined systolic and diastolic heart failure, NYHA class 3 (HCC)    EF has seemed to vary report-to-report. Myoview 2007 showed EF 30%, echo in 2016 7 EF 60-65% -> Echo in 08/2015 & 08/2016  40-45%  (with basal-mid inferoseptal & apical inferolateral wall), Mod P HTN (PAP ~53 mHg) --most recent in  January 2019 says EF 45-50%.  . CKD (chronic kidney disease), stage III ([place]) 08/26/2016  . COPD (chronic obstructive pulmonary disease) ([place])   . Coronary artery disease involving native heart with angina pectoris ([place])    a. MI 2002 - PCI to [place]. b. 2005: PCI to LAD, OM1 & OM2.  c. s/p CABG in 2013: LIMA to LAD, SVG to OM2, SVG to PDA and SVG to RI. ; d. Cath 2017: occluded LAD, OM1 & OM2, SVG-RI with patent LIMA-LAD, SVG-rPDA & SVG-OM2.   . Essential hypertension   . Hyperlipidemia with target LDL less than 70   . Mild aortic stenosis 08/28/2016   Stenosis with mild regurgitation.  . Moderate pulmonary arterial systolic hypertension ([place]) 08/28/2016   PA pressures F has been estimated at 53-59 mmHg on echo-   . Persistent atrial fibrillation   . Presence of biventricular implantable cardioverter-defibrillator (ICD) 2008  . Thyroid disease     Patient Active Problem List   Diagnosis Date Noted  . Cerebral thrombosis with cerebral infarction 02/21/2017  . Cerebral embolism with cerebral infarction 02/21/2017  . Subarachnoid hemorrhage 02/21/2017  . Confusion 02/20/2017  . Brain aneurysm   . Thyroid disease   . Hyperlipidemia with target LDL less than 70   . Essential hypertension   . Coronary artery disease involving native heart with angina pectoris ([place])   . COPD (chronic obstructive pulmonary disease) ([place])   . Aortic stenosis, mild 09/13/2016  . Ischemic cardiomyopathy 08/28/2016  .  Moderate pulmonary arterial systolic hypertension ([place]) 08/28/2016  . Mild aortic insufficiency 08/28/2016  . Chronic combined systolic and diastolic heart failure, NYHA class 3 ([place]) 08/26/2016  . CKD (chronic kidney disease), stage III ([place]) 08/26/2016  . Persistent atrial fibrillation ([place]); CHA2DS2Vasc = ~4 (CHF, MI/aortic plaque, Age 56) 08/21/2016  . Biventricular ICD (implantable cardioverter-defibrillator) in place 09/13/2012  . Coronary artery disease involving coronary bypass graft of  native heart with angina pectoris ([place]) 08/26/2012  . Presence of biventricular implantable cardioverter-defibrillator (ICD) 02/11/2006    Past Surgical History:  Procedure Laterality Date  . APPENDECTOMY    . CARDIAC CATHETERIZATION  06/2015   Left main patent. Diffuse LAD disease with competitive flow and distal LAD from LIMA. Mid Cx 35% with occlusion of OM 1 and 90% dCx. OM 2 occluded (perfuse via SVG). Ostial RCA 70%, dRCA 100%.  Patent LIMA-LAD. Widely patent SVG-OM, SVG-PDA: 40% proximal. 100% proximal SVG-RI  . CARDIAC SURGERY    . CARPAL TUNNEL RELEASE Right 2017  . COLONOSCOPY  09/05/2012   Colon Polyps  . CORONARY ANGIOPLASTY WITH STENT PLACEMENT     Prior PCI to OM 2; 2005 PCI to LAD and OM1 with a Taxus DES 2.5 x 16 mm (postdilated to 3.8 mm) and PTCA of OM 2 stent  . CORONARY ARTERY BYPASS GRAFT  2013   LIMA-LAD, SVG-L1-2, SVG-PDA, SVG-RI (known occlusion of SVG RI)  . KNEE SURGERY Bilateral 1999  . NM MYOVIEW LTD  04/2015   Prior lateral infarct with no scanning. EF 31% with moderate global HK  . PROSTATE SURGERY    . SHOULDER SURGERY Bilateral    Twice  . TRANSTHORACIC ECHOCARDIOGRAM  08/2015   Moderate LV dysfunction with global HK (estimated at 40 and 45%). Worse anterolateral and inferolateral wall.  . TRANSTHORACIC ECHOCARDIOGRAM  08/2016   EF 40-45%. Moderate concentric LVH. Akinesis of basal-mid inferoseptal and apical inferolateral wall.  Aortic sclerosis, no stenosis. Moderate mitral thickening with mild MR. Moderate RV dilation. Moderate TR and PR. PA pressures 53 mmHg  . TRANSTHORACIC ECHOCARDIOGRAM  02/2017    EF 45-50% (similar to last).  Apical-inferolateral akinesis.  Severe focal basal and moderate concentric LVH.  Mild aortic stenosis.  Elevated PA pressures with peak pressures estimated 59 mmHg (moderate PA hypertension)  . VEIN BYPASS SURGERY  2013        Home Medications    Prior to Admission medications   Medication Sig Start Date End Date  Taking? Authorizing Provider  aspirin EC 81 MG EC tablet Take 1 tablet (81 mg total) by mouth daily. 08/29/16   Dunn, Nedra Hai, PA-C  bumetanide (BUMEX) 1 MG tablet TAKE 2 TABLETS BY MOUTH ONCE DAILY AND 1 AS NEEDED DEPENDING  ON  DAILY  WEIGHT 11/27/17   Leonie Man, MD  cyanocobalamin 1000 MCG tablet Take 1 tablet (1,000 mcg total) by mouth daily. 02/24/17   Modena Jansky, MD  levETIRAcetam (KEPPRA) 500 MG tablet TAKE 1 TABLET BY MOUTH TWICE DAILY 11/27/17   Wendling, Crosby Oyster, DO  levothyroxine (SYNTHROID, LEVOTHROID) 50 MCG tablet TAKE 1 TABLET BY MOUTH ONCE DAILY 08/25/17   Nani Ravens, Crosby Oyster, DO  metolazone (ZAROXOLYN) 2.5 MG tablet TAKE 30 MIN BEFORE  BUMEX EVERY  MONDAY MORNING,MAY TAKE A DOSE IF WEIGHT IS 3 LBS OR GREATER IF  NEEDED 10/20/17   Leonie Man, MD  metoprolol succinate (TOPROL-XL) 25 MG 24 hr tablet TAKE 1 TABLET BY MOUTH ONCE DAILY 08/19/17   [name],  DO  omeprazole (PRILOSEC) 20 MG capsule TAKE 1 CAPSULE BY MOUTH ONCE DAILY 08/04/17   Wendling, Crosby Oyster, DO  potassium chloride (MICRO-K) 10 MEQ CR capsule Take 1 capsule (10 mEq total) by mouth daily. 05/21/17   [name], DO  simvastatin (ZOCOR) 20 MG tablet TAKE 1 TABLET BY MOUTH ONCE DAILY IN THE EVENING 11/04/17   Wendling, Crosby Oyster, DO  warfarin (COUMADIN) 6 MG tablet CONTINUE SAME HOME DOSE: Take 1/2 tablet by mouth on Mondays and Fridays, and 1 whole tablet per day on all other days. 08/28/16   Charlie Pitter, PA-C  warfarin (COUMADIN) 6 MG tablet Take everyday except Mondays and Fridays. On Mondays and Fridays, take 7.5mg  (separate rx). Updated 10/28/17. 12/11/17   [name], DO  warfarin (COUMADIN) 7.5 MG tablet Take 1 tablet (7.5 mg total) by mouth daily. 10/17/17   Debbrah Alar, NP    Family History Family History  Problem Relation Age of Onset  . Arthritis Mother   . Heart disease Father     Social History Social History   Tobacco Use  .  Smoking status: Former Smoker    Types: Cigarettes    Last attempt to quit: 05/30/1967    Years since quitting: 50.6  . Smokeless tobacco: Never Used  Substance Use Topics  . Alcohol use: Yes    Alcohol/week: 6.0 standard drinks    Types: 6 Glasses of wine per week    Comment: weekly  . Drug use: No     Allergies   Lipitor  [atorvastatin] and Nitroglycerin   Review of Systems Review of Systems  Constitutional: Negative for fever.  HENT: Negative for sore throat.   Respiratory: Positive for shortness of breath. Negative for cough, sputum production and wheezing.   Cardiovascular: Positive for leg swelling. Negative for chest pain and PND.  Gastrointestinal: Negative for vomiting.  All other systems reviewed and are negative.    Physical Exam Updated Vital Signs BP (!) 139/91 (BP Location: Right Arm)   Pulse 63   Temp 97.6 F (36.4 C) (Oral)   Resp (!) 24   Ht 5\' 10"  (1.778 m)   Wt 91.2 kg   SpO2 97%   BMI 28.84 kg/m   Physical Exam  Constitutional: He is oriented to person, place, and time. He appears well-developed and well-nourished. No distress.  HENT:  Head: Normocephalic and atraumatic.  Mouth/Throat: Oropharynx is clear and moist.  Eyes: Pupils are equal, round, and reactive to light. Conjunctivae and EOM are normal.  Neck: Normal range of motion. Neck supple.  Cardiovascular: Normal rate, regular rhythm and intact distal pulses.  Murmur heard.  Systolic murmur is present. Pulmonary/Chest: Effort normal. No respiratory distress. He has no wheezes. He has rales in the right lower field.  Pacemaker present in the left chest  Abdominal: Soft. He exhibits no distension. There is no tenderness. There is no rebound and no guarding.  Musculoskeletal: Normal range of motion. He exhibits edema. He exhibits no tenderness.  1+ pitting edema bilateral lower extremities up to the midshin  Neurological: He is alert and oriented to person, place, and time.  Skin: Skin  is warm and dry. Capillary refill takes 2 to 3 seconds. No rash noted. No erythema.  Psychiatric: He has a normal mood and affect. His behavior is normal.  Nursing note and vitals reviewed.    ED Treatments / Results  Labs (all labs ordered are listed, but only abnormal results are displayed) Labs Reviewed  CBC WITH DIFFERENTIAL/PLATELET - Abnormal; Notable for the following components:      Result Value   RBC 3.80 (*)    Hemoglobin 11.9 (*)    HCT 37.6 (*)    All other components within normal limits  BRAIN NATRIURETIC PEPTIDE  TROPONIN I  COMPREHENSIVE METABOLIC PANEL    EKG EKG Interpretation  Date/Time:  Tuesday December 23 2017 14:26:25 EST Ventricular Rate:  60 PR Interval:    QRS Duration: 135 QT Interval:  476 QTC Calculation: 476 R Axis:   -99 Text Interpretation:  Ventricular-paced rhythm No further analysis attempted due to paced rhythm Baseline wander in lead(s) V2 No significant change since last tracing Confirmed by Blanchie Dessert ([id number]) on 12/23/2017 2:36:24 PM   Radiology Dg Chest 2 View  Result Date: 12/23/2017 CLINICAL DATA:  Shortness of breath. EXAM: CHEST - 2 VIEW COMPARISON:  Radiographs of February 11, 2017. FINDINGS: Stable cardiomegaly. Left-sided pacemaker is unchanged in position. Status post coronary artery bypass graft. No pneumothorax is noted. Minimal bilateral pleural effusions are noted. No consolidative process is noted. Bony thorax is unremarkable. IMPRESSION: Minimal bilateral pleural effusions.  Stable cardiomegaly. Electronically Signed   By: Marijo Conception, M.D.   On: 12/23/2017 14:21    Procedures Procedures (including critical care time)  Medications Ordered in ED Medications - No data to display   Initial Impression / Assessment and Plan / ED Course  I have reviewed the triage vital signs and the nursing notes.  Pertinent labs & imaging results that were available during my care of the patient were reviewed by me and  considered in my medical decision making (see chart for details).     Elderly male with multiple cardiac medical problems presenting with shortness of breath.  Patient with a history of CHF last echo was 45 to 50% EF.  Patient denies any chest pain and EKG shows a paced rhythm.  Lower concern for ACS but concern for CHF exacerbation.  Patient has been taking his medications and took metolazone today due to the increased shortness of breath.  Also concern for possible acute kidney injury.  Low suspicion for COPD exacerbation today as patient is not having any wheezing.  Lower suspicion for PE at this time given the nature of the patient's symptoms.  He is satting 97% on room air but is mildly tachypneic at 24.  Also concern for possible anemia.  Labs show a stable hemoglobin of 11, chest x-ray with minimal bilateral pleural effusions and stable cardiomegaly.  Top, CMP and  BNP pending  3:45 PM BNP is elevated to 700 which last one was in the 300s.  Chronic troponin leak which is unchanged at 0.12.  CMP with stable creatinine of 1.8.  Patient given a dose of IV Lasix and will monitor to ensure diuresis and that patient's shortness of breath has improved.  Pt checked out ot Dr. Tyrone Nine  Final Clinical Impressions(s) / ED Diagnoses   Final diagnoses:  None    ED Discharge Orders    None       Blanchie Dessert, MD 12/23/17 1630

## 2017-12-23 NOTE — ED Triage Notes (Signed)
Pt c/o chronic SOB-worse x 2 days-denies flu like sx-NAD-to triage in w/c-stood with cane to scale slow gait

## 2017-12-23 NOTE — ED Provider Notes (Signed)
84 yo M with a chief complaint of shortness of breath.  This is an ongoing issue for him but worsening over the past couple days.  He thinks that he has a little bit more fluid on them than normal.  I see the patient in signout from Dr. Maryan Rued.  His troponin was at his baseline his labs with a creatinine just above his baseline.  Plan was to give him a bolus of Lasix and then ambulate him.  Ambulated at his baseline with O2 SAT >95.  Patient requesting D/c.    Deno Etienne, DO 12/23/17 1745

## 2017-12-24 ENCOUNTER — Telehealth: Payer: Self-pay

## 2017-12-24 NOTE — Telephone Encounter (Signed)
Called patient to see if he needed ED follow up visit. States he does not. Has an appointment tomorrow for INR check.

## 2017-12-25 ENCOUNTER — Ambulatory Visit (INDEPENDENT_AMBULATORY_CARE_PROVIDER_SITE_OTHER): Payer: Medicare Other

## 2017-12-25 DIAGNOSIS — Z7901 Long term (current) use of anticoagulants: Secondary | ICD-10-CM | POA: Diagnosis not present

## 2017-12-25 LAB — POCT INR: INR: 3.3 — AB (ref 2.0–3.0)

## 2017-12-25 NOTE — Progress Notes (Signed)
Noted. Agree with above.  [place], DO 12/25/17 4:47 PM

## 2017-12-25 NOTE — Patient Instructions (Signed)
Take Coumadin 6 mg today then stop. Return for INR after surgery and restarting Coumadin as previously ordered.

## 2017-12-25 NOTE — Addendum Note (Signed)
Addended by: Bunnie Domino on: 12/25/2017 09:36 AM   Modules accepted: Orders

## 2017-12-25 NOTE — Progress Notes (Signed)
Pre visit review using our clinic tool,if applicable. No additional management support is needed unless otherwise documented below in the visit note.   Pt here for INR check per order from Dr. Nani Ravens.  Goal INR =2.0-3.0  Last INR = 2.4  Pt currently takes Coumadin 6 mg and 7.5 mg  Pt denies recent antibiotics, no dietary changes and no unusual bruising / bleeding.  INR today = 3.3  Pt advised per Dr. Nani Ravens take 6 mg Coumadin today then stop. Return after procedure.  Surgery on 01/01/18 patient to stop Coumadin tomorrow.

## 2018-01-01 DIAGNOSIS — C44311 Basal cell carcinoma of skin of nose: Secondary | ICD-10-CM | POA: Diagnosis not present

## 2018-01-12 ENCOUNTER — Ambulatory Visit: Payer: Self-pay | Admitting: *Deleted

## 2018-01-12 NOTE — Telephone Encounter (Signed)
Author phoned pt. to set up OV. Pt. stated he cannot do today, but may be able to see Dr. Nani Ravens on Wednesday for 115PM slot. Pt. States "this is not new", did not sound in distress over the phone. Pt. did say he has noticed 1-4lb weight increase and is taking 2 bumex daily along with metalazone. Per ED instructions given on 11/12, OK for pt. to take up to 3 bumex daily depending on weight. Author encouraged pt. to take the additional one unless stated otherwise by cardiology, and to follow-up with our office and cardiology asap. Next cardiology appointment 02/17/18, pt. made aware. Pt. stated he would check with his daughter and see if he could make the 12/4 appointment with Dr. Nani Ravens. Routed to PCP as FYI.

## 2018-01-12 NOTE — Telephone Encounter (Signed)
Kenneth Bell has SOB he feels is due fluid. He stated he has increased his bumex recently, like he usually does but it hasn't helped.He has good output with the Bumex. Reports his increased SOB is mainly with exertion. Denies CP/fever/no new dizziness/wheezing. Per protocol he should be seen today. No availability for [facility] to schedule x2 days. Routing to PCP for consideration of an appointment.   Reason for Disposition . [1] Longstanding difficulty breathing AND [2] not responding to usual therapy  Answer Assessment - Initial Assessment Questions 1. RESPIRATORY STATUS: "Describe your breathing?" (e.g., wheezing, shortness of breath, unable to speak, severe coughing)      Short of breath for 2 weeks. 2. ONSET: "When did this breathing problem begin?"      2 weeks ago 3. PATTERN "Does the difficult breathing come and go, or has it been constant since it started?"      constant 4. SEVERITY: "How bad is your breathing?" (e.g., mild, moderate, severe)    - MILD: No SOB at rest, mild SOB with walking, speaks normally in sentences, can lay down, no retractions, pulse < 100.    - MODERATE: SOB at rest, SOB with minimal exertion and prefers to sit, cannot lie down flat, speaks in phrases, mild retractions, audible wheezing, pulse 100-120.    - SEVERE: Very SOB at rest, speaks in single words, struggling to breathe, sitting hunched forward, retractions, pulse > 120      Moderate. Worsening lately with activity. 5. RECURRENT SYMPTOM: "Have you had difficulty breathing before?" If so, ask: "When was the last time?" and "What happened that time?"      Yes this is chronic. 6. CARDIAC HISTORY: "Do you have any history of heart disease?" (e.g., heart attack, angina, bypass surgery, angioplasty)      CHF. 7. LUNG HISTORY: "Do you have any history of lung disease?"  (e.g., pulmonary embolus, asthma, emphysema)     no 8. CAUSE: "What do you think is causing the breathing problem?"      fluid 9. OTHER SYMPTOMS:  "Do you have any other symptoms? (e.g., dizziness, runny nose, cough, chest pain, fever)     Slight dizziness intermittently for years.Diarrhea for 3 days. 10. PREGNANCY: "Is there any chance you are pregnant?" "When was your last menstrual period?"       no 11. TRAVEL: "Have you traveled out of the country in the last month?" (e.g., travel history, exposures)       no  Protocols used: BREATHING DIFFICULTY-A-AH

## 2018-01-14 ENCOUNTER — Ambulatory Visit (INDEPENDENT_AMBULATORY_CARE_PROVIDER_SITE_OTHER): Payer: Medicare Other | Admitting: Family Medicine

## 2018-01-14 ENCOUNTER — Encounter: Payer: Self-pay | Admitting: Family Medicine

## 2018-01-14 VITALS — BP 132/72 | HR 60 | Temp 97.4°F | Ht 68.0 in | Wt 199.0 lb

## 2018-01-14 DIAGNOSIS — M79632 Pain in left forearm: Secondary | ICD-10-CM | POA: Diagnosis not present

## 2018-01-14 DIAGNOSIS — R0609 Other forms of dyspnea: Secondary | ICD-10-CM | POA: Diagnosis not present

## 2018-01-14 DIAGNOSIS — M79631 Pain in right forearm: Secondary | ICD-10-CM | POA: Diagnosis not present

## 2018-01-14 DIAGNOSIS — Z7901 Long term (current) use of anticoagulants: Secondary | ICD-10-CM

## 2018-01-14 DIAGNOSIS — R911 Solitary pulmonary nodule: Secondary | ICD-10-CM | POA: Diagnosis not present

## 2018-01-14 LAB — POCT INR: INR: 2.9 (ref 2.0–3.0)

## 2018-01-14 NOTE — Progress Notes (Signed)
Pre visit review using our clinic review tool, if applicable. No additional management support is needed unless otherwise documented below in the visit note. 

## 2018-01-14 NOTE — Addendum Note (Signed)
Addended by: Sharon Seller B on: 01/14/2018 01:54 PM   Modules accepted: Orders

## 2018-01-14 NOTE — Patient Instructions (Addendum)
Wear the splints at night and when you are doing irritating activities to it.   Heat (pad or rice pillow in microwave) over affected area, 10-15 minutes twice daily.   Ice/cold pack over area for 10-15 min twice daily.  For your breathing, stay on what your heart doctor has prescribed. Only take the extra fluid pill if your weight fluctuates. I am not convinced this is the cause of your issue.  Someone will reach out to you regarding your CT scan of your chest. We will be in touch regarding the results.   Wrist and Forearm Exercises Do exercises exactly as told by your health care provider and adjust them as directed. It is normal to feel mild stretching, pulling, tightness, or discomfort as you do these exercises, but you should stop right away if you feel sudden pain or your pain gets worse.   RANGE OF MOTION EXERCISES These exercises warm up your muscles and joints and improve the movement and flexibility of your injured wrist and forearm. These exercises also help to relieve pain, numbness, and tingling. These exercises are done using the muscles in your injured wrist and forearm. Exercise A: Wrist Flexion, Active 1. With your fingers relaxed, bend your wrist forward as far as you can. 2. Hold this position for 30 seconds. Repeat 2 times. Complete this exercise 3 times per week. Exercise B: Wrist Extension, Active 1. With your fingers relaxed, bend your wrist backward as far as you can. 2. Hold this position for 30 seconds. Repeat 2 times. Complete this exercise 3 times per week. Exercise C: Supination, Active  1. Stand or sit with your arms at your sides. 2. Bend your left / right elbow to an "L" shape (90 degrees). 3. Turn your palm upward until you feel a gentle stretch on the inside of your forearm. 4. Hold this position for 30 seconds. 5. Slowly return your palm to the starting position. Repeat 2 times. Complete this exercise 3 times per week. Exercise D: Pronation,  Active  1. Stand or sit with your arms at your sides. 2. Bend your left / right elbow to an "L" shape (90 degrees). 3. Turn your palm downward until you feel a gentle stretch on the top of your forearm. 4. Hold this position for 30 seconds. 5. Slowly return your palm to the starting position. Repeat 2 times. Complete this exercise once a day.  STRETCHING EXERCISES These exercises warm up your muscles and joints and improve the movement and flexibility of your injured wrist and forearm. These exercises also help to relieve pain, numbness, and tingling. These exercises are done using your healthy wrist and forearm to help stretch the muscles in your injured wrist and forearm. Exercise E: Wrist Flexion, Passive  1. Extend your left / right arm in front of you, relax your wrist, and point your fingers downward. 2. Gently push on the back of your hand. Stop when you feel a gentle stretch on the top of your forearm. 3. Hold this position for 30 seconds. Repeat 2 times. Complete this exercise 3 times per week. Exercise F: Wrist Extension, Passive  1. Extend your left / right arm in front of you and turn your palm upward. 2. Gently pull your palm and fingertips back so your fingers point downward. You should feel a gentle stretch on the palm-side of your forearm. 3. Hold this position for 30 seconds. Repeat 2 times. Complete this exercise 3 times per week. Exercise G: Forearm Rotation, Supination, Passive  1. Sit with your left / right elbow bent to an "L" shape (90 degrees) with your forearm resting on a table. 2. Keeping your upper body and shoulder still, use your other hand to rotate your forearm palm-up until you feel a gentle to moderate stretch. 3. Hold this position for 30 seconds. 4. Slowly release the stretch and return to the starting position. Repeat 2 times. Complete this exercise 3 times per week. Exercise H: Forearm Rotation, Pronation, Passive 1. Sit with your left / right elbow  bent to an "L" shape (90 degrees) with your forearm resting on a table. 2. Keeping your upper body and shoulder still, use your other hand to rotate your forearm palm-down until you feel a gentle to moderate stretch. 3. Hold this position for 30 seconds. 4. Slowly release the stretch and return to the starting position. Repeat 2 times. Complete this exercise 3 times per week.  STRENGTHENING EXERCISES These exercises build strength and endurance in your wrist and forearm. Endurance is the ability to use your muscles for a long time, even after they get tired. Exercise I: Wrist Flexors  1. Sit with your left / right forearm supported on a table and your hand resting palm-up over the edge of the table. Your elbow should be bent to an "L" shape (about 90 degrees) and be below the level of your shoulder. 2. Hold a 3-5 lb weight in your left / right hand. Or, hold a rubber exercise band or tube in both hands, keeping your hands at the same level and hip distance apart. There should be a slight tension in the exercise band or tube. 3. Slowly curl your hand up toward your forearm. 4. Hold this position for 3 seconds. 5. Slowly lower your hand back to the starting position. Repeat 2 times. Complete this exercise 3 times per week. Exercise J: Wrist Extensors  1. Sit with your left / right forearm supported on a table and your hand resting palm-down over the edge of the table. Your elbow should be bent to an "L" shape (about 90 degrees) and be below the level of your shoulder. 2. Hold a 3-5 lb weight in your left / right hand. Or, hold a rubber exercise band or tube in both hands, keeping your hands at the same level and hip distance apart. There should be a slight tension in the exercise band or tube. 3. Slowly curl your hand up toward your forearm. 4. Hold this position for 3 seconds. 5. Slowly lower your hand back to the starting position. Repeat 2 times. Complete this exercise 3 times per  week. Exercise K: Forearm Rotation, Supination  1. Sit with your left / right forearm supported on a table and your hand resting palm-down. Your elbow should be at your side, bent to an "L" shape (about 90 degrees), and below the level of your shoulder. Keep your wrist stable and in a neutral position throughout the exercise. 2. Gently hold a lightweight hammer with your left / right hand. 3. Without moving your elbow or wrist, slowly rotate your palm upward to a thumbs-up position. 4. Hold this position for 3 seconds. 5. Slowly return your forearm to the starting position. Repeat 2 times. Complete this exercise 3 times per week. Exercise L: Forearm Rotation, Pronation  1. Sit with your left / right forearm supported on a table and your hand resting palm-up. Your elbow should be at your side, bent to an "L" shape (about 90 degrees), and below  the level of your shoulder. Keep your wrist stable. Do not allow it to move backward or forward during the exercise. 2. Gently hold a lightweight hammer with your left / right hand. 3. Without moving your elbow or wrist, slowly rotate your palm and hand upward to a thumbs-up position. 4. Hold this position for 3 seconds. 5. Slowly return your forearm to the starting position. Repeat 2 times. Complete this exercise 3 times per week. Exercise M: Grip Strengthening  1. Hold one of these items in your left / right hand: play dough, therapy putty, a dense sponge, a stress ball, or a large, rolled sock. 2. Squeeze as hard as you can without increasing pain. 3. Hold this position for 5 seconds. 4. Slowly release your grip. Repeat 2 times. Complete this exercise 3 times per week.  This information is not intended to replace advice given to you by your health care provider. Make sure you discuss any questions you have with your health care provider. Document Released: 12/12/2004 Document Revised: 10/23/2015 Document Reviewed: 10/23/2014 Elsevier Interactive  Patient Education  [employer].

## 2018-01-14 NOTE — Progress Notes (Signed)
Chief Complaint  Patient presents with  . Follow-up    Subjective: Patient is a 84 y.o. male here for sob.  DOE. Walks 100 yards and is completely out of breath. Hx of CHF, on diuretics. No changes in diet or wt changes. He does monitor. No wheezing. Is due for follow up CT scan chest to monitor nodule in RUL. No wheezing or cough. Denies swelling in his legs.   2-3 weeks of b/l UE pain. No inj or change in activity. Has not tried anything at home so far. Had CTS in past, does not remember which side or what it felt like.    ROS: Heart: Denies chest pain  Lungs: Denies current SOB   Past Medical History:  Diagnosis Date  . Brain aneurysm   . Chronic combined systolic and diastolic heart failure, NYHA class 3 (HCC)    EF has seemed to vary report-to-report. Myoview 2007 showed EF 30%, echo in 2016 7 EF 60-65% -> Echo in 08/2015 & 08/2016  40-45%  (with basal-mid inferoseptal & apical inferolateral wall), Mod P HTN (PAP ~53 mHg) --most recent in January 2019 says EF 45-50%.  . CKD (chronic kidney disease), stage III ([place]) 08/26/2016  . COPD (chronic obstructive pulmonary disease) ([place])   . Coronary artery disease involving native heart with angina pectoris ([place])    a. MI 2002 - PCI to [place]. b. 2005: PCI to LAD, OM1 & OM2.  c. s/p CABG in 2013: LIMA to LAD, SVG to OM2, SVG to PDA and SVG to RI. ; d. Cath 2017: occluded LAD, OM1 & OM2, SVG-RI with patent LIMA-LAD, SVG-rPDA & SVG-OM2.   . Essential hypertension   . Hyperlipidemia with target LDL less than 70   . Mild aortic stenosis 08/28/2016   Stenosis with mild regurgitation.  . Moderate pulmonary arterial systolic hypertension ([place]) 08/28/2016   PA pressures F has been estimated at 53-59 mmHg on echo-   . Persistent atrial fibrillation   . Presence of biventricular implantable cardioverter-defibrillator (ICD) 2008  . Thyroid disease     Objective: BP 132/72 (BP Location: Right Arm, Patient Position: Sitting, Cuff Size: Normal)    Pulse 60   Temp (!) 97.4 F (36.3 C) (Oral)   Ht 5\' 8"  (1.727 m)   Wt 199 lb (90.3 kg)   SpO2 94%   BMI 30.26 kg/m  General: Awake, appears stated age HEENT: MMM, EOMi Heart: RRR, +SEM, no LE edema Lungs: CTAB, no rales, wheezes or rhonchi. No accessory muscle use MSK: +TTP over lat epicondyle and wrist extensors b/l, grip strength adequate, wrist strength adequate bl Neuro: DTR's equal and symm in UE's, no clonus, normal sensory function Psych: Age appropriate judgment and insight, normal affect and mood  Assessment and Plan: Pain in both forearms  DOE (dyspnea on exertion)  Solitary pulmonary nodule - Plan: CT Chest W Contrast  #1- stretches/exercises, splints, heat, ice, Tylenol #2- Ck CT chest, I do not think this is related to CHF/fluid overload given his exam and wt.  INR is 2.9 today, stayon 7.5 mg on Mon and Fri, 6 mg/d all other days.  F/u pending above. Will consider referral to pulm vs having him f/u with his cardiologist.  The patient voiced understanding and agreement to the plan.  [place], DO 01/14/18  1:48 PM

## 2018-01-21 ENCOUNTER — Ambulatory Visit (HOSPITAL_BASED_OUTPATIENT_CLINIC_OR_DEPARTMENT_OTHER): Admission: RE | Admit: 2018-01-21 | Payer: Medicare Other | Source: Ambulatory Visit

## 2018-01-22 ENCOUNTER — Encounter: Payer: Self-pay | Admitting: Family Medicine

## 2018-01-22 ENCOUNTER — Ambulatory Visit (HOSPITAL_BASED_OUTPATIENT_CLINIC_OR_DEPARTMENT_OTHER)
Admission: RE | Admit: 2018-01-22 | Discharge: 2018-01-22 | Disposition: A | Discharge status: Home / Self Care | Payer: Medicare Other | Source: Ambulatory Visit | Attending: Family Medicine | Admitting: Family Medicine

## 2018-01-22 ENCOUNTER — Ambulatory Visit: Payer: Self-pay

## 2018-01-22 ENCOUNTER — Encounter (HOSPITAL_BASED_OUTPATIENT_CLINIC_OR_DEPARTMENT_OTHER): Payer: Self-pay

## 2018-01-22 ENCOUNTER — Telehealth: Payer: Self-pay | Admitting: Family Medicine

## 2018-01-22 DIAGNOSIS — R911 Solitary pulmonary nodule: Secondary | ICD-10-CM

## 2018-01-22 DIAGNOSIS — I4819 Other persistent atrial fibrillation: Secondary | ICD-10-CM | POA: Diagnosis not present

## 2018-01-22 DIAGNOSIS — I5043 Acute on chronic combined systolic (congestive) and diastolic (congestive) heart failure: Secondary | ICD-10-CM | POA: Diagnosis not present

## 2018-01-22 DIAGNOSIS — Z66 Do not resuscitate: Secondary | ICD-10-CM | POA: Diagnosis not present

## 2018-01-22 DIAGNOSIS — R0902 Hypoxemia: Secondary | ICD-10-CM | POA: Diagnosis not present

## 2018-01-22 DIAGNOSIS — I130 Hypertensive heart and chronic kidney disease with heart failure and stage 1 through stage 4 chronic kidney disease, or unspecified chronic kidney disease: Secondary | ICD-10-CM | POA: Diagnosis not present

## 2018-01-22 DIAGNOSIS — I110 Hypertensive heart disease with heart failure: Secondary | ICD-10-CM | POA: Diagnosis not present

## 2018-01-22 DIAGNOSIS — R0602 Shortness of breath: Secondary | ICD-10-CM | POA: Diagnosis not present

## 2018-01-22 DIAGNOSIS — R7989 Other specified abnormal findings of blood chemistry: Secondary | ICD-10-CM | POA: Diagnosis not present

## 2018-01-22 DIAGNOSIS — J9601 Acute respiratory failure with hypoxia: Secondary | ICD-10-CM | POA: Diagnosis not present

## 2018-01-22 DIAGNOSIS — I248 Other forms of acute ischemic heart disease: Secondary | ICD-10-CM | POA: Diagnosis not present

## 2018-01-22 DIAGNOSIS — R918 Other nonspecific abnormal finding of lung field: Secondary | ICD-10-CM | POA: Diagnosis not present

## 2018-01-22 MED ORDER — IOPAMIDOL (ISOVUE-300) INJECTION 61%
100.0000 mL | Freq: Once | INTRAVENOUS | Status: AC | PRN
  Administered 2018-01-22: 64 mL via INTRAVENOUS

## 2018-01-22 NOTE — Telephone Encounter (Signed)
Patient is returning call to speak with the nurse or doctor because he stated he wanted to get an appointment first thing tomorrow morning with Dr. Nani Ravens.  Please advise and call patient back as soon as possible.  CB# [phone]

## 2018-01-22 NOTE — Telephone Encounter (Signed)
Received call from [facility]. The patient experiencing SOB .  Unable to lie down/needs to be in a sitting position. PCP reviewed CT result today with the patient . Given order to increase his fluid pill (due to fluid on lungs) until patient is seen by PCP 01/23/18. Dr. Nani Ravens did give verbal orders to take the patient to the ER due to his current symptoms. RN at [facility] agreed to do so.

## 2018-01-22 NOTE — Telephone Encounter (Signed)
Erasmo Downer, RN at [facility] called and says she is with the patient and he had a chest CT today. He was told to call his doctor because of the SOB and he's wanting to know what the report said so he can know what to do. She put the patient on the phone. I asked the patient is he calling because he's SOB. He says "I'm always SOB. I called to get my x-ray report so I can know if something is causing me to be like this." I advised that the results are not available for me to give him, but I will call the office to see if someone can give the results. I called and spoke to Shirlean Mylar, CMA at the office who says she will give the patient the results. I transferred the patient to Shirlean Mylar successfully.   Reason for Disposition . Caller requesting lab results  Protocols used: PCP CALL - NO TRIAGE-A-AH

## 2018-01-23 ENCOUNTER — Ambulatory Visit: Payer: Medicare Other | Admitting: Family Medicine

## 2018-01-23 ENCOUNTER — Encounter (HOSPITAL_COMMUNITY): Payer: Self-pay | Admitting: Emergency Medicine

## 2018-01-23 ENCOUNTER — Observation Stay (HOSPITAL_COMMUNITY): Payer: Medicare Other

## 2018-01-23 ENCOUNTER — Other Ambulatory Visit: Payer: Self-pay

## 2018-01-23 ENCOUNTER — Inpatient Hospital Stay (HOSPITAL_COMMUNITY)
Admission: EM | Admit: 2018-01-23 | Discharge: 2018-01-27 | DRG: 291 | Payer: Medicare Other | Attending: Internal Medicine | Admitting: Internal Medicine

## 2018-01-23 ENCOUNTER — Emergency Department (HOSPITAL_COMMUNITY): Payer: Medicare Other

## 2018-01-23 DIAGNOSIS — I959 Hypotension, unspecified: Secondary | ICD-10-CM | POA: Diagnosis not present

## 2018-01-23 DIAGNOSIS — N179 Acute kidney failure, unspecified: Secondary | ICD-10-CM | POA: Diagnosis present

## 2018-01-23 DIAGNOSIS — I5043 Acute on chronic combined systolic (congestive) and diastolic (congestive) heart failure: Secondary | ICD-10-CM | POA: Diagnosis present

## 2018-01-23 DIAGNOSIS — I509 Heart failure, unspecified: Secondary | ICD-10-CM

## 2018-01-23 DIAGNOSIS — I4819 Other persistent atrial fibrillation: Secondary | ICD-10-CM | POA: Diagnosis present

## 2018-01-23 DIAGNOSIS — J449 Chronic obstructive pulmonary disease, unspecified: Secondary | ICD-10-CM | POA: Diagnosis present

## 2018-01-23 DIAGNOSIS — Z888 Allergy status to other drugs, medicaments and biological substances status: Secondary | ICD-10-CM

## 2018-01-23 DIAGNOSIS — Z7901 Long term (current) use of anticoagulants: Secondary | ICD-10-CM

## 2018-01-23 DIAGNOSIS — Z955 Presence of coronary angioplasty implant and graft: Secondary | ICD-10-CM

## 2018-01-23 DIAGNOSIS — Z95810 Presence of automatic (implantable) cardiac defibrillator: Secondary | ICD-10-CM

## 2018-01-23 DIAGNOSIS — I2510 Atherosclerotic heart disease of native coronary artery without angina pectoris: Secondary | ICD-10-CM | POA: Diagnosis present

## 2018-01-23 DIAGNOSIS — R0689 Other abnormalities of breathing: Secondary | ICD-10-CM | POA: Diagnosis not present

## 2018-01-23 DIAGNOSIS — R0602 Shortness of breath: Secondary | ICD-10-CM | POA: Diagnosis not present

## 2018-01-23 DIAGNOSIS — R0902 Hypoxemia: Secondary | ICD-10-CM

## 2018-01-23 DIAGNOSIS — I10 Essential (primary) hypertension: Secondary | ICD-10-CM | POA: Diagnosis not present

## 2018-01-23 DIAGNOSIS — I110 Hypertensive heart disease with heart failure: Secondary | ICD-10-CM | POA: Diagnosis not present

## 2018-01-23 DIAGNOSIS — Z7982 Long term (current) use of aspirin: Secondary | ICD-10-CM

## 2018-01-23 DIAGNOSIS — I255 Ischemic cardiomyopathy: Secondary | ICD-10-CM | POA: Diagnosis present

## 2018-01-23 DIAGNOSIS — I350 Nonrheumatic aortic (valve) stenosis: Secondary | ICD-10-CM

## 2018-01-23 DIAGNOSIS — Z8673 Personal history of transient ischemic attack (TIA), and cerebral infarction without residual deficits: Secondary | ICD-10-CM

## 2018-01-23 DIAGNOSIS — I252 Old myocardial infarction: Secondary | ICD-10-CM

## 2018-01-23 DIAGNOSIS — Z79899 Other long term (current) drug therapy: Secondary | ICD-10-CM

## 2018-01-23 DIAGNOSIS — I248 Other forms of acute ischemic heart disease: Secondary | ICD-10-CM | POA: Diagnosis present

## 2018-01-23 DIAGNOSIS — I5023 Acute on chronic systolic (congestive) heart failure: Secondary | ICD-10-CM | POA: Diagnosis present

## 2018-01-23 DIAGNOSIS — I130 Hypertensive heart and chronic kidney disease with heart failure and stage 1 through stage 4 chronic kidney disease, or unspecified chronic kidney disease: Principal | ICD-10-CM | POA: Diagnosis present

## 2018-01-23 DIAGNOSIS — J9601 Acute respiratory failure with hypoxia: Secondary | ICD-10-CM | POA: Diagnosis present

## 2018-01-23 DIAGNOSIS — N183 Chronic kidney disease, stage 3 unspecified: Secondary | ICD-10-CM | POA: Diagnosis present

## 2018-01-23 DIAGNOSIS — I272 Pulmonary hypertension, unspecified: Secondary | ICD-10-CM | POA: Diagnosis present

## 2018-01-23 DIAGNOSIS — Z87891 Personal history of nicotine dependence: Secondary | ICD-10-CM

## 2018-01-23 DIAGNOSIS — E785 Hyperlipidemia, unspecified: Secondary | ICD-10-CM | POA: Diagnosis present

## 2018-01-23 DIAGNOSIS — I080 Rheumatic disorders of both mitral and aortic valves: Secondary | ICD-10-CM | POA: Diagnosis present

## 2018-01-23 DIAGNOSIS — R778 Other specified abnormalities of plasma proteins: Secondary | ICD-10-CM | POA: Diagnosis present

## 2018-01-23 DIAGNOSIS — R7989 Other specified abnormal findings of blood chemistry: Secondary | ICD-10-CM

## 2018-01-23 DIAGNOSIS — Z8249 Family history of ischemic heart disease and other diseases of the circulatory system: Secondary | ICD-10-CM

## 2018-01-23 DIAGNOSIS — Z66 Do not resuscitate: Secondary | ICD-10-CM | POA: Diagnosis present

## 2018-01-23 DIAGNOSIS — Z79890 Hormone replacement therapy (postmenopausal): Secondary | ICD-10-CM

## 2018-01-23 DIAGNOSIS — I25119 Atherosclerotic heart disease of native coronary artery with unspecified angina pectoris: Secondary | ICD-10-CM | POA: Diagnosis present

## 2018-01-23 LAB — CBC WITH DIFFERENTIAL/PLATELET
Abs Immature Granulocytes: 0.04 10*3/uL (ref 0.00–0.07)
Basophils Absolute: 0.1 10*3/uL (ref 0.0–0.1)
Basophils Relative: 1 %
Eosinophils Absolute: 0.2 10*3/uL (ref 0.0–0.5)
Eosinophils Relative: 5 %
HCT: 38 % — ABNORMAL LOW (ref 39.0–52.0)
HEMOGLOBIN: 11.5 g/dL — AB (ref 13.0–17.0)
Immature Granulocytes: 1 %
LYMPHS PCT: 21 %
Lymphs Abs: 1 10*3/uL (ref 0.7–4.0)
MCH: 30.6 pg (ref 26.0–34.0)
MCHC: 30.3 g/dL (ref 30.0–36.0)
MCV: 101.1 fL — AB (ref 80.0–100.0)
MONO ABS: 0.5 10*3/uL (ref 0.1–1.0)
MONOS PCT: 12 %
Neutro Abs: 2.8 10*3/uL (ref 1.7–7.7)
Neutrophils Relative %: 60 %
Platelets: 204 10*3/uL (ref 150–400)
RBC: 3.76 MIL/uL — ABNORMAL LOW (ref 4.22–5.81)
RDW: 14.9 % (ref 11.5–15.5)
WBC: 4.6 10*3/uL (ref 4.0–10.5)
nRBC: 0 % (ref 0.0–0.2)

## 2018-01-23 LAB — I-STAT TROPONIN, ED: TROPONIN I, POC: 0.2 ng/mL — AB (ref 0.00–0.08)

## 2018-01-23 LAB — BASIC METABOLIC PANEL
Anion gap: 13 (ref 5–15)
BUN: 43 mg/dL — AB (ref 8–23)
CO2: 25 mmol/L (ref 22–32)
Calcium: 10 mg/dL (ref 8.9–10.3)
Chloride: 103 mmol/L (ref 98–111)
Creatinine, Ser: 2.03 mg/dL — ABNORMAL HIGH (ref 0.61–1.24)
GFR calc Af Amer: 33 mL/min — ABNORMAL LOW (ref >60)
GFR calc non Af Amer: 28 mL/min — ABNORMAL LOW (ref >60)
GLUCOSE: 110 mg/dL — AB (ref 70–99)
POTASSIUM: 3.9 mmol/L (ref 3.5–5.1)
Sodium: 141 mmol/L (ref 135–145)

## 2018-01-23 LAB — PROTIME-INR
INR: 2.71
PROTHROMBIN TIME: 28.4 s — AB (ref 11.4–15.2)

## 2018-01-23 LAB — BRAIN NATRIURETIC PEPTIDE: B NATRIURETIC PEPTIDE 5: 869.8 pg/mL — AB (ref 0.0–100.0)

## 2018-01-23 LAB — TROPONIN I
TROPONIN I: 0.17 ng/mL — AB (ref <0.03)
Troponin I: 0.15 ng/mL (ref <0.03)
Troponin I: 0.15 ng/mL (ref <0.03)

## 2018-01-23 MED ORDER — FUROSEMIDE 10 MG/ML IJ SOLN
40.0000 mg | Freq: Every day | INTRAMUSCULAR | Status: DC
  Administered 2018-01-24: 40 mg via INTRAVENOUS
  Filled 2018-01-23: qty 4

## 2018-01-23 MED ORDER — PANTOPRAZOLE SODIUM 40 MG PO TBEC
40.0000 mg | DELAYED_RELEASE_TABLET | Freq: Every day | ORAL | Status: DC
  Administered 2018-01-23 – 2018-01-27 (×5): 40 mg via ORAL
  Filled 2018-01-23 (×5): qty 1

## 2018-01-23 MED ORDER — SIMVASTATIN 20 MG PO TABS
20.0000 mg | ORAL_TABLET | Freq: Every day | ORAL | Status: DC
  Administered 2018-01-23 – 2018-01-26 (×4): 20 mg via ORAL
  Filled 2018-01-23 (×4): qty 1

## 2018-01-23 MED ORDER — ACETAMINOPHEN 650 MG RE SUPP: 650.0000 mg | Freq: Four times a day (QID) | RECTAL | Status: DC | PRN

## 2018-01-23 MED ORDER — ACETAMINOPHEN 325 MG PO TABS
650.0000 mg | ORAL_TABLET | Freq: Four times a day (QID) | ORAL | Status: DC | PRN
  Administered 2018-01-24: 650 mg via ORAL
  Filled 2018-01-23 (×2): qty 2

## 2018-01-23 MED ORDER — WARFARIN - PHARMACIST DOSING INPATIENT
Freq: Every day | Status: DC
  Administered 2018-01-23: 18:00:00

## 2018-01-23 MED ORDER — LEVOTHYROXINE SODIUM 50 MCG PO TABS
50.0000 ug | ORAL_TABLET | Freq: Every day | ORAL | Status: DC
  Administered 2018-01-23 – 2018-01-27 (×5): 50 ug via ORAL
  Filled 2018-01-23 (×5): qty 1

## 2018-01-23 MED ORDER — LEVETIRACETAM 500 MG PO TABS
500.0000 mg | ORAL_TABLET | Freq: Two times a day (BID) | ORAL | Status: DC
  Administered 2018-01-23 – 2018-01-27 (×9): 500 mg via ORAL
  Filled 2018-01-23 (×9): qty 1

## 2018-01-23 MED ORDER — POTASSIUM CHLORIDE CRYS ER 10 MEQ PO TBCR
10.0000 meq | EXTENDED_RELEASE_TABLET | Freq: Every day | ORAL | Status: DC
  Administered 2018-01-23 – 2018-01-27 (×5): 10 meq via ORAL
  Filled 2018-01-23 (×7): qty 1

## 2018-01-23 MED ORDER — ONDANSETRON HCL 4 MG/2ML IJ SOLN: 4.0000 mg | Freq: Four times a day (QID) | INTRAMUSCULAR | Status: DC | PRN

## 2018-01-23 MED ORDER — FUROSEMIDE 10 MG/ML IJ SOLN
20.0000 mg | Freq: Once | INTRAMUSCULAR | Status: AC
  Administered 2018-01-23: 20 mg via INTRAVENOUS
  Filled 2018-01-23: qty 2

## 2018-01-23 MED ORDER — WARFARIN SODIUM 3 MG PO TABS
3.0000 mg | ORAL_TABLET | Freq: Once | ORAL | Status: AC
  Administered 2018-01-23: 3 mg via ORAL
  Filled 2018-01-23: qty 1

## 2018-01-23 MED ORDER — FUROSEMIDE 10 MG/ML IJ SOLN
40.0000 mg | Freq: Once | INTRAMUSCULAR | Status: AC
  Administered 2018-01-23: 40 mg via INTRAVENOUS
  Filled 2018-01-23: qty 4

## 2018-01-23 MED ORDER — ONDANSETRON HCL 4 MG PO TABS: 4.0000 mg | ORAL_TABLET | Freq: Four times a day (QID) | ORAL | Status: DC | PRN

## 2018-01-23 MED ORDER — ASPIRIN 81 MG PO CHEW
81.0000 mg | CHEWABLE_TABLET | Freq: Every day | ORAL | Status: DC
  Administered 2018-01-23 – 2018-01-27 (×5): 81 mg via ORAL
  Filled 2018-01-23 (×5): qty 1

## 2018-01-23 MED ORDER — ASPIRIN 81 MG PO CHEW
324.0000 mg | CHEWABLE_TABLET | Freq: Once | ORAL | Status: AC
  Administered 2018-01-23: 324 mg via ORAL
  Filled 2018-01-23: qty 4

## 2018-01-23 NOTE — ED Notes (Signed)
Critical troponin result given to Dr. Leonides Schanz 0.17. No new orders at this time.

## 2018-01-23 NOTE — H&P (Signed)
History and Physical    Kenneth Bell NID:[id number] DOB: 07-26-1926 DOA: 01/23/2018  PCP: Kenneth Pal, DO   Patient coming from: [facility].  I have personally briefly reviewed patient's old medical records in [place]  Chief Complaint: Weakness and shortness of breath.  HPI: Kenneth Bell is a 84 y.o. male with medical history significant of brain aneurysm, chronic combined systolic and diastolic heart failure, hypertension, chronic kidney disease stage III, COPD, CAD, hyperlipidemia, mild aortic stenosis and regurgitation, moderate pulmonary hypertension, chronic atrial fibrillation, history of biventricular ICD placement, thyroid disease who is coming to the emergency department due to progressively worse weakness, decreased appetite and shortness of breath following nose surgery last week.  He denies chest pain, palpitations, diaphoresis, pedal edema, but complains of PND, orthopnea.  No fever, chills, sore throat, hemoptysis, wheezing, abdominal pain, nausea, emesis, diarrhea, constipation, melena or hematochezia.  No dysuria, frequency or hematuria.  Denies polyuria, polydipsia, polyphagia or blurred vision.  No skin rashes or pruritus.  ED Course: Initial vital signs temperature 99 F, pulse 67, respirations 16, blood pressure 147/84 mmHg and O2 sat 97% on nasal cannula.  He was given supplemental oxygen and furosemide 40 mg IVP x1 dose.  White count was 4.6, hemoglobin 11.5 g/dL and platelets 204.  PT 28.4 seconds and INR 3.71.  I-STAT troponin was 0.20 and troponin was 0.17 ng/mL.  His BNP was 869.8 pg/mL.  BMP shows normal electrolytes.  Glucose 110, BUN 43 and creatinine 2.03 mg/dL.  Creatinine is slightly higher than his most recent result was 1.84 mg/dL.   Imaging: His chest radiograph showed diffuse reticular and peripheral linear opacities of the lungs probably represent interstitial pulmonary edema with small bilateral pleural effusions and  stable cardiomegaly.  There was no focal consolidation.  Please see images from full radiology report for further detail.  Review of Systems: As per HPI otherwise 10 point review of systems negative.   Past Medical History:  Diagnosis Date  . Brain aneurysm   . Chronic combined systolic and diastolic heart failure, NYHA class 3 (HCC)    EF has seemed to vary report-to-report. Myoview 2007 showed EF 30%, echo in 2016 7 EF 60-65% -> Echo in 08/2015 & 08/2016  40-45%  (with basal-mid inferoseptal & apical inferolateral wall), Mod P HTN (PAP ~53 mHg) --most recent in January 2019 says EF 45-50%.  . CKD (chronic kidney disease), stage III ([place]) 08/26/2016  . COPD (chronic obstructive pulmonary disease) ([place])   . Coronary artery disease involving native heart with angina pectoris ([place])    a. MI 2002 - PCI to [place]. b. 2005: PCI to LAD, OM1 & OM2.  c. s/p CABG in 2013: LIMA to LAD, SVG to OM2, SVG to PDA and SVG to RI. ; d. Cath 2017: occluded LAD, OM1 & OM2, SVG-RI with patent LIMA-LAD, SVG-rPDA & SVG-OM2.   . Essential hypertension   . Hyperlipidemia with target LDL less than 70   . Mild aortic stenosis 08/28/2016   Stenosis with mild regurgitation.  . Moderate pulmonary arterial systolic hypertension ([place]) 08/28/2016   PA pressures F has been estimated at 53-59 mmHg on echo-   . Persistent atrial fibrillation   . Presence of biventricular implantable cardioverter-defibrillator (ICD) 2008  . Thyroid disease     Past Surgical History:  Procedure Laterality Date  . APPENDECTOMY    . CARDIAC CATHETERIZATION  06/2015   Left main patent. Diffuse LAD disease with competitive flow and distal LAD from  LIMA. Mid Cx 35% with occlusion of OM 1 and 90% dCx. OM 2 occluded (perfuse via SVG). Ostial RCA 70%, dRCA 100%.  Patent LIMA-LAD. Widely patent SVG-OM, SVG-PDA: 40% proximal. 100% proximal SVG-RI  . CARDIAC SURGERY    . CARPAL TUNNEL RELEASE Right 2017  . COLONOSCOPY  09/05/2012   Colon Polyps  .  CORONARY ANGIOPLASTY WITH STENT PLACEMENT     Prior PCI to OM 2; 2005 PCI to LAD and OM1 with a Taxus DES 2.5 x 16 mm (postdilated to 3.8 mm) and PTCA of OM 2 stent  . CORONARY ARTERY BYPASS GRAFT  2013   LIMA-LAD, SVG-L1-2, SVG-PDA, SVG-RI (known occlusion of SVG RI)  . KNEE SURGERY Bilateral 1999  . NM MYOVIEW LTD  04/2015   Prior lateral infarct with no scanning. EF 31% with moderate global HK  . PROSTATE SURGERY    . SHOULDER SURGERY Bilateral    Twice  . TRANSTHORACIC ECHOCARDIOGRAM  08/2015   Moderate LV dysfunction with global HK (estimated at 40 and 45%). Worse anterolateral and inferolateral wall.  . TRANSTHORACIC ECHOCARDIOGRAM  08/2016   EF 40-45%. Moderate concentric LVH. Akinesis of basal-mid inferoseptal and apical inferolateral wall.  Aortic sclerosis, no stenosis. Moderate mitral thickening with mild MR. Moderate RV dilation. Moderate TR and PR. PA pressures 53 mmHg  . TRANSTHORACIC ECHOCARDIOGRAM  02/2017    EF 45-50% (similar to last).  Apical-inferolateral akinesis.  Severe focal basal and moderate concentric LVH.  Mild aortic stenosis.  Elevated PA pressures with peak pressures estimated 59 mmHg (moderate PA hypertension)  . VEIN BYPASS SURGERY  2013     reports that he quit smoking about 50 years ago. His smoking use included cigarettes. He has never used smokeless tobacco. He reports current alcohol use of about 6.0 standard drinks of alcohol per week. He reports that he does not use drugs.  Allergies  Allergen Reactions  . Lipitor  [Atorvastatin]   . Nitroglycerin Other (See Comments)    DROPS BLOOD PRESSURE DRASTICALLY Other reaction(s): Other (See Comments) BP drops    Family History  Problem Relation Age of Onset  . Arthritis Mother   . Heart disease Father    Prior to Admission medications   Medication Sig Start Date End Date Taking? Authorizing Provider  aspirin 81 MG chewable tablet Chew 81 mg by mouth daily.   Yes [provider]    bumetanide (BUMEX) 2 MG tablet Take 2 mg by mouth daily.   Yes [provider]  bumetanide (BUMEX) 2 MG tablet Take 2 mg by mouth daily as needed (if weight gain is greater than 8 pounds).   Yes [provider]  cyanocobalamin 1000 MCG tablet Take 1 tablet (1,000 mcg total) by mouth daily. 02/24/17  Yes Hongalgi, Lenis Dickinson, MD  levETIRAcetam (KEPPRA) 500 MG tablet TAKE 1 TABLET BY MOUTH TWICE DAILY Patient taking differently: Take 500 mg by mouth 2 (two) times daily.  11/27/17  Yes Wendling, Crosby Oyster, DO  levothyroxine (SYNTHROID, LEVOTHROID) 50 MCG tablet TAKE 1 TABLET BY MOUTH ONCE DAILY Patient taking differently: Take 50 mcg by mouth daily before breakfast.  08/25/17  Yes Wendling, Crosby Oyster, DO  metoprolol tartrate (LOPRESSOR) 25 MG tablet Take 25 mg by mouth daily.   Yes [provider]  omeprazole (PRILOSEC) 20 MG capsule TAKE 1 CAPSULE BY MOUTH ONCE DAILY Patient taking differently: Take 20 mg by mouth daily.  08/04/17  Yes Kenneth Pal, DO  potassium chloride (K-DUR) 10 MEQ tablet  Take 10 mEq by mouth daily.   Yes [provider]  simvastatin (ZOCOR) 20 MG tablet TAKE 1 TABLET BY MOUTH ONCE DAILY IN THE EVENING Patient taking differently: Take 20 mg by mouth daily.  11/04/17  Yes Kenneth Pal, DO  warfarin (COUMADIN) 6 MG tablet Take everyday except Mondays and Fridays. On Mondays and Fridays, take 7.5mg  (separate rx). Updated 10/28/17. Patient taking differently: Take 3-6 mg by mouth See admin instructions. Take 1/2 tablet on Monday and Friday then take 1 tablet all the other days 12/11/17  Yes Wendling, Crosby Oyster, DO  aspirin EC 81 MG EC tablet Take 1 tablet (81 mg total) by mouth daily. Patient not taking: Reported on 01/23/2018 08/29/16   Charlie Pitter, PA-C  bumetanide (BUMEX) 1 MG tablet TAKE 2 TABLETS BY MOUTH ONCE DAILY AND 1 AS NEEDED DEPENDING  ON  DAILY  WEIGHT Patient not taking: Reported on 01/23/2018 11/27/17    Leonie Man, MD  metolazone (ZAROXOLYN) 2.5 MG tablet TAKE 30 MIN BEFORE  BUMEX EVERY  MONDAY MORNING,MAY TAKE A DOSE IF WEIGHT IS 3 LBS OR GREATER IF  NEEDED Patient not taking: Reported on 01/23/2018 10/20/17   Leonie Man, MD  metoprolol succinate (TOPROL-XL) 25 MG 24 hr tablet TAKE 1 TABLET BY MOUTH ONCE DAILY Patient not taking: Reported on 01/23/2018 08/19/17   Kenneth Pal, DO  potassium chloride (MICRO-K) 10 MEQ CR capsule Take 1 capsule (10 mEq total) by mouth daily. Patient not taking: Reported on 01/23/2018 05/21/17   Kenneth Pal, DO    Physical Exam: Vitals:   01/23/18 0630 01/23/18 0715 01/23/18 0730 01/23/18 0841  BP: (!) 143/91 (!) 147/94 (!) 160/96 134/90  Pulse: (!) 58 (!) 59 (!) 58 60  Resp: (!) 23 18 18 16   Temp:    97.9 F (36.6 C)  TempSrc:    Oral  SpO2: 94% 93% 97% 95%  Weight:      Height:        Constitutional: NAD, calm, comfortable Eyes: PERRL, lids and conjunctivae normal ENMT: Nasal cannula in place.  Mucous membranes are moist. Posterior pharynx clear of any exudate or lesions. Neck: normal, supple, no masses, no thyromegaly Respiratory: Bibasilar crackles, no wheezing, no rhonchi. Normal respiratory effort. No accessory muscle use.  Cardiovascular: Bradycardic 58 bpm, positive 2/6 SEM, no rubs / gallops. No extremity edema. 2+ pedal pulses. No carotid bruits.  Abdomen: Soft, no tenderness, no masses palpated. No hepatosplenomegaly. Bowel sounds positive.  Musculoskeletal: no clubbing / cyanosis. Good ROM, no contractures. Normal muscle tone.  Skin: Areas of ecchymosis on extremities. Neurologic: CN 2-12 grossly intact. Sensation intact, DTR normal. Strength 5/5 in all 4.  Psychiatric: Normal judgment and insight. Alert and oriented x 3. Normal mood.   Labs on Admission: I have personally reviewed following labs and imaging studies  CBC: Recent Labs  Lab 01/23/18 0458  WBC 4.6  NEUTROABS 2.8  HGB 11.5*  HCT 38.0*    MCV 101.1*  PLT 030   Basic Metabolic Panel: Recent Labs  Lab 01/23/18 0458  NA 141  K 3.9  CL 103  CO2 25  GLUCOSE 110*  BUN 43*  CREATININE 2.03*  CALCIUM 10.0   GFR: Estimated Creatinine Clearance: 27 mL/min (A) (by C-G formula based on SCr of 2.03 mg/dL (H)). Liver Function Tests: No results for input(s): AST, ALT, ALKPHOS, BILITOT, PROT, ALBUMIN in the last 168 hours. No results for input(s): LIPASE, AMYLASE in the last 168 hours.  No results for input(s): AMMONIA in the last 168 hours. Coagulation Profile: Recent Labs  Lab 01/23/18 0458  INR 2.71   Cardiac Enzymes: Recent Labs  Lab 01/23/18 0556  TROPONINI 0.17*   BNP (last 3 results) No results for input(s): PROBNP in the last 8760 hours. HbA1C: No results for input(s): HGBA1C in the last 72 hours. CBG: No results for input(s): GLUCAP in the last 168 hours. Lipid Profile: No results for input(s): CHOL, HDL, LDLCALC, TRIG, CHOLHDL, LDLDIRECT in the last 72 hours. Thyroid Function Tests: No results for input(s): TSH, T4TOTAL, FREET4, T3FREE, THYROIDAB in the last 72 hours. Anemia Panel: No results for input(s): VITAMINB12, FOLATE, FERRITIN, TIBC, IRON, RETICCTPCT in the last 72 hours. Urine analysis:    Component Value Date/Time   COLORURINE YELLOW 02/21/2017 0103   APPEARANCEUR CLEAR 02/21/2017 0103   LABSPEC 1.013 02/21/2017 0103   PHURINE 5.0 02/21/2017 0103   GLUCOSEU NEGATIVE 02/21/2017 0103   HGBUR SMALL (A) 02/21/2017 0103   BILIRUBINUR NEGATIVE 02/21/2017 0103   KETONESUR NEGATIVE 02/21/2017 0103   PROTEINUR NEGATIVE 02/21/2017 0103   NITRITE NEGATIVE 02/21/2017 0103   LEUKOCYTESUR NEGATIVE 02/21/2017 0103    Radiological Exams on Admission: Dg Chest 2 View  Result Date: 01/23/2018 CLINICAL DATA:  84 y/o M; weakness, decreased appetite, fever, shortness of breath. EXAM: CHEST - 2 VIEW COMPARISON:  01/22/2018 CT chest. FINDINGS: Stable cardiomegaly given projection and technique. 4 AICD  leads. Aortic atherosclerosis with calcification. Diffuse reticular and peripheral linear opacities of the lungs. Left upper lobe ground-glass nodule described on CT chest is poorly visualized radiographically. Small bilateral effusions. No pneumothorax. No acute osseous abnormality is evident. Post median sternotomy with wires in alignment. IMPRESSION: Diffuse reticular and peripheral linear opacities of the lungs probably representing interstitial pulmonary edema. Small bilateral effusions. Stable cardiomegaly. No focal consolidation. Electronically Signed   By: Kristine Garbe M.D.   On: 01/23/2018 06:04   Ct Chest W Contrast  Result Date: 01/22/2018 CLINICAL DATA:  Followup of lung nodule, history of chronic kidney disease EXAM: CT CHEST WITH CONTRAST TECHNIQUE: Multidetector CT imaging of the chest was performed during intravenous contrast administration. CONTRAST:  53mL ISOVUE-300 IOPAMIDOL (ISOVUE-300) INJECTION 61% COMPARISON:  CT chest of 02/23/2017 FINDINGS: Cardiovascular: Sgnificant thoracic aortic atherosclerotic change is present. Again an aberrant right subclavian artery is noted coursing posterior to the esophagus. The thoracic aorta is not well opacified but no acute abnormality is seen. There is better opacification of the pulmonary arteries. No evidence of pulmonary embolism is seen. Cardiomegaly is present and is stable. Changes of prior CABG are present with median sternotomy sutures noted. Mediastinum/Nodes: No mediastinal or hilar adenopathy is seen. Only small mediastinal lymph nodes are present. There is a rounded low-attenuation nodule in the right thyroid gland of approximately 1.9 cm and ultrasound of the thyroid may be helpful to evaluate further if warranted clinically in this age patient. Lungs/Pleura: The ground-glass opacity within the posterior aspect of the right upper lobe appears slightly larger measuring 22 x 20 x 19 mm compared to 20 x 20 x 17 mm. Also there are  soft tissue components within this ground-glass opacity. Follow-up non-contrast CT recommended at 3-6 months to confirm persistence. If unchanged, and solid component remains <6 mm, annual CT is recommended until 5 years of stability has been established. If persistent these nodules should be considered highly suspicious if the solid component of the nodule is 6 mm or greater in size and enlarging. This recommendation follows the consensus statement:  Guidelines for Management of Incidental Pulmonary Nodules Detected on CT Images: From the Fleischner Society 2017; Radiology 2017; 284:228-243. No additional lung nodule is seen. There are small pleural effusions bilaterally with bibasilar dependent atelectasis as well. Somewhat prominent interstitial markings are noted primarily at the lung bases which may indicate a degree of interstitial edema. There is mild cardiomegaly present. The central airway is patent. Upper Abdomen: There is a simple appearing cyst emanating from the upper pole of the right kidney anteromedially. Also an apparent cyst is noted within the left lobe of liver lateral segment. No other abnormality within the upper abdomen is evident on the limited views obtained. Musculoskeletal: There is significant thoracic kyphoscoliosis present with diffuse degenerative change. No acute compression deformity is seen. IMPRESSION: 1. Slight increase in size of the partly solid ground-glass opacity within the posterior right upper lobe now measuring 2.2 x 2.0 x 1.9 cm compared to 2.0 x 2.0 x 1.7 cm previously. Follow-up non-contrast CT recommended at 3-6 months to confirm persistence. If unchanged, and solid component remains <6 mm, annual CT is recommended until 5 years of stability has been established. If persistent this nodule should be considered highly suspicious if the solid component of the nodule is 6 mm or greater in size and enlarging. This recommendation follows the consensus statement: Guidelines  for Management of Incidental Pulmonary Nodules Detected on CT Images: From the Fleischner Society 2017; Radiology 2017; 284:228-243. 2. Significant thoracic aortic atherosclerosis. Variation of aberrant right subclavian artery. 3. Small effusions and somewhat prominent interstitial markings at the lung bases may indicate a degree of interstitial edema. Electronically Signed   By: Ivar Drape M.D.   On: 01/22/2018 13:21   Echo complete 02/21/2017. ------------------------------------------------------------------- LV EF: 45% -   50%  ------------------------------------------------------------------- History:   PMH:  Stroke 434.91.  Atrial fibrillation.  Coronary artery disease.  Congestive heart failure.  Chronic obstructive pulmonary disease.  Risk factors:  Hypertension. Dyslipidemia.  ------------------------------------------------------------------- Study Conclusions  - Left ventricle: Overall LVF appears mildly reduced at 45-50% with   apical inferolateral akinesis. Wall motion not well visualized   and therefore recommend limited study with definity contrast to   accurately assess for regional wall motion abnormalities. The   cavity size was normal. There was severe focal basal and moderate   concentric hypertrophy. Systolic function was mildly reduced. The   estimated ejection fraction was in the range of 45% to 50%.   Images were inadequate for LV wall motion assessment. - Aortic valve: Trileaflet; moderately thickened, severely   calcified leaflets. Valve mobility was restricted. There was mild   stenosis. Valve area (VTI): 1.1 cm^2. Valve area (Vmax): 1.1   cm^2. Valve area (Vmean): 1.07 cm^2. - Aortic root: The aortic root was mildly dilated. - Left atrium: The atrium was moderately dilated. - Right ventricle: The cavity size was mildly dilated. Wall   thickness was normal. - Right atrium: The atrium was mildly dilated. - Pulmonic valve: There was mild regurgitation. -  Pulmonary arteries: PA peak pressure: 59 mm Hg (S).  Impressions:  - Overall LVF appears mildly reduced at 45-50% with apical   inferolateral akinesis. Wall motion not well visualized and   therefore recommend limited study with definity contrast to   accurately assess for regional wall motion abnormalities. The   right ventricular systolic pressure was increased consistent with   moderate pulmonary hypertension.  EKG: Independently reviewed.  Vent. rate 60 BPM PR interval * ms QRS duration 156 ms QT/QTc 495/495 ms  P-R-T axes * -74 99 VENTRICULAR PACED RHYTHM No significant change since last tracing  Assessment/Plan Principal Problem:   Acute on chronic systolic heart failure (HCC) Observation/telemetry. Continue supplemental oxygen. Continue furosemide 40 mg IVP daily. Monitor daily weights, intake and output. Monitor renal function and electrolytes. Resume metoprolol on Saturday morning once the patient is feeling better. Check echocardiogram.  Active Problems:   Persistent atrial fibrillation (HCC) CHA2DS2Vasc all values forward. Continue warfarin. Continue metoprolol for rate control.    CKD (chronic kidney disease), stage III (HCC) Monitor renal function and electrolytes.    Elevated troponin Has been previously elevated before. Likely due to demand ischemia. Trend troponin level. Check echocardiogram.    Aortic stenosis, mild Recheck echocardiogram.    Hyperlipidemia with target LDL less than 70 Continue simvastatin 20 mg p.o. daily.    Essential hypertension Rest syndrome metoprolol 25 mg p.o. daily oral tomorrow.    Coronary artery disease involving native heart with angina pectoris (HCC) Continue metoprolol, simvastatin and warfarin.    COPD (chronic obstructive pulmonary disease) (HCC) Continue supplemental oxygen. Bronchodilators as needed.    DVT prophylaxis: Warfarin.  Code Status: Full code. Family Communication: His daughter was  present in the ED room.  Disposition Plan: Observation for Consults called:  Admission status: Observation/   Reubin Milan MD [facility] Pager [phone].  If 7PM-7AM, please contact night-coverage [email or web address] Password [email or web address]  01/23/2018, 9:16 AM

## 2018-01-23 NOTE — ED Provider Notes (Addendum)
TIME SEEN: 5:12 AM  CHIEF COMPLAINT: Shortness of breath  HPI: Patient is an 84 year old male with history of CHF status post pacemaker/defibrillator, chronic kidney disease, COPD, CAD status post 7 stents, hypertension, hyperlipidemia, pulmonary hypertension, atrial fibrillation on Coumadin who presents to the emergency department from his nursing home at [facility] with shortness of breath that started tonight.  Does not wear oxygen chronically.  Was found to have sats of 84% with EMS.  No fevers.  No cough.  No chest pain or chest discomfort.  No increased lower extremity swelling from baseline.  States he has never had a heart attack.  Feeling better on oxygen.  ROS: See HPI Constitutional: no fever  Eyes: no drainage  ENT: no runny nose   Cardiovascular:  no chest pain  Resp: SOB  GI: no vomiting GU: no dysuria Integumentary: no rash  Allergy: no hives  Musculoskeletal: no leg swelling  Neurological: no slurred speech ROS otherwise negative  PAST MEDICAL HISTORY/PAST SURGICAL HISTORY:  Past Medical History:  Diagnosis Date  . Brain aneurysm   . Chronic combined systolic and diastolic heart failure, NYHA class 3 (HCC)    EF has seemed to vary report-to-report. Myoview 2007 showed EF 30%, echo in 2016 7 EF 60-65% -> Echo in 08/2015 & 08/2016  40-45%  (with basal-mid inferoseptal & apical inferolateral wall), Mod P HTN (PAP ~53 mHg) --most recent in January 2019 says EF 45-50%.  . CKD (chronic kidney disease), stage III ([place]) 08/26/2016  . COPD (chronic obstructive pulmonary disease) ([place])   . Coronary artery disease involving native heart with angina pectoris ([place])    a. MI 2002 - PCI to [place]. b. 2005: PCI to LAD, OM1 & OM2.  c. s/p CABG in 2013: LIMA to LAD, SVG to OM2, SVG to PDA and SVG to RI. ; d. Cath 2017: occluded LAD, OM1 & OM2, SVG-RI with patent LIMA-LAD, SVG-rPDA & SVG-OM2.   . Essential hypertension   . Hyperlipidemia with target LDL less than 70   . Mild aortic stenosis  08/28/2016   Stenosis with mild regurgitation.  . Moderate pulmonary arterial systolic hypertension ([place]) 08/28/2016   PA pressures F has been estimated at 53-59 mmHg on echo-   . Persistent atrial fibrillation   . Presence of biventricular implantable cardioverter-defibrillator (ICD) 2008  . Thyroid disease     MEDICATIONS:  Prior to Admission medications   Medication Sig Start Date End Date Taking? Authorizing Provider  aspirin EC 81 MG EC tablet Take 1 tablet (81 mg total) by mouth daily. 08/29/16   Dunn, Nedra Hai, PA-C  bumetanide (BUMEX) 1 MG tablet TAKE 2 TABLETS BY MOUTH ONCE DAILY AND 1 AS NEEDED DEPENDING  ON  DAILY  WEIGHT 11/27/17   Leonie Man, MD  cyanocobalamin 1000 MCG tablet Take 1 tablet (1,000 mcg total) by mouth daily. 02/24/17   Modena Jansky, MD  levETIRAcetam (KEPPRA) 500 MG tablet TAKE 1 TABLET BY MOUTH TWICE DAILY 11/27/17   Wendling, Crosby Oyster, DO  levothyroxine (SYNTHROID, LEVOTHROID) 50 MCG tablet TAKE 1 TABLET BY MOUTH ONCE DAILY 08/25/17   Nani Ravens, Crosby Oyster, DO  metolazone (ZAROXOLYN) 2.5 MG tablet TAKE 30 MIN BEFORE  BUMEX EVERY  MONDAY MORNING,MAY TAKE A DOSE IF WEIGHT IS 3 LBS OR GREATER IF  NEEDED 10/20/17   Leonie Man, MD  metoprolol succinate (TOPROL-XL) 25 MG 24 hr tablet TAKE 1 TABLET BY MOUTH ONCE DAILY 08/19/17   [name], DO  omeprazole ([place]) 20  MG capsule TAKE 1 CAPSULE BY MOUTH ONCE DAILY 08/04/17   [name], DO  potassium chloride (MICRO-K) 10 MEQ CR capsule Take 1 capsule (10 mEq total) by mouth daily. Patient taking differently: Take 10 mEq by mouth 2 (two) times daily.  05/21/17   [name], DO  simvastatin (ZOCOR) 20 MG tablet TAKE 1 TABLET BY MOUTH ONCE DAILY IN THE EVENING 11/04/17   [name], DO  warfarin (COUMADIN) 6 MG tablet Take everyday except Mondays and Fridays. On Mondays and Fridays, take 7.5mg  (separate rx). Updated 10/28/17. Patient taking differently: Take  everyday except Mondays and Fridays. On Mondays and Fridays, take 7.5mg  (separate rx). Updated 10/28/17. 12/11/17   [name], DO  warfarin (COUMADIN) 7.5 MG tablet Take 1 tab on Monday and 1 tab on Friday. 01/14/18   [name], DO    ALLERGIES:  Allergies  Allergen Reactions  . Lipitor  [Atorvastatin]   . Nitroglycerin Other (See Comments)    DROPS BLOOD PRESSURE DRASTICALLY Other reaction(s): Other (See Comments) BP drops    SOCIAL HISTORY:  Social History   Tobacco Use  . Smoking status: Former Smoker    Types: Cigarettes    Last attempt to quit: 05/30/1967    Years since quitting: 50.6  . Smokeless tobacco: Never Used  Substance Use Topics  . Alcohol use: Yes    Alcohol/week: 6.0 standard drinks    Types: 6 Glasses of wine per week    Comment: weekly    FAMILY HISTORY: Family History  Problem Relation Age of Onset  . Arthritis Mother   . Heart disease Father     EXAM: BP 133/88   Pulse (!) 57   Temp 99 F (37.2 C) (Rectal)   Resp 20   Ht 5\' 8"  (1.727 m)   Wt 91 kg   SpO2 94%   BMI 30.50 kg/m  CONSTITUTIONAL: Alert and oriented and responds appropriately to questions. Well-appearing; well-nourished, elderly, in no distress HEAD: Normocephalic EYES: Conjunctivae clear, pupils appear equal, EOMI ENT: normal nose; moist mucous membranes; scabbed lesion to the end of his nose with mild amount of surrounding redness but no drainage from skin cancer recently removed  NECK: Supple, no meningismus, no nuchal rigidity, no LAD  CARD: RRR; S1 and S2 appreciated; no murmurs, no clicks, no rubs, no gallops RESP: Normal chest excursion without splinting or tachypnea; breath sounds clear and equal bilaterally; no wheezes, no rhonchi, no rales, no hypoxia or respiratory distress, speaking full sentences ABD/GI: Normal bowel sounds; non-distended; soft, non-tender, no rebound, no guarding, no peritoneal signs, no hepatosplenomegaly BACK:  The back  appears normal and is non-tender to palpation, there is no CVA tenderness EXT: Normal ROM in all joints; non-tender to palpation; mild edema in bilateral LE; normal capillary refill; no cyanosis, no calf tenderness or swelling    SKIN: Normal color for age and race; warm; no rash NEURO: Moves all extremities equally PSYCH: The patient's mood and manner are appropriate. Grooming and personal hygiene are appropriate.  MEDICAL DECISION MAKING: Patient here with shortness of breath, new onset oxygen requirement.  Doing well on 2 L nasal cannula here.  Currently his lungs are clear but he does appear volume overloaded.  Differential includes COPD, CHF, ACS.  EKG shows paced rhythm.  Will interrogate his pacemaker/defibrillator.  Will obtain labs, chest x-ray.  Normal rectal temperature here of 99.  No other signs or symptoms of sepsis.  ED PROGRESS: Patient's labs show elevated  troponin but this is in the setting of chronic kidney disease which is stable.  He has had elevated troponin in January 2019.  Chest x-ray shows interstitial edema.  Will admit for IV diuresis and monitoring of his troponins.  Patient does have a new oxygen requirement.    6:20 AM Discussed patient's case with hospitalist, Dr. Alcario Drought.  I have recommended admission and patient (and family if present) agree with this plan. Admitting physician will place admission orders.   I reviewed all nursing notes, vitals, pertinent previous records, EKGs, lab and urine results, imaging (as available).     EKG Interpretation  Date/Time:  Friday January 23 2018 04:48:49 EST Ventricular Rate:  60 PR Interval:    QRS Duration: 156 QT Interval:  495 QTC Calculation: 495 R Axis:   -74 Text Interpretation:  Confirmed by Pryor Curia [phone]) on 01/23/2018 5:11:05 AM         Ikeya Brockel, Delice Bison, DO 01/23/18 0620    Guadalupe Kerekes, Delice Bison, DO 01/23/18 [id number]

## 2018-01-23 NOTE — ED Triage Notes (Signed)
BIB [facility] from [facility] with c/o of weakness, decreased apetite, fevers and shortness of breath following nose surgery last week. 88% on RA placed on 6L with EMS.

## 2018-01-23 NOTE — Progress Notes (Signed)
ANTICOAGULATION CONSULT NOTE - Follow Up Consult  Pharmacy Consult for Warfarin Indication: atrial fibrillation  Allergies  Allergen Reactions  . Lipitor  [Atorvastatin]   . Nitroglycerin Other (See Comments)    DROPS BLOOD PRESSURE DRASTICALLY Other reaction(s): Other (See Comments) BP drops    Patient Measurements: Height: 5\' 8"  (172.7 cm) Weight: 200 lb 9.9 oz (91 kg) IBW/kg (Calculated) : 68.4   Vital Signs: Temp: 97.9 F (36.6 C) (12/13 0841) Temp Source: Oral (12/13 0841) BP: 134/90 (12/13 0841) Pulse Rate: 60 (12/13 0841)  Labs: Recent Labs    01/23/18 0458 01/23/18 0556  HGB 11.5*  --   HCT 38.0*  --   PLT 204  --   LABPROT 28.4*  --   INR 2.71  --   CREATININE 2.03*  --   TROPONINI  --  0.17*    Estimated Creatinine Clearance: 27 mL/min (A) (by C-G formula based on SCr of 2.03 mg/dL (H)).  Assessment: 84 year old male on warfarin prior to admission for Afib INR on admission therapeutic at 2.71 Dose prior to admission 3 mg Monday and Friday, 6 mg other days  Goal of Therapy:  INR 2-3 Monitor platelets by anticoagulation protocol: Yes   Plan:  Warfarin 3 mg po x 1 dose tonight Daily INR  Thank you Anette Guarneri, PharmD [phone]  01/23/2018,9:52 AM

## 2018-01-23 NOTE — Telephone Encounter (Signed)
I'm told that pt is in ED.

## 2018-01-23 NOTE — ED Notes (Signed)
Attempted to interrogate pt's [facility] pacemaker 3 times unsuccessfully. Machine not connecting in order to send information. EDP notified.

## 2018-01-24 ENCOUNTER — Observation Stay (HOSPITAL_BASED_OUTPATIENT_CLINIC_OR_DEPARTMENT_OTHER): Payer: Medicare Other

## 2018-01-24 DIAGNOSIS — I340 Nonrheumatic mitral (valve) insufficiency: Secondary | ICD-10-CM | POA: Diagnosis not present

## 2018-01-24 DIAGNOSIS — R7989 Other specified abnormal findings of blood chemistry: Secondary | ICD-10-CM | POA: Diagnosis not present

## 2018-01-24 DIAGNOSIS — J9601 Acute respiratory failure with hypoxia: Secondary | ICD-10-CM | POA: Diagnosis present

## 2018-01-24 DIAGNOSIS — I350 Nonrheumatic aortic (valve) stenosis: Secondary | ICD-10-CM

## 2018-01-24 DIAGNOSIS — J410 Simple chronic bronchitis: Secondary | ICD-10-CM | POA: Diagnosis not present

## 2018-01-24 DIAGNOSIS — I4819 Other persistent atrial fibrillation: Secondary | ICD-10-CM | POA: Diagnosis not present

## 2018-01-24 DIAGNOSIS — I361 Nonrheumatic tricuspid (valve) insufficiency: Secondary | ICD-10-CM | POA: Diagnosis not present

## 2018-01-24 DIAGNOSIS — I5023 Acute on chronic systolic (congestive) heart failure: Secondary | ICD-10-CM | POA: Diagnosis not present

## 2018-01-24 LAB — ECHOCARDIOGRAM COMPLETE
Height: 68 in
Weight: 3209.9 oz

## 2018-01-24 LAB — PROTIME-INR
INR: 2.91
PROTHROMBIN TIME: 30 s — AB (ref 11.4–15.2)

## 2018-01-24 LAB — BASIC METABOLIC PANEL
ANION GAP: 10 (ref 5–15)
BUN: 41 mg/dL — ABNORMAL HIGH (ref 8–23)
CO2: 29 mmol/L (ref 22–32)
Calcium: 9.8 mg/dL (ref 8.9–10.3)
Chloride: 102 mmol/L (ref 98–111)
Creatinine, Ser: 2 mg/dL — ABNORMAL HIGH (ref 0.61–1.24)
GFR calc Af Amer: 33 mL/min — ABNORMAL LOW (ref >60)
GFR, EST NON AFRICAN AMERICAN: 29 mL/min — AB (ref >60)
Glucose, Bld: 106 mg/dL — ABNORMAL HIGH (ref 70–99)
Potassium: 3.9 mmol/L (ref 3.5–5.1)
Sodium: 141 mmol/L (ref 135–145)

## 2018-01-24 MED ORDER — FUROSEMIDE 10 MG/ML IJ SOLN
40.0000 mg | Freq: Once | INTRAMUSCULAR | Status: AC
  Administered 2018-01-24: 40 mg via INTRAVENOUS
  Filled 2018-01-24: qty 4

## 2018-01-24 MED ORDER — FUROSEMIDE 10 MG/ML IJ SOLN
80.0000 mg | Freq: Two times a day (BID) | INTRAMUSCULAR | Status: DC
  Administered 2018-01-24 – 2018-01-25 (×3): 80 mg via INTRAVENOUS
  Filled 2018-01-24 (×3): qty 8

## 2018-01-24 MED ORDER — METOLAZONE 2.5 MG PO TABS
5.0000 mg | ORAL_TABLET | Freq: Every day | ORAL | Status: DC
  Administered 2018-01-24 – 2018-01-25 (×2): 5 mg via ORAL
  Filled 2018-01-24: qty 2
  Filled 2018-01-24 (×3): qty 1
  Filled 2018-01-24: qty 2

## 2018-01-24 MED ORDER — WARFARIN SODIUM 3 MG PO TABS
3.0000 mg | ORAL_TABLET | Freq: Once | ORAL | Status: AC
  Administered 2018-01-24: 3 mg via ORAL
  Filled 2018-01-24 (×2): qty 1

## 2018-01-24 NOTE — H&P (Signed)
History and Physical    Kenneth Bell GYI:[id number] DOB: 19-Sep-1926 DOA: 01/23/2018  PCP: [name], DO  Patient coming from: Observation status to inpatient  I have personally briefly reviewed patient's old medical records in [place]  Chief Complaint: Shortness of breath with hypoxia  HPI: Kenneth Bell is an 84 year old male with a medical history significant for brain aneurysm, chronic combined systolic and diastolic heart failure, hypertension, chronic kidney disease stage III, COPD, CAD, hyperlipidemia, mild aortic stenosis and regurgitation, moderate pulmonary hypertension, chronic atrial fibrillation, history of biventricular ICD placement, thyroid disease who is coming to the emergency department due to progressively worse weakness, decreased appetite and shortness of breath following nose surgery last week.  He denies chest pain, palpitations, diaphoresis, pedal edema, but complains of PND, orthopnea.  No fever, chills, sore throat, hemoptysis, wheezing, abdominal pain, nausea, emesis, diarrhea, constipation, melena or hematochezia.  No dysuria, frequency or hematuria.  Denies polyuria, polydipsia, polyphagia or blurred vision.  No skin rashes or pruritus.  ED Course: Initial vital signs temperature 99 F, pulse 67, respirations 16, blood pressure 147/84 mmHg and O2 sat 97% on nasal cannula.  He was given supplemental oxygen and furosemide 40 mg IVP x1 dose. White count was 4.6, hemoglobin 11.5 g/dL and platelets 204.  PT 28.4 seconds and INR 3.71.  I-STAT troponin was 0.20 and troponin was 0.17 ng/mL.  His BNP was 869.8 pg/mL.  BMP shows normal electrolytes.  Glucose 110, BUN 43 and creatinine 2.03 mg/dL.  Creatinine is slightly higher than his most recent result was 1.84 mg/dL.   Imaging: His chest radiograph showed diffuse reticular and peripheral linear opacities of the lungs probably represent interstitial pulmonary edema with small bilateral pleural  effusions and stable cardiomegaly.  There was no focal consolidation.  Please see images from full radiology report for further detail.  Observation course: Despite diuresis the patient continues to require oxygen and in fact had desaturations with ambulation requiring oxygen.  He is also requiring IV Lasix for treatment of his pulmonary edema.  He continues to have edema on examination.  His echocardiogram shows a worsening ejection fraction and progression of his mild aortic stenosis to moderate to severe.  Therefore being admitted to the hospital for further evaluation management.   Review of Systems: As per HPI otherwise all other systems reviewed and  negative.    Past Medical History:  Diagnosis Date  . Brain aneurysm   . Chronic combined systolic and diastolic heart failure, NYHA class 3 (HCC)    EF has seemed to vary report-to-report. Myoview 2007 showed EF 30%, echo in 2016 7 EF 60-65% -> Echo in 08/2015 & 08/2016  40-45%  (with basal-mid inferoseptal & apical inferolateral wall), Mod P HTN (PAP ~53 mHg) --most recent in January 2019 says EF 45-50%.  . CKD (chronic kidney disease), stage III ([place]) 08/26/2016  . COPD (chronic obstructive pulmonary disease) ([place])   . Coronary artery disease involving native heart with angina pectoris ([place])    a. MI 2002 - PCI to [place]. b. 2005: PCI to LAD, OM1 & OM2.  c. s/p CABG in 2013: LIMA to LAD, SVG to OM2, SVG to PDA and SVG to RI. ; d. Cath 2017: occluded LAD, OM1 & OM2, SVG-RI with patent LIMA-LAD, SVG-rPDA & SVG-OM2.   . Essential hypertension   . Hyperlipidemia with target LDL less than 70   . Mild aortic stenosis 08/28/2016   Stenosis with mild regurgitation.  . Moderate pulmonary arterial systolic hypertension (  [place]) 08/28/2016   PA pressures F has been estimated at 53-59 mmHg on echo-   . Persistent atrial fibrillation   . Presence of biventricular implantable cardioverter-defibrillator (ICD) 2008  . Thyroid disease     Past Surgical  History:  Procedure Laterality Date  . APPENDECTOMY    . CARDIAC CATHETERIZATION  06/2015   Left main patent. Diffuse LAD disease with competitive flow and distal LAD from LIMA. Mid Cx 35% with occlusion of OM 1 and 90% dCx. OM 2 occluded (perfuse via SVG). Ostial RCA 70%, dRCA 100%.  Patent LIMA-LAD. Widely patent SVG-OM, SVG-PDA: 40% proximal. 100% proximal SVG-RI  . CARDIAC SURGERY    . CARPAL TUNNEL RELEASE Right 2017  . COLONOSCOPY  09/05/2012   Colon Polyps  . CORONARY ANGIOPLASTY WITH STENT PLACEMENT     Prior PCI to OM 2; 2005 PCI to LAD and OM1 with a Taxus DES 2.5 x 16 mm (postdilated to 3.8 mm) and PTCA of OM 2 stent  . CORONARY ARTERY BYPASS GRAFT  2013   LIMA-LAD, SVG-L1-2, SVG-PDA, SVG-RI (known occlusion of SVG RI)  . KNEE SURGERY Bilateral 1999  . NM MYOVIEW LTD  04/2015   Prior lateral infarct with no scanning. EF 31% with moderate global HK  . PROSTATE SURGERY    . SHOULDER SURGERY Bilateral    Twice  . TRANSTHORACIC ECHOCARDIOGRAM  08/2015   Moderate LV dysfunction with global HK (estimated at 40 and 45%). Worse anterolateral and inferolateral wall.  . TRANSTHORACIC ECHOCARDIOGRAM  08/2016   EF 40-45%. Moderate concentric LVH. Akinesis of basal-mid inferoseptal and apical inferolateral wall.  Aortic sclerosis, no stenosis. Moderate mitral thickening with mild MR. Moderate RV dilation. Moderate TR and PR. PA pressures 53 mmHg  . TRANSTHORACIC ECHOCARDIOGRAM  02/2017    EF 45-50% (similar to last).  Apical-inferolateral akinesis.  Severe focal basal and moderate concentric LVH.  Mild aortic stenosis.  Elevated PA pressures with peak pressures estimated 59 mmHg (moderate PA hypertension)  . VEIN BYPASS SURGERY  2013    Social History   Social History Narrative   He has been married for 30 years. They have 5 children and 10 grandchildren. 12 great-grandchildren.   He recently moved to [facility], [place] and oriented close to his daughter here. His wife has been moved into  an Alzheimer's/memory unit nearby.   He currently lives with his daughter.   He has a college degree and previously worked as a self-employed Materials engineer.   He himself quit driving couple years ago because he was concerned about his reaction time. He is therefore depend upon his family or public transportation.   He quit smoking in 1969 having smoked for 20 years.   He drinks maybe 4-7 drinks alcohol/wine a week.   He does not exercise any longer because of shortness of breath and lack of energy.     reports that he quit smoking about 50 years ago. His smoking use included cigarettes. He has never used smokeless tobacco. He reports current alcohol use of about 6.0 standard drinks of alcohol per week. He reports that he does not use drugs.  Allergies  Allergen Reactions  . Lipitor  [Atorvastatin]   . Nitroglycerin Other (See Comments)    DROPS BLOOD PRESSURE DRASTICALLY Other reaction(s): Other (See Comments) BP drops    Family History  Problem Relation Age of Onset  . Arthritis Mother   . Heart disease Father      Prior to Admission medications  Medication Sig Start Date End Date Taking? Authorizing Provider  aspirin 81 MG chewable tablet Chew 81 mg by mouth daily.   Yes [provider]  bumetanide (BUMEX) 2 MG tablet Take 2 mg by mouth daily.   Yes [provider]  bumetanide (BUMEX) 2 MG tablet Take 2 mg by mouth daily as needed (if weight gain is greater than 8 pounds).   Yes [provider]  cyanocobalamin 1000 MCG tablet Take 1 tablet (1,000 mcg total) by mouth daily. 02/24/17  Yes Hongalgi, Lenis Dickinson, MD  levETIRAcetam (KEPPRA) 500 MG tablet TAKE 1 TABLET BY MOUTH TWICE DAILY Patient taking differently: Take 500 mg by mouth 2 (two) times daily.  11/27/17  Yes Wendling, Crosby Oyster, DO  levothyroxine (SYNTHROID, LEVOTHROID) 50 MCG tablet TAKE 1 TABLET BY MOUTH ONCE DAILY Patient taking differently: Take 50 mcg by mouth daily before  breakfast.  08/25/17  Yes Wendling, Crosby Oyster, DO  metoprolol tartrate (LOPRESSOR) 25 MG tablet Take 25 mg by mouth daily.   Yes [provider]  omeprazole (PRILOSEC) 20 MG capsule TAKE 1 CAPSULE BY MOUTH ONCE DAILY Patient taking differently: Take 20 mg by mouth daily.  08/04/17  Yes [name], DO  potassium chloride (K-DUR) 10 MEQ tablet Take 10 mEq by mouth daily.   Yes [provider]  simvastatin (ZOCOR) 20 MG tablet TAKE 1 TABLET BY MOUTH ONCE DAILY IN THE EVENING Patient taking differently: Take 20 mg by mouth daily.  11/04/17  Yes [name], DO  warfarin (COUMADIN) 6 MG tablet Take everyday except Mondays and Fridays. On Mondays and Fridays, take 7.5mg  (separate rx). Updated 10/28/17. Patient taking differently: Take 3-6 mg by mouth See admin instructions. Take 1/2 tablet on Monday and Friday then take 1 tablet all the other days 12/11/17  Yes Wendling, Crosby Oyster, DO  bumetanide (BUMEX) 1 MG tablet TAKE 2 TABLETS BY MOUTH ONCE DAILY AND 1 AS NEEDED DEPENDING  ON  DAILY  WEIGHT Patient not taking: Reported on 01/23/2018 11/27/17   Leonie Man, MD  metolazone (ZAROXOLYN) 2.5 MG tablet TAKE 30 MIN BEFORE  BUMEX EVERY  MONDAY MORNING,MAY TAKE A DOSE IF WEIGHT IS 3 LBS OR GREATER IF  NEEDED Patient not taking: Reported on 01/23/2018 10/20/17   Leonie Man, MD    Physical Exam:  Constitutional: NAD, calm, comfortable Vitals:   01/24/18 0712 01/24/18 0820 01/24/18 1000 01/24/18 1410  BP:   (!) 133/91 130/88  Pulse:   60 (!) 58  Resp:   20 20  Temp:      TempSrc:      SpO2: 95% (!) 83% 95% 96%  Weight:      Height:       Eyes: PERRL, lids and conjunctivae normal ENMT: Mucous membranes are moist. Posterior pharynx clear of any exudate or lesions.Normal dentition.  Neck: normal, supple, no masses, no thyromegaly Respiratory: Basilar rales bilaterally, no wheezing. Normal respiratory effort. No accessory muscle use.    Cardiovascular: Regular rate and rhythm, no murmurs / rubs / gallops.  2+ extremity edema. 2+ pedal pulses. No carotid bruits.  Abdomen: no tenderness, no masses palpated. No hepatosplenomegaly. Bowel sounds positive.  Musculoskeletal: no clubbing / cyanosis. No joint deformity upper and lower extremities. Good ROM, no contractures. Normal muscle tone.  Skin: no rashes, lesions, ulcers. No induration Neurologic: CN 2-12 grossly intact. Sensation intact, DTR normal. Strength 5/5 in all 4.  Psychiatric: Normal judgment and insight. Alert and oriented x 3.  Normal mood.    Labs on Admission: I have personally reviewed following labs and imaging studies  CBC: Recent Labs  Lab 01/23/18 0458  WBC 4.6  NEUTROABS 2.8  HGB 11.5*  HCT 38.0*  MCV 101.1*  PLT 938   Basic Metabolic Panel: Recent Labs  Lab 01/23/18 0458 01/24/18 0548  NA 141 141  K 3.9 3.9  CL 103 102  CO2 25 29  GLUCOSE 110* 106*  BUN 43* 41*  CREATININE 2.03* 2.00*  CALCIUM 10.0 9.8   GFR: Estimated Creatinine Clearance: 27.4 mL/min (A) (by C-G formula based on SCr of 2 mg/dL (H)). Coagulation Profile: Recent Labs  Lab 01/23/18 0458 01/24/18 0548  INR 2.71 2.91   Cardiac Enzymes: Recent Labs  Lab 01/23/18 0556 01/23/18 1205 01/23/18 1751  TROPONINI 0.17* 0.15* 0.15*   Urine analysis:    Component Value Date/Time   COLORURINE YELLOW 02/21/2017 0103   APPEARANCEUR CLEAR 02/21/2017 0103   LABSPEC 1.013 02/21/2017 0103   PHURINE 5.0 02/21/2017 0103   GLUCOSEU NEGATIVE 02/21/2017 0103   HGBUR SMALL (A) 02/21/2017 0103   BILIRUBINUR NEGATIVE 02/21/2017 0103   KETONESUR NEGATIVE 02/21/2017 0103   PROTEINUR NEGATIVE 02/21/2017 0103   NITRITE NEGATIVE 02/21/2017 0103   LEUKOCYTESUR NEGATIVE 02/21/2017 0103    Radiological Exams on Admission: Dg Chest 2 View  Result Date: 01/23/2018 CLINICAL DATA:  84 y/o M; weakness, decreased appetite, fever, shortness of breath. EXAM: CHEST - 2 VIEW COMPARISON:   01/22/2018 CT chest. FINDINGS: Stable cardiomegaly given projection and technique. 4 AICD leads. Aortic atherosclerosis with calcification. Diffuse reticular and peripheral linear opacities of the lungs. Left upper lobe ground-glass nodule described on CT chest is poorly visualized radiographically. Small bilateral effusions. No pneumothorax. No acute osseous abnormality is evident. Post median sternotomy with wires in alignment. IMPRESSION: Diffuse reticular and peripheral linear opacities of the lungs probably representing interstitial pulmonary edema. Small bilateral effusions. Stable cardiomegaly. No focal consolidation. Electronically Signed   By: Kristine Garbe M.D.   On: 01/23/2018 06:04    EKG: Independently reviewed.  Shows a paced rhythm  Echocardiogram: ------------------------------------------------------------------- LV EF: 35% -   40%  ------------------------------------------------------------------- History:   PMH:  Elevated Troponin.  ------------------------------------------------------------------- Study Conclusions  - Left ventricle: The cavity size was normal. Wall thickness was   increased in a pattern of moderate LVH. Systolic function was   moderately reduced. The estimated ejection fraction was in the   range of 35% to 40%. Diffuse hypokinesis. There is akinesis of   the basalinferior myocardium. There is hypokinesis of the   inferolateral myocardium. Indeterminate diastolic function. - Aortic valve: Trileaflet; moderately calcified leaflets. There   was probable moderate to severe low gradient stenosis. There was   trivial regurgitation. Mean gradient (S): 14 mm Hg. Peak gradient   (S): 22 mm Hg. Valve area (VTI): 1.1 cm^2. Valve area (Vmax):   1.21 cm^2. - Aortic root: The aortic root was mildly ectatic. - Ascending aorta: The ascending aorta was mildly dilated. - Mitral valve: Mildly calcified annulus. There was mild   regurgitation. - Left  atrium: The atrium was moderately dilated. - Right ventricle: Pacer wire or catheter noted in right ventricle.   Systolic function was mildly reduced. - Right atrium: The atrium was mildly dilated. - Atrial septum: No defect or patent foramen ovale was identified. - Tricuspid valve: There was mild regurgitation. - Pulmonary arteries: PA peak pressure: 49 mm Hg (S). - Pericardium, extracardiac: There was no pericardial effusion.   Assessment/Plan  Principal Problem:   Acute on chronic systolic heart failure (HCC) Active Problems:   Acute respiratory failure with hypoxemia (HCC)   Persistent atrial fibrillation (HCC); CHA2DS2Vasc = ~4 (CHF, MI/aortic plaque, Age 41)   CKD (chronic kidney disease), stage III (HCC)   Elevated troponin   Aortic stenosis, mild   Hyperlipidemia with target LDL less than 70   Essential hypertension   Coronary artery disease involving native heart with angina pectoris (HCC)   COPD (chronic obstructive pulmonary disease) (HCC)   Acute on chronic systolic heart failure (HCC) Admit from observation status Continue supplemental oxygen. Continue furosemide 40 mg increased to twice daily dosing, add metolazone Monitor daily weights, intake and output. Monitor renal function and electrolytes. Resume metoprolol on Saturday morning once the patient is feeling better. Echocardiogram shows worsening of systolic function. Consult cardiology  Active Problems:   Persistent atrial fibrillation (HCC) CHA2DS2Vasc all values forward. Continue warfarin. Continue metoprolol for rate control.    CKD (chronic kidney disease), stage III (HCC) Monitor renal function and electrolytes.    Elevated troponin Has been previously elevated before. Likely due to demand ischemia. Echocardiogram worse.  Cardiology has been consulted.    Aortic stenosis, moderate to severe Previously described as mild now appears to be moderate to severe.  Cardiology has been consulted.     Hyperlipidemia with target LDL less than 70 Continue simvastatin 20 mg p.o. daily.    Essential hypertension Rest syndrome metoprolol 25 mg p.o. daily oral tomorrow.    Coronary artery disease involving native heart with angina pectoris (HCC) Continue metoprolol, simvastatin and warfarin.    COPD (chronic obstructive pulmonary disease) (HCC) Continue supplemental oxygen. Bronchodilators as needed.    DVT prophylaxis: Warfarin.  Code Status: Full code. Family Communication:  No family present, patient retains capacity Disposition Plan:  Admit to inpatient status Consults called:  Cardiology, Dr. Katharina Caper  admission status:  Inpatient  Lady Deutscher MD [facility] Pager [phone]  If 7PM-7AM, please contact night-coverage [email or web address] Password [facility]  01/24/2018, 3:46 PM

## 2018-01-24 NOTE — Progress Notes (Signed)
  Echocardiogram 2D Echocardiogram has been performed.  Jennette Dubin 01/24/2018, 2:23 PM

## 2018-01-24 NOTE — Progress Notes (Signed)
ANTICOAGULATION CONSULT NOTE - Follow Up Consult  Pharmacy Consult for Warfarin Indication: atrial fibrillation  Allergies  Allergen Reactions  . Lipitor  [Atorvastatin]   . Nitroglycerin Other (See Comments)    DROPS BLOOD PRESSURE DRASTICALLY Other reaction(s): Other (See Comments) BP drops    Patient Measurements: Height: 5\' 8"  (172.7 cm) Weight: 200 lb 9.9 oz (91 kg) IBW/kg (Calculated) : 68.4   Vital Signs: Temp: 97.6 F (36.4 C) (12/14 0500) Temp Source: Oral (12/14 0500) BP: 133/91 (12/14 1000) Pulse Rate: 60 (12/14 1000)  Labs: Recent Labs    01/23/18 0458 01/23/18 0556 01/23/18 1205 01/23/18 1751 01/24/18 0548  HGB 11.5*  --   --   --   --   HCT 38.0*  --   --   --   --   PLT 204  --   --   --   --   LABPROT 28.4*  --   --   --  30.0*  INR 2.71  --   --   --  2.91  CREATININE 2.03*  --   --   --  2.00*  TROPONINI  --  0.17* 0.15* 0.15*  --     Estimated Creatinine Clearance: 27.4 mL/min (A) (by C-G formula based on SCr of 2 mg/dL (H)).  Assessment: 84 year old male on warfarin prior to admission for Afib INR on admission therapeutic at 2.71  INR today = 2.91  Dose prior to admission 3 mg Monday and Friday, 6 mg other days  Goal of Therapy:  INR 2-3 Monitor platelets by anticoagulation protocol: Yes   Plan:  Warfarin 3 mg po x 1 dose tonight Daily INR  Thank you Anette Guarneri, PharmD [phone]  01/24/2018,11:44 AM

## 2018-01-24 NOTE — Progress Notes (Signed)
Pt ambulated in Hallway on RA w/ walker. Pt sat around 87-91%. Pt tolerated it fairly. Pt experienced SOB while ambulating

## 2018-01-24 NOTE — Consult Note (Signed)
Cardiology Consultation:   Patient ID: Decorian Schuenemann MRN: [id number]; DOB: Nov 15, 1926  Admit date: 01/23/2018 Date of Consult: 01/24/2018  Primary Care Provider: [name], DO Primary Cardiologist: Glenetta Hew, MD  Primary Electrophysiologist:  None    Patient Profile:   Lamontae Ricardo is a 84 y.o. male with a hx of aortic stenosis COPD ischemic cardiomyopathy coronary artery disease who is being seen today for the evaluation of shortness of breath at the request of Dr. Evangeline Gula.  History of Present Illness:   Mr. Matsuo is an 84 year old male here for progressively worsened weakness, nausea, anorexia, shortness of breath following a recent nasal surgery last week.  Has COPD, coronary artery disease, moderate to severe aortic stenosis in the setting of reduced ejection fraction.  BNP was elevated at 860.  Creatinine in range 2.0-1.8.  Troponin was mildly elevated at 0.2.  In talking with him, he is visibly short of breath while laying in the bed.  He is able to complete a few sentences then he needs to take in a few deep breaths.  He currently is enjoying watching on TV the [employer] again.  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  He states that he had his bypass surgery in 2013 in [place].  [place] children said that it was time to come down.  He had a by V ICD placed in 2017 for EF of 31%.  This had improved to approximately 45% in May 2017.  From Dr. Allison Quarry clinic note: He is a long history of CAD -formally a patient of Dr. Gust Brooms.  MI 2002 -while living in [place]   PCI to LAD, OM1 and OM 2 in 2005; had catheterization in 2008. PCI in 2011.   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 Sallyanne Kuster here.   S/p CABG 4 in May 2013:(LIMA to LAD, SVG to OM2, SVG to PDA and SVG to RI).  Most recent Myoview in March 2017:  Demonstrated prior infarct and lateral wall without inducible ischemia. EF 31% with moderate global HK.  Last cardiac cath was in May 2017:   Left main patent. Diffuse LAD disease with competitive flow and distal LAD from LIMA. Mid Cx 35% with occlusion of OM 1 and 90% dCx. OM 2 occluded (perfuse via SVG). Ostial RCA 70%, dRCA 100%.   Patent LIMA-LAD. Widely patent SVG-OM, SVG-PDA: 40% proximal. 100% proximal SVG-RI  Echo July 2017: Moderate LV dysfunction with global HK. Possibly worse in the anterolateral and inferolateral/apical segments. EF 40-45%.   Chronic Combined Systolic and Diastolic CHF --> baseline weight is usually 191 lb (on home scale)   has not been on ACE inhibitor or ARB secondary to hypotension and chronic kidney disease.   On standing dose Bumex with Sliding Scale  PAF (appears to be more persistent)-> on warfarin and Toprol; clearly this exacerbates his heart failure when he goes in [place].   Has history of apparently pleural effusion status post thoracentesis  He occasionally uses a metolazone at home.  Quite rarely.  Back in September during Dr. Allison Quarry last visit he was also noting some decreased energy level.  Past Medical History:  Diagnosis Date  . Brain aneurysm   . Chronic combined systolic and diastolic heart failure, NYHA class 3 (HCC)    EF has seemed to vary report-to-report. Myoview 2007 showed EF 30%,  echo in 2016 7 EF 60-65% -> Echo in 08/2015 & 08/2016  40-45%  (with basal-mid inferoseptal & apical inferolateral wall), Mod P HTN (PAP ~53 mHg) --most recent in January 2019 says EF 45-50%.  . CKD (chronic kidney disease), stage III ([place]) 08/26/2016  . COPD (chronic obstructive pulmonary disease) ([place])   . Coronary artery disease involving native heart with angina pectoris ([place])    a. MI 2002 - PCI to [place]. b. 2005: PCI to LAD, OM1 & OM2.  c. s/p CABG in 2013: LIMA to LAD, SVG to OM2, SVG to PDA and SVG to RI. ; d. Cath 2017: occluded LAD, OM1 & OM2, SVG-RI  with patent LIMA-LAD, SVG-rPDA & SVG-OM2.   . Essential hypertension   . Hyperlipidemia with target LDL less than 70   . Mild aortic stenosis 08/28/2016   Stenosis with mild regurgitation.  . Moderate pulmonary arterial systolic hypertension ([place]) 08/28/2016   PA pressures F has been estimated at 53-59 mmHg on echo-   . Persistent atrial fibrillation   . Presence of biventricular implantable cardioverter-defibrillator (ICD) 2008  . Thyroid disease     Past Surgical History:  Procedure Laterality Date  . APPENDECTOMY    . CARDIAC CATHETERIZATION  06/2015   Left main patent. Diffuse LAD disease with competitive flow and distal LAD from LIMA. Mid Cx 35% with occlusion of OM 1 and 90% dCx. OM 2 occluded (perfuse via SVG). Ostial RCA 70%, dRCA 100%.  Patent LIMA-LAD. Widely patent SVG-OM, SVG-PDA: 40% proximal. 100% proximal SVG-RI  . CARDIAC SURGERY    . CARPAL TUNNEL RELEASE Right 2017  . COLONOSCOPY  09/05/2012   Colon Polyps  . CORONARY ANGIOPLASTY WITH STENT PLACEMENT     Prior PCI to OM 2; 2005 PCI to LAD and OM1 with a Taxus DES 2.5 x 16 mm (postdilated to 3.8 mm) and PTCA of OM 2 stent  . CORONARY ARTERY BYPASS GRAFT  2013   LIMA-LAD, SVG-L1-2, SVG-PDA, SVG-RI (known occlusion of SVG RI)  . KNEE SURGERY Bilateral 1999  . NM MYOVIEW LTD  04/2015   Prior lateral infarct with no scanning. EF 31% with moderate global HK  . PROSTATE SURGERY    . SHOULDER SURGERY Bilateral    Twice  . TRANSTHORACIC ECHOCARDIOGRAM  08/2015   Moderate LV dysfunction with global HK (estimated at 40 and 45%). Worse anterolateral and inferolateral wall.  . TRANSTHORACIC ECHOCARDIOGRAM  08/2016   EF 40-45%. Moderate concentric LVH. Akinesis of basal-mid inferoseptal and apical inferolateral wall.  Aortic sclerosis, no stenosis. Moderate mitral thickening with mild MR. Moderate RV dilation. Moderate TR and PR. PA pressures 53 mmHg  . TRANSTHORACIC ECHOCARDIOGRAM  02/2017    EF 45-50% (similar to last).   Apical-inferolateral akinesis.  Severe focal basal and moderate concentric LVH.  Mild aortic stenosis.  Elevated PA pressures with peak pressures estimated 59 mmHg (moderate PA hypertension)  . VEIN BYPASS SURGERY  2013     Home Medications:  Prior to Admission medications   Medication Sig Start Date End Date Taking? Authorizing Provider  aspirin 81 MG chewable tablet Chew 81 mg by mouth daily.   Yes [provider]  bumetanide (BUMEX) 2 MG tablet Take 2 mg by mouth daily.   Yes [provider]  bumetanide (BUMEX) 2 MG tablet Take 2 mg by mouth daily as needed (if weight gain is greater than 8 pounds).   Yes [provider]  cyanocobalamin 1000 MCG tablet Take 1 tablet (1,000 mcg total)  by mouth daily. 02/24/17  Yes Hongalgi, Lenis Dickinson, MD  levETIRAcetam (KEPPRA) 500 MG tablet TAKE 1 TABLET BY MOUTH TWICE DAILY Patient taking differently: Take 500 mg by mouth 2 (two) times daily.  11/27/17  Yes Wendling, Crosby Oyster, DO  levothyroxine (SYNTHROID, LEVOTHROID) 50 MCG tablet TAKE 1 TABLET BY MOUTH ONCE DAILY Patient taking differently: Take 50 mcg by mouth daily before breakfast.  08/25/17  Yes Wendling, Crosby Oyster, DO  metoprolol tartrate (LOPRESSOR) 25 MG tablet Take 25 mg by mouth daily.   Yes [provider]  omeprazole (PRILOSEC) 20 MG capsule TAKE 1 CAPSULE BY MOUTH ONCE DAILY Patient taking differently: Take 20 mg by mouth daily.  08/04/17  Yes [name], DO  potassium chloride (K-DUR) 10 MEQ tablet Take 10 mEq by mouth daily.   Yes [provider]  simvastatin (ZOCOR) 20 MG tablet TAKE 1 TABLET BY MOUTH ONCE DAILY IN THE EVENING Patient taking differently: Take 20 mg by mouth daily.  11/04/17  Yes [name], DO  warfarin (COUMADIN) 6 MG tablet Take everyday except Mondays and Fridays. On Mondays and Fridays, take 7.5mg  (separate rx). Updated 10/28/17. Patient taking differently: Take 3-6 mg by mouth See admin  instructions. Take 1/2 tablet on Monday and Friday then take 1 tablet all the other days 12/11/17  Yes Wendling, Crosby Oyster, DO  bumetanide (BUMEX) 1 MG tablet TAKE 2 TABLETS BY MOUTH ONCE DAILY AND 1 AS NEEDED DEPENDING  ON  DAILY  WEIGHT Patient not taking: Reported on 01/23/2018 11/27/17   Leonie Man, MD  metolazone (ZAROXOLYN) 2.5 MG tablet TAKE 30 MIN BEFORE  BUMEX EVERY  MONDAY MORNING,MAY TAKE A DOSE IF WEIGHT IS 3 LBS OR GREATER IF  NEEDED Patient not taking: Reported on 01/23/2018 10/20/17   Leonie Man, MD    Inpatient Medications: Scheduled Meds: . aspirin  81 mg Oral Daily  . furosemide  40 mg Intravenous Once  . furosemide  80 mg Intravenous BID  . levETIRAcetam  500 mg Oral BID  . levothyroxine  50 mcg Oral Q0600  . metolazone  5 mg Oral Daily  . pantoprazole  40 mg Oral Daily  . potassium chloride  10 mEq Oral Daily  . simvastatin  20 mg Oral q1800  . warfarin  3 mg Oral ONCE-1800  . Warfarin - Pharmacist Dosing Inpatient   Does not apply q1800   Continuous Infusions:  PRN Meds: acetaminophen **OR** acetaminophen, ondansetron **OR** ondansetron (ZOFRAN) IV  Allergies:    Allergies  Allergen Reactions  . Lipitor  [Atorvastatin]   . Nitroglycerin Other (See Comments)    DROPS BLOOD PRESSURE DRASTICALLY Other reaction(s): Other (See Comments) BP drops    Social History:   Social History   Socioeconomic History  . Marital status: Married    Spouse name: Not on file  . Number of children: Not on file  . Years of education: Not on file  . Highest education level: Not on file  Occupational History  . Not on file  Social Needs  . Financial resource strain: Not on file  . Food insecurity:    Worry: Not on file    Inability: Not on file  . Transportation needs:    Medical: Not on file    Non-medical: Not on file  Tobacco Use  . Smoking status: Former Smoker    Types: Cigarettes    Last attempt to quit: 05/30/1967    Years since quitting: 50.6   . Smokeless  tobacco: Never Used  Substance and Sexual Activity  . Alcohol use: Yes    Alcohol/week: 6.0 standard drinks    Types: 6 Glasses of wine per week    Comment: weekly  . Drug use: No  . Sexual activity: Not on file  Lifestyle  . Physical activity:    Days per week: Not on file    Minutes per session: Not on file  . Stress: Not on file  Relationships  . Social connections:    Talks on phone: Not on file    Gets together: Not on file    Attends religious service: Not on file    Active member of club or organization: Not on file    Attends meetings of clubs or organizations: Not on file    Relationship status: Not on file  . Intimate partner violence:    Fear of current or ex partner: Not on file    Emotionally abused: Not on file    Physically abused: Not on file    Forced sexual activity: Not on file  Other Topics Concern  . Not on file  Social History Narrative   He has been married for 97 years. They have 5 children and 10 grandchildren. 12 great-grandchildren.   He recently moved to [facility], [place] and oriented close to his daughter here. His wife has been moved into an Alzheimer's/memory unit nearby.   He currently lives with his daughter.   He has a college degree and previously worked as a self-employed Materials engineer.   He himself quit driving couple years ago because he was concerned about his reaction time. He is therefore depend upon his family or public transportation.   He quit smoking in 1969 having smoked for 20 years.   He drinks maybe 4-7 drinks alcohol/wine a week.   He does not exercise any longer because of shortness of breath and lack of energy.    Family History:    Family History  Problem Relation Age of Onset  . Arthritis Mother   . Heart disease Father      ROS:  Please see the history of present illness.  Denies any fevers chills nausea vomiting syncope bleeding All other ROS reviewed and negative.     Physical Exam/Data:    Vitals:   01/24/18 0712 01/24/18 0820 01/24/18 1000 01/24/18 1410  BP:   (!) 133/91 130/88  Pulse:   60 (!) 58  Resp:   20 20  Temp:      TempSrc:      SpO2: 95% (!) 83% 95% 96%  Weight:      Height:        Intake/Output Summary (Last 24 hours) at 01/24/2018 1622 Last data filed at 01/24/2018 1000 Gross per 24 hour  Intake -  Output 650 ml  Net -650 ml   Filed Weights   01/23/18 0451  Weight: 91 kg   Body mass index is 30.5 kg/m.  General:  Well nourished, well developed, in no acute distress, elderly, mildly increased respiratory effort when talking HEENT: Nasal scar noted Lymph: no adenopathy Neck: Mid neck JVD Endocrine:  No thryomegaly Vascular: No carotid bruits; FA pulses 2+ bilaterally without bruits  Cardiac:  normal S1, S2; RRR; 3/6 crescendo decrescendo murmur systolic right upper sternal border Lungs: Basilar rales noted  abd: soft, nontender, no hepatomegaly  Ext: 1+ bilateral lower extremity edema Musculoskeletal:  No deformities, BUE and BLE strength normal and equal Skin: warm and dry  Neuro:  CNs 2-12 intact, no focal abnormalities noted Psych:  Normal affect   EKG:  The EKG was personally reviewed and demonstrates: Ventricular paced rhythm, underlying atrial fibrillation  Telemetry:  Telemetry was personally reviewed and demonstrates: Ventricular paced rhythm  Relevant CV Studies:  Echocardiogram 01/24/2018: - Left ventricle: The cavity size was normal. Wall thickness was   increased in a pattern of moderate LVH. Systolic function was   moderately reduced. The estimated ejection fraction was in the   range of 35% to 40%. Diffuse hypokinesis. There is akinesis of   the basalinferior myocardium. There is hypokinesis of the   inferolateral myocardium. Indeterminate diastolic function. - Aortic valve: Trileaflet; moderately calcified leaflets. There   was probable moderate to severe low gradient stenosis. There was   trivial regurgitation. Mean  gradient (S): 14 mm Hg. Peak gradient   (S): 22 mm Hg. Valve area (VTI): 1.1 cm^2. Valve area (Vmax):   1.21 cm^2. - Aortic root: The aortic root was mildly ectatic. - Ascending aorta: The ascending aorta was mildly dilated. - Mitral valve: Mildly calcified annulus. There was mild   regurgitation. - Left atrium: The atrium was moderately dilated. - Right ventricle: Pacer wire or catheter noted in right ventricle.   Systolic function was mildly reduced. - Right atrium: The atrium was mildly dilated. - Atrial septum: No defect or patent foramen ovale was identified. - Tricuspid valve: There was mild regurgitation. - Pulmonary arteries: PA peak pressure: 49 mm Hg (S). - Pericardium, extracardiac: There was no pericardial effusion.   Laboratory Data:  Chemistry Recent Labs  Lab 01/23/18 0458 01/24/18 0548  NA 141 141  K 3.9 3.9  CL 103 102  CO2 25 29  GLUCOSE 110* 106*  BUN 43* 41*  CREATININE 2.03* 2.00*  CALCIUM 10.0 9.8  GFRNONAA 28* 29*  GFRAA 33* 33*  ANIONGAP 13 10    No results for input(s): PROT, ALBUMIN, AST, ALT, ALKPHOS, BILITOT in the last 168 hours. Hematology Recent Labs  Lab 01/23/18 0458  WBC 4.6  RBC 3.76*  HGB 11.5*  HCT 38.0*  MCV 101.1*  MCH 30.6  MCHC 30.3  RDW 14.9  PLT 204   Cardiac Enzymes Recent Labs  Lab 01/23/18 0556 01/23/18 1205 01/23/18 1751  TROPONINI 0.17* 0.15* 0.15*    Recent Labs  Lab 01/23/18 0521  TROPIPOC 0.20*    BNP Recent Labs  Lab 01/23/18 0458  BNP 869.8*    DDimer No results for input(s): DDIMER in the last 168 hours.  Radiology/Studies:  Dg Chest 2 View  Result Date: 01/23/2018 CLINICAL DATA:  84 y/o M; weakness, decreased appetite, fever, shortness of breath. EXAM: CHEST - 2 VIEW COMPARISON:  01/22/2018 CT chest. FINDINGS: Stable cardiomegaly given projection and technique. 4 AICD leads. Aortic atherosclerosis with calcification. Diffuse reticular and peripheral linear opacities of the lungs. Left  upper lobe ground-glass nodule described on CT chest is poorly visualized radiographically. Small bilateral effusions. No pneumothorax. No acute osseous abnormality is evident. Post median sternotomy with wires in alignment. IMPRESSION: Diffuse reticular and peripheral linear opacities of the lungs probably representing interstitial pulmonary edema. Small bilateral effusions. Stable cardiomegaly. No focal consolidation. Electronically Signed   By: Kristine Garbe M.D.   On: 01/23/2018 06:04   Ct Chest W Contrast  Result Date: 01/22/2018 CLINICAL DATA:  Followup of lung nodule, history of chronic kidney disease EXAM: CT CHEST WITH CONTRAST TECHNIQUE: Multidetector CT imaging of the chest was performed during intravenous contrast administration. CONTRAST:  36mL ISOVUE-300  IOPAMIDOL (ISOVUE-300) INJECTION 61% COMPARISON:  CT chest of 02/23/2017 FINDINGS: Cardiovascular: Sgnificant thoracic aortic atherosclerotic change is present. Again an aberrant right subclavian artery is noted coursing posterior to the esophagus. The thoracic aorta is not well opacified but no acute abnormality is seen. There is better opacification of the pulmonary arteries. No evidence of pulmonary embolism is seen. Cardiomegaly is present and is stable. Changes of prior CABG are present with median sternotomy sutures noted. Mediastinum/Nodes: No mediastinal or hilar adenopathy is seen. Only small mediastinal lymph nodes are present. There is a rounded low-attenuation nodule in the right thyroid gland of approximately 1.9 cm and ultrasound of the thyroid may be helpful to evaluate further if warranted clinically in this age patient. Lungs/Pleura: The ground-glass opacity within the posterior aspect of the right upper lobe appears slightly larger measuring 22 x 20 x 19 mm compared to 20 x 20 x 17 mm. Also there are soft tissue components within this ground-glass opacity. Follow-up non-contrast CT recommended at 3-6 months to confirm  persistence. If unchanged, and solid component remains <6 mm, annual CT is recommended until 5 years of stability has been established. If persistent these nodules should be considered highly suspicious if the solid component of the nodule is 6 mm or greater in size and enlarging. This recommendation follows the consensus statement: Guidelines for Management of Incidental Pulmonary Nodules Detected on CT Images: From the Fleischner Society 2017; Radiology 2017; 284:228-243. No additional lung nodule is seen. There are small pleural effusions bilaterally with bibasilar dependent atelectasis as well. Somewhat prominent interstitial markings are noted primarily at the lung bases which may indicate a degree of interstitial edema. There is mild cardiomegaly present. The central airway is patent. Upper Abdomen: There is a simple appearing cyst emanating from the upper pole of the right kidney anteromedially. Also an apparent cyst is noted within the left lobe of liver lateral segment. No other abnormality within the upper abdomen is evident on the limited views obtained. Musculoskeletal: There is significant thoracic kyphoscoliosis present with diffuse degenerative change. No acute compression deformity is seen. IMPRESSION: 1. Slight increase in size of the partly solid ground-glass opacity within the posterior right upper lobe now measuring 2.2 x 2.0 x 1.9 cm compared to 2.0 x 2.0 x 1.7 cm previously. Follow-up non-contrast CT recommended at 3-6 months to confirm persistence. If unchanged, and solid component remains <6 mm, annual CT is recommended until 5 years of stability has been established. If persistent this nodule should be considered highly suspicious if the solid component of the nodule is 6 mm or greater in size and enlarging. This recommendation follows the consensus statement: Guidelines for Management of Incidental Pulmonary Nodules Detected on CT Images: From the Fleischner Society 2017; Radiology 2017;  284:228-243. 2. Significant thoracic aortic atherosclerosis. Variation of aberrant right subclavian artery. 3. Small effusions and somewhat prominent interstitial markings at the lung bases may indicate a degree of interstitial edema. Electronically Signed   By: Ivar Drape M.D.   On: 01/22/2018 13:21    Assessment and Plan:   Acute on chronic systolic heart failure - Agree with utilization of Lasix 40 mg IV twice daily for optimization, 2 L out if possible daily.  If we do not meet this goal please go up to 80 mg IV twice a day.  Consider adding metolazone as well as increasing the Lasix dose. - At baseline in September his symptoms were class II-III.  He was complaining of exertional dyspnea then.   COPD -  Also playing a role in his shortness of breath.  Moderate to severe aortic stenosis in the setting of EF 35% - Low gradient aortic stenosis likely.  Prognosis can be tenuous in this setting.  He certainly would not be an open surgical candidate.  Potentially could contemplate TAVR team evaluation.  His ejection fraction has remained low over the past several years, this is not a new phenomenon based upon his aortic stenosis.  Would his symptoms improve if aortic valve replaced?  Elevated troponin - Chronically elevated.  Likely demand ischemia in the setting of his underlying cardiomyopathy.  Coronary artery disease post CABG - Metoprolol simvastatin warfarin.  Persistent atrial fibrillation - On warfarin.  Prefers to stay on this medication.  Rate seems reasonably controlled.  He has had trouble with heart failure in the past with rapid ventricular response.      For questions or updates, please contact [place] Please consult [email or web address] for contact info under     Signed, Candee Furbish, MD  01/24/2018 4:22 PM

## 2018-01-25 DIAGNOSIS — N183 Chronic kidney disease, stage 3 (moderate): Secondary | ICD-10-CM | POA: Diagnosis present

## 2018-01-25 DIAGNOSIS — Z95810 Presence of automatic (implantable) cardiac defibrillator: Secondary | ICD-10-CM | POA: Diagnosis not present

## 2018-01-25 DIAGNOSIS — I255 Ischemic cardiomyopathy: Secondary | ICD-10-CM | POA: Diagnosis present

## 2018-01-25 DIAGNOSIS — Z79899 Other long term (current) drug therapy: Secondary | ICD-10-CM | POA: Diagnosis not present

## 2018-01-25 DIAGNOSIS — Z888 Allergy status to other drugs, medicaments and biological substances status: Secondary | ICD-10-CM | POA: Diagnosis not present

## 2018-01-25 DIAGNOSIS — Z955 Presence of coronary angioplasty implant and graft: Secondary | ICD-10-CM | POA: Diagnosis not present

## 2018-01-25 DIAGNOSIS — I080 Rheumatic disorders of both mitral and aortic valves: Secondary | ICD-10-CM | POA: Diagnosis present

## 2018-01-25 DIAGNOSIS — J9601 Acute respiratory failure with hypoxia: Secondary | ICD-10-CM | POA: Diagnosis present

## 2018-01-25 DIAGNOSIS — I2510 Atherosclerotic heart disease of native coronary artery without angina pectoris: Secondary | ICD-10-CM | POA: Diagnosis present

## 2018-01-25 DIAGNOSIS — Z7982 Long term (current) use of aspirin: Secondary | ICD-10-CM | POA: Diagnosis not present

## 2018-01-25 DIAGNOSIS — I130 Hypertensive heart and chronic kidney disease with heart failure and stage 1 through stage 4 chronic kidney disease, or unspecified chronic kidney disease: Secondary | ICD-10-CM | POA: Diagnosis present

## 2018-01-25 DIAGNOSIS — J449 Chronic obstructive pulmonary disease, unspecified: Secondary | ICD-10-CM | POA: Diagnosis present

## 2018-01-25 DIAGNOSIS — I248 Other forms of acute ischemic heart disease: Secondary | ICD-10-CM | POA: Diagnosis present

## 2018-01-25 DIAGNOSIS — I252 Old myocardial infarction: Secondary | ICD-10-CM | POA: Diagnosis not present

## 2018-01-25 DIAGNOSIS — Z8673 Personal history of transient ischemic attack (TIA), and cerebral infarction without residual deficits: Secondary | ICD-10-CM | POA: Diagnosis not present

## 2018-01-25 DIAGNOSIS — I5043 Acute on chronic combined systolic (congestive) and diastolic (congestive) heart failure: Secondary | ICD-10-CM | POA: Diagnosis present

## 2018-01-25 DIAGNOSIS — Z66 Do not resuscitate: Secondary | ICD-10-CM | POA: Diagnosis present

## 2018-01-25 DIAGNOSIS — I4819 Other persistent atrial fibrillation: Secondary | ICD-10-CM | POA: Diagnosis present

## 2018-01-25 DIAGNOSIS — Z87891 Personal history of nicotine dependence: Secondary | ICD-10-CM | POA: Diagnosis not present

## 2018-01-25 DIAGNOSIS — N179 Acute kidney failure, unspecified: Secondary | ICD-10-CM | POA: Diagnosis present

## 2018-01-25 DIAGNOSIS — Z79890 Hormone replacement therapy (postmenopausal): Secondary | ICD-10-CM | POA: Diagnosis not present

## 2018-01-25 DIAGNOSIS — R0902 Hypoxemia: Secondary | ICD-10-CM | POA: Diagnosis not present

## 2018-01-25 DIAGNOSIS — Z7901 Long term (current) use of anticoagulants: Secondary | ICD-10-CM | POA: Diagnosis not present

## 2018-01-25 DIAGNOSIS — I5023 Acute on chronic systolic (congestive) heart failure: Secondary | ICD-10-CM | POA: Diagnosis not present

## 2018-01-25 DIAGNOSIS — I509 Heart failure, unspecified: Secondary | ICD-10-CM

## 2018-01-25 DIAGNOSIS — I272 Pulmonary hypertension, unspecified: Secondary | ICD-10-CM | POA: Diagnosis present

## 2018-01-25 DIAGNOSIS — E785 Hyperlipidemia, unspecified: Secondary | ICD-10-CM | POA: Diagnosis present

## 2018-01-25 LAB — BASIC METABOLIC PANEL WITH GFR
Anion gap: 13 (ref 5–15)
BUN: 40 mg/dL — ABNORMAL HIGH (ref 8–23)
CO2: 30 mmol/L (ref 22–32)
Calcium: 10.2 mg/dL (ref 8.9–10.3)
Chloride: 96 mmol/L — ABNORMAL LOW (ref 98–111)
Creatinine, Ser: 2.18 mg/dL — ABNORMAL HIGH (ref 0.61–1.24)
GFR calc Af Amer: 30 mL/min — ABNORMAL LOW
GFR calc non Af Amer: 26 mL/min — ABNORMAL LOW
Glucose, Bld: 121 mg/dL — ABNORMAL HIGH (ref 70–99)
Potassium: 3.9 mmol/L (ref 3.5–5.1)
Sodium: 139 mmol/L (ref 135–145)

## 2018-01-25 LAB — MRSA PCR SCREENING: MRSA by PCR: NEGATIVE

## 2018-01-25 LAB — PROTIME-INR
INR: 3
Prothrombin Time: 30.7 s — ABNORMAL HIGH (ref 11.4–15.2)

## 2018-01-25 MED ORDER — HYDROCORTISONE 1 % EX CREA
TOPICAL_CREAM | CUTANEOUS | Status: DC | PRN
  Administered 2018-01-26: 14:00:00 via TOPICAL
  Filled 2018-01-25 (×2): qty 28

## 2018-01-25 MED ORDER — WARFARIN SODIUM 3 MG PO TABS
3.0000 mg | ORAL_TABLET | Freq: Once | ORAL | Status: AC
  Administered 2018-01-25: 3 mg via ORAL
  Filled 2018-01-25: qty 1

## 2018-01-25 MED ORDER — HYDROCORTISONE 1 % EX CREA
TOPICAL_CREAM | Freq: Two times a day (BID) | CUTANEOUS | Status: DC
  Administered 2018-01-25 – 2018-01-26 (×4): via TOPICAL
  Administered 2018-01-27: 1 via TOPICAL
  Filled 2018-01-25: qty 28

## 2018-01-25 MED ORDER — HYDROCORTISONE 0.5 % EX CREA
TOPICAL_CREAM | CUTANEOUS | Status: DC | PRN
  Filled 2018-01-25: qty 28.35

## 2018-01-25 MED ORDER — HYDROCORTISONE 0.5 % EX CREA
TOPICAL_CREAM | Freq: Two times a day (BID) | CUTANEOUS | Status: DC
  Filled 2018-01-25: qty 28.35

## 2018-01-25 NOTE — H&P (Signed)
PROGRESS NOTE    Kenneth Bell  LPF:[id number] DOB: [birth year] DOA: 01/23/2018 PCP: [name], DO   Brief Narrative:   Kenneth Bell is an 84 year old male with a medical history significant for brain aneurysm, chronic combined systolic and diastolic heart failure, hypertension, chronic kidney disease stage III, COPD, CAD, hyperlipidemia, mild aortic stenosis and regurgitation, moderate pulmonary hypertension, chronic atrial fibrillation, history of biventricular ICD placement, thyroid disease who is coming to the emergency department due to progressively worse weakness, decreased appetite and shortness of breath following nosesurgery last week.He denies chest pain, palpitations, diaphoresis, pedal edema, but complains of PND, orthopnea.   Admitted for treatment of acute on chronic systolic congestive heart failure with acute respiratory failure and hypoxemia.  Despite diuresis the patient continues to require oxygen and in fact had desaturations with ambulation requiring oxygen.  He is also requiring IV Lasix for treatment of his pulmonary edema.  He continues to have edema on examination.  His echocardiogram shows a worsening ejection fraction and progression of his mild aortic stenosis to moderate to severe.    Cardiology consult is appreciated.   Assessment & Plan:   Principal Problem:   Acute on chronic systolic heart failure (HCC) Active Problems:   Acute respiratory failure with hypoxemia (HCC)   Persistent atrial fibrillation (HCC); CHA2DS2Vasc = ~4 (CHF, MI/aortic plaque, Age 84)   CKD (chronic kidney disease), stage III (HCC)   Elevated troponin   Aortic stenosis, mild   Hyperlipidemia with target LDL less than 70   Essential hypertension   Coronary artery disease involving native heart with angina pectoris (HCC)   COPD (chronic obstructive pulmonary disease) (HCC)   CHF (congestive heart failure) (HCC)   Acute on chronic systolic heart failure (HCC) Admit  from observation status Continue supplemental oxygen. Continue furosemide 40 mg increased to twice daily dosing, add metolazone Monitor daily weights, intake and output. Monitor renal function and electrolytes. Resume metoprolol on Saturday morning once the patient is feeling better. Echocardiogram shows worsening of systolic function. Consult cardiology  Active Problems: Persistent atrial fibrillation (HCC) CHA2DS2Vascall values forward. Continue warfarin. Continue metoprolol for rate control.  CKD (chronic kidney disease), stage III (HCC) Monitor renal function and electrolytes.  Elevated troponin Has been previously elevated before. Likely due to demand ischemia. Echocardiogram worse.  Cardiology has been consulted.  Aortic stenosis, moderate to severe Previously described as mild now appears to be moderate to severe.  Cardiology has been consulted.  Hyperlipidemia with target LDL less than 70 Continue simvastatin 20 mg p.o. daily.  Essential hypertension Rest syndrome metoprolol 25 mg p.o. daily oral tomorrow.  Coronary artery disease involving native heart with angina pectoris (HCC) Continue metoprolol, simvastatin and warfarin.  COPD (chronic obstructive pulmonary disease) (HCC) Continue supplemental oxygen. Bronchodilators as needed.    DVT prophylaxis:Warfarin. Code Status:Full code. Family Communication: No family present, patient retains capacity Disposition Plan: Admit to inpatient status Consults called: Cardiology, Dr. Marlou Porch admission status: Inpatient   Subjective: Long discussion with patient regarding goals of care.  He does not want to be resuscitated.  Audiology discerning next best plan.  We will continue current diuresis.  Denies chest pain is starting to feel a little bit better.  Objective: Vitals:   01/25/18 0014 01/25/18 0525 01/25/18 0802 01/25/18 1313  BP: 126/86 130/83 112/66 110/69  Pulse: 60 (!)  45 (!) 43 (!) 59  Resp: 18 18 15  (!) 22  Temp: 98.9 F (37.2 C) 98.3 F (36.8 C) 97.7 F (36.5 C) 98.3 F (  36.8 C)  TempSrc: Oral Oral Oral Oral  SpO2: 100% 93% 98% 98%  Weight:  88.5 kg    Height:        Intake/Output Summary (Last 24 hours) at 01/25/2018 1509 Last data filed at 01/25/2018 0954 Gross per 24 hour  Intake 240 ml  Output 2525 ml  Net -2285 ml   Filed Weights   01/23/18 0451 01/25/18 0525  Weight: 91 kg 88.5 kg    Examination:  General exam: Appears calm and comfortable  Respiratory system: Clear to auscultation. Respiratory effort normal. Cardiovascular system: S1 & S2 heard, RRR. No JVD, +2/9 systolic ejection murmurs, no rubs, gallops or clicks. No pedal edema. Gastrointestinal system: Abdomen is nondistended, soft and nontender. No organomegaly or masses felt. Normal bowel sounds heard. Central nervous system: Alert and oriented. No focal neurological deficits. Extremities: Symmetric 5 x 5 power. Skin: No rashes, lesions or ulcers Psychiatry: Judgement and insight appear normal. Mood & affect appropriate.    Data Reviewed: I have personally reviewed following labs and imaging studies  CBC: Recent Labs  Lab 01/23/18 0458  WBC 4.6  NEUTROABS 2.8  HGB 11.5*  HCT 38.0*  MCV 101.1*  PLT 562   Basic Metabolic Panel: Recent Labs  Lab 01/23/18 0458 01/24/18 0548 01/25/18 0413  NA 141 141 139  K 3.9 3.9 3.9  CL 103 102 96*  CO2 25 29 30   GLUCOSE 110* 106* 121*  BUN 43* 41* 40*  CREATININE 2.03* 2.00* 2.18*  CALCIUM 10.0 9.8 10.2   Coagulation Profile: Recent Labs  Lab 01/23/18 0458 01/24/18 0548 01/25/18 0413  INR 2.71 2.91 3.00   Cardiac Enzymes: Recent Labs  Lab 01/23/18 0556 01/23/18 1205 01/23/18 1751  TROPONINI 0.17* 0.15* 0.15*     Recent Results (from the past 240 hour(s))  MRSA PCR Screening     Status: None   Collection Time: 01/25/18  5:17 AM  Result Value Ref Range Status   MRSA by PCR NEGATIVE NEGATIVE Final      Comment:        The GeneXpert MRSA Assay (FDA approved for NASAL specimens only), is one component of a comprehensive MRSA colonization surveillance program. It is not intended to diagnose MRSA infection nor to guide or monitor treatment for MRSA infections. Performed at [facility], [place] [address], [place], [place] [address]      Radiology Studies: No results found.   Scheduled Meds: . aspirin  81 mg Oral Daily  . furosemide  80 mg Intravenous BID  . hydrocortisone cream   Topical BID  . levETIRAcetam  500 mg Oral BID  . levothyroxine  50 mcg Oral Q0600  . metolazone  5 mg Oral Daily  . pantoprazole  40 mg Oral Daily  . potassium chloride  10 mEq Oral Daily  . simvastatin  20 mg Oral q1800  . warfarin  3 mg Oral ONCE-1800  . Warfarin - Pharmacist Dosing Inpatient   Does not apply q1800   Continuous Infusions:   LOS: 0 days    Time spent: 45 minutes   Lady Deutscher, MD FACP [facility] Pager [phone]  If 7PM-7AM, please contact night-coverage [email or web address] Password [email or web address] 01/25/2018, 3:09 PM

## 2018-01-25 NOTE — Progress Notes (Signed)
ANTICOAGULATION CONSULT NOTE - Follow Up Consult  Pharmacy Consult for Warfarin Indication: atrial fibrillation  Allergies  Allergen Reactions  . Lipitor  [Atorvastatin]   . Nitroglycerin Other (See Comments)    DROPS BLOOD PRESSURE DRASTICALLY Other reaction(s): Other (See Comments) BP drops    Patient Measurements: Height: 5\' 8"  (172.7 cm) Weight: 195 lb (88.5 kg)(pt efused to stand) IBW/kg (Calculated) : 68.4   Vital Signs: Temp: 97.7 F (36.5 C) (12/15 0802) Temp Source: Oral (12/15 0802) BP: 112/66 (12/15 0802) Pulse Rate: 43 (12/15 0802)  Labs: Recent Labs    01/23/18 0458 01/23/18 0556 01/23/18 1205 01/23/18 1751 01/24/18 0548 01/25/18 0413  HGB 11.5*  --   --   --   --   --   HCT 38.0*  --   --   --   --   --   PLT 204  --   --   --   --   --   LABPROT 28.4*  --   --   --  30.0* 30.7*  INR 2.71  --   --   --  2.91 3.00  CREATININE 2.03*  --   --   --  2.00* 2.18*  TROPONINI  --  0.17* 0.15* 0.15*  --   --     Estimated Creatinine Clearance: 24.8 mL/min (A) (by C-G formula based on SCr of 2.18 mg/dL (H)).  Assessment: 84 year old male on warfarin prior to admission for Afib. INR therapeutic on admission 12/13 at 2.71. Pharmacy has been consulted to manage his warfarin.   INR therapeutic at 3.0. No signs/symptoms of bleeding noted. Patient denies any issues.   Patient states dose prior to admission 7.5 mg Monday and Friday, 6 mg other days. This is consistent with out patient notes, but differs from med history.   Goal of Therapy:  INR 2-3 Monitor platelets by anticoagulation protocol: Yes   Plan:  Warfarin 3 mg po x 1 dose tonight Daily INR, monitor for signs/symptoms of bleeding and interacting medications   Isaias Sakai, Pharm D PGY1 Pharmacy Resident  Phone [phone] Please use AMION for clinical pharmacists numbers  01/25/2018      9:19 AM

## 2018-01-25 NOTE — Progress Notes (Signed)
Pharmacist Heart Failure Core Measure Documentation  Assessment: Kenneth Bell has an EF documented as 35-40% on 01/24/18 by ECHO.  Rationale: Heart failure patients with left ventricular systolic dysfunction (LVSD) and an EF < 40% should be prescribed an angiotensin converting enzyme inhibitor (ACEI) or angiotensin receptor blocker (ARB) at discharge unless a contraindication is documented in the medical record.  This patient is not currently on an ACEI or ARB for HF.  This note is being placed in the record in order to provide documentation that a contraindication to the use of these agents is present for this encounter.  ACE Inhibitor or Angiotensin Receptor Blocker is contraindicated (specify all that apply)  []   ACEI allergy AND ARB allergy []   Angioedema [x]   Moderate or severe aortic stenosis []   Hyperkalemia []   Hypotension []   Renal artery stenosis [x]   Worsening renal function, preexisting renal disease or dysfunction   Jackson Latino, PharmD PGY1 Pharmacy Resident Phone [phone] 01/25/2018     12:52 PM

## 2018-01-25 NOTE — Plan of Care (Signed)
  Problem: Coping: Goal: Level of anxiety will decrease Outcome: Progressing   Problem: Pain Managment: Goal: General experience of comfort will improve Outcome: Progressing   Problem: Safety: Goal: Ability to remain free from injury will improve Outcome: Progressing   Problem: Skin Integrity: Goal: Risk for impaired skin integrity will decrease Outcome: Progressing   

## 2018-01-26 LAB — BASIC METABOLIC PANEL
Anion gap: 17 — ABNORMAL HIGH (ref 5–15)
BUN: 44 mg/dL — ABNORMAL HIGH (ref 8–23)
CO2: 27 mmol/L (ref 22–32)
CREATININE: 2.41 mg/dL — AB (ref 0.61–1.24)
Calcium: 10.4 mg/dL — ABNORMAL HIGH (ref 8.9–10.3)
Chloride: 92 mmol/L — ABNORMAL LOW (ref 98–111)
GFR calc Af Amer: 27 mL/min — ABNORMAL LOW (ref >60)
GFR calc non Af Amer: 23 mL/min — ABNORMAL LOW (ref >60)
Glucose, Bld: 117 mg/dL — ABNORMAL HIGH (ref 70–99)
Potassium: 3.6 mmol/L (ref 3.5–5.1)
Sodium: 136 mmol/L (ref 135–145)

## 2018-01-26 LAB — PROTIME-INR
INR: 2.78
Prothrombin Time: 28.9 seconds — ABNORMAL HIGH (ref 11.4–15.2)

## 2018-01-26 MED ORDER — WARFARIN SODIUM 3 MG PO TABS
3.0000 mg | ORAL_TABLET | Freq: Once | ORAL | Status: AC
  Administered 2018-01-26: 3 mg via ORAL
  Filled 2018-01-26: qty 1

## 2018-01-26 MED ORDER — METOLAZONE 2.5 MG PO TABS
2.5000 mg | ORAL_TABLET | ORAL | Status: DC
  Administered 2018-01-26: 2.5 mg via ORAL
  Filled 2018-01-26 (×2): qty 1

## 2018-01-26 MED ORDER — BUMETANIDE 2 MG PO TABS
2.0000 mg | ORAL_TABLET | Freq: Every day | ORAL | Status: DC
  Administered 2018-01-26 – 2018-01-27 (×2): 2 mg via ORAL
  Filled 2018-01-26 (×2): qty 1

## 2018-01-26 MED ORDER — METOPROLOL SUCCINATE ER 25 MG PO TB24
25.0000 mg | ORAL_TABLET | Freq: Every day | ORAL | Status: DC
  Administered 2018-01-26 – 2018-01-27 (×2): 25 mg via ORAL
  Filled 2018-01-26 (×2): qty 1

## 2018-01-26 NOTE — Progress Notes (Signed)
ANTICOAGULATION CONSULT NOTE - Follow Up Consult  Pharmacy Consult for Coumadin Indication: atrial fibrillation  Allergies  Allergen Reactions  . Lipitor  [Atorvastatin]   . Nitroglycerin Other (See Comments)    DROPS BLOOD PRESSURE DRASTICALLY Other reaction(s): Other (See Comments) BP drops    Patient Measurements: Height: 5\' 8"  (172.7 cm) Weight: 184 lb 1.6 oz (83.5 kg) IBW/kg (Calculated) : 68.4   Vital Signs: Temp: 98 F (36.7 C) (12/16 1150) Temp Source: Oral (12/16 1150) BP: 105/70 (12/16 1150) Pulse Rate: 57 (12/16 1150)  Labs: Recent Labs    01/23/18 1751 01/24/18 0548 01/25/18 0413 01/26/18 0436  LABPROT  --  30.0* 30.7* 28.9*  INR  --  2.91 3.00 2.78  CREATININE  --  2.00* 2.18* 2.41*  TROPONINI 0.15*  --   --   --     Estimated Creatinine Clearance: 21.9 mL/min (A) (by C-G formula based on SCr of 2.41 mg/dL (H)).  Assessment: 84 year old male on warfarin prior to admission for Afib. INR therapeutic on admission 12/13 at 2.71. Pharmacy has been consulted to manage his warfarin.   INR remains therapeutic (2.78). Has had Coumadin 3 mg daily x 3 days.  No bleeding reported.  Patient reports most recent regimen: 6 mg daily except 3 mg on Mondays and Fridays. Previously took 7.5 mg Monday and Friday, 6 mg other days. Has 6 mg tablets, and was quartering tablets to make 7.5 mg on those days.   Goal of Therapy:  INR 2-3 Monitor platelets by anticoagulation protocol: Yes   Plan:   Coumadin 3 mg again today.  Daily PT/INR for now.  Arty Baumgartner Pager: [phone] or phone: [phone] 01/26/2018 12:37 PM

## 2018-01-26 NOTE — Discharge Instructions (Addendum)
Heart Failure Eating Plan Heart failure, also called congestive heart failure, occurs when your heart does not pump blood well enough to meet your body's needs for oxygen-rich blood. Heart failure is a long-term (chronic) condition. Living with heart failure can be challenging. However, following your health care provider's instructions about a healthy lifestyle and working with a diet and nutrition specialist (dietitian) to choose the right foods may help to improve your symptoms. What are tips for following this plan? General guidelines  Do not eat more than 2,300 mg of salt (sodium) a day. The amount of sodium that is recommended for you may be lower, depending on your condition.  Maintain a healthy body weight as directed. Ask your health care provider what a healthy weight is for you. ? Check your weight every day. ? Work with your health care provider and dietitian to make a plan that is right for you to lose weight or maintain your current weight.  Limit how much fluid you drink. Ask your health care provider or dietitian how much fluid you can have each day.  Limit or avoid alcohol as told by your health care provider or dietitian. Reading food labels  Check food labels for the amount of sodium per serving. Choose foods that have less than 140 mg (milligrams) of sodium in each serving.  Check food labels for the number of calories per serving. This is important if you need to limit your daily calorie intake to lose weight.  Check food labels for the serving size. If you eat more than one serving, you will be eating more sodium and calories than what is listed on the label.  Look for foods that are labeled as "sodium-free," "very low sodium," or "low sodium." ? Foods labeled as "reduced sodium" or "lightly salted" may still have more sodium than what is recommended for you. Cooking  Avoid adding salt when cooking. Ask your health care provider or dietitian before using salt  substitutes.  Season food with salt-free seasonings, spices, or herbs. Check the label of seasoning mixes to make sure they do not contain salt.  Cook with heart-healthy oils, such as olive, canola, soybean, or sunflower oil.  Do not fry foods. Cook foods using low-fat methods, such as baking, boiling, grilling, and broiling.  Limit unhealthy fats when cooking by: ? Removing the skin from poultry, such as chicken. ? Removing all visible fats from meats. ? Skimming the fat off from stews, soups, and gravies before serving them. Meal planning  Limit your intake of: ? Processed, canned, or pre-packaged foods. ? Foods that are high in trans fat, such as fried foods. ? Sweets, desserts, sugary drinks, and other foods with added sugar. ? Full-fat dairy products, such as whole milk.  Eat a balanced diet that includes: ? 4-5 servings of fruit each day and 4-5 servings of vegetables each day. At each meal, try to fill half of your plate with fruits and vegetables. ? Up to 6-8 servings of whole grains each day. ? Up to 2 servings of lean meat, poultry, or fish each day. One serving of meat is equal to 3 oz. This is about the same size as a deck of cards. ? 2 servings of low-fat dairy each day. ? Heart-healthy fats. Healthy fats called omega-3 fatty acids are found in foods such as flaxseed and cold-water fish like sardines, salmon, and mackerel.  Aim to eat 25-35 g (grams) of fiber a day. Foods that are high in fiber include  apples, broccoli, carrots, beans, peas, and whole grains.  Do not add salt or condiments that contain salt (such as soy sauce) to foods before eating.  When eating at a restaurant, ask that your food be prepared with less salt or no salt, if possible.  Try to eat 2 or more vegetarian meals each week.  Eat more home-cooked food and eat less restaurant, buffet, and fast food. Recommended foods The items listed may not be a complete list. Talk with your dietitian about  what dietary choices are best for you. Grains Bread with less than 80 mg of sodium per slice. Whole-wheat pasta, quinoa, and brown rice. Oats and oatmeal. Barley. [place]. Grits and cream of wheat. Whole-grain and whole-wheat cold cereal. Vegetables All fresh vegetables. Vegetables that are frozen without sauce or added salt. Low-sodium or sodium-free canned vegetables. Fruits All fresh, frozen, and canned fruits. Dried fruits, such as raisins, prunes, and cranberries. Meats and other protein foods Lean cuts of meat. Skinless chicken and [place]. Fish with high omega-3 fatty acids, such as salmon, sardines, and other cold-water fishes. Eggs. Dried beans, peas, and edamame. Unsalted nuts and nut butters. Dairy Low-fat or nonfat (skim) milk and dried milk. Rice milk, soy milk, and almond milk. Low-fat or nonfat yogurt. Small amounts of reduced-sodium block cheese. Low-sodium cottage cheese. Fats and oils Olive, canola, soybean, flaxseed, or sunflower oil. Avocado. Sweets and desserts Apple sauce. Granola bars. Sugar-free pudding and gelatin. Frozen fruit bars. Seasoning and other foods Fresh and dried herbs. Lemon or lime juice. Vinegar. Low-sodium ketchup. Salt-free marinades, salad dressings, sauces, and seasonings. Foods to avoid The items listed may not be a complete list. Talk with your dietitian about what dietary choices are best for you. Grains Bread with more than 80 mg of sodium per slice. Hot or cold cereal with more than 140 mg sodium per serving. Salted pretzels and crackers. Pre-packaged breadcrumbs. Bagels, croissants, and biscuits. Vegetables Canned vegetables. Frozen vegetables with sauce or seasonings. Creamed vegetables. [place] fries. Onion rings. Pickled vegetables and sauerkraut. Fruits Fruits that are dried with sodium-containing preservatives. Meats and other protein foods Ribs and chicken wings. Bacon, ham, pepperoni, bologna, salami, and packaged luncheon meats. Hot  dogs, bratwurst, and sausage. Canned meat. Smoked meat and fish. Salted nuts and seeds. Dairy Whole milk, half-and-half, and cream. Buttermilk. Processed cheese, cheese spreads, and cheese curds. Regular cottage cheese. Feta cheese. Shredded cheese. String cheese. Fats and oils Butter, lard, shortening, ghee, and bacon fat. Canned and packaged gravies. Seasoning and other foods Onion salt, garlic salt, table salt, and sea salt. Marinades. Regular salad dressings. Relishes, pickles, and olives. Meat flavorings and tenderizers, and bouillon cubes. Horseradish, ketchup, and mustard. Worcestershire sauce. Teriyaki sauce, soy sauce (including reduced sodium). Hot sauce and Tabasco sauce. Steak sauce, fish sauce, oyster sauce, and cocktail sauce. Taco seasonings. Barbecue sauce. Tartar sauce. Summary  A heart failure eating plan includes changes that limit your intake of sodium and unhealthy fat, and it may help you lose weight or maintain a healthy weight. Your health care provider may also recommend limiting how much fluid you drink.  Most people with heart failure should eat no more than 2,300 mg of salt (sodium) a day. The amount of sodium that is recommended for you may be lower, depending on your condition.  Contact your health care provider or dietitian before making any major changes to your diet. This information is not intended to replace advice given to you by your health care provider. Make sure  you discuss any questions you have with your health care provider. Document Released: 06/14/2016 Document Revised: 06/14/2016 Document Reviewed: 06/14/2016 Elsevier Interactive Patient Education  2018 [place].   Heart Failure Action Plan A heart failure action plan helps you understand what to do when you have symptoms of heart failure. Follow the plan that was created by you and your health care provider. Review your plan each time you visit your health care provider. Red zone These signs  and symptoms mean you should get medical help right away:  You have trouble breathing when resting.  You have a dry cough that is getting worse.  You have swelling or pain in your legs or abdomen that is getting worse.  You suddenly gain more than 2-3 lb (0.9-1.4 kg) in a day, or more than 5 lb (2.3 kg) in one week. This amount may be more or less depending on your condition.  You have trouble staying awake or you feel confused.  You have chest pain.  You do not have an appetite.  You pass out.  If you experience any of these symptoms:  Call your local emergency services (911 in the [place] right away or seek help at the emergency department of the nearest hospital.  Yellow zone These signs and symptoms mean your condition may be getting worse and you should make some changes:  You have trouble breathing when you are active or you need to sleep with extra pillows.  You have swelling in your legs or abdomen.  You gain 2-3 lb (0.9-1.4 kg) in one day, or 5 lb (2.3 kg) in one week. This amount may be more or less depending on your condition.  You get tired easily.  You have trouble sleeping.  You have a dry cough.  If you experience any of these symptoms:  Contact your health care provider within the next day.  Your health care provider may adjust your medicines.  Green zone These signs mean you are doing well and can continue what you are doing:  You do not have shortness of breath.  You have very little swelling or no new swelling.  Your weight is stable (no gain or loss).  You have a normal activity level.  You do not have chest pain or any other new symptoms.  Follow these instructions at home:  Take over-the-counter and prescription medicines only as told by your health care provider.  Weigh yourself daily. Your target weight is __________ lb (__________ kg). ? Call your health care provider if you gain more than __________ lb (__________ kg) in a day, or  more than __________ lb (__________ kg) in one week.  Eat a heart-healthy diet. Work with a diet and nutrition specialist (dietitian) to create an eating plan that is best for you.  Keep all follow-up visits as told by your health care provider. This is important. Where to find more information:  American Heart Association: www.heart.org Summary  Follow the action plan that was created by you and your health care provider.  Get help right away if you have any symptoms in the Red zone. This information is not intended to replace advice given to you by your health care provider. Make sure you discuss any questions you have with your health care provider. Document Released: 03/09/2016 Document Revised: 03/09/2016 Document Reviewed: 03/09/2016 Elsevier Interactive Patient Education  2018 [place] on my medicine - Coumadin   (Warfarin)  Why was Coumadin prescribed for you? Coumadin was prescribed for  you because you have a blood clot or a medical condition that can cause an increased risk of forming blood clots. Blood clots can cause serious health problems by blocking the flow of blood to the heart, lung, or brain. Coumadin can prevent harmful blood clots from forming. As a reminder your indication for Coumadin is:   Stroke Prevention Because Of Atrial Fibrillation  What test will check on my response to Coumadin? While on Coumadin (warfarin) you will need to have an INR test regularly to ensure that your dose is keeping you in the desired range. The INR (international normalized ratio) number is calculated from the result of the laboratory test called prothrombin time (PT).  If an INR APPOINTMENT HAS NOT ALREADY BEEN MADE FOR YOU please schedule an appointment to have this lab work done by your health care provider within 7 days. Your INR goal is usually a number between:  2 to 3 or your provider may give you a more narrow range like 2-2.5.  Ask your health care provider  during an office visit what your goal INR is.  What  do you need to  know  About  COUMADIN? Take Coumadin (warfarin) exactly as prescribed by your healthcare provider about the same time each day.  DO NOT stop taking without talking to the doctor who prescribed the medication.  Stopping without other blood clot prevention medication to take the place of Coumadin may increase your risk of developing a new clot or stroke.  Get refills before you run out.  What do you do if you miss a dose? If you miss a dose, take it as soon as you remember on the same day then continue your regularly scheduled regimen the next day.  Do not take two doses of Coumadin at the same time.  Important Safety Information A possible side effect of Coumadin (Warfarin) is an increased risk of bleeding. You should call your healthcare provider right away if you experience any of the following: ? Bleeding from an injury or your nose that does not stop. ? Unusual colored urine (red or dark brown) or unusual colored stools (red or black). ? Unusual bruising for unknown reasons. ? A serious fall or if you hit your head (even if there is no bleeding).  Some foods or medicines interact with Coumadin (warfarin) and might alter your response to warfarin. To help avoid this: ? Eat a balanced diet, maintaining a consistent amount of Vitamin K. ? Notify your provider about major diet changes you plan to make. ? Avoid alcohol or limit your intake to 1 drink for women and 2 drinks for men per day. (1 drink is 5 oz. wine, 12 oz. beer, or 1.5 oz. liquor.)  Make sure that ANY health care provider who prescribes medication for you knows that you are taking Coumadin (warfarin).  Also make sure the healthcare provider who is monitoring your Coumadin knows when you have started a new medication including herbals and non-prescription products.  Coumadin (Warfarin)  Major Drug Interactions  Increased Warfarin Effect Decreased Warfarin Effect   Alcohol (large quantities) Antibiotics (esp. Septra/Bactrim, Flagyl, Cipro) Amiodarone (Cordarone) Aspirin (ASA) Cimetidine (Tagamet) Megestrol (Megace) NSAIDs (ibuprofen, naproxen, etc.) Piroxicam (Feldene) Propafenone (Rythmol SR) Propranolol (Inderal) Isoniazid (INH) Posaconazole (Noxafil) Barbiturates (Phenobarbital) Carbamazepine (Tegretol) Chlordiazepoxide (Librium) Cholestyramine (Questran) Griseofulvin Oral Contraceptives Rifampin Sucralfate (Carafate) Vitamin K   Coumadin (Warfarin) Major Herbal Interactions  Increased Warfarin Effect Decreased Warfarin Effect  Garlic Ginseng Ginkgo biloba Coenzyme Q10 Green tea [place]  wort    Coumadin (Warfarin) FOOD Interactions  Eat a consistent number of servings per week of foods HIGH in Vitamin K (1 serving =  cup)  Collards (cooked, or boiled & drained) Kale (cooked, or boiled & drained) Mustard greens (cooked, or boiled & drained) Parsley *serving size only =  cup Spinach (cooked, or boiled & drained) Swiss chard (cooked, or boiled & drained) Turnip greens (cooked, or boiled & drained)  Eat a consistent number of servings per week of foods MEDIUM-HIGH in Vitamin K (1 serving = 1 cup)  Asparagus (cooked, or boiled & drained) Broccoli (cooked, boiled & drained, or raw & chopped) Brussel sprouts (cooked, or boiled & drained) *serving size only =  cup Lettuce, raw (green leaf, endive, romaine) Spinach, raw Turnip greens, raw & chopped   These websites have more information on Coumadin (warfarin):  FailFactory.se; VeganReport.com.au;

## 2018-01-26 NOTE — Progress Notes (Signed)
PROGRESS NOTE  Jamol Ginyard UMP:[id number] DOB: 07-Mar-1926 DOA: 01/23/2018 PCP: [name], DO  HPI/Recap of past 24 hours: Daylan Juhnke is an 84 year old male with a medical history significant forbrain aneurysm, chronic combined systolic and diastolic heart failure, hypertension, chronic kidney disease stage III, COPD, CAD, hyperlipidemia, mild aortic stenosis and regurgitation, moderate pulmonary hypertension, chronic atrial fibrillation, history of biventricular ICD placement, thyroid disease who is coming to the emergency department due to progressively worse weakness, decreased appetite and shortness of breath following nosesurgery last week.He denies chest pain, palpitations, diaphoresis, pedal edema, but complains of PND, orthopnea.  Admitted for acute hypoxic respiratory failure secondary to acute on chronic systolic CHF.  01/26/2018: Patient seen and examined his bedside.  No acute events overnight.  Reports persistent dyspnea with minimal movement.  Denies chest pain.  2D echo done on admission revealed worsening aortic insufficiency with moderate to severe aortic stenosis.  Cardiology consulted and following.  Assessment/Plan: Principal Problem:   Acute on chronic systolic heart failure (HCC) Active Problems:   Persistent atrial fibrillation ([place]); CHA2DS2Vasc = ~4 (CHF, MI/aortic plaque, Age 76)   CKD (chronic kidney disease), stage III (HCC)   Elevated troponin   Aortic stenosis, mild   Hyperlipidemia with target LDL less than 70   Essential hypertension   Coronary artery disease involving native heart with angina pectoris (HCC)   COPD (chronic obstructive pulmonary disease) (HCC)   Acute respiratory failure with hypoxemia (HCC)   CHF (congestive heart failure) (HCC)  Acute hypoxic respiratory failure secondary to acute on chronic systolic CHF Continue to diuresis Maintain O2 saturation greater than 92% Continue current medications Last 2D echo done  on admission revealed LVEF 35 to 40% with diffuse hypokinesis and moderate to severe aortic stenosis Cardiology consulted and following Strict I's and O's and daily weight  Acute on chronic systolic CHF Management as stated above  Moderate to severe aortic stenosis Patient is interested in TAVR Cardiology following  Chronic A. fib on warfarin Currently INR is therapeutic Rate controlled on metoprolol  AKI on CKD 3 Baseline creatinine 1.8 with GFR of 31 Presented with GFR 23 and creatinine of 2.4 Avoid nephrotoxic agents/dehydration/hypotension Repeat BMP in the morning  Hyperlipidemia Continue statin  Elevated troponin Suspect demand ischemia Peaked at 0.17 and trended down and plateaued Cardiology following    Code Status: DNR  Family Communication: None at bedside  Disposition Plan: Home when clinically stable and when cardiology signs off   Consultants:  Cardiology  Procedures:  None  Antimicrobials:  None  DVT prophylaxis: Coumadin   Objective: Vitals:   01/26/18 0030 01/26/18 0511 01/26/18 1047 01/26/18 1150  BP: 126/62 123/75 113/77 105/70  Pulse: 66 (!) 59 (!) 57 (!) 57  Resp: 20 20  17   Temp: 98.5 F (36.9 C) (!) 97.3 F (36.3 C)  98 F (36.7 C)  TempSrc: Oral Oral  Oral  SpO2: 98% 92% 96% 94%  Weight:  83.5 kg    Height:        Intake/Output Summary (Last 24 hours) at 01/26/2018 1520 Last data filed at 01/26/2018 1300 Gross per 24 hour  Intake 1560 ml  Output 1825 ml  Net -265 ml   Filed Weights   01/23/18 0451 01/25/18 0525 01/26/18 0511  Weight: 91 kg 88.5 kg 83.5 kg    Exam:  . General: 84 y.o. year-old male well developed well nourished in no acute distress.  Alert and oriented x3. . Cardiovascular: Regular rate and rhythm with no  rubs or gallops.  No thyromegaly or JVD noted.   Marland Kitchen Respiratory: Mild Rales at bases with no wheezes. Good inspiratory effort. . Abdomen: Soft nontender nondistended with normal bowel sounds  x4 quadrants. . Musculoskeletal: No lower extremity edema. 2/4 pulses in all 4 extremities. . Skin: No ulcerative lesions noted or rashes, . Psychiatry: Mood is appropriate for condition and setting   Data Reviewed: CBC: Recent Labs  Lab 01/23/18 0458  WBC 4.6  NEUTROABS 2.8  HGB 11.5*  HCT 38.0*  MCV 101.1*  PLT 073   Basic Metabolic Panel: Recent Labs  Lab 01/23/18 0458 01/24/18 0548 01/25/18 0413 01/26/18 0436  NA 141 141 139 136  K 3.9 3.9 3.9 3.6  CL 103 102 96* 92*  CO2 25 29 30 27   GLUCOSE 110* 106* 121* 117*  BUN 43* 41* 40* 44*  CREATININE 2.03* 2.00* 2.18* 2.41*  CALCIUM 10.0 9.8 10.2 10.4*   GFR: Estimated Creatinine Clearance: 21.9 mL/min (A) (by C-G formula based on SCr of 2.41 mg/dL (H)). Liver Function Tests: No results for input(s): AST, ALT, ALKPHOS, BILITOT, PROT, ALBUMIN in the last 168 hours. No results for input(s): LIPASE, AMYLASE in the last 168 hours. No results for input(s): AMMONIA in the last 168 hours. Coagulation Profile: Recent Labs  Lab 01/23/18 0458 01/24/18 0548 01/25/18 0413 01/26/18 0436  INR 2.71 2.91 3.00 2.78   Cardiac Enzymes: Recent Labs  Lab 01/23/18 0556 01/23/18 1205 01/23/18 1751  TROPONINI 0.17* 0.15* 0.15*   BNP (last 3 results) No results for input(s): PROBNP in the last 8760 hours. HbA1C: No results for input(s): HGBA1C in the last 72 hours. CBG: No results for input(s): GLUCAP in the last 168 hours. Lipid Profile: No results for input(s): CHOL, HDL, LDLCALC, TRIG, CHOLHDL, LDLDIRECT in the last 72 hours. Thyroid Function Tests: No results for input(s): TSH, T4TOTAL, FREET4, T3FREE, THYROIDAB in the last 72 hours. Anemia Panel: No results for input(s): VITAMINB12, FOLATE, FERRITIN, TIBC, IRON, RETICCTPCT in the last 72 hours. Urine analysis:    Component Value Date/Time   COLORURINE YELLOW 02/21/2017 0103   APPEARANCEUR CLEAR 02/21/2017 0103   LABSPEC 1.013 02/21/2017 0103   PHURINE 5.0  02/21/2017 0103   GLUCOSEU NEGATIVE 02/21/2017 0103   HGBUR SMALL (A) 02/21/2017 0103   BILIRUBINUR NEGATIVE 02/21/2017 0103   KETONESUR NEGATIVE 02/21/2017 0103   PROTEINUR NEGATIVE 02/21/2017 0103   NITRITE NEGATIVE 02/21/2017 0103   LEUKOCYTESUR NEGATIVE 02/21/2017 0103   Sepsis Labs: @LABRCNTIP (procalcitonin:4,lacticidven:4)  ) Recent Results (from the past 240 hour(s))  MRSA PCR Screening     Status: None   Collection Time: 01/25/18  5:17 AM  Result Value Ref Range Status   MRSA by PCR NEGATIVE NEGATIVE Final    Comment:        The GeneXpert MRSA Assay (FDA approved for NASAL specimens only), is one component of a comprehensive MRSA colonization surveillance program. It is not intended to diagnose MRSA infection nor to guide or monitor treatment for MRSA infections. Performed at [facility], [place] [address] [place], [place] [address]       Studies: No results found.  Scheduled Meds: . aspirin  81 mg Oral Daily  . bumetanide  2 mg Oral Daily  . hydrocortisone cream   Topical BID  . levETIRAcetam  500 mg Oral BID  . levothyroxine  50 mcg Oral Q0600  . metolazone  2.5 mg Oral Q7 days  . metoprolol succinate  25 mg Oral Daily  . pantoprazole  40 mg Oral Daily  . potassium chloride  10 mEq Oral Daily  . simvastatin  20 mg Oral q1800  . warfarin  3 mg Oral ONCE-1800  . Warfarin - Pharmacist Dosing Inpatient   Does not apply q1800    Continuous Infusions:   LOS: 1 day     Kayleen Memos, MD [facility] Pager [phone]  If 7PM-7AM, please contact night-coverage [email or web address] Password [email or web address] 01/26/2018, 3:20 PM

## 2018-01-26 NOTE — Clinical Social Work Note (Signed)
Clinical Social Work Assessment  Patient Details  Name: Kenneth Bell MRN: [id number] Date of Birth: March 20, 1926  Date of referral:  01/26/18               Reason for consult:  Discharge Planning                Permission sought to share information with:  Chartered certified accountant granted to share information::  Yes, Verbal Permission Granted  Name::        Agency::  [facility]  Relationship::     Contact Information:     Housing/Transportation Living arrangements for the past 2 months:  [place] of Information:  Patient, Medical Team, Facility Patient Interpreter Needed:  None Criminal Activity/Legal Involvement Pertinent to Current Situation/Hospitalization:  No - Comment as needed Significant Relationships:  Adult Children Lives with:  Facility Resident Do you feel safe going back to the place where you live?  Yes Need for family participation in patient care:  Yes (Comment)  Care giving concerns:  Patient is a resident at [facility].   Social Worker assessment / plan:  CSW met with patient. No supports at bedside. CSW introduced role and explained that discharge planning would be discussed. Patient confirmed he is a resident at [place] and plans to return there at discharge. CSW obtained fax number to send discharge information once stable. No further concerns. CSW encouraged patient to contact CSW as needed. CSW will continue to follow patient for support and facilitate discharge back to ALF once medically stable.  Employment status:  Retired Forensic scientist:  Medicare PT Recommendations:  Not assessed at this time [place] / Referral to community resources:  Other (Comment Required)(Plan is to return to ALF.)  Patient/Family's Response to care:  Patient agreeable to return to ALF. Patient's daughter supportive and involved in patient's care. Patient appreciated  social work intervention.  Patient/Family's Understanding of and Emotional Response to Diagnosis, Current Treatment, and Prognosis:  Patient has a good understanding of the reason for admission and plan to return to ALF at discharge. Patient appears happy with hospital care.  Emotional Assessment Appearance:  Appears stated age Attitude/Demeanor/Rapport:  Engaged, Gracious Affect (typically observed):  Accepting, Appropriate, Calm, Pleasant Orientation:  Oriented to Self, Oriented to Place, Oriented to  Time, Oriented to Situation Alcohol / Substance use:  Never Used Psych involvement (Current and /or in the community):  No (Comment)  Discharge Needs  Concerns to be addressed:  Care Coordination Readmission within the last 30 days:  No Current discharge risk:  None Barriers to Discharge:  Continued Medical Work up   Candie Chroman, LCSW 01/26/2018, 11:38 AM

## 2018-01-26 NOTE — Progress Notes (Signed)
Progress Note  Patient Name: Kenneth Bell Date of Encounter: 01/26/2018  Primary Cardiologist: Glenetta Hew, MD   Subjective   No acute events  Inpatient Medications    Scheduled Meds: . aspirin  81 mg Oral Daily  . furosemide  80 mg Intravenous BID  . hydrocortisone cream   Topical BID  . levETIRAcetam  500 mg Oral BID  . levothyroxine  50 mcg Oral Q0600  . metolazone  5 mg Oral Daily  . pantoprazole  40 mg Oral Daily  . potassium chloride  10 mEq Oral Daily  . simvastatin  20 mg Oral q1800  . Warfarin - Pharmacist Dosing Inpatient   Does not apply q1800   Continuous Infusions:  PRN Meds: acetaminophen **OR** acetaminophen, hydrocortisone cream, ondansetron **OR** ondansetron (ZOFRAN) IV   Vital Signs    Vitals:   01/25/18 1647 01/25/18 1937 01/26/18 0030 01/26/18 0511  BP: 122/72 127/75 126/62 123/75  Pulse: (!) 59 (!) 59 66 (!) 59  Resp: 19 (!) 24 20   Temp: 98.5 F (36.9 C) (!) 97.5 F (36.4 C) 98.5 F (36.9 C) (!) 97.3 F (36.3 C)  TempSrc: Oral Oral Oral Oral  SpO2: 98% 98% 98% 92%  Weight:      Height:        Intake/Output Summary (Last 24 hours) at 01/26/2018 0512 Last data filed at 01/26/2018 0029 Gross per 24 hour  Intake 480 ml  Output 1975 ml  Net -1495 ml   Filed Weights   01/23/18 0451 01/25/18 0525  Weight: 91 kg 88.5 kg    Telemetry    V paced- Personally Reviewed  ECG    V paced, afib- Personally Reviewed  Physical Exam   GEN: No acute distress.   Neck: No JVD Cardiac: regular rhythm, normal rate, 3/6 systolic ejection murmur with preserved S2 no rubs, or gallops.  Respiratory: basilar rales bilaterally. GI: Soft, nontender, non-distended  MS: No edema; No deformity. Neuro:  Nonfocal  Psych: Normal affect   Labs    Chemistry Recent Labs  Lab 01/23/18 0458 01/24/18 0548 01/25/18 0413  NA 141 141 139  K 3.9 3.9 3.9  CL 103 102 96*  CO2 25 29 30   GLUCOSE 110* 106* 121*  BUN 43* 41* 40*  CREATININE 2.03*  2.00* 2.18*  CALCIUM 10.0 9.8 10.2  GFRNONAA 28* 29* 26*  GFRAA 33* 33* 30*  ANIONGAP 13 10 13      Hematology Recent Labs  Lab 01/23/18 0458  WBC 4.6  RBC 3.76*  HGB 11.5*  HCT 38.0*  MCV 101.1*  MCH 30.6  MCHC 30.3  RDW 14.9  PLT 204    Cardiac Enzymes Recent Labs  Lab 01/23/18 0556 01/23/18 1205 01/23/18 1751  TROPONINI 0.17* 0.15* 0.15*    Recent Labs  Lab 01/23/18 0521  TROPIPOC 0.20*     BNP Recent Labs  Lab 01/23/18 0458  BNP 869.8*     DDimer No results for input(s): DDIMER in the last 168 hours.   Radiology    No results found.  Cardiac Studies   No new  Patient Profile     Kenneth Bell is a 84 y.o. male with a hx of aortic stenosis COPD ischemic cardiomyopathy coronary artery disease who is being seen today for the evaluation of shortness of breath at the request of Dr. Evangeline Gula.  Assessment & Plan   Principal Problem:   Acute on chronic systolic heart failure (HCC) Active Problems:   Persistent atrial fibrillation ([place]); CHA2DS2Vasc = ~  4 (CHF, MI/aortic plaque, Age 50)   CKD (chronic kidney disease), stage III (HCC)   Elevated troponin   Aortic stenosis, mild   Hyperlipidemia with target LDL less than 70   Essential hypertension   Coronary artery disease involving native heart with angina pectoris (HCC)   COPD (chronic obstructive pulmonary disease) (HCC)   Acute respiratory failure with hypoxemia (HCC)   CHF (congestive heart failure) (HCC)   Acute on chronic systolic HF - he is diuresing appropriately on lasix 80 IV BID, monitor renal function.  He appears to be approaching euvolemia, consider transition to oral agents tomorrow.  Mod-Severe AS - by exam, S2 is preserved. I answered his family's questions to the best of my ability.  We discussed in detail further testing that may be needed to determine the severity of his aortic stenosis, indications for open valve replacement as well as TAVR, and procedural details of TAVR.  I  discussed with the family and the patient that it may be best to make this determination after he is recovered from his acute heart failure when he can meet with the TAVR team and discuss in more detail.      For questions or updates, please contact [place] Please consult [email or web address] for contact info under        Signed, Elouise Munroe, MD  01/26/2018, 5:12 AM

## 2018-01-26 NOTE — Progress Notes (Signed)
Progress Note  Patient Name: Kenneth Bell Date of Encounter: 01/26/2018  Primary Cardiologist: Glenetta Hew, MD   Subjective   Had 24 beats of NSVT around 12:45 this AM. He does not recall symptoms with this. He denies any chest pain or shortness of breath. Feels fluid has come off well. He was taking bumex 2 mg daily at home and reports good urine output on this.  Inpatient Medications    Scheduled Meds: . aspirin  81 mg Oral Daily  . furosemide  80 mg Intravenous BID  . hydrocortisone cream   Topical BID  . levETIRAcetam  500 mg Oral BID  . levothyroxine  50 mcg Oral Q0600  . metolazone  5 mg Oral Daily  . pantoprazole  40 mg Oral Daily  . potassium chloride  10 mEq Oral Daily  . simvastatin  20 mg Oral q1800  . Warfarin - Pharmacist Dosing Inpatient   Does not apply q1800   Continuous Infusions:  PRN Meds: acetaminophen **OR** acetaminophen, hydrocortisone cream, ondansetron **OR** ondansetron (ZOFRAN) IV   Vital Signs    Vitals:   01/25/18 1647 01/25/18 1937 01/26/18 0030 01/26/18 0511  BP: 122/72 127/75 126/62 123/75  Pulse: (!) 59 (!) 59 66 (!) 59  Resp: 19 (!) 24 20 20   Temp: 98.5 F (36.9 C) (!) 97.5 F (36.4 C) 98.5 F (36.9 C) (!) 97.3 F (36.3 C)  TempSrc: Oral Oral Oral Oral  SpO2: 98% 98% 98% 92%  Weight:    83.5 kg  Height:        Intake/Output Summary (Last 24 hours) at 01/26/2018 0906 Last data filed at 01/26/2018 0854 Gross per 24 hour  Intake 720 ml  Output 1750 ml  Net -1030 ml   Filed Weights   01/23/18 0451 01/25/18 0525 01/26/18 0511  Weight: 91 kg 88.5 kg 83.5 kg    Telemetry    V paced. 24 beats of NSVT at 12:44 AM- Personally Reviewed  ECG    No new since 12/14- Personally Reviewed  Physical Exam   GEN: No acute distress.  Scar/scab over bridge of nose Neck: JVD about 9 cm (just above clavicle at 45 degrees) Cardiac: regular rhythm, normal rate, 3/6 systolic ejection murmur with preserved S2 no rubs, or gallops.    Respiratory: Clear bilaterally except for slightly diminished breat sounds at bases GI: Soft, nontender, non-distended  MS: No edema; No deformity. Neuro:  Nonfocal  Psych: Normal affect   Labs    Chemistry Recent Labs  Lab 01/24/18 0548 01/25/18 0413 01/26/18 0436  NA 141 139 136  K 3.9 3.9 3.6  CL 102 96* 92*  CO2 29 30 27   GLUCOSE 106* 121* 117*  BUN 41* 40* 44*  CREATININE 2.00* 2.18* 2.41*  CALCIUM 9.8 10.2 10.4*  GFRNONAA 29* 26* 23*  GFRAA 33* 30* 27*  ANIONGAP 10 13 17*     Hematology Recent Labs  Lab 01/23/18 0458  WBC 4.6  RBC 3.76*  HGB 11.5*  HCT 38.0*  MCV 101.1*  MCH 30.6  MCHC 30.3  RDW 14.9  PLT 204    Cardiac Enzymes Recent Labs  Lab 01/23/18 0556 01/23/18 1205 01/23/18 1751  TROPONINI 0.17* 0.15* 0.15*    Recent Labs  Lab 01/23/18 0521  TROPIPOC 0.20*     BNP Recent Labs  Lab 01/23/18 0458  BNP 869.8*     DDimer No results for input(s): DDIMER in the last 168 hours.   Radiology    No results found.  Cardiac Studies   Echo 01/24/18 Left ventricle: The cavity size was normal. Wall thickness was   increased in a pattern of moderate LVH. Systolic function was   moderately reduced. The estimated ejection fraction was in the   range of 35% to 40%. Diffuse hypokinesis. There is akinesis of   the basalinferior myocardium. There is hypokinesis of the   inferolateral myocardium. Indeterminate diastolic function. - Aortic valve: Trileaflet; moderately calcified leaflets. There   was probable moderate to severe low gradient stenosis. There was   trivial regurgitation. Mean gradient (S): 14 mm Hg. Peak gradient   (S): 22 mm Hg. Valve area (VTI): 1.1 cm^2. Valve area (Vmax):   1.21 cm^2. - Aortic root: The aortic root was mildly ectatic. - Ascending aorta: The ascending aorta was mildly dilated. - Mitral valve: Mildly calcified annulus. There was mild   regurgitation. - Left atrium: The atrium was moderately dilated. -  Right ventricle: Pacer wire or catheter noted in right ventricle.   Systolic function was mildly reduced. - Right atrium: The atrium was mildly dilated. - Atrial septum: No defect or patent foramen ovale was identified. - Tricuspid valve: There was mild regurgitation. - Pulmonary arteries: PA peak pressure: 49 mm Hg (S). - Pericardium, extracardiac: There was no pericardial effusion.  Patient Profile     Doyal Saric is a 84 y.o. male with a hx of aortic stenosis (moderate to severe), COPD, ischemic cardiomyopathy, coronary artery disease who is being seen today for the evaluation of shortness of breath at the request of Dr. Evangeline Gula. Findings consistent with acute on chronic systolic heart failure.  Assessment & Plan   Principal Problem:   Acute on chronic systolic heart failure (HCC) Active Problems:   Persistent atrial fibrillation (HCC); CHA2DS2Vasc = ~4 (CHF, MI/aortic plaque, Age 69)   CKD (chronic kidney disease), stage III (HCC)   Elevated troponin   Aortic stenosis, mild   Hyperlipidemia with target LDL less than 70   Essential hypertension   Coronary artery disease involving native heart with angina pectoris (HCC)   COPD (chronic obstructive pulmonary disease) (HCC)   Acute respiratory failure with hypoxemia (HCC)   CHF (congestive heart failure) (HCC)   Acute on chronic systolic HF -  Appears nearly euvolemic. He reports good urine output to bumex at home -will change from IV lasix to oral bumex today and monitor output -changed metolazone to 2.5 mg every Monday, which is his home dose -had NSVT overnight, would supplement K to 4. -Weight went from 91 kg on admission to 83.5 today. Net negative 4.6 L -Cr 2.41, was 2.18 yesterday -no chest pain, attributing elevated troponin to heart failure and CKD -was on metoprolol at home, but only daily dose of tartrate for unclear reasons. Due to HFrEF, will change this to metoprolol succinate  -no ACEi/ARB given history of  hypotension, CKD with elevation in Cr  Atrial fibrillation, Chadsvasc=4 -chronic pacing -on warfarin, pharmacy dosing  CAD: no chest pain -on aspirin, simvastatin  Mod-Severe AS -  -preserved S2 -Per Dr. Delphina Cahill note yesterday, plan is for outpatient evaluation of aortic stenosis and discussion of options. He tells me today that he would not be interested in surgery and only in TAVR.   TIME SPENT WITH PATIENT: 15 minutes of direct patient care. More than 50% of that time was spent on coordination of care and counseling regarding heart failure, aortic stenosis.  Buford Dresser, MD, PhD [facility]   For questions or updates, please  contact [place] Please consult [email or web address] for contact info under     Signed, Buford Dresser, MD  01/26/2018, 9:06 AM

## 2018-01-27 ENCOUNTER — Telehealth: Payer: Self-pay | Admitting: Cardiology

## 2018-01-27 LAB — BASIC METABOLIC PANEL
Anion gap: 16 — ABNORMAL HIGH (ref 5–15)
BUN: 49 mg/dL — ABNORMAL HIGH (ref 8–23)
CALCIUM: 9.9 mg/dL (ref 8.9–10.3)
CO2: 29 mmol/L (ref 22–32)
Chloride: 90 mmol/L — ABNORMAL LOW (ref 98–111)
Creatinine, Ser: 2.52 mg/dL — ABNORMAL HIGH (ref 0.61–1.24)
GFR calc Af Amer: 25 mL/min — ABNORMAL LOW (ref >60)
GFR, EST NON AFRICAN AMERICAN: 22 mL/min — AB (ref >60)
Glucose, Bld: 105 mg/dL — ABNORMAL HIGH (ref 70–99)
Potassium: 3.7 mmol/L (ref 3.5–5.1)
SODIUM: 135 mmol/L (ref 135–145)

## 2018-01-27 LAB — PROTIME-INR
INR: 2.6
Prothrombin Time: 27.5 seconds — ABNORMAL HIGH (ref 11.4–15.2)

## 2018-01-27 LAB — MAGNESIUM: Magnesium: 1.9 mg/dL (ref 1.7–2.4)

## 2018-01-27 MED ORDER — BUMETANIDE 2 MG PO TABS: 2.0000 mg | ORAL_TABLET | Freq: Every day | ORAL | 0 refills | Status: DC

## 2018-01-27 MED ORDER — POTASSIUM CHLORIDE CRYS ER 20 MEQ PO TBCR
20.0000 meq | EXTENDED_RELEASE_TABLET | Freq: Once | ORAL | Status: AC
  Administered 2018-01-27: 20 meq via ORAL
  Filled 2018-01-27: qty 1

## 2018-01-27 MED ORDER — METOLAZONE 2.5 MG PO TABS: 2.5000 mg | ORAL_TABLET | ORAL | 0 refills | Status: DC

## 2018-01-27 NOTE — Progress Notes (Signed)
Called CCMD to to inform of telemetry dc for dc home

## 2018-01-27 NOTE — Progress Notes (Signed)
Progress Note  Patient Name: Kenneth Bell Date of Encounter: 01/27/2018  Primary Cardiologist: Glenetta Hew, MD   Subjective   Feeling well today. Up and ambulating around room. Looking forward to going home.   Inpatient Medications    Scheduled Meds: . aspirin  81 mg Oral Daily  . bumetanide  2 mg Oral Daily  . hydrocortisone cream   Topical BID  . levETIRAcetam  500 mg Oral BID  . levothyroxine  50 mcg Oral Q0600  . metolazone  2.5 mg Oral Q7 days  . metoprolol succinate  25 mg Oral Daily  . pantoprazole  40 mg Oral Daily  . potassium chloride  10 mEq Oral Daily  . simvastatin  20 mg Oral q1800  . Warfarin - Pharmacist Dosing Inpatient   Does not apply q1800   Continuous Infusions:  PRN Meds: acetaminophen **OR** acetaminophen, hydrocortisone cream, ondansetron **OR** ondansetron (ZOFRAN) IV   Vital Signs    Vitals:   01/26/18 1150 01/26/18 2013 01/27/18 0219 01/27/18 0537  BP: 105/70 103/63  (!) 105/56  Pulse: (!) 57 60  (!) 59  Resp: 17 20  16   Temp: 98 F (36.7 C) 97.7 F (36.5 C)  (!) 97.3 F (36.3 C)  TempSrc: Oral Oral  Oral  SpO2: 94% 91%  94%  Weight:   83.7 kg   Height:        Intake/Output Summary (Last 24 hours) at 01/27/2018 1006 Last data filed at 01/27/2018 0936 Gross per 24 hour  Intake 960 ml  Output 1025 ml  Net -65 ml   Filed Weights   01/25/18 0525 01/26/18 0511 01/27/18 0219  Weight: 88.5 kg 83.5 kg 83.7 kg    Telemetry    V paced- Personally Reviewed  ECG    No new since 12/14- Personally Reviewed  Physical Exam   GEN: No acute distress.  Scar/scab over bridge of nose Neck: JVD flat at 90 degrees Cardiac: regular rhythm, normal rate, 3/6 systolic ejection murmur with preserved S2 no rubs, or gallops.  Respiratory: Clear bilaterally except for slightly diminished breath sounds at bases GI: Soft, nontender, non-distended  MS: No edema; No deformity. Neuro:  Nonfocal  Psych: Normal affect   Labs     Chemistry Recent Labs  Lab 01/25/18 0413 01/26/18 0436 01/27/18 0438  NA 139 136 135  K 3.9 3.6 3.7  CL 96* 92* 90*  CO2 30 27 29   GLUCOSE 121* 117* 105*  BUN 40* 44* 49*  CREATININE 2.18* 2.41* 2.52*  CALCIUM 10.2 10.4* 9.9  GFRNONAA 26* 23* 22*  GFRAA 30* 27* 25*  ANIONGAP 13 17* 16*     Hematology Recent Labs  Lab 01/23/18 0458  WBC 4.6  RBC 3.76*  HGB 11.5*  HCT 38.0*  MCV 101.1*  MCH 30.6  MCHC 30.3  RDW 14.9  PLT 204    Cardiac Enzymes Recent Labs  Lab 01/23/18 0556 01/23/18 1205 01/23/18 1751  TROPONINI 0.17* 0.15* 0.15*    Recent Labs  Lab 01/23/18 0521  TROPIPOC 0.20*     BNP Recent Labs  Lab 01/23/18 0458  BNP 869.8*     DDimer No results for input(s): DDIMER in the last 168 hours.   Radiology    No results found.  Cardiac Studies   Echo 01/24/18 Left ventricle: The cavity size was normal. Wall thickness was   increased in a pattern of moderate LVH. Systolic function was   moderately reduced. The estimated ejection fraction was in the  range of 35% to 40%. Diffuse hypokinesis. There is akinesis of   the basalinferior myocardium. There is hypokinesis of the   inferolateral myocardium. Indeterminate diastolic function. - Aortic valve: Trileaflet; moderately calcified leaflets. There   was probable moderate to severe low gradient stenosis. There was   trivial regurgitation. Mean gradient (S): 14 mm Hg. Peak gradient   (S): 22 mm Hg. Valve area (VTI): 1.1 cm^2. Valve area (Vmax):   1.21 cm^2. - Aortic root: The aortic root was mildly ectatic. - Ascending aorta: The ascending aorta was mildly dilated. - Mitral valve: Mildly calcified annulus. There was mild   regurgitation. - Left atrium: The atrium was moderately dilated. - Right ventricle: Pacer wire or catheter noted in right ventricle.   Systolic function was mildly reduced. - Right atrium: The atrium was mildly dilated. - Atrial septum: No defect or patent foramen ovale  was identified. - Tricuspid valve: There was mild regurgitation. - Pulmonary arteries: PA peak pressure: 49 mm Hg (S). - Pericardium, extracardiac: There was no pericardial effusion.  Patient Profile     Kenneth Bell is a 84 y.o. male with a hx of aortic stenosis (moderate to severe), COPD, ischemic cardiomyopathy, coronary artery disease who is being seen today for the evaluation of shortness of breath at the request of Dr. Evangeline Gula. Findings consistent with acute on chronic systolic heart failure.  Assessment & Plan   Principal Problem:   Acute on chronic systolic heart failure (HCC) Active Problems:   Persistent atrial fibrillation (HCC); CHA2DS2Vasc = ~4 (CHF, MI/aortic plaque, Age 41)   CKD (chronic kidney disease), stage III (HCC)   Elevated troponin   Aortic stenosis, mild   Hyperlipidemia with target LDL less than 70   Essential hypertension   Coronary artery disease involving native heart with angina pectoris (HCC)   COPD (chronic obstructive pulmonary disease) (HCC)   Acute respiratory failure with hypoxemia (HCC)   CHF (congestive heart failure) (HCC)   Acute on chronic systolic HF -  Euvolemic. -Good urine output on bumex, continue -changed metolazone to 2.5 mg every Monday, which is his home dose -Weight went from 91 kg on admission to 83.7 today. Net negative about 4L -Cr 2.41, was 2.18 yesterday -no chest pain, attributing elevated troponin to heart failure and CKD -was on metoprolol at home, but only daily dose of tartrate for unclear reasons. Due to HFrEF, changed to metoprolol succinate this admission -no ACEi/ARB given history of hypotension, CKD with elevation in Cr  Atrial fibrillation, Chadsvasc=4 -chronic pacing -on warfarin, pharmacy dosing  CAD: no chest pain -on aspirin, simvastatin  Mod-Severe AS -  -preserved S2 -Per Dr. Delphina Cahill note, plan is for outpatient evaluation of aortic stenosis and discussion of options. He tells me today that he  would not be interested in surgery and only in TAVR.   TIME SPENT WITH PATIENT: 15 minutes of direct patient care. More than 50% of that time was spent on coordination of care and counseling regarding heart failure, aortic stenosis.  [facility] will sign off in anticipation of discharge.   Medication Recommendations:  As ordered Other recommendations (labs, testing, etc):  BMET in 1 week with PCP Follow up as an outpatient:  We are arranging follow up with Dr. Allison Quarry office.  Buford Dresser, MD, PhD [facility]   For questions or updates, please contact [place] Please consult [email or web address] for contact info under     Signed, Buford Dresser, MD  01/27/2018, 10:06 AM

## 2018-01-27 NOTE — NC FL2 (Addendum)
[place] LEVEL OF CARE SCREENING TOOL     IDENTIFICATION  Patient Name: Kenneth Bell Birthdate: May 30, 1926 Sex: male Admission Date (Current Location): 01/23/2018  [facility] and [place] Number:  Herbalist and Address:  The . [facility], [place] [address], [place], [place] [address]      Provider Number: [id number]  Attending Physician Name and Address:  Kayleen Memos, DO  Relative Name and Phone Number:       Current Level of Care: Hospital Recommended Level of Care: [place] Prior Approval Number:    Date Approved/Denied:   PASRR Number:    Discharge Plan: Other (Comment)(ALF)    Current Diagnoses: Patient Active Problem List   Diagnosis Date Noted  . CHF (congestive heart failure) ([place]) 01/25/2018  . Acute respiratory failure with hypoxemia ([place]) 01/24/2018  . Acute on chronic systolic heart failure ([place]) 01/23/2018  . Pain in both forearms 01/14/2018  . Cerebral thrombosis with cerebral infarction 02/21/2017  . Cerebral embolism with cerebral infarction 02/21/2017  . Subarachnoid hemorrhage 02/21/2017  . Confusion 02/20/2017  . Brain aneurysm   . Thyroid disease   . Hyperlipidemia with target LDL less than 70   . Essential hypertension   . Coronary artery disease involving native heart with angina pectoris ([place])   . COPD (chronic obstructive pulmonary disease) ([place])   . Aortic stenosis, mild 09/13/2016  . Ischemic cardiomyopathy 08/28/2016  . Moderate pulmonary arterial systolic hypertension ([place]) 08/28/2016  . Mild aortic insufficiency 08/28/2016  . Elevated troponin   . Chronic combined systolic and diastolic heart failure, NYHA class 3 ([name]) 08/26/2016  . CKD (chronic kidney disease), stage III ([place]) 08/26/2016  . Persistent atrial fibrillation ([place]); CHA2DS2Vasc = ~4 (CHF, MI/aortic plaque, Age 27) 08/21/2016  . Biventricular ICD (implantable cardioverter-defibrillator) in place 09/13/2012  .  Coronary artery disease involving coronary bypass graft of native heart with angina pectoris ([place]) 08/26/2012  . Presence of biventricular implantable cardioverter-defibrillator (ICD) 02/11/2006    Orientation RESPIRATION BLADDER Height & Weight     Self, Time, Situation, Place  Normal Continent Weight: 184 lb 8 oz (83.7 kg) Height:  5\' 8"  (172.7 cm)  BEHAVIORAL SYMPTOMS/MOOD NEUROLOGICAL BOWEL NUTRITION STATUS  (None)   Continent Diet(Heart healthy)  AMBULATORY STATUS COMMUNICATION OF NEEDS Skin   Limited Assist Verbally Bruising, Other (Comment)(Rash.)                       Personal Care Assistance Level of Assistance              Functional Limitations Info  Sight, Hearing, Speech Sight Info: Adequate Hearing Info: Adequate Speech Info: Adequate    SPECIAL CARE FACTORS FREQUENCY                       Contractures Contractures Info: Not present    Additional Factors Info  Code Status, Allergies Code Status Info: DNR Allergies Info: Lipitor (Atorvastatin), Nitroglycerin.           Current Medications (01/27/2018):  This is the current hospital active medication list Current Facility-Administered Medications  Medication Dose Route Frequency Provider Last Rate Last Dose  . acetaminophen (TYLENOL) tablet 650 mg  650 mg Oral Q6H PRN Reubin Milan, MD   650 mg at 01/24/18 0037   Or  . acetaminophen (TYLENOL) suppository 650 mg  650 mg Rectal Q6H PRN Reubin Milan, MD      . aspirin  chewable tablet 81 mg  81 mg Oral Daily Reubin Milan, MD   81 mg at 01/27/18 1101  . bumetanide (BUMEX) tablet 2 mg  2 mg Oral Daily Buford Dresser, MD   2 mg at 01/27/18 1100  . hydrocortisone cream 1 %   Topical BID Corinda Gubler, [facility]   1 application at 81/27/51 1100  . hydrocortisone cream 1 %   Topical PRN Corinda Gubler, RPH      . levETIRAcetam (KEPPRA) tablet 500 mg  500 mg Oral BID Reubin Milan, MD   500 mg at 01/27/18 1100   . levothyroxine (SYNTHROID, LEVOTHROID) tablet 50 mcg  50 mcg Oral Q0600 Reubin Milan, MD   50 mcg at 01/27/18 [phone]  . metolazone (ZAROXOLYN) tablet 2.5 mg  2.5 mg Oral Q7 days Buford Dresser, MD   2.5 mg at 01/26/18 1045  . metoprolol succinate (TOPROL-XL) 24 hr tablet 25 mg  25 mg Oral Daily Buford Dresser, MD   25 mg at 01/27/18 1101  . ondansetron (ZOFRAN) tablet 4 mg  4 mg Oral Q6H PRN Reubin Milan, MD       Or  . ondansetron [facility]) injection 4 mg  4 mg Intravenous Q6H PRN Reubin Milan, MD      . pantoprazole (PROTONIX) EC tablet 40 mg  40 mg Oral Daily Reubin Milan, MD   40 mg at 01/27/18 1101  . potassium chloride (K-DUR,KLOR-CON) CR tablet 10 mEq  10 mEq Oral Daily Reubin Milan, MD   10 mEq at 01/27/18 1100  . simvastatin (ZOCOR) tablet 20 mg  20 mg Oral q1800 Reubin Milan, MD   20 mg at 01/26/18 1730  . Warfarin - Pharmacist Dosing Inpatient   Does not apply q1800 Reubin Milan, MD         Discharge Medications: TAKE these medications   aspirin 81 MG chewable tablet Chew 81 mg by mouth daily.   bumetanide 2 MG tablet Commonly known as:  BUMEX Take 1 tablet (2 mg total) by mouth daily. Start taking on:  January 28, 2018 What changed:  Another medication with the same name was removed. Continue taking this medication, and follow the directions you see here.   cyanocobalamin 1000 MCG tablet Take 1 tablet (1,000 mcg total) by mouth daily.   levETIRAcetam 500 MG tablet Commonly known as:  KEPPRA TAKE 1 TABLET BY MOUTH TWICE DAILY   levothyroxine 50 MCG tablet Commonly known as:  SYNTHROID, LEVOTHROID TAKE 1 TABLET BY MOUTH ONCE DAILY What changed:  when to take this   metolazone 2.5 MG tablet Commonly known as:  ZAROXOLYN Take 1 tablet (2.5 mg total) by mouth every 7 (seven) days. Start taking on:  February 02, 2018 What changed:    how much to take  how to take this  when to take  this  additional instructions   metoprolol tartrate 25 MG tablet Commonly known as:  LOPRESSOR Take 25 mg by mouth daily.   omeprazole 20 MG capsule Commonly known as:  PRILOSEC TAKE 1 CAPSULE BY MOUTH ONCE DAILY   potassium chloride 10 MEQ tablet Commonly known as:  K-DUR Take 10 mEq by mouth daily.   simvastatin 20 MG tablet Commonly known as:  ZOCOR TAKE 1 TABLET BY MOUTH ONCE DAILY IN THE EVENING What changed:  when to take this   warfarin 6 MG tablet Commonly known as:  COUMADIN Take as directed. If you are unsure how to  take this medication, talk to your nurse or doctor. Original instructions:  Take everyday except Mondays and Fridays. On Mondays and Fridays, take 7.5mg  (separate rx). Updated 10/28/17. What changed:    how much to take  how to take this  when to take this  additional instructions     Relevant Imaging Results:  Relevant Lab Results:   Additional Information SS#: [id number]  Candie Chroman, LCSW

## 2018-01-27 NOTE — Progress Notes (Signed)
SATURATION QUALIFICATIONS: (This note is used to comply with regulatory documentation for home oxygen)  Patient Saturations on Room Air at Rest = 97 %  Patient Saturations on Room Air while Ambulating = 93 %  Patient Saturations on Liters of oxygen while Ambulating = NA  Please briefly explain why patient needs home oxygen:

## 2018-01-27 NOTE — Telephone Encounter (Signed)
New message   Per [place] [facility] Appt with Rosaria Ferries on 02/12/2018 at 9:00 am.

## 2018-01-27 NOTE — Telephone Encounter (Signed)
Currently admitted.

## 2018-01-27 NOTE — Evaluation (Signed)
Physical Therapy Evaluation Patient Details Name: Kenneth Bell MRN: [id number] DOB: 06-07-1926 Today's Date: 01/27/2018   History of Present Illness  Patient is a 84 y/o male presenting to the ED on  01/23/18 with primary complaints of weakness and SOB. Past medical history significant of brain aneurysm, chronic combined systolic and diastolic heart failure, hypertension, chronic kidney disease stage III, COPD, CAD, hyperlipidemia, mild aortic stenosis and regurgitation, moderate pulmonary hypertension, chronic atrial fibrillation, history of biventricular ICD placement, thyroid disease. Admitted for Acute on chronic systolic heart failure.    Clinical Impression  Kenneth Bell is a very pleasant 84 y/o male admitted with the above listed diagnosis. Patient reports use of RW at baseline. Performing bed level mobility, transfers and gait with RW at general min guard level assist for safety - does not require physical assist for balance or mobility. PT to follow acutely to maximize safe and independent functional mobility prior to discharge. Do not anticipate patient to need PT services at discharge.     Follow Up Recommendations No PT follow up;Supervision - Intermittent    Equipment Recommendations  None recommended by PT    Recommendations for Other Services       Precautions / Restrictions Precautions Precautions: Fall Restrictions Weight Bearing Restrictions: No      Mobility  Bed Mobility Overal bed mobility: Needs Assistance Bed Mobility: Supine to Sit     Supine to sit: Supervision     General bed mobility comments: for safety  Transfers Overall transfer level: Needs assistance Equipment used: Rolling walker (2 wheeled) Transfers: Sit to/from Stand Sit to Stand: Min guard         General transfer comment: for safety and immediate standing balance - no LOB  Ambulation/Gait Ambulation/Gait assistance: Min guard Gait Distance (Feet): 150 Feet Assistive device:  Rolling walker (2 wheeled) Gait Pattern/deviations: Step-through pattern;Decreased stride length;Trunk flexed Gait velocity: decreased   General Gait Details: use of RW for stability; no LOB; cueing for safety with turning  Stairs            Wheelchair Mobility    Modified Rankin (Stroke Patients Only)       Balance Overall balance assessment: Mild deficits observed, not formally tested                                           Pertinent Vitals/Pain Pain Assessment: No/denies pain    Home Living Family/patient expects to be discharged to:: Assisted living               Home Equipment: Walker - 4 wheels      Prior Function Level of Independence: Independent with assistive device(s)         Comments: use of RW for mobility; would walk to dining hall, did not require assist for ADLs     Hand Dominance   Dominant Hand: Right    Extremity/Trunk Assessment   Upper Extremity Assessment Upper Extremity Assessment: Overall WFL for tasks assessed    Lower Extremity Assessment Lower Extremity Assessment: Generalized weakness    Cervical / Trunk Assessment Cervical / Trunk Assessment: Kyphotic  Communication   Communication: No difficulties  Cognition Arousal/Alertness: Awake/alert Behavior During Therapy: WFL for tasks assessed/performed Overall Cognitive Status: Within Functional Limits for tasks assessed  General Comments General comments (skin integrity, edema, etc.): SpO2 at rest with 1L O2 - 97%; SpO2 on RA with mobility 92-94%, all other VSS    Exercises     Assessment/Plan    PT Assessment Patient needs continued PT services  PT Problem List Decreased strength;Decreased activity tolerance;Decreased balance;Decreased mobility;Decreased safety awareness       PT Treatment Interventions DME instruction;Gait training;Functional mobility training;Therapeutic  activities;Therapeutic exercise;Balance training;Patient/family education    PT Goals (Current goals can be found in the Care Plan section)  Acute Rehab PT Goals Patient Stated Goal: return to ALF PT Goal Formulation: With patient Time For Goal Achievement: 02/10/18 Potential to Achieve Goals: Good    Frequency Min 3X/week   Barriers to discharge        Co-evaluation               AM-PAC PT "6 Clicks" Mobility  Outcome Measure Help needed turning from your back to your side while in a flat bed without using bedrails?: A Little Help needed moving from lying on your back to sitting on the side of a flat bed without using bedrails?: A Little Help needed moving to and from a bed to a chair (including a wheelchair)?: A Little Help needed standing up from a chair using your arms (e.g., wheelchair or bedside chair)?: A Little Help needed to walk in hospital room?: A Little Help needed climbing 3-5 steps with a railing? : A Little 6 Click Score: 18    End of Session Equipment Utilized During Treatment: Gait belt Activity Tolerance: Patient tolerated treatment well Patient left: in chair;with call bell/phone within reach Nurse Communication: Mobility status PT Visit Diagnosis: Unsteadiness on feet (R26.81);Other abnormalities of gait and mobility (R26.89);Muscle weakness (generalized) (M62.81)    Time: 0370-9643 PT Time Calculation (min) (ACUTE ONLY): 15 min   Charges:   PT Evaluation $PT Eval Moderate Complexity: 1 Mod          Lanney Gins, PT, DPT Supplemental Physical Therapist 01/27/18 12:39 PM Pager: [phone] Office: [phone]

## 2018-01-27 NOTE — Progress Notes (Signed)
ANTICOAGULATION CONSULT NOTE - Follow Up Consult  Pharmacy Consult for Coumadin Indication: atrial fibrillation  Allergies  Allergen Reactions  . Lipitor  [Atorvastatin]   . Nitroglycerin Other (See Comments)    DROPS BLOOD PRESSURE DRASTICALLY Other reaction(s): Other (See Comments) BP drops    Patient Measurements: Height: 5\' 8"  (172.7 cm) Weight: 184 lb 8 oz (83.7 kg) IBW/kg (Calculated) : 68.4   Vital Signs: Temp: 97.4 F (36.3 C) (12/17 1059) Temp Source: Oral (12/17 1059) BP: 114/66 (12/17 1059) Pulse Rate: 59 (12/17 1059)  Labs: Recent Labs    01/25/18 0413 01/26/18 0436 01/27/18 0438  LABPROT 30.7* 28.9* 27.5*  INR 3.00 2.78 2.60  CREATININE 2.18* 2.41* 2.52*    Estimated Creatinine Clearance: 20.9 mL/min (A) (by C-G formula based on SCr of 2.52 mg/dL (H)).  Assessment: 84 year old male on warfarin prior to admission for Afib. INR therapeutic on admission 12/13 at 2.71. Pharmacy has been consulted to manage his warfarin.   INR remains therapeutic (2.60). Has had Coumadin 3 mg daily x 4 days. INR now trending down some.  Recent removal of skin cancer on nose (01/15/18). He reports using Vaseline to keep moist, no bleeding.  Patient reports most recent regimen: 6 mg daily except 3 mg on Mondays and Fridays. Previously took 7.5 mg Monday and Friday, 6 mg other days. Has 6 mg tablets, and was quartering tablets to make 7.5 mg on those days.   Goal of Therapy:  INR 2-3 Monitor platelets by anticoagulation protocol: Yes   Plan:   Expecting discharge back to AL facility today.  Would continue Coumadin 6 mg daily except 3 mg on Mondays and Fridays.   Arty Baumgartner, [place] Pager: [phone] or phone: [phone] 01/27/2018 11:17 AM

## 2018-01-27 NOTE — Discharge Summary (Signed)
Discharge Summary  Kenneth Bell FGH:[id number] DOB: [birth year]  PCP: [name], DO  Admit date: 01/23/2018 Discharge date: 01/27/2018  Time spent: 35 minutes   Recommendations for Outpatient Follow-up:  1. F/u with your cardiologist, Dr Allison Quarry office 2. F/U with your PCP 3. Take your medications as presribed   Discharge Diagnoses:  Active Hospital Problems   Diagnosis Date Noted  . Acute on chronic systolic heart failure ([place]) 01/23/2018  . CHF (congestive heart failure) ([place]) 01/25/2018  . Acute respiratory failure with hypoxemia ([place]) 01/24/2018  . COPD (chronic obstructive pulmonary disease) ([place])   . Essential hypertension   . Hyperlipidemia with target LDL less than 70   . Coronary artery disease involving native heart with angina pectoris ([place])   . Aortic stenosis, mild 09/13/2016  . Elevated troponin   . CKD (chronic kidney disease), stage III ([place]) 08/26/2016  . Persistent atrial fibrillation ([place]); CHA2DS2Vasc = ~4 (CHF, MI/aortic plaque, Age 55) 08/21/2016    Resolved Hospital Problems  No resolved problems to display.    Discharge Condition: Stable  Diet recommendation: Resume prior diet. Heart healthy diet  Vitals:   01/27/18 0537 01/27/18 1059  BP: (!) 105/56 114/66  Pulse: (!) 59 (!) 59  Resp: 16 18  Temp: (!) 97.3 F (36.3 C) (!) 97.4 F (36.3 C)  SpO2: 94% 97%    History of present illness:  Kenneth Bell is an 84 year old male with a medical history significant forbrain aneurysm, chronic combined systolic and diastolic heart failure, hypertension, chronic kidney disease stage III, COPD, CAD, hyperlipidemia, mild aortic stenosis and regurgitation, moderate pulmonary hypertension, chronic atrial fibrillation, history of biventricular ICD placement, thyroid disease who is coming to the emergency department due to progressively worse weakness, decreased appetite and shortness of breath following nosesurgery last week.He denies  chest pain, palpitations, diaphoresis, pedal edema, but complains of PND, orthopnea.  Admitted for acute hypoxic respiratory failure secondary to acute on chronic systolic CHF.  01/26/2018: 2D echo done on admission revealed worsening aortic insufficiency with moderate to severe aortic stenosis.  Cardiology consulted and following.  01/27/18: No new complaints. Denies chest pain or dyspnea. Wants to go home.  On the day of dc the patient was hemodynamically stable and will need to follow up with cardiology and his PCP post hospitalization.    Hospital Course:  Principal Problem:   Acute on chronic systolic heart failure (HCC) Active Problems:   Persistent atrial fibrillation (HCC); CHA2DS2Vasc = ~4 (CHF, MI/aortic plaque, Age 61)   CKD (chronic kidney disease), stage III (HCC)   Elevated troponin   Aortic stenosis, mild   Hyperlipidemia with target LDL less than 70   Essential hypertension   Coronary artery disease involving native heart with angina pectoris (HCC)   COPD (chronic obstructive pulmonary disease) (HCC)   Acute respiratory failure with hypoxemia (HCC)   CHF (congestive heart failure) (HCC)  Acute hypoxic respiratory failure secondary to acute on chronic systolic CHF Continue to diuresis Maintain O2 saturation greater than 92% Continue current medications Last 2D echo done on admission revealed LVEF 35 to 40% with diffuse hypokinesis and moderate to severe aortic stenosis Cardiology consulted and followed Will see outpatient Rhonda Barrett 02/12/2018 at 9:00 am  Acute on chronic systolic CHF Management as stated above  Moderate to severe aortic stenosis Patient is interested in TAVR F/u with cardiology outpatient  Chronic A. fib on warfarin Currently INR is therapeutic Rate controlled on metoprolol  AKI on CKD 3 Baseline creatinine 1.84 with GFR  of 74 Presented with GFR 23 and creatinine of 2.4 U/O 1275 last 24 hours F/u with your PCP Avoid  nephrotoxic agents  Hyperlipidemia Continue statin  Elevated troponin Suspect demand ischemia Peaked at 0.17 and trended down and plateaued     Code Status: DNR     Consultants:  Cardiology  Procedures:  None  Antimicrobials:  None  DVT prophylaxis: Coumadin    Discharge Exam: BP 114/66 (BP Location: Right Arm)   Pulse (!) 59   Temp (!) 97.4 F (36.3 C) (Oral)   Resp 18   Ht 5\' 8"  (1.727 m)   Wt 83.7 kg   SpO2 97%   BMI 28.05 kg/m  . General: 84 y.o. year-old male well developed well nourished in no acute distress.  Alert and oriented x3. . Cardiovascular: Regular rate and rhythm with no rubs or gallops.  No thyromegaly or JVD noted.  Systolic murmur garde 3/6. Marland Kitchen Respiratory: Clear to auscultation with no wheezes or rales. Good inspiratory effort. . Abdomen: Soft nontender nondistended with normal bowel sounds x4 quadrants. . Musculoskeletal: No lower extremity edema. 2/4 pulses in all 4 extremities. . Skin: No ulcerative lesions noted or rashes, . Psychiatry: Mood is appropriate for condition and setting  Discharge Instructions You were cared for by a hospitalist during your hospital stay. If you have any questions about your discharge medications or the care you received while you were in the hospital after you are discharged, you can call the unit and asked to speak with the hospitalist on call if the hospitalist that took care of you is not available. Once you are discharged, your primary care physician will handle any further medical issues. Please note that NO REFILLS for any discharge medications will be authorized once you are discharged, as it is imperative that you return to your primary care physician (or establish a relationship with a primary care physician if you do not have one) for your aftercare needs so that they can reassess your need for medications and monitor your lab values.   Allergies as of 01/27/2018      Reactions    Lipitor  [atorvastatin]    Nitroglycerin Other (See Comments)   DROPS BLOOD PRESSURE DRASTICALLY Other reaction(s): Other (See Comments) BP drops      Medication List    TAKE these medications   aspirin 81 MG chewable tablet Chew 81 mg by mouth daily.   bumetanide 2 MG tablet Commonly known as:  BUMEX Take 1 tablet (2 mg total) by mouth daily. Start taking on:  January 28, 2018 What changed:  Another medication with the same name was removed. Continue taking this medication, and follow the directions you see here.   cyanocobalamin 1000 MCG tablet Take 1 tablet (1,000 mcg total) by mouth daily.   levETIRAcetam 500 MG tablet Commonly known as:  KEPPRA TAKE 1 TABLET BY MOUTH TWICE DAILY   levothyroxine 50 MCG tablet Commonly known as:  SYNTHROID, LEVOTHROID TAKE 1 TABLET BY MOUTH ONCE DAILY What changed:  when to take this   metolazone 2.5 MG tablet Commonly known as:  ZAROXOLYN Take 1 tablet (2.5 mg total) by mouth every 7 (seven) days. Start taking on:  February 02, 2018 What changed:    how much to take  how to take this  when to take this  additional instructions   metoprolol tartrate 25 MG tablet Commonly known as:  LOPRESSOR Take 25 mg by mouth daily.   omeprazole 20 MG capsule Commonly known  as:  PRILOSEC TAKE 1 CAPSULE BY MOUTH ONCE DAILY   potassium chloride 10 MEQ tablet Commonly known as:  K-DUR Take 10 mEq by mouth daily.   simvastatin 20 MG tablet Commonly known as:  ZOCOR TAKE 1 TABLET BY MOUTH ONCE DAILY IN THE EVENING What changed:  when to take this   warfarin 6 MG tablet Commonly known as:  COUMADIN Take as directed. If you are unsure how to take this medication, talk to your nurse or doctor. Original instructions:  Take everyday except Mondays and Fridays. On Mondays and Fridays, take 7.5mg  (separate rx). Updated 10/28/17. What changed:    how much to take  how to take this  when to take this  additional instructions       Allergies  Allergen Reactions  . Lipitor  [Atorvastatin]   . Nitroglycerin Other (See Comments)    DROPS BLOOD PRESSURE DRASTICALLY Other reaction(s): Other (See Comments) BP drops   Follow-up Information    Barrett, Evelene Croon, PA-C Follow up.   Specialties:  Cardiology, Radiology Why:  Cardiology hospital follow-up on 02/12/2018 at 9:00 AM.  Please arrive 15 minutes early for check-in. Contact information: [address] [place] [address] [place], [place], DO. Call in 1 day(s).   Specialty:  Family Medicine Why:  please call for a post hospital follow up appointment Contact information: [place] [address] [name] [address] [phone]        Leonie Man, MD .   Specialty:  Cardiology Contact information: [address] [place] [address] [phone]            The results of significant diagnostics from this hospitalization (including imaging, microbiology, ancillary and laboratory) are listed below for reference.    Significant Diagnostic Studies: Dg Chest 2 View  Result Date: 01/23/2018 CLINICAL DATA:  84 y/o M; weakness, decreased appetite, fever, shortness of breath. EXAM: CHEST - 2 VIEW COMPARISON:  01/22/2018 CT chest. FINDINGS: Stable cardiomegaly given projection and technique. 4 AICD leads. Aortic atherosclerosis with calcification. Diffuse reticular and peripheral linear opacities of the lungs. Left upper lobe ground-glass nodule described on CT chest is poorly visualized radiographically. Small bilateral effusions. No pneumothorax. No acute osseous abnormality is evident. Post median sternotomy with wires in alignment. IMPRESSION: Diffuse reticular and peripheral linear opacities of the lungs probably representing interstitial pulmonary edema. Small bilateral effusions. Stable cardiomegaly. No focal consolidation. Electronically Signed   By: Kristine Garbe M.D.    On: 01/23/2018 06:04   Ct Chest W Contrast  Result Date: 01/22/2018 CLINICAL DATA:  Followup of lung nodule, history of chronic kidney disease EXAM: CT CHEST WITH CONTRAST TECHNIQUE: Multidetector CT imaging of the chest was performed during intravenous contrast administration. CONTRAST:  11mL ISOVUE-300 IOPAMIDOL (ISOVUE-300) INJECTION 61% COMPARISON:  CT chest of 02/23/2017 FINDINGS: Cardiovascular: Sgnificant thoracic aortic atherosclerotic change is present. Again an aberrant right subclavian artery is noted coursing posterior to the esophagus. The thoracic aorta is not well opacified but no acute abnormality is seen. There is better opacification of the pulmonary arteries. No evidence of pulmonary embolism is seen. Cardiomegaly is present and is stable. Changes of prior CABG are present with median sternotomy sutures noted. Mediastinum/Nodes: No mediastinal or hilar adenopathy is seen. Only small mediastinal lymph nodes are present. There is a rounded low-attenuation nodule in the right thyroid gland of approximately 1.9 cm and ultrasound of the thyroid may be  helpful to evaluate further if warranted clinically in this age patient. Lungs/Pleura: The ground-glass opacity within the posterior aspect of the right upper lobe appears slightly larger measuring 22 x 20 x 19 mm compared to 20 x 20 x 17 mm. Also there are soft tissue components within this ground-glass opacity. Follow-up non-contrast CT recommended at 3-6 months to confirm persistence. If unchanged, and solid component remains <6 mm, annual CT is recommended until 5 years of stability has been established. If persistent these nodules should be considered highly suspicious if the solid component of the nodule is 6 mm or greater in size and enlarging. This recommendation follows the consensus statement: Guidelines for Management of Incidental Pulmonary Nodules Detected on CT Images: From the Fleischner Society 2017; Radiology 2017; 284:228-243. No  additional lung nodule is seen. There are small pleural effusions bilaterally with bibasilar dependent atelectasis as well. Somewhat prominent interstitial markings are noted primarily at the lung bases which may indicate a degree of interstitial edema. There is mild cardiomegaly present. The central airway is patent. Upper Abdomen: There is a simple appearing cyst emanating from the upper pole of the right kidney anteromedially. Also an apparent cyst is noted within the left lobe of liver lateral segment. No other abnormality within the upper abdomen is evident on the limited views obtained. Musculoskeletal: There is significant thoracic kyphoscoliosis present with diffuse degenerative change. No acute compression deformity is seen. IMPRESSION: 1. Slight increase in size of the partly solid ground-glass opacity within the posterior right upper lobe now measuring 2.2 x 2.0 x 1.9 cm compared to 2.0 x 2.0 x 1.7 cm previously. Follow-up non-contrast CT recommended at 3-6 months to confirm persistence. If unchanged, and solid component remains <6 mm, annual CT is recommended until 5 years of stability has been established. If persistent this nodule should be considered highly suspicious if the solid component of the nodule is 6 mm or greater in size and enlarging. This recommendation follows the consensus statement: Guidelines for Management of Incidental Pulmonary Nodules Detected on CT Images: From the Fleischner Society 2017; Radiology 2017; 284:228-243. 2. Significant thoracic aortic atherosclerosis. Variation of aberrant right subclavian artery. 3. Small effusions and somewhat prominent interstitial markings at the lung bases may indicate a degree of interstitial edema. Electronically Signed   By: Ivar Drape M.D.   On: 01/22/2018 13:21    Microbiology: Recent Results (from the past 240 hour(s))  MRSA PCR Screening     Status: None   Collection Time: 01/25/18  5:17 AM  Result Value Ref Range Status   MRSA  by PCR NEGATIVE NEGATIVE Final    Comment:        The GeneXpert MRSA Assay (FDA approved for NASAL specimens only), is one component of a comprehensive MRSA colonization surveillance program. It is not intended to diagnose MRSA infection nor to guide or monitor treatment for MRSA infections. Performed at [facility], [place] [address], [place], [place] [address]      Labs: Basic Metabolic Panel: Recent Labs  Lab 01/23/18 0458 01/24/18 0548 01/25/18 0413 01/26/18 0436 01/27/18 0438  NA 141 141 139 136 135  K 3.9 3.9 3.9 3.6 3.7  CL 103 102 96* 92* 90*  CO2 25 29 30 27 29   GLUCOSE 110* 106* 121* 117* 105*  BUN 43* 41* 40* 44* 49*  CREATININE 2.03* 2.00* 2.18* 2.41* 2.52*  CALCIUM 10.0 9.8 10.2 10.4* 9.9  MG  --   --   --   --  1.9   Liver Function Tests: No results for input(s): AST, ALT, ALKPHOS, BILITOT, PROT, ALBUMIN in the last 168 hours. No results for input(s): LIPASE, AMYLASE in the last 168 hours. No results for input(s): AMMONIA in the last 168 hours. CBC: Recent Labs  Lab 01/23/18 0458  WBC 4.6  NEUTROABS 2.8  HGB 11.5*  HCT 38.0*  MCV 101.1*  PLT 204   Cardiac Enzymes: Recent Labs  Lab 01/23/18 0556 01/23/18 1205 01/23/18 1751  TROPONINI 0.17* 0.15* 0.15*   BNP: BNP (last 3 results) Recent Labs    02/20/17 2310 12/23/17 1401 01/23/18 0458  BNP 458.2* 716.5* 869.8*    ProBNP (last 3 results) No results for input(s): PROBNP in the last 8760 hours.  CBG: No results for input(s): GLUCAP in the last 168 hours.     Signed:  Kayleen Memos, MD [facility] 01/27/2018, 2:06 PM

## 2018-01-27 NOTE — Clinical Social Work Note (Addendum)
Faxed FL2 and discharge summary to ALF. Erasmo Downer will review and call CSW back to determine if any changes need to be made or if patient can go ahead and return. Patient not on oxygen at the facility so will need to be weaned to room air from 1 L.  Dayton Scrape, CSW [phone]  2:51 pm Erasmo Downer did not receive faxes. CSW faxed again.  Dayton Scrape, [place]

## 2018-01-27 NOTE — Progress Notes (Signed)
Pt had 12 beat run of vtach. Pt is asymptomatic. Levada Dy, RN notified. MD notified. No new orders.

## 2018-01-27 NOTE — Clinical Social Work Note (Signed)
CSW facilitated patient discharge including contacting patient family and facility to confirm patient discharge plans. Clinical information faxed to facility and family agreeable with plan. Patient's daughter will transport him by car back to [facility]. RN to call report prior to discharge [phone]).  CSW will sign off for now as social work intervention is no longer needed. Please consult [place] again if new needs arise.  Dayton Scrape, [place]

## 2018-01-29 ENCOUNTER — Telehealth: Payer: Self-pay

## 2018-01-29 ENCOUNTER — Other Ambulatory Visit: Payer: Self-pay | Admitting: Family

## 2018-01-29 NOTE — Telephone Encounter (Signed)
Left message to call back  

## 2018-01-29 NOTE — Telephone Encounter (Signed)
01/29/18   Transition Care Management Follow-up Telephone Call  ADMISSION DATE: 01/23/18  DISCHARGE DATE: 01/27/18  How have you been since you were released from the hospital? Feeling better per patient.   Do you understand why you were in the hospital?  Yes   Do you understand the discharge instrcutions? Yes    Items Reviewed:  Medications reviewed: Patient states he is on the same medications that are on "the list".   Allergies reviewed: Yes   Dietary changes reviewed:   Referrals reviewed: Appointment has been scheduled.   Functional Questionnaire:   Activities of Daily Living (ADLs): Patient  states he can perform all independently.  Any patient concerns? May have problems with transportation since he is in [place]. (Daughter handles)    Confirmed importance and date/time of follow-up visits scheduled: Yes   Confirmed with patient if condition begins to worsen call PCP or go to the ER. Yes    Patient was given the office number and encouragred to call back with questions or concerns. Yes

## 2018-01-30 NOTE — Telephone Encounter (Signed)
Patient contacted regarding discharge from 01/23/2018 - 01/27/2018 (4 days) [facility]  Patient understands to follow up with provider Rosaria Ferries on 02/12/2018 at 9:00am at [facility] Patient understands discharge instructions? YES Patient understands medications and regiment? YES Patient understands to bring all medications to this visit? YES  Pt states that he understands all d/c instructions/medication and has no questions at this time. He will cb if he has any issues before appt.

## 2018-02-02 ENCOUNTER — Ambulatory Visit (INDEPENDENT_AMBULATORY_CARE_PROVIDER_SITE_OTHER): Payer: Medicare Other | Admitting: Family Medicine

## 2018-02-02 ENCOUNTER — Telehealth: Payer: Self-pay | Admitting: Family Medicine

## 2018-02-02 ENCOUNTER — Encounter: Payer: Self-pay | Admitting: Family Medicine

## 2018-02-02 VITALS — BP 120/70 | HR 62 | Temp 97.5°F | Ht 68.0 in | Wt 190.1 lb

## 2018-02-02 DIAGNOSIS — I509 Heart failure, unspecified: Secondary | ICD-10-CM

## 2018-02-02 DIAGNOSIS — I480 Paroxysmal atrial fibrillation: Secondary | ICD-10-CM

## 2018-02-02 LAB — CBC
HEMATOCRIT: 38.6 % — AB (ref 39.0–52.0)
Hemoglobin: 12.8 g/dL — ABNORMAL LOW (ref 13.0–17.0)
MCHC: 33.2 g/dL (ref 30.0–36.0)
MCV: 95.8 fl (ref 78.0–100.0)
Platelets: 255 10*3/uL (ref 150.0–400.0)
RBC: 4.03 Mil/uL — ABNORMAL LOW (ref 4.22–5.81)
RDW: 14.5 % (ref 11.5–15.5)
WBC: 4 10*3/uL (ref 4.0–10.5)

## 2018-02-02 LAB — BASIC METABOLIC PANEL
BUN: 64 mg/dL — ABNORMAL HIGH (ref 6–23)
CO2: 35 mEq/L — ABNORMAL HIGH (ref 19–32)
Calcium: 10.3 mg/dL (ref 8.4–10.5)
Chloride: 96 mEq/L (ref 96–112)
Creatinine, Ser: 2 mg/dL — ABNORMAL HIGH (ref 0.40–1.50)
GFR: 33.54 mL/min — ABNORMAL LOW (ref >60.00)
GLUCOSE: 144 mg/dL — AB (ref 70–99)
Potassium: 3.8 mEq/L (ref 3.5–5.1)
Sodium: 139 mEq/L (ref 135–145)

## 2018-02-02 NOTE — Progress Notes (Signed)
Pre visit review using our clinic review tool, if applicable. No additional management support is needed unless otherwise documented below in the visit note. 

## 2018-02-02 NOTE — Patient Instructions (Addendum)
Give [place] 2-3 business days to get the results of your labs back.   Mind your salt intake.  Stay active.  Make sure to follow up with cardiology team.  Let me know if you change your mind about the tetanus shot and/or flu shot.  Let us know if you need anything.

## 2018-02-02 NOTE — Telephone Encounter (Signed)
I have not seen anything. 6 mg daily except 7.5 mg on Mon and Fri's. Ty.

## 2018-02-02 NOTE — Telephone Encounter (Signed)
[employer] informed of PCP instructions. She verbalized understanding/had already received the patients AVS from [facility] today 02/02/2018 with those instructions.

## 2018-02-02 NOTE — Telephone Encounter (Signed)
Copied from [place] [id number]. Topic: Quick Communication - See Telephone Encounter >> Jan 30, 2018  2:26 PM Percell Belt A wrote: CRM for notification. See Telephone encounter for: 01/30/18.  [place] calling from [facility] [place] , state she fax over a form that need to be filled out to clarify the pts coumadin.  They need to have it on file.  Pt was just discharged from hosp and has an appt on Monday the 23rd   Fax number  [phone] Phone number  [phone]

## 2018-02-02 NOTE — Progress Notes (Signed)
Chief Complaint  Patient presents with  . Hospitalization Follow-up    HPI Kenneth Bell is a 84 y.o. y.o. male who presents for a transition of care visit.  Pt was discharged from [facility] on 01/27/18.  Within 48 business hours of discharge our office contacted him via telephone to coordinate his care and needs.   Admitted for CHF exacerbation. Lost 10 lbs of fluid, breathing much better but still not 100%.  He is minding his salt intake and trying to stay active.  He is compliant with his medication.  He has a follow-up with the cardiology team on 02/12/2017.  Echo did show worsening function of contractility and aortic valve.  Not having any chest pain or lower extremity swelling.   Past Medical History:  Diagnosis Date  . Brain aneurysm   . Chronic combined systolic and diastolic heart failure, NYHA class 3 (HCC)    EF has seemed to vary report-to-report. Myoview 2007 showed EF 30%, echo in 2016 7 EF 60-65% -> Echo in 08/2015 & 08/2016  40-45%  (with basal-mid inferoseptal & apical inferolateral wall), Mod P HTN (PAP ~53 mHg) --most recent in January 2019 says EF 45-50%.  . CKD (chronic kidney disease), stage III ([place]) 08/26/2016  . COPD (chronic obstructive pulmonary disease) ([place])   . Coronary artery disease involving native heart with angina pectoris ([place])    a. MI 2002 - PCI to [place]. b. 2005: PCI to LAD, OM1 & OM2.  c. s/p CABG in 2013: LIMA to LAD, SVG to OM2, SVG to PDA and SVG to RI. ; d. Cath 2017: occluded LAD, OM1 & OM2, SVG-RI with patent LIMA-LAD, SVG-rPDA & SVG-OM2.   . Essential hypertension   . Hyperlipidemia with target LDL less than 70   . Mild aortic stenosis 08/28/2016   Stenosis with mild regurgitation.  . Moderate pulmonary arterial systolic hypertension ([place]) 08/28/2016   PA pressures F has been estimated at 53-59 mmHg on echo-   . Persistent atrial fibrillation   . Presence of biventricular implantable cardioverter-defibrillator (ICD) 2008  . Thyroid disease    Family  History  Problem Relation Age of Onset  . Arthritis Mother   . Heart disease Father    Allergies as of 02/02/2018      Reactions   Lipitor  [atorvastatin]    Nitroglycerin Other (See Comments)   DROPS BLOOD PRESSURE DRASTICALLY Other reaction(s): Other (See Comments) BP drops      Medication List       Accurate as of February 02, 2018 11:58 AM. Always use your most recent med list.        aspirin 81 MG chewable tablet Chew 81 mg by mouth daily.   bumetanide 2 MG tablet Commonly known as:  BUMEX Take 1 tablet (2 mg total) by mouth daily.   cyanocobalamin 1000 MCG tablet Take 1 tablet (1,000 mcg total) by mouth daily.   levETIRAcetam 500 MG tablet Commonly known as:  KEPPRA TAKE 1 TABLET BY MOUTH TWICE DAILY   levothyroxine 50 MCG tablet Commonly known as:  SYNTHROID, LEVOTHROID TAKE 1 TABLET BY MOUTH ONCE DAILY   metolazone 2.5 MG tablet Commonly known as:  ZAROXOLYN Take 1 tablet (2.5 mg total) by mouth every 7 (seven) days.   metoprolol tartrate 25 MG tablet Commonly known as:  LOPRESSOR Take 25 mg by mouth daily.   omeprazole 20 MG capsule Commonly known as:  PRILOSEC TAKE 1 CAPSULE BY MOUTH ONCE DAILY   potassium chloride 10 MEQ tablet  Commonly known as:  K-DUR Take 10 mEq by mouth daily.   simvastatin 20 MG tablet Commonly known as:  ZOCOR TAKE 1 TABLET BY MOUTH ONCE DAILY IN THE EVENING   warfarin 7.5 MG tablet Commonly known as:  COUMADIN Take as directed by the anticoagulation clinic. If you are unsure how to take this medication, talk to your nurse or doctor. Original instructions:  Take 1 tab on Mondays and Fridays.   warfarin 6 MG tablet Commonly known as:  COUMADIN Take as directed by the anticoagulation clinic. If you are unsure how to take this medication, talk to your nurse or doctor. Original instructions:  Take everyday except Mondays and Fridays. On Mondays and Fridays, take 7.5mg  (separate rx). Updated 10/28/17.       ROS:    Constitutional: No fevers or chills, no weight loss HEENT: No headaches, hearing loss, or runny nose, no sore throat Heart: No chest pain Lungs: +SOB, no cough Abd: No bowel changes, no pain, no N/V GU: No urinary complaints Neuro: No numbness, tingling or weakness Msk: No joint or muscle pain  Objective BP 120/70 (BP Location: Left Arm, Patient Position: Sitting, Cuff Size: Normal)   Pulse 62   Temp (!) 97.5 F (36.4 C) (Oral)   Ht 5\' 8"  (1.727 m)   Wt 190 lb 2 oz (86.2 kg)   SpO2 96%   BMI 28.91 kg/m  General Appearance:  awake, alert, oriented, in no acute distress and well developed, well nourished Skin:  there are no suspicious lesions or rashes of concern Head/face:  NCAT Eyes:  EOMI, PERRLA Ears:  canals and TMs NI Nose/Sinuses:  negative Mouth/Throat:  Mucosa moist, no lesions; pharynx without erythema, edema or exudate. Neck:  neck- supple, no mass, non-tender and no jvd Lungs: Clear to auscultation.  No rales, rhonchi, or wheezing. Normal effort, no accessory muscle use. Heart:  .Regular rate and rhythm with systolic ej murmur, gallop or rub. No bruits. Abdomen:  BS+, soft, NT, ND, no masses or organomegaly Musculoskeletal:  No muscle group atrophy or asymmetry, gait slow, + kyphosis Neurologic:  Alert and oriented x 3, gait normal., reflexes normal and symmetric, strength and  sensation grossly normal Psych exam: Nml mood and affect, age appropriate judgment and insight  Congestive heart failure, unspecified HF chronicity, unspecified heart failure type ([place]) - Plan: Basic metabolic panel, CBC  PAF (paroxysmal atrial fibrillation) ([place]) - Plan: warfarin (COUMADIN) 6 MG tablet  Discharge summary and medication list have been reviewed/reconciled.  Labs pending at the time of discharge have been reviewed or are still pending at the time of this visit.  Follow-up labs and appointments have been ordered and/or coordinated appropriately. Very important to f/u w  cards.  TRANSITIONAL CARE MANAGEMENT CERTIFICATION:  I certify the following are true:   1. Communication with the patient/care giver was made within 2 business days of discharge.  2. Complexity of Medical decision making is mod.  3. Face to face visit occurred within 14 days of discharge.   F/u in 6 mo for med ck or prn. The patient voiced understanding and agreement to the plan.  [place], DO 02/02/18 11:58 AM

## 2018-02-11 ENCOUNTER — Other Ambulatory Visit: Payer: Self-pay | Admitting: Family Medicine

## 2018-02-11 DIAGNOSIS — E785 Hyperlipidemia, unspecified: Secondary | ICD-10-CM

## 2018-02-12 ENCOUNTER — Encounter: Payer: Self-pay | Admitting: Physician Assistant

## 2018-02-12 ENCOUNTER — Ambulatory Visit (INDEPENDENT_AMBULATORY_CARE_PROVIDER_SITE_OTHER): Payer: Medicare Other | Admitting: Physician Assistant

## 2018-02-12 VITALS — BP 110/70 | HR 65 | Ht 68.0 in | Wt 200.0 lb

## 2018-02-12 DIAGNOSIS — I5042 Chronic combined systolic (congestive) and diastolic (congestive) heart failure: Secondary | ICD-10-CM | POA: Diagnosis not present

## 2018-02-12 DIAGNOSIS — N183 Chronic kidney disease, stage 3 unspecified: Secondary | ICD-10-CM

## 2018-02-12 DIAGNOSIS — R5381 Other malaise: Secondary | ICD-10-CM

## 2018-02-12 DIAGNOSIS — I4819 Other persistent atrial fibrillation: Secondary | ICD-10-CM

## 2018-02-12 DIAGNOSIS — I2510 Atherosclerotic heart disease of native coronary artery without angina pectoris: Secondary | ICD-10-CM

## 2018-02-12 DIAGNOSIS — Z23 Encounter for immunization: Secondary | ICD-10-CM | POA: Diagnosis not present

## 2018-02-12 LAB — BASIC METABOLIC PANEL
BUN/Creatinine Ratio: 22 (ref 10–24)
BUN: 44 mg/dL — ABNORMAL HIGH (ref 8–27)
CO2: 25 mmol/L (ref 20–29)
Calcium: 10.2 mg/dL (ref 8.6–10.2)
Chloride: 99 mmol/L (ref 96–106)
Creatinine, Ser: 1.96 mg/dL — ABNORMAL HIGH (ref 0.76–1.27)
GFR calc Af Amer: 34 mL/min/{1.73_m2} — ABNORMAL LOW (ref >59)
GFR calc non Af Amer: 29 mL/min/{1.73_m2} — ABNORMAL LOW (ref >59)
Glucose: 69 mg/dL (ref 65–99)
Potassium: 4 mmol/L (ref 3.5–5.2)
Sodium: 138 mmol/L (ref 134–144)

## 2018-02-12 NOTE — Progress Notes (Signed)
Cardiology Office Note   Date:  02/12/2018   ID:  Kenneth Bell, DOB March 26, 1926, MRN [id number]  PCP:  Kenneth Pal, DO Cardiologist:  Kenneth Hew, MD 10/22/2017 Electrophysiologist: Kenneth Klein, MD  Kenneth Ferries, PA-C     History of Present Illness: Kenneth Bell is a 85 y.o. male with a history of S-CHF, CABG 2013 w/ LIMA-LAD, SVG-OM2, SVG-PDA, SVG-RI (occluded at cath 2017) (3/4 grafts patent at cath 2017), HTN, HLD, CKD III, mod-severe AS, PAH, CHB w/ BSCi CRT-D (100% BiV pacing), hypothyroid, persistent atrial fib, COPD, skin CA s/p 10 hr surgery on his nose (still healing)  Admitted 12/13-12/17/2019 for HF exacerbation, weight at discharge 83.7 kg, creatinine 2.41 Lopressor changed to Toprol-XL  Kenneth Bell presents for cardiology follow up.   At home, his weight has been stable at 195.  He has had a BMET twice since discharge, Cr 2.41>>2.52>>2.00.  However, BUN 44>>49>>54  He drinks Equate shakes 1-2 x day. 8 oz and 200 mg Na  He feels he is putting fluid back on even though his weight is stable. He had PND the other night. DOE is still present and significant.   He lives in [facility]. He says it is a low sodium diet. The dishes are labeled as low sodium.   He does not over-drink, 2 glasses of tea, 1 cup coffee am, 1 glass water. Equate shakes 1-2/day  Still w/ DOE, he got SOB going from the waiting room>>exam room, about 100 feet. Denies waking w/ LE edema. He can wear shoes he was not able to get his feet in before hospital stay.    Past Medical History:  Diagnosis Date  . Brain aneurysm   . Chronic combined systolic and diastolic heart failure, NYHA class 3 (HCC)    EF has seemed to vary report-to-report. Myoview 2007 showed EF 30%, echo in 2016 7 EF 60-65% -> Echo in 08/2015 & 08/2016  40-45%  (with basal-mid inferoseptal & apical inferolateral wall), Mod P HTN (PAP ~53 mHg) --most recent in January 2019 says EF 45-50%.  .  CKD (chronic kidney disease), stage III ([place]) 08/26/2016  . COPD (chronic obstructive pulmonary disease) ([place])   . Coronary artery disease involving native heart with angina pectoris ([place])    a. MI 2002 - PCI to [place]. b. 2005: PCI to LAD, OM1 & OM2.  c. s/p CABG in 2013: LIMA to LAD, SVG to OM2, SVG to PDA and SVG to RI. ; d. Cath 2017: occluded LAD, OM1 & OM2, SVG-RI with patent LIMA-LAD, SVG-rPDA & SVG-OM2.   . Essential hypertension   . Hyperlipidemia with target LDL less than 70   . Moderate pulmonary arterial systolic hypertension (HCC) 08/28/2016   PA pressures F has been estimated at 53-59 mmHg on echo-   . Moderate to severe aortic stenosis 08/28/2016   Stenosis with mild regurgitation.  . Persistent atrial fibrillation   . Presence of biventricular implantable cardioverter-defibrillator (ICD) 2008  . Thyroid disease     Past Surgical History:  Procedure Laterality Date  . APPENDECTOMY    . CARDIAC CATHETERIZATION  06/2015   Left main patent. Diffuse LAD disease with competitive flow and distal LAD from LIMA. Mid Cx 35% with occlusion of OM 1 and 90% dCx. OM 2 occluded (perfuse via SVG). Ostial RCA 70%, dRCA 100%.  Patent LIMA-LAD. Widely patent SVG-OM, SVG-PDA: 40% proximal. 100% proximal SVG-RI  . CARDIAC SURGERY    . CARPAL TUNNEL RELEASE Right 2017  .  COLONOSCOPY  09/05/2012   Colon Polyps  . CORONARY ANGIOPLASTY WITH STENT PLACEMENT     Prior PCI to OM 2; 2005 PCI to LAD and OM1 with a Taxus DES 2.5 x 16 mm (postdilated to 3.8 mm) and PTCA of OM 2 stent  . CORONARY ARTERY BYPASS GRAFT  2013   LIMA-LAD, SVG-L1-2, SVG-PDA, SVG-RI (known occlusion of SVG RI)  . KNEE SURGERY Bilateral 1999  . NM MYOVIEW LTD  04/2015   Prior lateral infarct with no scanning. EF 31% with moderate global HK  . PROSTATE SURGERY    . SHOULDER SURGERY Bilateral    Twice  . TRANSTHORACIC ECHOCARDIOGRAM  08/2015   Moderate LV dysfunction with global HK (estimated at 40 and 45%). Worse anterolateral  and inferolateral wall.  . TRANSTHORACIC ECHOCARDIOGRAM  08/2016   EF 40-45%. Moderate concentric LVH. Akinesis of basal-mid inferoseptal and apical inferolateral wall.  Aortic sclerosis, no stenosis. Moderate mitral thickening with mild MR. Moderate RV dilation. Moderate TR and PR. PA pressures 53 mmHg  . TRANSTHORACIC ECHOCARDIOGRAM  02/2017    EF 45-50% (similar to last).  Apical-inferolateral akinesis.  Severe focal basal and moderate concentric LVH.  Mild aortic stenosis.  Elevated PA pressures with peak pressures estimated 59 mmHg (moderate PA hypertension)  . VEIN BYPASS SURGERY  2013    Current Outpatient Medications  Medication Sig Dispense Refill  . aspirin 81 MG chewable tablet Chew 81 mg by mouth daily.    . bumetanide (BUMEX) 2 MG tablet Take 1 tablet (2 mg total) by mouth daily. 30 tablet 0  . cyanocobalamin 1000 MCG tablet Take 1 tablet (1,000 mcg total) by mouth daily. 30 tablet 0  . levETIRAcetam (KEPPRA) 500 MG tablet TAKE 1 TABLET BY MOUTH TWICE DAILY (Patient taking differently: Take 500 mg by mouth 2 (two) times daily. ) 60 tablet 2  . levothyroxine (SYNTHROID, LEVOTHROID) 50 MCG tablet TAKE 1 TABLET BY MOUTH ONCE DAILY (Patient taking differently: Take 50 mcg by mouth daily before breakfast. ) 90 tablet 1  . metolazone (ZAROXOLYN) 2.5 MG tablet Take 1 tablet (2.5 mg total) by mouth every 7 (seven) days. 30 tablet 0  . metoprolol tartrate (LOPRESSOR) 25 MG tablet Take 25 mg by mouth daily.    Marland Kitchen omeprazole (PRILOSEC) 20 MG capsule TAKE 1 CAPSULE BY MOUTH ONCE DAILY (Patient taking differently: Take 20 mg by mouth daily. ) 90 capsule 2  . potassium chloride (K-DUR) 10 MEQ tablet Take 10 mEq by mouth daily.    . simvastatin (ZOCOR) 20 MG tablet TAKE 1 TABLET BY MOUTH ONCE DAILY IN THE EVENING 90 tablet 0  . warfarin (COUMADIN) 6 MG tablet Take everyday except Mondays and Fridays. On Mondays and Fridays, take 7.5mg  (separate rx). Updated 10/28/17. 90 tablet 1  . warfarin  (COUMADIN) 7.5 MG tablet Take 1 tab on Mondays and Fridays. 90 tablet 1   No current facility-administered medications for this visit.     Allergies:   Lipitor  [atorvastatin] and Nitroglycerin    Social History:  The patient  reports that he quit smoking about 50 years ago. His smoking use included cigarettes. He has never used smokeless tobacco. He reports current alcohol use of about 6.0 standard drinks of alcohol per week. He reports that he does not use drugs.   Family History:  The patient's family history includes Arthritis in his mother; Heart disease in his father.  He indicated that his mother is deceased. He indicated that his father is deceased. He  indicated that his sister is deceased. He indicated that his brother is deceased.    ROS:  Please see the history of present illness. All other systems are reviewed and negative.    PHYSICAL EXAM: VS:  BP 110/70   Pulse 65   Ht 5\' 8"  (1.727 m)   Wt 200 lb (90.7 kg)   BMI 30.41 kg/m  , BMI Body mass index is 30.41 kg/m. GEN: Well nourished, well developed, male in no acute distress HEENT: normal for age  Neck: JVD 9 cm, no carotid bruit, no masses Cardiac: RRR; 2/6 AS murmur, no rubs, or gallops Respiratory:  clear to auscultation bilaterally, normal work of breathing GI: soft, nontender, nondistended, + BS MS: no deformity or atrophy; trace edema; distal pulses are 2+ in all 4 extremities  Skin: warm and dry, no rash Neuro:  Strength and sensation are intact Psych: euthymic mood, full affect   EKG:  EKG is ordered today. The ekg ordered today demonstrates atrial fib, biventricular pacing with occasional PVCs, multifocal  ECHO:  01/24/18 Left ventricle: The cavity size was normal. Wall thickness was increased in a pattern of moderate LVH. Systolic function was moderately reduced. The estimated ejection fraction was in the range of 35% to 40%. Diffuse hypokinesis. There is akinesis of the basalinferior  myocardium. There is hypokinesis of the inferolateral myocardium. Indeterminate diastolic function. - Aortic valve: Trileaflet; moderately calcified leaflets. There was probable moderate to severe low gradient stenosis. There was trivial regurgitation. Mean gradient (S): 14 mm Hg. Peak gradient (S): 22 mm Hg. Valve area (VTI): 1.1 cm^2. Valve area (Vmax): 1.21 cm^2. Mean gradient (S): 14 mm Hg. Peak gradient (S): 22 mm Hg. - Aortic root: The aortic root was mildly ectatic. - Ascending aorta: The ascending aorta was mildly dilated. - Mitral valve: Mildly calcified annulus. There was mild regurgitation. - Left atrium: The atrium was moderately dilated. - Right ventricle: Pacer wire or catheter noted in right ventricle. Systolic function was mildly reduced. - Right atrium: The atrium was mildly dilated. - Atrial septum: No defect or patent foramen ovale was identified. - Tricuspid valve: There was mild regurgitation. - Pulmonary arteries: PA peak pressure: 49 mm Hg (S). - Pericardium, extracardiac: There was no pericardial effusion.  CATH: 06/2015 patent left main diffusely diseased LAD with competitive flow in the distal LAD  35% mid circumflex disease with occlusion of the first obtuse marginal branch and 90% distal circumflex disease. The second obtuse marginal branch was occluded and this is perfused via a saphenous vein graft.  The right coronary artery had a 70% ostial disease segment and 100% occlusion of the distal RCA.   Vein grafts indicated widely patent LIMA to LAD, widely patent SVG to obtuse marginal, 40% proximal vessel disease and distal vessel disease of the SVG to PDA, and 100% occlusion of the proximal SVG to ramus intermedius   Recent Labs: 02/21/2017: TSH 2.601 12/23/2017: ALT 14 01/23/2018: B Natriuretic Peptide 869.8 01/27/2018: Magnesium 1.9 02/02/2018: BUN 64; Creatinine, Ser 2.00; Hemoglobin 12.8; Platelets 255.0; Potassium 3.8; Sodium 139  CBC     Component Value Date/Time   WBC 4.0 02/02/2018 1152   RBC 4.03 (L) 02/02/2018 1152   HGB 12.8 (L) 02/02/2018 1152   HCT 38.6 (L) 02/02/2018 1152   HCT 36.9 (L) 02/21/2017 1121   PLT 255.0 02/02/2018 1152   MCV 95.8 02/02/2018 1152   MCH 30.6 01/23/2018 0458   MCHC 33.2 02/02/2018 1152   RDW 14.5 02/02/2018 1152   LYMPHSABS  1.0 01/23/2018 0458   MONOABS 0.5 01/23/2018 0458   EOSABS 0.2 01/23/2018 0458   BASOSABS 0.1 01/23/2018 0458   CMP Latest Ref Rng & Units 02/02/2018 01/27/2018 01/26/2018  Glucose 70 - 99 mg/dL 144(H) 105(H) 117(H)  BUN 6 - 23 mg/dL 64(H) 49(H) 44(H)  Creatinine 0.40 - 1.50 mg/dL 2.00(H) 2.52(H) 2.41(H)  Sodium 135 - 145 mEq/L 139 135 136  Potassium 3.5 - 5.1 mEq/L 3.8 3.7 3.6  Chloride 96 - 112 mEq/L 96 90(L) 92(L)  CO2 19 - 32 mEq/L 35(H) 29 27  Calcium 8.4 - 10.5 mg/dL 10.3 9.9 10.4(H)  Total Protein 6.5 - 8.1 g/dL - - -  Total Bilirubin 0.3 - 1.2 mg/dL - - -  Alkaline Phos 38 - 126 U/L - - -  AST 15 - 41 U/L - - -  ALT 0 - 44 U/L - - -     Lipid Panel Lab Results  Component Value Date   CHOL 123 02/21/2017   HDL 31 (L) 02/21/2017   LDLCALC 63 02/21/2017   TRIG 143 02/21/2017   CHOLHDL 4.0 02/21/2017      Wt Readings from Last 3 Encounters:  02/12/18 200 lb (90.7 kg)  02/02/18 190 lb 2 oz (86.2 kg)  01/27/18 184 lb 8 oz (83.7 kg)     Other studies Reviewed: Additional studies/ records that were reviewed today include: Office notes, hospital records and testing.  ASSESSMENT AND PLAN:  1.  Chronic systolic CHF: He says his weight has been stable at 195 pounds at home.  However, he also has had PND in the last few days and his weight is up 10 pounds in 10 days according to our scales - He is encouraged to continue to make low-sodium selections at meals. -He is to increase the metolazone to 5 mg daily for the next 2 days. -Goal weight is 193 pounds or less for now. - The Equate meal supplement is similar to Ensure, it has multiple  vitamins and minerals in it as well as protein, but also has 200 mg of sodium.  If we cannot keep the fluid off, he may have to drink only a half a 1 a day. -He ambulated in the office and got very short of breath and less than 200 feet.  However, his oxygen saturation stayed 94% or above the whole time.   2.  Chronic kidney disease stage III-4 - He says he is making low-sodium food choices - I do not believe he is over drinking, he may not be drinking enough.  His BUN has been increasing steadily. - He is requested to make sure he drinks 1-1.5 L of all liquids daily. -Check BMET today and in 1 week.  3.  Longstanding persistent atrial fibrillation: At his last check, he was biventricular pacing 92% of the time, in A. fib 100% of the time - No evidence that his device is not functioning well, keep scheduled checks -QRS duration is longer than previous, will route this to Dr. Sallyanne Kuster to see if his device should be optimized.  4.  CAD: He is having no ischemic symptoms.  No med changes -Continue current therapy,  5.  Deconditioning -He is encouraged to work with a physical trainer or physical therapist to try to get stronger.   Current medicines are reviewed at length with the patient today.  The patient does not have concerns regarding medicines.  The following changes have been made: Increase metolazone temporarily  Labs/ tests ordered today  include:   Orders Placed This Encounter  Procedures  . Basic metabolic panel  . Basic metabolic panel  . EKG 12-Lead     Disposition:   FU with Kenneth Hew, MD  Signed, Kenneth Ferries, PA-C  02/12/2018 1:01 PM    [facility] Phone: [phone]; Fax: [phone]

## 2018-02-12 NOTE — Patient Instructions (Addendum)
Medication Instructions:  Increase Metolazone to 5 mg for TWO DAYS ONLY. (take two tablets of the 2.5mg ) Then back to normal dose. If you need a refill on your cardiac medications before your next appointment, please call your pharmacy.   Lab work: BMET today. BMET in one week. If you have labs (blood work) drawn today and your tests are completely normal, you will receive your results only by: Marland Kitchen MyChart Message (if you have MyChart) OR . A paper copy in the mail If you have any lab test that is abnormal or we need to change your treatment, we will call you to review the results.  Follow-Up: At [facility], you and your health needs are our priority.  As part of our continuing mission to provide you with exceptional heart care, we have created designated Provider Care Teams.  These Care Teams include your primary Cardiologist (physician) and Advanced Practice Providers (APPs -  Physician Assistants and Nurse Practitioners) who all work together to provide you with the care you need, when you need it. . Follow up with Dr.Harding first available.  Any Other Special Instructions Will Be Listed Below (If Applicable). Follow with Physical Therapy at [facility]. Let us know if you would like [place] to send a referral for Physical Therapy through [place].  Continue daily weights. GOAL weight is 193 lbs. DO NOT DRINK more than 1-1 1/2 quarts of Liquids per day. Continue a low sodium diet.

## 2018-02-17 ENCOUNTER — Ambulatory Visit (INDEPENDENT_AMBULATORY_CARE_PROVIDER_SITE_OTHER): Payer: Medicare Other

## 2018-02-17 DIAGNOSIS — I255 Ischemic cardiomyopathy: Secondary | ICD-10-CM | POA: Diagnosis not present

## 2018-02-17 DIAGNOSIS — I5022 Chronic systolic (congestive) heart failure: Secondary | ICD-10-CM

## 2018-02-18 NOTE — Progress Notes (Signed)
Remote ICD transmission.   

## 2018-02-19 ENCOUNTER — Other Ambulatory Visit: Payer: Self-pay | Admitting: Family Medicine

## 2018-02-19 DIAGNOSIS — E039 Hypothyroidism, unspecified: Secondary | ICD-10-CM

## 2018-02-19 LAB — CUP PACEART REMOTE DEVICE CHECK
Date Time Interrogation Session: 20200109064453
Implantable Lead Implant Date: 20080918
Implantable Lead Implant Date: 20080918
Implantable Lead Implant Date: 20150918
Implantable Lead Implant Date: 20150918
Implantable Lead Location: 753858
Implantable Lead Location: 753859
Implantable Lead Location: 753860
Implantable Lead Model: 181
Implantable Lead Model: 5076
Implantable Lead Model: 5076
Implantable Lead Serial Number: 330602
Implantable Pulse Generator Implant Date: 20150918
MDC IDC LEAD LOCATION: 753860
Pulse Gen Serial Number: 103634

## 2018-02-20 ENCOUNTER — Other Ambulatory Visit: Payer: Medicare Other

## 2018-02-20 DIAGNOSIS — I2510 Atherosclerotic heart disease of native coronary artery without angina pectoris: Secondary | ICD-10-CM | POA: Diagnosis not present

## 2018-02-20 DIAGNOSIS — N183 Chronic kidney disease, stage 3 (moderate): Secondary | ICD-10-CM | POA: Diagnosis not present

## 2018-02-21 LAB — BASIC METABOLIC PANEL
BUN/Creatinine Ratio: 23 (ref 10–24)
BUN: 48 mg/dL — ABNORMAL HIGH (ref 8–27)
CO2: 26 mmol/L (ref 20–29)
Calcium: 10.3 mg/dL — ABNORMAL HIGH (ref 8.6–10.2)
Chloride: 100 mmol/L (ref 96–106)
Creatinine, Ser: 2.09 mg/dL — ABNORMAL HIGH (ref 0.76–1.27)
GFR calc Af Amer: 31 mL/min/{1.73_m2} — ABNORMAL LOW (ref >59)
GFR, EST NON AFRICAN AMERICAN: 27 mL/min/{1.73_m2} — AB (ref >59)
Glucose: 85 mg/dL (ref 65–99)
Potassium: 4.4 mmol/L (ref 3.5–5.2)
Sodium: 141 mmol/L (ref 134–144)

## 2018-02-23 ENCOUNTER — Telehealth: Payer: Self-pay | Admitting: *Deleted

## 2018-02-23 NOTE — Telephone Encounter (Signed)
Then please increase the metoprolol to 25 mg twice daily MCr

## 2018-02-23 NOTE — Telephone Encounter (Signed)
Spoke with patient to discuss recommendations. Patient looked at his metoprolol bottle and confirmed that he is taking metoprolol tartrate 25mg  once daily, not metoprolol succinate 25mg  once daily.  Advised patient I will reach out to Dr. Sallyanne Kuster for clarification of orders. Patient is agreeable to this plan and appreciative of call.

## 2018-02-23 NOTE — Telephone Encounter (Signed)
-----   Message from Sanda Klein, MD sent at 02/21/2018 12:30 PM EST ----- Remote reviewed.   Not pacemaker dependent.  Biventricular pacing efficiency has deteriorated from 97% to 89% over the last 3 months, due to increased frequency of ventricular sensed beats. Battery status is good. Lead measurements are stable. Heart rate histogram is favorable. Very rare, very brief nonsustained VT.  Please ask him to increase his metoprolol succinate to 37.5 mg daily (that is 1.5 tablets daily).

## 2018-02-24 NOTE — Telephone Encounter (Signed)
Spoke with [place] at patient's pharmacy-- recent refill of metoprolol succinate 25mg  (sent in 02/19/18 per records in [place]) was filled and picked up. Caryl Comes looked as far back as 12/2016 and they have been filling metoprolol succinate. They do not have metoprolol tartrate noted in patient's recent med history.  Called patient, [facility] requesting call back to the [facility].

## 2018-02-25 NOTE — Telephone Encounter (Signed)
Spoke with patient. He manages his own medicine and is not near his medicine bottles at this time. He requests that I "just send the instructions in writing" regarding his metoprolol dose. Explained importance of clarifying which type of metoprolol he is currently taking before we increase the dose.  Patient states he has an appointment on Friday, 02/27/18 at 0800 with an orthopedic MD at [facility]. He asked if [facility] while he's there and review them with a nurse. Advised I will reach out to [facility] staff for further assistance. He is agreeable to this plan.  Routed to [facility] triage pool.

## 2018-02-26 NOTE — Telephone Encounter (Signed)
Yes he is on metoprolol succinate 25 mg daily.[place], MD

## 2018-02-26 NOTE — Telephone Encounter (Signed)
Spoke with pt who states he's confused about his medication. He report for years he has taking metoprolol succinate 25 mg daily and the bottle he has on hand says metoprolol tartrate, but doesn't know when the change was made. Per chart review pt was taking succinate 25 mg daily at [facility] on 10/20/17 but discharge instructions on 01/27/18 from hospital instructs pt to take Metoprolol tartrate 25 mg daily. Pt has since seen Rosaria Ferries, PA on 02/12/18. No medication changes were made, and AVS states Metoprolol Tartrate. A new prescription was sent for Succinate on 02/19/18 but as of today both medication are list on his chart. Message will be route to Dr. Ellyn Hack to clarify which medication pt should be taking.

## 2018-02-27 ENCOUNTER — Telehealth: Payer: Self-pay

## 2018-02-27 DIAGNOSIS — M545 Low back pain: Secondary | ICD-10-CM | POA: Diagnosis not present

## 2018-02-27 MED ORDER — METOPROLOL SUCCINATE ER 25 MG PO TB24: 25.0000 mg | ORAL_TABLET | Freq: Every day | ORAL | 1 refills | Status: DC

## 2018-02-27 NOTE — Telephone Encounter (Signed)
LVM for patient to call back for lab results

## 2018-02-27 NOTE — Telephone Encounter (Signed)
Pt updated with Dr. Allison Quarry recommendation. Pt verbalized understanding.  Nurse will print off a new medication list for pt to pick up at the front desk.

## 2018-02-27 NOTE — Progress Notes (Signed)
Left voice message for the patient to callback for lab results.

## 2018-03-01 ENCOUNTER — Emergency Department (HOSPITAL_BASED_OUTPATIENT_CLINIC_OR_DEPARTMENT_OTHER): Payer: Medicare Other

## 2018-03-01 ENCOUNTER — Other Ambulatory Visit: Payer: Self-pay

## 2018-03-01 ENCOUNTER — Encounter (HOSPITAL_BASED_OUTPATIENT_CLINIC_OR_DEPARTMENT_OTHER): Payer: Self-pay | Admitting: *Deleted

## 2018-03-01 ENCOUNTER — Observation Stay (HOSPITAL_BASED_OUTPATIENT_CLINIC_OR_DEPARTMENT_OTHER)
Admission: EM | Admit: 2018-03-01 | Discharge: 2018-03-02 | Payer: Medicare Other | Attending: Internal Medicine | Admitting: Internal Medicine

## 2018-03-01 DIAGNOSIS — R079 Chest pain, unspecified: Secondary | ICD-10-CM | POA: Diagnosis not present

## 2018-03-01 DIAGNOSIS — Z66 Do not resuscitate: Secondary | ICD-10-CM | POA: Diagnosis not present

## 2018-03-01 DIAGNOSIS — I130 Hypertensive heart and chronic kidney disease with heart failure and stage 1 through stage 4 chronic kidney disease, or unspecified chronic kidney disease: Secondary | ICD-10-CM | POA: Diagnosis not present

## 2018-03-01 DIAGNOSIS — I350 Nonrheumatic aortic (valve) stenosis: Secondary | ICD-10-CM | POA: Insufficient documentation

## 2018-03-01 DIAGNOSIS — Z951 Presence of aortocoronary bypass graft: Secondary | ICD-10-CM | POA: Insufficient documentation

## 2018-03-01 DIAGNOSIS — Z7901 Long term (current) use of anticoagulants: Secondary | ICD-10-CM | POA: Diagnosis not present

## 2018-03-01 DIAGNOSIS — I25709 Atherosclerosis of coronary artery bypass graft(s), unspecified, with unspecified angina pectoris: Secondary | ICD-10-CM | POA: Diagnosis not present

## 2018-03-01 DIAGNOSIS — E079 Disorder of thyroid, unspecified: Secondary | ICD-10-CM | POA: Diagnosis present

## 2018-03-01 DIAGNOSIS — Z87891 Personal history of nicotine dependence: Secondary | ICD-10-CM | POA: Diagnosis not present

## 2018-03-01 DIAGNOSIS — Z888 Allergy status to other drugs, medicaments and biological substances status: Secondary | ICD-10-CM | POA: Insufficient documentation

## 2018-03-01 DIAGNOSIS — I493 Ventricular premature depolarization: Secondary | ICD-10-CM | POA: Diagnosis not present

## 2018-03-01 DIAGNOSIS — Z955 Presence of coronary angioplasty implant and graft: Secondary | ICD-10-CM | POA: Diagnosis not present

## 2018-03-01 DIAGNOSIS — E785 Hyperlipidemia, unspecified: Secondary | ICD-10-CM | POA: Diagnosis not present

## 2018-03-01 DIAGNOSIS — N183 Chronic kidney disease, stage 3 unspecified: Secondary | ICD-10-CM | POA: Diagnosis present

## 2018-03-01 DIAGNOSIS — R0602 Shortness of breath: Secondary | ICD-10-CM | POA: Diagnosis not present

## 2018-03-01 DIAGNOSIS — I10 Essential (primary) hypertension: Secondary | ICD-10-CM | POA: Diagnosis present

## 2018-03-01 DIAGNOSIS — I5042 Chronic combined systolic (congestive) and diastolic (congestive) heart failure: Secondary | ICD-10-CM | POA: Diagnosis present

## 2018-03-01 DIAGNOSIS — Z79890 Hormone replacement therapy (postmenopausal): Secondary | ICD-10-CM | POA: Insufficient documentation

## 2018-03-01 DIAGNOSIS — Z8249 Family history of ischemic heart disease and other diseases of the circulatory system: Secondary | ICD-10-CM | POA: Diagnosis not present

## 2018-03-01 DIAGNOSIS — Z79899 Other long term (current) drug therapy: Secondary | ICD-10-CM | POA: Insufficient documentation

## 2018-03-01 DIAGNOSIS — R569 Unspecified convulsions: Secondary | ICD-10-CM | POA: Diagnosis not present

## 2018-03-01 DIAGNOSIS — J449 Chronic obstructive pulmonary disease, unspecified: Secondary | ICD-10-CM | POA: Diagnosis present

## 2018-03-01 DIAGNOSIS — I4819 Other persistent atrial fibrillation: Secondary | ICD-10-CM | POA: Diagnosis present

## 2018-03-01 DIAGNOSIS — I509 Heart failure, unspecified: Secondary | ICD-10-CM

## 2018-03-01 DIAGNOSIS — R0789 Other chest pain: Secondary | ICD-10-CM | POA: Diagnosis not present

## 2018-03-01 DIAGNOSIS — Z95810 Presence of automatic (implantable) cardiac defibrillator: Secondary | ICD-10-CM | POA: Diagnosis not present

## 2018-03-01 DIAGNOSIS — I252 Old myocardial infarction: Secondary | ICD-10-CM | POA: Insufficient documentation

## 2018-03-01 DIAGNOSIS — Z7982 Long term (current) use of aspirin: Secondary | ICD-10-CM | POA: Diagnosis not present

## 2018-03-01 LAB — BASIC METABOLIC PANEL
Anion gap: 10 (ref 5–15)
BUN: 54 mg/dL — ABNORMAL HIGH (ref 8–23)
CO2: 24 mmol/L (ref 22–32)
CREATININE: 2.13 mg/dL — AB (ref 0.61–1.24)
Calcium: 9.7 mg/dL (ref 8.9–10.3)
Chloride: 100 mmol/L (ref 98–111)
GFR calc Af Amer: 31 mL/min — ABNORMAL LOW (ref >60)
GFR calc non Af Amer: 27 mL/min — ABNORMAL LOW (ref >60)
GLUCOSE: 153 mg/dL — AB (ref 70–99)
Potassium: 3.8 mmol/L (ref 3.5–5.1)
Sodium: 134 mmol/L — ABNORMAL LOW (ref 135–145)

## 2018-03-01 LAB — PROTIME-INR
INR: 4.02
Prothrombin Time: 38.5 seconds — ABNORMAL HIGH (ref 11.4–15.2)

## 2018-03-01 LAB — CBC
HCT: 36.1 % — ABNORMAL LOW (ref 39.0–52.0)
Hemoglobin: 11.1 g/dL — ABNORMAL LOW (ref 13.0–17.0)
MCH: 30.4 pg (ref 26.0–34.0)
MCHC: 30.7 g/dL (ref 30.0–36.0)
MCV: 98.9 fL (ref 80.0–100.0)
Platelets: 213 10*3/uL (ref 150–400)
RBC: 3.65 MIL/uL — ABNORMAL LOW (ref 4.22–5.81)
RDW: 14.9 % (ref 11.5–15.5)
WBC: 4.1 10*3/uL (ref 4.0–10.5)
nRBC: 0 % (ref 0.0–0.2)

## 2018-03-01 LAB — TROPONIN I: Troponin I: 0.12 ng/mL (ref <0.03)

## 2018-03-01 LAB — BRAIN NATRIURETIC PEPTIDE: B Natriuretic Peptide: 649.8 pg/mL — ABNORMAL HIGH (ref 0.0–100.0)

## 2018-03-01 MED ORDER — SODIUM CHLORIDE 0.9% FLUSH
3.0000 mL | Freq: Once | INTRAVENOUS | Status: DC
  Filled 2018-03-01: qty 3

## 2018-03-01 NOTE — ED Notes (Signed)
Lab called with a troponin of 0.12. Dr. Tamera Punt aware.

## 2018-03-01 NOTE — ED Provider Notes (Signed)
[facility] Provider Note   CSN: [id number] Arrival date & time: 03/01/18  2031     History   Chief Complaint Chief Complaint  Patient presents with  . Chest Pain    HPI Ezeriah Luty is a 85 y.o. male.  Patient is a 84 year old male who presents with chest pain.  He has a history of CHF, CABG in 2013, hypertension, hyperlipidemia, chronic kidney disease, aortic stenosis and COPD.  He states he has baseline shortness of breath.  He does not feel his breathing is any worse than normal.  He has been having some sharp pains in his arms and legs which seem to be worse with movements.  However today he had a different type pain in his left chest which he describes as a heart type pain.  He states it was not worse with any kind of movement.  It was not brought on by exertion.  He describes it as a pain over his heart that lasted about 20 or 30 minutes.  He had two subsequent episodes following that.  He currently does not have any chest discomfort.  No cough or chest congestion.  The pain was not worse with movement or breathing.  He denies any increase in his leg swelling.  He denies any history of similar symptoms in the past.     Past Medical History:  Diagnosis Date  . Brain aneurysm   . Chronic combined systolic and diastolic heart failure, NYHA class 3 (HCC)    EF has seemed to vary report-to-report. Myoview 2007 showed EF 30%, echo in 2016 7 EF 60-65% -> Echo in 08/2015 & 08/2016  40-45%  (with basal-mid inferoseptal & apical inferolateral wall), Mod P HTN (PAP ~53 mHg) --most recent in January 2019 says EF 45-50%.  . CKD (chronic kidney disease), stage III ([place]) 08/26/2016  . COPD (chronic obstructive pulmonary disease) ([place])   . Coronary artery disease involving native heart with angina pectoris ([place])    a. MI 2002 - PCI to [employer]. b. 2005: PCI to LAD, OM1 & OM2.  c. s/p CABG in 2013: LIMA to LAD, SVG to OM2, SVG to PDA and SVG to RI. ; d. Cath 2017:  occluded LAD, OM1 & OM2, SVG-RI with patent LIMA-LAD, SVG-rPDA & SVG-OM2.   . Essential hypertension   . Hyperlipidemia with target LDL less than 70   . Moderate pulmonary arterial systolic hypertension (HCC) 08/28/2016   PA pressures F has been estimated at 53-59 mmHg on echo-   . Moderate to severe aortic stenosis 08/28/2016   Stenosis with mild regurgitation.  . Persistent atrial fibrillation   . Presence of biventricular implantable cardioverter-defibrillator (ICD) 2008  . Thyroid disease     Patient Active Problem List   Diagnosis Date Noted  . CHF (congestive heart failure) ([place]) 01/25/2018  . Acute respiratory failure with hypoxemia ([place]) 01/24/2018  . Acute on chronic systolic heart failure ([place]) 01/23/2018  . Pain in both forearms 01/14/2018  . Cerebral thrombosis with cerebral infarction 02/21/2017  . Cerebral embolism with cerebral infarction 02/21/2017  . Subarachnoid hemorrhage 02/21/2017  . Confusion 02/20/2017  . Brain aneurysm   . Thyroid disease   . Hyperlipidemia with target LDL less than 70   . Essential hypertension   . Coronary artery disease involving native heart with angina pectoris ([place])   . COPD (chronic obstructive pulmonary disease) ([place])   . Aortic stenosis, mild 09/13/2016  . Ischemic cardiomyopathy 08/28/2016  . Moderate pulmonary arterial  systolic hypertension ([place]) 08/28/2016  . Mild aortic insufficiency 08/28/2016  . Elevated troponin   . Chronic combined systolic and diastolic heart failure, NYHA class 3 ([place]) 08/26/2016  . CKD (chronic kidney disease), stage III ([place]) 08/26/2016  . Persistent atrial fibrillation ([place]); CHA2DS2Vasc = ~4 (CHF, MI/aortic plaque, Age 28) 08/21/2016  . Biventricular ICD (implantable cardioverter-defibrillator) in place 09/13/2012  . Coronary artery disease involving coronary bypass graft of native heart with angina pectoris ([place]) 08/26/2012  . Presence of biventricular implantable cardioverter-defibrillator (ICD)  02/11/2006    Past Surgical History:  Procedure Laterality Date  . APPENDECTOMY    . CARDIAC CATHETERIZATION  06/2015   Left main patent. Diffuse LAD disease with competitive flow and distal LAD from LIMA. Mid Cx 35% with occlusion of OM 1 and 90% dCx. OM 2 occluded (perfuse via SVG). Ostial RCA 70%, dRCA 100%.  Patent LIMA-LAD. Widely patent SVG-OM, SVG-PDA: 40% proximal. 100% proximal SVG-RI  . CARDIAC SURGERY    . CARPAL TUNNEL RELEASE Right 2017  . COLONOSCOPY  09/05/2012   Colon Polyps  . CORONARY ANGIOPLASTY WITH STENT PLACEMENT     Prior PCI to OM 2; 2005 PCI to LAD and OM1 with a Taxus DES 2.5 x 16 mm (postdilated to 3.8 mm) and PTCA of OM 2 stent  . CORONARY ARTERY BYPASS GRAFT  2013   LIMA-LAD, SVG-L1-2, SVG-PDA, SVG-RI (known occlusion of SVG RI)  . KNEE SURGERY Bilateral 1999  . NM MYOVIEW LTD  04/2015   Prior lateral infarct with no scanning. EF 31% with moderate global HK  . PROSTATE SURGERY    . SHOULDER SURGERY Bilateral    Twice  . TRANSTHORACIC ECHOCARDIOGRAM  08/2015   Moderate LV dysfunction with global HK (estimated at 40 and 45%). Worse anterolateral and inferolateral wall.  . TRANSTHORACIC ECHOCARDIOGRAM  08/2016   EF 40-45%. Moderate concentric LVH. Akinesis of basal-mid inferoseptal and apical inferolateral wall.  Aortic sclerosis, no stenosis. Moderate mitral thickening with mild MR. Moderate RV dilation. Moderate TR and PR. PA pressures 53 mmHg  . TRANSTHORACIC ECHOCARDIOGRAM  02/2017    EF 45-50% (similar to last).  Apical-inferolateral akinesis.  Severe focal basal and moderate concentric LVH.  Mild aortic stenosis.  Elevated PA pressures with peak pressures estimated 59 mmHg (moderate PA hypertension)  . VEIN BYPASS SURGERY  2013        Home Medications    Prior to Admission medications   Medication Sig Start Date End Date Taking? Authorizing Provider  aspirin 81 MG chewable tablet Chew 81 mg by mouth daily.    [provider]  bumetanide  (BUMEX) 2 MG tablet Take 1 tablet (2 mg total) by mouth daily. 01/28/18   Kayleen Memos, DO  cyanocobalamin 1000 MCG tablet Take 1 tablet (1,000 mcg total) by mouth daily. 02/24/17   Hongalgi, Lenis Dickinson, MD  levETIRAcetam (KEPPRA) 500 MG tablet TAKE 1 TABLET BY MOUTH TWICE DAILY Patient taking differently: Take 500 mg by mouth 2 (two) times daily.  11/27/17   [name], DO  levothyroxine (SYNTHROID, LEVOTHROID) 50 MCG tablet TAKE 1 TABLET BY MOUTH ONCE DAILY 02/19/18   Wendling, Crosby Oyster, DO  metolazone (ZAROXOLYN) 2.5 MG tablet Take 1 tablet (2.5 mg total) by mouth every 7 (seven) days. 02/02/18   Kayleen Memos, DO  metoprolol succinate (TOPROL-XL) 25 MG 24 hr tablet Take 1 tablet (25 mg total) by mouth daily. 02/27/18   Leonie Man, MD  omeprazole (PRILOSEC) 20 MG capsule TAKE  1 CAPSULE BY MOUTH ONCE DAILY Patient taking differently: Take 20 mg by mouth daily.  08/04/17   [name], DO  potassium chloride (K-DUR) 10 MEQ tablet Take 10 mEq by mouth daily.    [provider]  simvastatin (ZOCOR) 20 MG tablet TAKE 1 TABLET BY MOUTH ONCE DAILY IN THE EVENING 02/12/18   [name], DO  warfarin (COUMADIN) 6 MG tablet Take everyday except Mondays and Fridays. On Mondays and Fridays, take 7.5mg  (separate rx). Updated 10/28/17. 02/02/18   [name], DO  warfarin (COUMADIN) 7.5 MG tablet Take 1 tab on Mondays and Fridays. 01/30/18   [name], DO    Family History Family History  Problem Relation Age of Onset  . Arthritis Mother   . Heart disease Father     Social History Social History   Tobacco Use  . Smoking status: Former Smoker    Types: Cigarettes    Last attempt to quit: 05/30/1967    Years since quitting: 50.7  . Smokeless tobacco: Never Used  Substance Use Topics  . Alcohol use: Yes    Alcohol/week: 6.0 standard drinks    Types: 6 Glasses of wine per week    Comment: weekly  . Drug use: No      Allergies   Lipitor  [atorvastatin] and Nitroglycerin   Review of Systems Review of Systems  Constitutional: Positive for fatigue. Negative for chills, diaphoresis and fever.  HENT: Negative for congestion, rhinorrhea and sneezing.   Eyes: Negative.   Respiratory: Positive for shortness of breath (At baseline). Negative for cough and chest tightness.   Cardiovascular: Positive for chest pain. Negative for leg swelling.  Gastrointestinal: Negative for abdominal pain, blood in stool, diarrhea, nausea and vomiting.  Genitourinary: Negative for difficulty urinating, flank pain, frequency and hematuria.  Musculoskeletal: Negative for arthralgias and back pain.  Skin: Negative for rash.  Neurological: Negative for dizziness, speech difficulty, weakness, numbness and headaches.     Physical Exam Updated Vital Signs BP 116/74   Pulse 60   Temp 97.7 F (36.5 C) (Oral)   Resp 14   Ht 5\' 8"  (1.727 m)   Wt 90.7 kg   SpO2 96%   BMI 30.40 kg/m   Physical Exam Constitutional:      Appearance: He is well-developed.  HENT:     Head: Normocephalic and atraumatic.  Eyes:     Pupils: Pupils are equal, round, and reactive to light.  Neck:     Musculoskeletal: Normal range of motion and neck supple.  Cardiovascular:     Rate and Rhythm: Normal rate and regular rhythm.     Heart sounds: Murmur present.  Pulmonary:     Effort: Pulmonary effort is normal. No respiratory distress.     Breath sounds: Normal breath sounds. No wheezing or rales.     Comments: Few crackles in the bases Chest:     Chest wall: No tenderness.  Abdominal:     General: Bowel sounds are normal.     Palpations: Abdomen is soft.     Tenderness: There is no abdominal tenderness. There is no guarding or rebound.  Musculoskeletal: Normal range of motion.     Comments: 1+ pitting edema bilaterally  Lymphadenopathy:     Cervical: No cervical adenopathy.  Skin:    General: Skin is warm and dry.     Findings:  No rash.  Neurological:     Mental Status: He is alert and oriented to person, place,  and time.      ED Treatments / Results  Labs (all labs ordered are listed, but only abnormal results are displayed) Labs Reviewed  BASIC METABOLIC PANEL - Abnormal; Notable for the following components:      Result Value   Sodium 134 (*)    Glucose, Bld 153 (*)    BUN 54 (*)    Creatinine, Ser 2.13 (*)    GFR calc non Af Amer 27 (*)    GFR calc Af Amer 31 (*)    All other components within normal limits  CBC - Abnormal; Notable for the following components:   RBC 3.65 (*)    Hemoglobin 11.1 (*)    HCT 36.1 (*)    All other components within normal limits  TROPONIN I - Abnormal; Notable for the following components:   Troponin I 0.12 (*)    All other components within normal limits  BRAIN NATRIURETIC PEPTIDE - Abnormal; Notable for the following components:   B Natriuretic Peptide 649.8 (*)    All other components within normal limits  PROTIME-INR    EKG EKG Interpretation  Date/Time:  Sunday March 01 2018 21:58:05 EST Ventricular Rate:  60 PR Interval:    QRS Duration: 134 QT Interval:  486 QTC Calculation: 486 R Axis:   -103 Text Interpretation:  Ventricular-paced complexes No further analysis attempted due to paced rhythm Confirmed by Malvin Johns [phone]) on 03/01/2018 11:05:18 PM   Radiology Dg Chest 2 View  Result Date: 03/01/2018 CLINICAL DATA:  Chest pain EXAM: CHEST - 2 VIEW COMPARISON:  01/23/2018 FINDINGS: Post sternotomy changes. Left-sided pacing device appears stable. No focal consolidation or effusion. Mild cardiomegaly with aortic atherosclerosis. No pneumothorax. Ankylosis of the spine. IMPRESSION: No active cardiopulmonary disease.  Stable cardiomegaly. Electronically Signed   By: Donavan Foil M.D.   On: 03/01/2018 21:46    Procedures Procedures (including critical care time)  Medications Ordered in ED Medications  sodium chloride flush (NS) 0.9 %  injection 3 mL (has no administration in time range)     Initial Impression / Assessment and Plan / ED Course  I have reviewed the triage vital signs and the nursing notes.  Pertinent labs & imaging results that were available during my care of the patient were reviewed by me and considered in my medical decision making (see chart for details).     Patient is a 84 year old male who presents with chest pain.  He has shortness of breath but it seems to be at baseline.  His EKG shows a paced rhythm which appears similar to his prior EKGs.  His troponin is elevated at 0.12 but it appears to be chronically elevated.  However he does have significant risk factors.  He has had prior stents placed.  His last cath per record review appears to be in 2017.  He did have an echocardiogram in December 2018 which showed a depressed EF of 35 to 40% diffuse hypokinesis and regional wall abnormalities in the inferior wall.  He currently is pain-free.  However given his underlying risk factors, I will consult the hospitalist for admission for further cardiac evaluation.  I spoke with Dr. Juliet Rude who will admit the pt. To [employer] [facility].  Final Clinical Impressions(s) / ED Diagnoses   Final diagnoses:  Chest pain, unspecified type    ED Discharge Orders    None       Malvin Johns, MD 03/01/18 2317

## 2018-03-01 NOTE — ED Notes (Signed)
Lab called and states INR 4.02. Dr. Tamera Punt aware.

## 2018-03-01 NOTE — ED Notes (Signed)
[facility] notified (Taryn) - patient ready for transport 

## 2018-03-01 NOTE — ED Notes (Signed)
Labs being drawn 

## 2018-03-01 NOTE — ED Triage Notes (Signed)
Pt reports chest pain/pressure tonight. Hx of Heart attack

## 2018-03-02 DIAGNOSIS — I5042 Chronic combined systolic (congestive) and diastolic (congestive) heart failure: Secondary | ICD-10-CM

## 2018-03-02 DIAGNOSIS — E785 Hyperlipidemia, unspecified: Secondary | ICD-10-CM | POA: Diagnosis not present

## 2018-03-02 DIAGNOSIS — J449 Chronic obstructive pulmonary disease, unspecified: Secondary | ICD-10-CM | POA: Diagnosis not present

## 2018-03-02 DIAGNOSIS — N183 Chronic kidney disease, stage 3 (moderate): Secondary | ICD-10-CM

## 2018-03-02 DIAGNOSIS — R079 Chest pain, unspecified: Secondary | ICD-10-CM | POA: Diagnosis not present

## 2018-03-02 DIAGNOSIS — I350 Nonrheumatic aortic (valve) stenosis: Secondary | ICD-10-CM | POA: Diagnosis not present

## 2018-03-02 DIAGNOSIS — Z66 Do not resuscitate: Secondary | ICD-10-CM | POA: Diagnosis not present

## 2018-03-02 DIAGNOSIS — R569 Unspecified convulsions: Secondary | ICD-10-CM | POA: Diagnosis not present

## 2018-03-02 DIAGNOSIS — I130 Hypertensive heart and chronic kidney disease with heart failure and stage 1 through stage 4 chronic kidney disease, or unspecified chronic kidney disease: Secondary | ICD-10-CM | POA: Diagnosis not present

## 2018-03-02 DIAGNOSIS — I25709 Atherosclerosis of coronary artery bypass graft(s), unspecified, with unspecified angina pectoris: Secondary | ICD-10-CM | POA: Diagnosis not present

## 2018-03-02 DIAGNOSIS — I4819 Other persistent atrial fibrillation: Secondary | ICD-10-CM | POA: Diagnosis not present

## 2018-03-02 DIAGNOSIS — I493 Ventricular premature depolarization: Secondary | ICD-10-CM | POA: Diagnosis not present

## 2018-03-02 DIAGNOSIS — R0789 Other chest pain: Secondary | ICD-10-CM | POA: Diagnosis not present

## 2018-03-02 LAB — PROTIME-INR
INR: 4.24
PROTHROMBIN TIME: 40.1 s — AB (ref 11.4–15.2)

## 2018-03-02 LAB — CK TOTAL AND CKMB (NOT AT ARMC)
CK TOTAL: 74 U/L (ref 49–397)
CK, MB: 2.8 ng/mL (ref 0.5–5.0)
CK, MB: 2.9 ng/mL (ref 0.5–5.0)
Relative Index: INVALID (ref 0.0–2.5)
Relative Index: INVALID (ref 0.0–2.5)
Total CK: 93 U/L (ref 49–397)

## 2018-03-02 LAB — LIPID PANEL
Cholesterol: 113 mg/dL (ref 0–200)
HDL: 29 mg/dL — ABNORMAL LOW (ref >40)
LDL Cholesterol: 67 mg/dL (ref 0–99)
Total CHOL/HDL Ratio: 3.9 RATIO
Triglycerides: 83 mg/dL (ref <150)
VLDL: 17 mg/dL (ref 0–40)

## 2018-03-02 LAB — MAGNESIUM: Magnesium: 1.9 mg/dL (ref 1.7–2.4)

## 2018-03-02 LAB — TROPONIN I
TROPONIN I: 0.13 ng/mL — AB (ref <0.03)
Troponin I: 0.12 ng/mL (ref <0.03)
Troponin I: 0.13 ng/mL (ref <0.03)

## 2018-03-02 LAB — MRSA PCR SCREENING: MRSA by PCR: NEGATIVE

## 2018-03-02 MED ORDER — LEVETIRACETAM 500 MG PO TABS
500.0000 mg | ORAL_TABLET | Freq: Two times a day (BID) | ORAL | Status: DC
  Administered 2018-03-02 (×2): 500 mg via ORAL
  Filled 2018-03-02 (×3): qty 1

## 2018-03-02 MED ORDER — VITAMIN B-12 1000 MCG PO TABS
1000.0000 ug | ORAL_TABLET | Freq: Every day | ORAL | Status: DC
  Administered 2018-03-02: 1000 ug via ORAL
  Filled 2018-03-02: qty 1

## 2018-03-02 MED ORDER — SIMVASTATIN 20 MG PO TABS: 20.0000 mg | ORAL_TABLET | Freq: Every evening | ORAL | Status: DC

## 2018-03-02 MED ORDER — WARFARIN SODIUM 7.5 MG PO TABS: ORAL_TABLET | ORAL | 1 refills | Status: DC

## 2018-03-02 MED ORDER — LEVOTHYROXINE SODIUM 50 MCG PO TABS
50.0000 ug | ORAL_TABLET | Freq: Every day | ORAL | Status: DC
  Administered 2018-03-02: 50 ug via ORAL
  Filled 2018-03-02: qty 1

## 2018-03-02 MED ORDER — WARFARIN SODIUM 5 MG PO TABS: 5.0000 mg | ORAL_TABLET | Freq: Once | ORAL | Status: DC

## 2018-03-02 MED ORDER — METOPROLOL SUCCINATE ER 25 MG PO TB24
25.0000 mg | ORAL_TABLET | Freq: Every day | ORAL | Status: DC
  Administered 2018-03-02: 25 mg via ORAL
  Filled 2018-03-02: qty 1

## 2018-03-02 MED ORDER — ACETAMINOPHEN 325 MG PO TABS: 650.0000 mg | ORAL_TABLET | ORAL | Status: DC | PRN

## 2018-03-02 MED ORDER — BUMETANIDE 2 MG PO TABS
2.0000 mg | ORAL_TABLET | Freq: Every day | ORAL | Status: DC
  Administered 2018-03-02: 2 mg via ORAL
  Filled 2018-03-02: qty 1

## 2018-03-02 MED ORDER — WARFARIN - PHARMACIST DOSING INPATIENT: Freq: Every day | Status: DC

## 2018-03-02 MED ORDER — ONDANSETRON HCL 4 MG/2ML IJ SOLN: 4.0000 mg | Freq: Four times a day (QID) | INTRAMUSCULAR | Status: DC | PRN

## 2018-03-02 MED ORDER — ASPIRIN 325 MG PO TABS
325.0000 mg | ORAL_TABLET | Freq: Every day | ORAL | Status: DC
  Administered 2018-03-02: 325 mg via ORAL
  Filled 2018-03-02: qty 1

## 2018-03-02 MED ORDER — METOLAZONE 2.5 MG PO TABS: 2.5000 mg | ORAL_TABLET | ORAL | Status: DC

## 2018-03-02 MED ORDER — NITROGLYCERIN 0.4 MG SL SUBL: 0.4000 mg | SUBLINGUAL_TABLET | SUBLINGUAL | Status: DC | PRN

## 2018-03-02 MED ORDER — PANTOPRAZOLE SODIUM 40 MG PO TBEC
40.0000 mg | DELAYED_RELEASE_TABLET | Freq: Every day | ORAL | Status: DC
  Administered 2018-03-02: 40 mg via ORAL
  Filled 2018-03-02: qty 1

## 2018-03-02 NOTE — Discharge Instructions (Signed)
Follow with Primary MD [name], DO in 7 days   Get CBC, CMP,  checked  by Primary MD next visit.    Activity: As tolerated with Full fall precautions use walker/cane & assistance as needed   Disposition ALF   Diet: Heart Healthy  , with feeding assistance and aspiration precautions.  For Heart failure patients - Check your Weight same time everyday, if you gain over 2 pounds, or you develop in leg swelling, experience more shortness of breath or chest pain, call your Primary MD immediately. Follow Cardiac Low Salt Diet and 1.5 lit/day fluid restriction.   On your next visit with your primary care physician please Get Medicines reviewed and adjusted.   Please request your Prim.MD to go over all Hospital Tests and Procedure/Radiological results at the follow up, please get all Hospital records sent to your Prim MD by signing hospital release before you go home.   If you experience worsening of your admission symptoms, develop shortness of breath, life threatening emergency, suicidal or homicidal thoughts you must seek medical attention immediately by calling 911 or calling your MD immediately  if symptoms less severe.  You Must read complete instructions/literature along with all the possible adverse reactions/side effects for all the Medicines you take and that have been prescribed to you. Take any new Medicines after you have completely understood and accpet all the possible adverse reactions/side effects.   Do not drive, operating heavy machinery, perform activities at heights, swimming or participation in water activities or provide baby sitting services if your were admitted for syncope or siezures until you have seen by Primary MD or a Neurologist and advised to do so again.  Do not drive when taking Pain medications.    Do not take more than prescribed Pain, Sleep and Anxiety Medications  Special Instructions: If you have smoked or chewed Tobacco  in the last 2  yrs please stop smoking, stop any regular Alcohol  and or any Recreational drug use.  Wear Seat belts while driving.   Please note  You were cared for by a hospitalist during your hospital stay. If you have any questions about your discharge medications or the care you received while you were in the hospital after you are discharged, you can call the unit and asked to speak with the hospitalist on call if the hospitalist that took care of you is not available. Once you are discharged, your primary care physician will handle any further medical issues. Please note that NO REFILLS for any discharge medications will be authorized once you are discharged, as it is imperative that you return to your primary care physician (or establish a relationship with a primary care physician if you do not have one) for your aftercare needs so that they can reassess your need for medications and monitor your lab values.

## 2018-03-02 NOTE — H&P (Addendum)
PCP:   [name], DO  Cardiologist: Dr Ellyn Hack  Code status: DNR  Chief Complaint:  Chest pain  HPI: this is a 85 y/o male with h/o CAD who presents with c/o left sided chest pains. He states he has never had Chest pain even when he had his prior MI. He reports only chest pressure. This pain concerned him and he came to the ER. It lasted approx 30 min. The pain was continuous. He was at rest when the pain started. He denies any SOB or palpitations. His pain is described as dull. at its worse it was 3/10, currently it is 0/10.  He has a h/o CAD and had a CABG  History provided by the patient  Review of Systems:  The patient denies anorexia, fever, weight loss,, vision loss, decreased hearing, hoarseness, chest pain, syncope, dyspnea on exertion, peripheral edema, balance deficits, hemoptysis, abdominal pain, melena, hematochezia, severe indigestion/heartburn, hematuria, incontinence, genital sores, muscle weakness, suspicious skin lesions, transient blindness, difficulty walking, depression, unusual weight change, abnormal bleeding, enlarged lymph nodes, angioedema, and breast masses.  Past Medical History: Past Medical History:  Diagnosis Date  . Brain aneurysm   . Chronic combined systolic and diastolic heart failure, NYHA class 3 (HCC)    EF has seemed to vary report-to-report. Myoview 2007 showed EF 30%, echo in 2016 7 EF 60-65% -> Echo in 08/2015 & 08/2016  40-45%  (with basal-mid inferoseptal & apical inferolateral wall), Mod P HTN (PAP ~53 mHg) --most recent in January 2019 says EF 45-50%.  . CKD (chronic kidney disease), stage III ([place]) 08/26/2016  . COPD (chronic obstructive pulmonary disease) ([place])   . Coronary artery disease involving native heart with angina pectoris ([place])    a. MI 2002 - PCI to [place]. b. 2005: PCI to LAD, OM1 & OM2.  c. s/p CABG in 2013: LIMA to LAD, SVG to OM2, SVG to PDA and SVG to RI. ; d. Cath 2017: occluded LAD, OM1 & OM2, SVG-RI with patent  LIMA-LAD, SVG-rPDA & SVG-OM2.   . Essential hypertension   . Hyperlipidemia with target LDL less than 70   . Moderate pulmonary arterial systolic hypertension (HCC) 08/28/2016   PA pressures F has been estimated at 53-59 mmHg on echo-   . Moderate to severe aortic stenosis 08/28/2016   Stenosis with mild regurgitation.  . Persistent atrial fibrillation   . Presence of biventricular implantable cardioverter-defibrillator (ICD) 2008  . Thyroid disease    Past Surgical History:  Procedure Laterality Date  . APPENDECTOMY    . CARDIAC CATHETERIZATION  06/2015   Left main patent. Diffuse LAD disease with competitive flow and distal LAD from LIMA. Mid Cx 35% with occlusion of OM 1 and 90% dCx. OM 2 occluded (perfuse via SVG). Ostial RCA 70%, dRCA 100%.  Patent LIMA-LAD. Widely patent SVG-OM, SVG-PDA: 40% proximal. 100% proximal SVG-RI  . CARDIAC SURGERY    . CARPAL TUNNEL RELEASE Right 2017  . COLONOSCOPY  09/05/2012   Colon Polyps  . CORONARY ANGIOPLASTY WITH STENT PLACEMENT     Prior PCI to OM 2; 2005 PCI to LAD and OM1 with a Taxus DES 2.5 x 16 mm (postdilated to 3.8 mm) and PTCA of OM 2 stent  . CORONARY ARTERY BYPASS GRAFT  2013   LIMA-LAD, SVG-L1-2, SVG-PDA, SVG-RI (known occlusion of SVG RI)  . KNEE SURGERY Bilateral 1999  . NM MYOVIEW LTD  04/2015   Prior lateral infarct with no scanning. EF 31% with moderate global HK  .  PROSTATE SURGERY    . SHOULDER SURGERY Bilateral    Twice  . TRANSTHORACIC ECHOCARDIOGRAM  08/2015   Moderate LV dysfunction with global HK (estimated at 40 and 45%). Worse anterolateral and inferolateral wall.  . TRANSTHORACIC ECHOCARDIOGRAM  08/2016   EF 40-45%. Moderate concentric LVH. Akinesis of basal-mid inferoseptal and apical inferolateral wall.  Aortic sclerosis, no stenosis. Moderate mitral thickening with mild MR. Moderate RV dilation. Moderate TR and PR. PA pressures 53 mmHg  . TRANSTHORACIC ECHOCARDIOGRAM  02/2017    EF 45-50% (similar to last).   Apical-inferolateral akinesis.  Severe focal basal and moderate concentric LVH.  Mild aortic stenosis.  Elevated PA pressures with peak pressures estimated 59 mmHg (moderate PA hypertension)  . VEIN BYPASS SURGERY  2013    Medications: Prior to Admission medications   Medication Sig Start Date End Date Taking? Authorizing Provider  aspirin 81 MG chewable tablet Chew 81 mg by mouth daily.    [provider]  bumetanide (BUMEX) 2 MG tablet Take 1 tablet (2 mg total) by mouth daily. 01/28/18   Kayleen Memos, DO  cyanocobalamin 1000 MCG tablet Take 1 tablet (1,000 mcg total) by mouth daily. 02/24/17   Hongalgi, Lenis Dickinson, MD  levETIRAcetam (KEPPRA) 500 MG tablet TAKE 1 TABLET BY MOUTH TWICE DAILY Patient taking differently: Take 500 mg by mouth 2 (two) times daily.  11/27/17   [name], DO  levothyroxine (SYNTHROID, LEVOTHROID) 50 MCG tablet TAKE 1 TABLET BY MOUTH ONCE DAILY 02/19/18   Wendling, Crosby Oyster, DO  metolazone (ZAROXOLYN) 2.5 MG tablet Take 1 tablet (2.5 mg total) by mouth every 7 (seven) days. 02/02/18   Kayleen Memos, DO  metoprolol succinate (TOPROL-XL) 25 MG 24 hr tablet Take 1 tablet (25 mg total) by mouth daily. 02/27/18   Leonie Man, MD  omeprazole (PRILOSEC) 20 MG capsule TAKE 1 CAPSULE BY MOUTH ONCE DAILY Patient taking differently: Take 20 mg by mouth daily.  08/04/17   [name], DO  potassium chloride (K-DUR) 10 MEQ tablet Take 10 mEq by mouth daily.    [provider]  simvastatin (ZOCOR) 20 MG tablet TAKE 1 TABLET BY MOUTH ONCE DAILY IN THE EVENING 02/12/18   [name], DO  warfarin (COUMADIN) 6 MG tablet Take everyday except Mondays and Fridays. On Mondays and Fridays, take 7.5mg  (separate rx). Updated 10/28/17. 02/02/18   [name], DO  warfarin (COUMADIN) 7.5 MG tablet Take 1 tab on Mondays and Fridays. 01/30/18   [name], DO    Allergies:   Allergies  Allergen Reactions  .  Lipitor  [Atorvastatin]   . Nitroglycerin Other (See Comments)    DROPS BLOOD PRESSURE DRASTICALLY Other reaction(s): Other (See Comments) BP drops    Social History:  reports that he quit smoking about 50 years ago. His smoking use included cigarettes. He has never used smokeless tobacco. He reports current alcohol use of about 6.0 standard drinks of alcohol per week. He reports that he does not use drugs.  Family History: Family History  Problem Relation Age of Onset  . Arthritis Mother   . Heart disease Father     Physical Exam: Vitals:   03/01/18 2330 03/01/18 2345 03/02/18 0000 03/02/18 0119  BP: 125/82  116/68 132/80  Pulse:  (!) 58 60 61  Resp:  17 17 20   Temp:    97.6 F (36.4 C)  TempSrc:    Oral  SpO2:  98% 92% 97%  Weight:  Height:        General:  Alert and oriented times three, well developed and nourished, no acute distress Eyes: PERRLA, pink conjunctiva, no scleral icterus ENT: Moist oral mucosa, neck supple, no thyromegaly, skin cancer tip of nose Lungs: clear to ascultation, no wheeze, no crackles, no use of accessory muscles Cardiovascular: regular rate and rhythm, no regurgitation, no gallops,  murmurs. No carotid bruits, no JVD. PPM/defib, no reproducible chest wall pain Abdomen: soft, positive BS, non-tender, non-distended, no organomegaly, not an acute abdomen GU: not examined Neuro: CN II - XII grossly intact, sensation intact Musculoskeletal: strength 5/5 all extremities, no clubbing, cyanosis or edema Skin: no rash, no subcutaneous crepitation, no decubitus Psych: appropriate patient   Labs on Admission:  Recent Labs    03/01/18 2139  NA 134*  K 3.8  CL 100  CO2 24  GLUCOSE 153*  BUN 54*  CREATININE 2.13*  CALCIUM 9.7   No results for input(s): AST, ALT, ALKPHOS, BILITOT, PROT, ALBUMIN in the last 72 hours. No results for input(s): LIPASE, AMYLASE in the last 72 hours. Recent Labs    03/01/18 2139  WBC 4.1  HGB 11.1*  HCT  36.1*  MCV 98.9  PLT 213   Recent Labs    03/01/18 2139  TROPONINI 0.12*   Invalid input(s): POCBNP No results for input(s): DDIMER in the last 72 hours. No results for input(s): HGBA1C in the last 72 hours. No results for input(s): CHOL, HDL, LDLCALC, TRIG, CHOLHDL, LDLDIRECT in the last 72 hours. No results for input(s): TSH, T4TOTAL, T3FREE, THYROIDAB in the last 72 hours.  Invalid input(s): FREET3 No results for input(s): VITAMINB12, FOLATE, FERRITIN, TIBC, IRON, RETICCTPCT in the last 72 hours.  Micro Results: No results found for this or any previous visit (from the past 240 hour(s)).   Radiological Exams on Admission: Dg Chest 2 View  Result Date: 03/01/2018 CLINICAL DATA:  Chest pain EXAM: CHEST - 2 VIEW COMPARISON:  01/23/2018 FINDINGS: Post sternotomy changes. Left-sided pacing device appears stable. No focal consolidation or effusion. Mild cardiomegaly with aortic atherosclerosis. No pneumothorax. Ankylosis of the spine. IMPRESSION: No active cardiopulmonary disease.  Stable cardiomegaly. Electronically Signed   By: Donavan Foil M.D.   On: 03/01/2018 21:46    Assessment/Plan Present on Admission: . Chest pain/CAD/Biventricular ICD/PPM -Bring in for 23-hour observation on med telemetry -Cycle cardiac enzymes, lipid panel in a.m. -Consult cardiology -Aspirin, beta-blocker, statin  Chronically elevated troponin -Likely due to his chronic kidney disease.  Will monitor  . Chronic combined systolic and diastolic heart failure, NYHA class 3 (HCC) -Stable, home meds resumed including metoprolol and Zaroxolyn  . CKD (chronic kidney disease), stage III (HCC) -Stable, at baseline  . COPD (chronic obstructive pulmonary disease) (HCC) -Stable, home meds resumed  . Essential hypertension -Stable, home meds resumed  . Hyperlipidemia with target LDL less than 70 -Stable, home meds resumed  . Persistent atrial fibrillation ([place]); CHA2DS2Vasc = ~4 (CHF, MI/aortic  plaque, Age 60) -Stable, home meds resumed -Pharmacy to manage Coumadin  . Thyroid disease -Stable, home meds resumed  History of seizures -Stable, home meds resumed  Burrell Hodapp 03/02/2018, 1:57 AM

## 2018-03-02 NOTE — Progress Notes (Signed)
ANTICOAGULATION CONSULT NOTE - follow up Pharmacy Consult for Coumadin Indication: h/o  atrial fibrillation  Allergies  Allergen Reactions  . Lipitor  [Atorvastatin]   . Nitroglycerin Other (See Comments)    DROPS BLOOD PRESSURE DRASTICALLY Other reaction(s): Other (See Comments) BP drops    Patient Measurements: Height: 5\' 8"  (172.7 cm) Weight: 194 lb 14.2 oz (88.4 kg) IBW/kg (Calculated) : 68.4  Vital Signs: Temp: 97.6 F (36.4 C) (01/20 0455) Temp Source: Oral (01/20 0119) BP: 106/67 (01/20 0455) Pulse Rate: 56 (01/20 0455)  Labs: Recent Labs    03/01/18 2139 03/02/18 0215 03/02/18 0217 03/02/18 0800  HGB 11.1*  --   --   --   HCT 36.1*  --   --   --   PLT 213  --   --   --   LABPROT 38.5*  --  40.1*  --   INR 4.02*  --  4.24*  --   CREATININE 2.13*  --   --   --   CKTOTAL  --  74  --  93  CKMB  --  2.8  --  2.9  TROPONINI 0.12* 0.13*  --  0.12*    Estimated Creatinine Clearance: 25.4 mL/min (A) (by C-G formula based on SCr of 2.13 mg/dL (H)).   Medical History: Past Medical History:  Diagnosis Date  . Brain aneurysm   . Chronic combined systolic and diastolic heart failure, NYHA class 3 (HCC)    EF has seemed to vary report-to-report. Myoview 2007 showed EF 30%, echo in 2016 7 EF 60-65% -> Echo in 08/2015 & 08/2016  40-45%  (with basal-mid inferoseptal & apical inferolateral wall), Mod P HTN (PAP ~53 mHg) --most recent in January 2019 says EF 45-50%.  . CKD (chronic kidney disease), stage III ([place]) 08/26/2016  . COPD (chronic obstructive pulmonary disease) ([place])   . Coronary artery disease involving native heart with angina pectoris ([place])    a. MI 2002 - PCI to [place]. b. 2005: PCI to LAD, OM1 & OM2.  c. s/p CABG in 2013: LIMA to LAD, SVG to OM2, SVG to PDA and SVG to RI. ; d. Cath 2017: occluded LAD, OM1 & OM2, SVG-RI with patent LIMA-LAD, SVG-rPDA & SVG-OM2.   . Essential hypertension   . Hyperlipidemia with target LDL less than 70   . Moderate pulmonary  arterial systolic hypertension (HCC) 08/28/2016   PA pressures F has been estimated at 53-59 mmHg on echo-   . Moderate to severe aortic stenosis 08/28/2016   Stenosis with mild regurgitation.  . Persistent atrial fibrillation   . Presence of biventricular implantable cardioverter-defibrillator (ICD) 2008  . Thyroid disease     Medications:  Medications Prior to Admission  Medication Sig Dispense Refill Last Dose  . aspirin 81 MG chewable tablet Chew 81 mg by mouth daily.   Taking  . bumetanide (BUMEX) 2 MG tablet Take 1 tablet (2 mg total) by mouth daily. 30 tablet 0 Taking  . cyanocobalamin 1000 MCG tablet Take 1 tablet (1,000 mcg total) by mouth daily. 30 tablet 0 Taking  . levETIRAcetam (KEPPRA) 500 MG tablet TAKE 1 TABLET BY MOUTH TWICE DAILY (Patient taking differently: Take 500 mg by mouth 2 (two) times daily. ) 60 tablet 2 Taking  . levothyroxine (SYNTHROID, LEVOTHROID) 50 MCG tablet TAKE 1 TABLET BY MOUTH ONCE DAILY 90 tablet 0   . metolazone (ZAROXOLYN) 2.5 MG tablet Take 1 tablet (2.5 mg total) by mouth every 7 (seven) days. 30 tablet 0  Taking  . metoprolol succinate (TOPROL-XL) 25 MG 24 hr tablet Take 1 tablet (25 mg total) by mouth daily. 90 tablet 1   . omeprazole (PRILOSEC) 20 MG capsule TAKE 1 CAPSULE BY MOUTH ONCE DAILY (Patient taking differently: Take 20 mg by mouth daily. ) 90 capsule 2 Taking  . potassium chloride (K-DUR) 10 MEQ tablet Take 10 mEq by mouth daily.   Taking  . simvastatin (ZOCOR) 20 MG tablet TAKE 1 TABLET BY MOUTH ONCE DAILY IN THE EVENING 90 tablet 0 Taking  . warfarin (COUMADIN) 6 MG tablet Take everyday except Mondays and Fridays. On Mondays and Fridays, take 7.5mg  (separate rx). Updated 10/28/17. 90 tablet 1 Taking  . warfarin (COUMADIN) 7.5 MG tablet Take 1 tab on Mondays and Fridays. 90 tablet 1 Taking    Assessment: 85 y.o. male admitted with chest pain, h/o Afib, to continue Coumadin.  INR supratherapeutic last night on admission.  Thus coumadin  dose held last night.  Unknown date/time of last dose taken PTA.  Today INR is 4.24, remains supratherapeutic.  pltc wnl , h/h 11.1/36.1 on admit 03/01/18.  No bleeding reported. PTA medication history is in process of being completed.  Will f/u on pta coumadin dose and last dose taken date.      Goal of Therapy:  INR 2-3 Monitor platelets by anticoagulation protocol: Yes   Plan:  Hold Coumadin today F/U daily INR  [name], RPh Clinical Pharmacist [phone] Please check AMION for all [place] phone numbers After 10:00 PM, call [place] [phone] 03/02/2018,11:33 AM

## 2018-03-02 NOTE — Progress Notes (Signed)
ANTICOAGULATION CONSULT NOTE - Initial Consult  Pharmacy Consult for Coumadin Indication: atrial fibrillation  Allergies  Allergen Reactions  . Lipitor  [Atorvastatin]   . Nitroglycerin Other (See Comments)    DROPS BLOOD PRESSURE DRASTICALLY Other reaction(s): Other (See Comments) BP drops    Patient Measurements: Height: 5\' 8"  (172.7 cm) Weight: 199 lb 15.3 oz (90.7 kg) IBW/kg (Calculated) : 68.4  Vital Signs: Temp: 97.6 F (36.4 C) (01/20 0119) Temp Source: Oral (01/20 0119) BP: 132/80 (01/20 0119) Pulse Rate: 61 (01/20 0119)  Labs: Recent Labs    03/01/18 2139  HGB 11.1*  HCT 36.1*  PLT 213  LABPROT 38.5*  INR 4.02*  CREATININE 2.13*  TROPONINI 0.12*    Estimated Creatinine Clearance: 25.7 mL/min (A) (by C-G formula based on SCr of 2.13 mg/dL (H)).   Medical History: Past Medical History:  Diagnosis Date  . Brain aneurysm   . Chronic combined systolic and diastolic heart failure, NYHA class 3 (HCC)    EF has seemed to vary report-to-report. Myoview 2007 showed EF 30%, echo in 2016 7 EF 60-65% -> Echo in 08/2015 & 08/2016  40-45%  (with basal-mid inferoseptal & apical inferolateral wall), Mod P HTN (PAP ~53 mHg) --most recent in January 2019 says EF 45-50%.  . CKD (chronic kidney disease), stage III ([place]) 08/26/2016  . COPD (chronic obstructive pulmonary disease) ([place])   . Coronary artery disease involving native heart with angina pectoris ([place])    a. MI 2002 - PCI to [place]. b. 2005: PCI to LAD, OM1 & OM2.  c. s/p CABG in 2013: LIMA to LAD, SVG to OM2, SVG to PDA and SVG to RI. ; d. Cath 2017: occluded LAD, OM1 & OM2, SVG-RI with patent LIMA-LAD, SVG-rPDA & SVG-OM2.   . Essential hypertension   . Hyperlipidemia with target LDL less than 70   . Moderate pulmonary arterial systolic hypertension (HCC) 08/28/2016   PA pressures F has been estimated at 53-59 mmHg on echo-   . Moderate to severe aortic stenosis 08/28/2016   Stenosis with mild regurgitation.  .  Persistent atrial fibrillation   . Presence of biventricular implantable cardioverter-defibrillator (ICD) 2008  . Thyroid disease     Medications:  Medications Prior to Admission  Medication Sig Dispense Refill Last Dose  . aspirin 81 MG chewable tablet Chew 81 mg by mouth daily.   Taking  . bumetanide (BUMEX) 2 MG tablet Take 1 tablet (2 mg total) by mouth daily. 30 tablet 0 Taking  . cyanocobalamin 1000 MCG tablet Take 1 tablet (1,000 mcg total) by mouth daily. 30 tablet 0 Taking  . levETIRAcetam (KEPPRA) 500 MG tablet TAKE 1 TABLET BY MOUTH TWICE DAILY (Patient taking differently: Take 500 mg by mouth 2 (two) times daily. ) 60 tablet 2 Taking  . levothyroxine (SYNTHROID, LEVOTHROID) 50 MCG tablet TAKE 1 TABLET BY MOUTH ONCE DAILY 90 tablet 0   . metolazone (ZAROXOLYN) 2.5 MG tablet Take 1 tablet (2.5 mg total) by mouth every 7 (seven) days. 30 tablet 0 Taking  . metoprolol succinate (TOPROL-XL) 25 MG 24 hr tablet Take 1 tablet (25 mg total) by mouth daily. 90 tablet 1   . omeprazole (PRILOSEC) 20 MG capsule TAKE 1 CAPSULE BY MOUTH ONCE DAILY (Patient taking differently: Take 20 mg by mouth daily. ) 90 capsule 2 Taking  . potassium chloride (K-DUR) 10 MEQ tablet Take 10 mEq by mouth daily.   Taking  . simvastatin (ZOCOR) 20 MG tablet TAKE 1 TABLET BY MOUTH ONCE  DAILY IN THE EVENING 90 tablet 0 Taking  . warfarin (COUMADIN) 6 MG tablet Take everyday except Mondays and Fridays. On Mondays and Fridays, take 7.5mg  (separate rx). Updated 10/28/17. 90 tablet 1 Taking  . warfarin (COUMADIN) 7.5 MG tablet Take 1 tab on Mondays and Fridays. 90 tablet 1 Taking    Assessment: 85 y.o. male admitted with chest pain, h/o Afib, to continue Coumadin.  INR supratherapeutic tonight.   Goal of Therapy:  INR 2-3 Monitor platelets by anticoagulation protocol: Yes   Plan:  No Coumadin today F/U daily INR  Danielly Ackerley, Bronson Curb 03/02/2018,1:57 AM

## 2018-03-02 NOTE — Clinical Social Work Note (Signed)
CSW confirmed with pt's daughter that pt is from [facility] (ALF). CSW confirmed with facility that pt can return today. Fax number confirmed with Silva Bandy ( in admissions) at [facility] & d/c summary faxed. CSW confirmed with pt's daughter that she will transport pt back to facility. Pt to contact pt's daughter once RN has cleared pt. RN to call report to [phone]. Clinical Social Worker will sign off for now as social work intervention is no longer needed. Please consult [place] again if new need arises.   Kenneth Bell 03/02/2018

## 2018-03-02 NOTE — Consult Note (Addendum)
Cardiology Consultation:   Patient ID: Kenneth Bell; [id number]; 17-May-1926   Admit date: 03/01/2018 Date of Consult: 03/02/2018  Primary Care Provider: [name], DO Primary Cardiologist: Glenetta Hew, MD  Primary Electrophysiologist:  Sanda Klein, MD   Patient Profile:   Kenneth Bell is a 85 y.o. male with a hx of S-CHF, CABG 2013 w/ LIMA-LAD, SVG-OM2, SVG-RI, SVG-PDA,   (grafts patent at cath 2017 except SVG-RI), HTN, HLD, CKD III, mod-severe AS, PAH, CHB w/ BSCi CRT-D (100% BiV pacing), hypothyroid, persistent atrial fib, COPD, skin CA s/p 10 hr surgery on his nose (still healing), who is being seen today for the evaluation of chest pain at the request of Dr Waldron Labs.  History of Present Illness:   Mr. Sobotka was admitted early this am w/ CP, cards asked to see.   He had onset of SSCP on 01/17. It was a 3/10. It did not change his SOB, was SOB already. No N&V or diaphoresis. Never had before. His MI symptom was chest pressure, this is more of a dull pain.  He took Tylenol, no help. No other meds tried. He had >10 episodes, length varied, but generally a few minutes. No clear association w/ exertion.  Does not know of anything that made it better.  It would just go away by itself.  Has been weighing daily, no change in wt. Denies LE edema, +PND, denies orthopnea.   Has been having sharp pains. Intermittent, not in the joints. In his back, both arms, legs at times. Not in all places all the time. Worse w/ deep inspiration and certain movements. Pain got better w/ heating pad. Has tried Tylenol with some improvement. Would like something stronger. IM is wondering if that pain is from the statin. Has had muscle pain w/ Lipitor, but cannot remember exactly what it felt like.   He saw an orthopedist for the pain, was told that surgery was not recommended due to age and comorbidities. Pt agrees.    Past Medical History:  Diagnosis Date  . Brain aneurysm   .  Chronic combined systolic and diastolic heart failure, NYHA class 3 (HCC)    EF has seemed to vary report-to-report. Myoview 2007 showed EF 30%, echo in 2016 7 EF 60-65% -> Echo in 08/2015 & 08/2016  40-45%  (with basal-mid inferoseptal & apical inferolateral wall), Mod P HTN (PAP ~53 mHg) --most recent in January 2019 says EF 45-50%.  . CKD (chronic kidney disease), stage III ([place]) 08/26/2016  . COPD (chronic obstructive pulmonary disease) ([place])   . Coronary artery disease involving native heart with angina pectoris ([place])    a. MI 2002 - PCI to [place]. b. 2005: PCI to LAD, OM1 & OM2.  c. s/p CABG in 2013: LIMA to LAD, SVG to OM2, SVG to PDA and SVG to RI. ; d. Cath 2017: occluded LAD, OM1 & OM2, SVG-RI with patent LIMA-LAD, SVG-rPDA & SVG-OM2.   . Essential hypertension   . Hyperlipidemia with target LDL less than 70   . Moderate pulmonary arterial systolic hypertension (HCC) 08/28/2016   PA pressures F has been estimated at 53-59 mmHg on echo-   . Moderate to severe aortic stenosis 08/28/2016   Stenosis with mild regurgitation.  . Persistent atrial fibrillation   . Presence of biventricular implantable cardioverter-defibrillator (ICD) 2008  . Thyroid disease     Past Surgical History:  Procedure Laterality Date  . APPENDECTOMY    . CARDIAC CATHETERIZATION  06/2015   Left main patent. Diffuse  LAD disease with competitive flow and distal LAD from LIMA. Mid Cx 35% with occlusion of OM 1 and 90% dCx. OM 2 occluded (perfuse via SVG). Ostial RCA 70%, dRCA 100%.  Patent LIMA-LAD. Widely patent SVG-OM, SVG-PDA: 40% proximal. 100% proximal SVG-RI  . CARDIAC SURGERY    . CARPAL TUNNEL RELEASE Right 2017  . COLONOSCOPY  09/05/2012   Colon Polyps  . CORONARY ANGIOPLASTY WITH STENT PLACEMENT     Prior PCI to OM 2; 2005 PCI to LAD and OM1 with a Taxus DES 2.5 x 16 mm (postdilated to 3.8 mm) and PTCA of OM 2 stent  . CORONARY ARTERY BYPASS GRAFT  2013   LIMA-LAD, SVG-L1-2, SVG-PDA, SVG-RI (known occlusion  of SVG RI)  . KNEE SURGERY Bilateral 1999  . NM MYOVIEW LTD  04/2015   Prior lateral infarct with no scanning. EF 31% with moderate global HK  . PROSTATE SURGERY    . SHOULDER SURGERY Bilateral    Twice  . TRANSTHORACIC ECHOCARDIOGRAM  08/2015   Moderate LV dysfunction with global HK (estimated at 40 and 45%). Worse anterolateral and inferolateral wall.  . TRANSTHORACIC ECHOCARDIOGRAM  08/2016   EF 40-45%. Moderate concentric LVH. Akinesis of basal-mid inferoseptal and apical inferolateral wall.  Aortic sclerosis, no stenosis. Moderate mitral thickening with mild MR. Moderate RV dilation. Moderate TR and PR. PA pressures 53 mmHg  . TRANSTHORACIC ECHOCARDIOGRAM  02/2017    EF 45-50% (similar to last).  Apical-inferolateral akinesis.  Severe focal basal and moderate concentric LVH.  Mild aortic stenosis.  Elevated PA pressures with peak pressures estimated 59 mmHg (moderate PA hypertension)  . VEIN BYPASS SURGERY  2013     Prior to Admission medications   Medication Sig Start Date End Date Taking? Authorizing Provider  aspirin 81 MG chewable tablet Chew 81 mg by mouth daily.    [provider]  bumetanide (BUMEX) 2 MG tablet Take 1 tablet (2 mg total) by mouth daily. 01/28/18   Kayleen Memos, DO  cyanocobalamin 1000 MCG tablet Take 1 tablet (1,000 mcg total) by mouth daily. 02/24/17   Hongalgi, Lenis Dickinson, MD  levETIRAcetam (KEPPRA) 500 MG tablet TAKE 1 TABLET BY MOUTH TWICE DAILY Patient taking differently: Take 500 mg by mouth 2 (two) times daily.  11/27/17   [name], DO  levothyroxine (SYNTHROID, LEVOTHROID) 50 MCG tablet TAKE 1 TABLET BY MOUTH ONCE DAILY 02/19/18   Wendling, Crosby Oyster, DO  metolazone (ZAROXOLYN) 2.5 MG tablet Take 1 tablet (2.5 mg total) by mouth every 7 (seven) days. 02/02/18   Kayleen Memos, DO  metoprolol succinate (TOPROL-XL) 25 MG 24 hr tablet Take 1 tablet (25 mg total) by mouth daily. 02/27/18   Leonie Man, MD  omeprazole (PRILOSEC) 20  MG capsule TAKE 1 CAPSULE BY MOUTH ONCE DAILY Patient taking differently: Take 20 mg by mouth daily.  08/04/17   [name], DO  potassium chloride (K-DUR) 10 MEQ tablet Take 10 mEq by mouth daily.    [provider]  simvastatin (ZOCOR) 20 MG tablet TAKE 1 TABLET BY MOUTH ONCE DAILY IN THE EVENING 02/12/18   [name], DO  warfarin (COUMADIN) 6 MG tablet Take everyday except Mondays and Fridays. On Mondays and Fridays, take 7.5mg  (separate rx). Updated 10/28/17. 02/02/18   [name], DO  warfarin (COUMADIN) 7.5 MG tablet Take 1 tab on Mondays and Fridays. 01/30/18   [name], DO    Inpatient Medications: Scheduled Meds: . aspirin  325 mg Oral Daily  . bumetanide  2 mg Oral Daily  . levETIRAcetam  500 mg Oral BID  . levothyroxine  50 mcg Oral Daily  . metolazone  2.5 mg Oral Q7 days  . metoprolol succinate  25 mg Oral Daily  . pantoprazole  40 mg Oral Daily  . simvastatin  20 mg Oral QPM  . sodium chloride flush  3 mL Intravenous Once  . cyanocobalamin  1,000 mcg Oral Daily  . Warfarin - Pharmacist Dosing Inpatient   Does not apply q1800   Continuous Infusions:  PRN Meds: acetaminophen, nitroGLYCERIN, ondansetron (ZOFRAN) IV  Allergies:    Allergies  Allergen Reactions  . Lipitor  [Atorvastatin]   . Nitroglycerin Other (See Comments)    DROPS BLOOD PRESSURE DRASTICALLY Other reaction(s): Other (See Comments) BP drops    Social History:   Social History   Socioeconomic History  . Marital status: Married    Spouse name: Not on file  . Number of children: Not on file  . Years of education: Not on file  . Highest education level: Not on file  Occupational History  . Not on file  Social Needs  . Financial resource strain: Not on file  . Food insecurity:    Worry: Not on file    Inability: Not on file  . Transportation needs:    Medical: Not on file    Non-medical: Not on file  Tobacco Use  . Smoking  status: Former Smoker    Types: Cigarettes    Last attempt to quit: 05/30/1967    Years since quitting: 50.7  . Smokeless tobacco: Never Used  Substance and Sexual Activity  . Alcohol use: Yes    Alcohol/week: 6.0 standard drinks    Types: 6 Glasses of wine per week    Comment: weekly  . Drug use: No  . Sexual activity: Not on file  Lifestyle  . Physical activity:    Days per week: Not on file    Minutes per session: Not on file  . Stress: Not on file  Relationships  . Social connections:    Talks on phone: Not on file    Gets together: Not on file    Attends religious service: Not on file    Active member of club or organization: Not on file    Attends meetings of clubs or organizations: Not on file    Relationship status: Not on file  . Intimate partner violence:    Fear of current or ex partner: Not on file    Emotionally abused: Not on file    Physically abused: Not on file    Forced sexual activity: Not on file  Other Topics Concern  . Not on file  Social History Narrative   He has been married for 95 years. They have 5 children and 10 grandchildren. 12 great-grandchildren.   He recently moved to [facility], [place] and oriented close to his daughter here. His wife has been moved into an Alzheimer's/memory unit nearby.   He currently lives with his daughter.   He has a college degree and previously worked as a self-employed Materials engineer.   He himself quit driving couple years ago because he was concerned about his reaction time. He is therefore depend upon his family or public transportation.   He quit smoking in 1969 having smoked for 20 years.   He drinks maybe 4-7 drinks alcohol/wine a week.   He does not exercise any longer because of  shortness of breath and lack of energy.    Family History:   Family History  Problem Relation Age of Onset  . Arthritis Mother   . Heart disease Father    Family Status:  Family Status  Relation Name Status  . Mother   Deceased  . Father  Deceased  . Sister  Deceased  . Brother  Deceased    ROS:  Please see the history of present illness.  All other ROS reviewed and negative.     Physical Exam/Data:   Vitals:   03/01/18 2345 03/02/18 0000 03/02/18 0119 03/02/18 0455  BP:  116/68 132/80 106/67  Pulse: (!) 58 60 61 (!) 56  Resp: 17 17 20 20   Temp:   97.6 F (36.4 C) 97.6 F (36.4 C)  TempSrc:   Oral   SpO2: 98% 92% 97% 98%  Weight:   88.4 kg   Height:        Intake/Output Summary (Last 24 hours) at 03/02/2018 1246 Last data filed at 03/02/2018 8756 Gross per 24 hour  Intake 100 ml  Output 400 ml  Net -300 ml   Filed Weights   03/01/18 2046 03/02/18 0119  Weight: 90.7 kg 88.4 kg   Body mass index is 29.63 kg/m.  General:  Well nourished, well developed, in no acute distress HEENT: normal Lymph: no adenopathy Neck: JVD approximately 9 cm Endocrine:  No thryomegaly Vascular: No carotid bruits; 4/4 extremity pulses 2+, without bruits  Cardiac:  normal S1, S2; RRR; 2/6 murmur  Lungs: Dry rales bilaterally, no wheezing, rhonchi   Abd: soft, nontender, no hepatomegaly  Ext: no edema Musculoskeletal:  No deformities, BUE and BLE strength normal and equal Skin: warm and dry  Neuro:  CNs 2-12 intact, no focal abnormalities noted Psych:  Normal affect   Last 3 Weights 03/02/2018 03/01/2018 02/12/2018  Weight (lbs) 194 lb 14.2 oz 199 lb 15.3 oz 200 lb  Weight (kg) 88.4 kg 90.7 kg 90.719 kg     EKG:  The EKG was personally reviewed and demonstrates: 1/20 ECG is atrial fibrillation with V pacing and a few PVCs, heart rate 67 Telemetry:  Telemetry was personally reviewed and demonstrates: Atrial fibrillation with ventricular pacing  Relevant CV Studies:  ECHO:  01/24/18 Left ventricle: The cavity size was normal. Wall thickness was increased in a pattern of moderate LVH. Systolic function was moderately reduced. The estimated ejection fraction was in the range of 35% to 40%.  Diffuse hypokinesis. There is akinesis of the basalinferior myocardium. There is hypokinesis of the inferolateral myocardium. Indeterminate diastolic function. - Aortic valve: Trileaflet; moderately calcified leaflets. There was probable moderate to severe low gradient stenosis. There was trivial regurgitation. Mean gradient (S): 14 mm Hg. Peak gradient (S): 22 mm Hg. Valve area (VTI): 1.1 cm^2. Valve area (Vmax): 1.21 cm^2. Mean gradient (S): 14 mm Hg. Peak gradient (S): 22 mm Hg. - Aortic root: The aortic root was mildly ectatic. - Ascending aorta: The ascending aorta was mildly dilated. - Mitral valve: Mildly calcified annulus. There was mild regurgitation. - Left atrium: The atrium was moderately dilated. - Right ventricle: Pacer wire or catheter noted in right ventricle. Systolic function was mildly reduced. - Right atrium: The atrium was mildly dilated. - Atrial septum: No defect or patent foramen ovale was identified. - Tricuspid valve: There was mild regurgitation. - Pulmonary arteries: PA peak pressure: 49 mm Hg (S). - Pericardium, extracardiac: There was no pericardial effusion.  CATH: 06/26/2015  PROCEDURE FINDINGS: (ANGIOGRAPHIC DATA)  1. Left main:Angiographically free of obstructive coronary artery  disease. 2. Left anterior descending artery:Diffusely diseased vessel with  competitive flow noted in the distal LAD. 3. Left circumflex:35% mid vessel disease.Following this, first  obtuse marginal branch vessel is 100% occluded at previously placed  stent.The distal circumflex is with 90% caliber vessel disease.  Second obtuse marginal branch vessel is also with 100% occlusion and  this is perfuse by way of the saphenous vein graft (please see below). 4. Right coronary artery:70% long ostial disease segment with diffuse  disease thereafter, 100% occlusion of distal segment.  GRAFT ANGIOGRAPHY: 1. LIMA to EXH:BZJIRC  patent graft with no stenosis noted at the  anastomotic site.The native LAD is with minimal luminal  irregularities. 2. Saphenous vein graft to the second obtuse marginal branch vessel:  Widely patent graft with no stenosis noted at the anastomotic site.  Luminal irregularities within the second obtuse marginal branch vessel,  which is a smaller caliber vessel.There is some retrograde filling to  the first obtuse marginal branch vessel. 3. Saphenous vein graft to the right posterior descending artery:Long  segment of 40% proximal vessel disease with similar long segment of 40%  distal vessel disease.The right posterior descending artery itself  was  diffusely diseased and it is a small caliber vessel. 4. Saphenous vein graft to the ramus intermedius:100% occluded in its  proximal segment.  PROCEDURE FINDINGS: 1. Severe native 3 vessel coronary artery disease as detailed. 2. Patent 3 of 4 grafts as detailed with unchanged coronary anatomy from  coronary angiogram in 2013 in [place].  PROCEDURE RECOMMENDATIONS:Continue aggressive risk factor modification as well as medical therapy.  Laboratory Data:  Chemistry Recent Labs  Lab 03/01/18 2139  NA 134*  K 3.8  CL 100  CO2 24  GLUCOSE 153*  BUN 54*  CREATININE 2.13*  CALCIUM 9.7  GFRNONAA 27*  GFRAA 31*  ANIONGAP 10    Lab Results  Component Value Date   ALT 14 12/23/2017   AST 22 12/23/2017   ALKPHOS 73 12/23/2017   BILITOT 0.8 12/23/2017   Hematology Recent Labs  Lab 03/01/18 2139  WBC 4.1  RBC 3.65*  HGB 11.1*  HCT 36.1*  MCV 98.9  MCH 30.4  MCHC 30.7  RDW 14.9  PLT 213   Cardiac Enzymes Recent Labs  Lab 03/01/18 2139 03/02/18 0215 03/02/18 0800  TROPONINI 0.12* 0.13* 0.12*     BNP Recent Labs  Lab 03/01/18 2139  BNP 649.8*    TSH:  Lab Results  Component Value Date   TSH 2.601 02/21/2017   Lipids: Lab Results  Component Value Date   CHOL  113 03/02/2018   HDL 29 (L) 03/02/2018   LDLCALC 67 03/02/2018   TRIG 83 03/02/2018   CHOLHDL 3.9 03/02/2018   HgbA1c: Lab Results  Component Value Date   HGBA1C 6.3 (H) 02/21/2017   Magnesium:  Magnesium  Date Value Ref Range Status  03/02/2018 1.9 1.7 - 2.4 mg/dL Final    Comment:    Performed at [facility], [place] [address], [place], [place] [address]   Lab Results  Component Value Date   INR 4.24 (HH) 03/02/2018   INR 4.02 (HH) 03/01/2018   INR 2.60 01/27/2018     Radiology/Studies:  Dg Chest 2 View  Result Date: 03/01/2018 CLINICAL DATA:  Chest pain EXAM: CHEST - 2 VIEW COMPARISON:  01/23/2018 FINDINGS: Post sternotomy changes. Left-sided pacing device appears stable. No focal consolidation or effusion. Mild cardiomegaly with aortic atherosclerosis.  No pneumothorax. Ankylosis of the spine. IMPRESSION: No active cardiopulmonary disease.  Stable cardiomegaly. Electronically Signed   By: Donavan Foil M.D.   On: 03/01/2018 21:46    Assessment and Plan:   1.  Chest pain: -He has known coronary artery disease with substrate for angina in distal vessels and in the ramus intermedius (graft is occluded) -Mild elevation in troponin, however flat trend - See last cath report above.  Has substrate for angina.  However, with elevated creatinine, medical therapy may be the best option. - Would like to try adding Imdur 15 mg qd and see how tolerated, however, pt had hypotension w/ nitrates in the past, avoid.  -At this time, no plans for invasive work-up.  2.  Chronic combined CHF: -His aortic stenosis may contribute to his shortness of breath. - He describes PND, but denies orthopnea. - I wonder if he also has sleep apnea. - The lowest recent weight in the system was 83.7 kilograms.  However, at that time, his creatinine was 2.41. - Discuss with MD if we should give him 1 or 2 doses of IV Lasix to see if decreasing his volume would help his respiratory status/chest pain -  Watch for hypoxia, he may need home O2  3.  Aortic stenosis: -Upon review of the echo report above, there is concern for moderate-severe low output aortic stenosis. - However, with elevated creatinine and other comorbidities, do not believe he is a TAVR candidate.  His creatinine is elevated to the point they would not be able to do the heart catheterization and CT that are needed to evaluate him.  4.  Chronic atrial fibrillation: - He is by V pacing.  The lower rate is set at 60.  He is in VVIR mode. -No changes  Otherwise, per IM Active Problems:   Persistent atrial fibrillation (HCC); CHA2DS2Vasc = ~4 (CHF, MI/aortic plaque, Age 28)   Chronic combined systolic and diastolic heart failure, NYHA class 3 (HCC)   CKD (chronic kidney disease), stage III (HCC)   Coronary artery disease involving coronary bypass graft of native heart with angina pectoris (HCC)   Biventricular ICD (implantable cardioverter-defibrillator) in place   Thyroid disease   Hyperlipidemia with target LDL less than 70   Essential hypertension   COPD (chronic obstructive pulmonary disease) (HCC)   CHF (congestive heart failure) (HCC)   Chest pain   For questions or updates, please contact [facility] Please consult [email or web address] for contact info under Cardiology/STEMI.   Signed, Rosaria Ferries, PA-C  03/02/2018 12:46 PM  Patient seen and examined and history reviewed. Agree with above findings and plan. 85 yo WM with complex PMH. Remote CABG. By cath in 2017 has occlusion of SVG to ramus but other grafts patent. He has chronic systolic CHF with EF 53-61% in December. He has aortic stenosis in the moderate range. Co-morbidities include COPD, hypothyroidism, pacemaker, CKD and age.  He presents to the hospital with complaint of mid precordial pain described as a dull ache. No radiation. Pain lasts 2-40 minutes. Not related to activity and different from prior cardiac pain which is more pressure. There has been no  change in chronic dyspnea. No orthopnea or PND. Weight is stable. No increase in edema. His biggest complaint to me is severe sharp pain at the base of his neck running down his right arm and some pain in left leg.   On exam he is a frail elderly WM. Kyphotic. He has some JVD. Lungs are clear. CV IRR  with gr 2/6 systolic murmur RUSB. No significant edema.  On lab evaluation Ecg is ventricular paced. Troponin minimally elevated 0.12 with flat trend. BNP is elevated but lower than in December and at his baseline. He does not appear to be volume overloaded.  Impression:  1. Chest pain. This is quite different than his prior anginal pain. No evidence of ACS by enzymes. He is on metoprolol at low dose. BP is fairly soft and he reports Ntg markedly dropped his BP in the past. I don't think stress testing in this gentleman will be helpful as it will be abnormal. I also don't think his symptoms warrant invasive evaluation especially given risk of contrast induced nephropathy. I doubt his AS is causing chest pain since his symptoms are not exertional. At this point I would continue with his current cardiac care.  2. CAD s/p CABG  3. Chronic systolic CHF. EF 35-40%. Volume status is stable. 4. AS suspect moderate. Doubt critical. He is a poor candidate for TAVR given co-morbidities.  5. Musculoskeletal neck and arm pain. This appears to be his greatest and most limiting issue. Not a candidate for surgery. Cannot take NSAIDs. Will defer to primary team. Consider tramadol vs muscle relaxant.   Raynard Mapps [place], [place] 03/02/2018 2:03 PM

## 2018-03-02 NOTE — Discharge Summary (Signed)
Kenneth Bell, is a 85 y.o. male  DOB 1926/06/25  MRN [id number].  Admission date:  03/01/2018  Admitting Physician  [name], MD  Discharge Date:  03/02/2018   Primary MD  [name], DO  Recommendations for primary care physician for things to follow:  -Hold warfarin tonight, repeat INR in a.m., INR is 4.24 on day of discharge, resume warfarin when appropriate.  Admission Diagnosis  Chest pain, unspecified type [R07.9]   Discharge Diagnosis  Chest pain, unspecified type [R07.9]    Active Problems:   Persistent atrial fibrillation (HCC); CHA2DS2Vasc = ~4 (CHF, MI/aortic plaque, Age 50)   Chronic combined systolic and diastolic heart failure, NYHA class 3 (HCC)   CKD (chronic kidney disease), stage III (HCC)   Coronary artery disease involving coronary bypass graft of native heart with angina pectoris (HCC)   Biventricular ICD (implantable cardioverter-defibrillator) in place   Thyroid disease   Hyperlipidemia with target LDL less than 70   Essential hypertension   COPD (chronic obstructive pulmonary disease) (HCC)   CHF (congestive heart failure) (HCC)   Chest pain      Past Medical History:  Diagnosis Date  . Brain aneurysm   . Chronic combined systolic and diastolic heart failure, NYHA class 3 (HCC)    EF has seemed to vary report-to-report. Myoview 2007 showed EF 30%, echo in 2016 7 EF 60-65% -> Echo in 08/2015 & 08/2016  40-45%  (with basal-mid inferoseptal & apical inferolateral wall), Mod P HTN (PAP ~53 mHg) --most recent in January 2019 says EF 45-50%.  . CKD (chronic kidney disease), stage III ([place]) 08/26/2016  . COPD (chronic obstructive pulmonary disease) ([place])   . Coronary artery disease involving native heart with angina pectoris ([place])    a. MI 2002 - PCI to Combine. b. 2005: PCI to LAD, OM1 & OM2.  c. s/p CABG in 2013: LIMA to LAD, SVG to OM2, SVG to PDA and SVG to RI. ;  d. Cath 2017: occluded LAD, OM1 & OM2, SVG-RI with patent LIMA-LAD, SVG-rPDA & SVG-OM2.   . Essential hypertension   . Hyperlipidemia with target LDL less than 70   . Moderate pulmonary arterial systolic hypertension (HCC) 08/28/2016   PA pressures F has been estimated at 53-59 mmHg on echo-   . Moderate to severe aortic stenosis 08/28/2016   Stenosis with mild regurgitation.  . Persistent atrial fibrillation   . Presence of biventricular implantable cardioverter-defibrillator (ICD) 2008  . Thyroid disease     Past Surgical History:  Procedure Laterality Date  . APPENDECTOMY    . CARDIAC CATHETERIZATION  06/2015   Left main patent. Diffuse LAD disease with competitive flow and distal LAD from LIMA. Mid Cx 35% with occlusion of OM 1 and 90% dCx. OM 2 occluded (perfuse via SVG). Ostial RCA 70%, dRCA 100%.  Patent LIMA-LAD. Widely patent SVG-OM, SVG-PDA: 40% proximal. 100% proximal SVG-RI  . CARDIAC SURGERY    . CARPAL TUNNEL RELEASE Right 2017  . COLONOSCOPY  09/05/2012   Colon Polyps  .  CORONARY ANGIOPLASTY WITH STENT PLACEMENT     Prior PCI to OM 2; 2005 PCI to LAD and OM1 with a Taxus DES 2.5 x 16 mm (postdilated to 3.8 mm) and PTCA of OM 2 stent  . CORONARY ARTERY BYPASS GRAFT  2013   LIMA-LAD, SVG-L1-2, SVG-PDA, SVG-RI (known occlusion of SVG RI)  . KNEE SURGERY Bilateral 1999  . NM MYOVIEW LTD  04/2015   Prior lateral infarct with no scanning. EF 31% with moderate global HK  . PROSTATE SURGERY    . SHOULDER SURGERY Bilateral    Twice  . TRANSTHORACIC ECHOCARDIOGRAM  08/2015   Moderate LV dysfunction with global HK (estimated at 40 and 45%). Worse anterolateral and inferolateral wall.  . TRANSTHORACIC ECHOCARDIOGRAM  08/2016   EF 40-45%. Moderate concentric LVH. Akinesis of basal-mid inferoseptal and apical inferolateral wall.  Aortic sclerosis, no stenosis. Moderate mitral thickening with mild MR. Moderate RV dilation. Moderate TR and PR. PA pressures 53 mmHg  . TRANSTHORACIC  ECHOCARDIOGRAM  02/2017    EF 45-50% (similar to last).  Apical-inferolateral akinesis.  Severe focal basal and moderate concentric LVH.  Mild aortic stenosis.  Elevated PA pressures with peak pressures estimated 59 mmHg (moderate PA hypertension)  . VEIN BYPASS SURGERY  2013       History of present illness and  Hospital Course:     Kindly see H&P for history of present illness and admission details, please review complete Labs, Consult reports and Test reports for all details in brief  HPI  from the history and physical done on the day of admission 03/02/2018  HPI: this is a 85 y/o male with h/o CAD who presents with c/o left sided chest pains. He states he has never had Chest pain even when he had his prior MI. He reports only chest pressure. This pain concerned him and he came to the ER. It lasted approx 30 min. The pain was continuous. He was at rest when the pain started. He denies any SOB or palpitations. His pain is described as dull. at its worse it was 3/10, currently it is 0/10.  He has a h/o CAD and had a CABG  History provided by the patient   Hospital Course    Chest pain -Has nontypical features, cardiology input greatly appreciated, per cardiology this is quite different than his prior anginal pain, rest testing and likely will be helpful as it will be abnormal, as well his symptoms does not warrant invasive evaluation especially given the risk of contrast-induced nephropathy, continue with medical management.  Chronic Systolic CHF -EF 35 to 32%, volume status is stable  Aortic stenosis -management  per cardiology, he is a poor candidate for TAVR given his comorbidities  Chronically elevated troponin -Likely due to his chronic kidney disease.    Troponins trend is flat  Chronic combined systolic and diastolic heart failure, NYHA class 3 (HCC) -Stable, home meds resumed including metoprolol and Zaroxolyn  CKD (chronic kidney disease), stage III (HCC) -Stable,  at baseline  COPD (chronic obstructive pulmonary disease) (HCC) -Stable, home meds resumed  Essential hypertension -Stable, home meds resumed  Hyperlipidemia with target LDL less than 70 -Stable, home meds resumed  Persistent atrial fibrillation (Kenneth Bell); CHA2DS2Vasc = ~4 (CHF, MI/aortic plaque, Age 41) -Stable, home meds resumed -Is 4.32 on discharge, hold evening dose of warfarin, repeat INR in a.m., then resume warfarin when appropriate  Thyroid disease -Stable, home meds resumed  History of seizures -Stable, home meds resumed   Discharge Condition:  stable   Follow UP  Follow-up Information    [name], DO Follow up in 1 week(s).   Specialty:  Family Medicine Contact information: [place] [address] [phone]             Discharge Instructions  and  Discharge Medications    Discharge Instructions    Discharge instructions   Complete by:  As directed    Activity: As tolerated with Full fall precautions use walker/cane & assistance as needed   Disposition ALF   Diet: Heart Healthy  , with feeding assistance and aspiration precautions.  For Heart failure patients - Check your Weight same time everyday, if you gain over 2 pounds, or you develop in leg swelling, experience more shortness of breath or chest pain, call your Primary MD immediately. Follow Cardiac Low Salt Diet and 1.5 lit/day fluid restriction.   On your next visit with your primary care physician please Get Medicines reviewed and adjusted.   Please request your Prim.MD to go over all Hospital Tests and Procedure/Radiological results at the follow up, please get all Hospital records sent to your Prim MD by signing hospital release before you go home.   If you experience worsening of your admission symptoms, develop shortness of breath, life threatening emergency, suicidal or homicidal thoughts you must seek medical attention immediately  by calling 911 or calling your MD immediately  if symptoms less severe.  You Must read complete instructions/literature along with all the possible adverse reactions/side effects for all the Medicines you take and that have been prescribed to you. Take any new Medicines after you have completely understood and accpet all the possible adverse reactions/side effects.   Do not drive, operating heavy machinery, perform activities at heights, swimming or participation in water activities or provide baby sitting services if your were admitted for syncope or siezures until you have seen by Primary MD or a Neurologist and advised to do so again.  Do not drive when taking Pain medications.    Do not take more than prescribed Pain, Sleep and Anxiety Medications  Special Instructions: If you have smoked or chewed Tobacco  in the last 2 yrs please stop smoking, stop any regular Alcohol  and or any Recreational drug use.  Wear Seat belts while driving.   Please note  You were cared for by a hospitalist during your hospital stay. If you have any questions about your discharge medications or the care you received while you were in the hospital after you are discharged, you can call the unit and asked to speak with the hospitalist on call if the hospitalist that took care of you is not available. Once you are discharged, your primary care physician will handle any further medical issues. Please note that NO REFILLS for any discharge medications will be authorized once you are discharged, as it is imperative that you return to your primary care physician (or establish a relationship with a primary care physician if you do not have one) for your aftercare needs so that they can reassess your need for medications and monitor your lab values.     Allergies as of 03/02/2018      Reactions   Lipitor [atorvastatin] Other (See Comments)   unknown   Nitroglycerin Other (See Comments)   DROPS BLOOD PRESSURE  DRASTICALLY Other reaction(s): Other (See Comments) BP drops      Medication List    TAKE these medications   aspirin 81  MG chewable tablet Chew 81 mg by mouth daily.   bumetanide 1 MG tablet Commonly known as:  BUMEX Take 1-2 mg by mouth See admin instructions. Take 2 tablets (2mg ) daily, may take one additional tablet (1mg ) as needed for weight gain. What changed:  Another medication with the same name was removed. Continue taking this medication, and follow the directions you see here.   cyanocobalamin 1000 MCG tablet Take 1 tablet (1,000 mcg total) by mouth daily.   levETIRAcetam 500 MG tablet Commonly known as:  KEPPRA TAKE 1 TABLET BY MOUTH TWICE DAILY   levothyroxine 50 MCG tablet Commonly known as:  SYNTHROID, LEVOTHROID TAKE 1 TABLET BY MOUTH ONCE DAILY What changed:  when to take this   metolazone 2.5 MG tablet Commonly known as:  ZAROXOLYN Take 1 tablet (2.5 mg total) by mouth every 7 (seven) days.   metoprolol succinate 25 MG 24 hr tablet Commonly known as:  TOPROL-XL Take 1 tablet (25 mg total) by mouth daily.   omeprazole 20 MG capsule Commonly known as:  PRILOSEC TAKE 1 CAPSULE BY MOUTH ONCE DAILY   potassium chloride 10 MEQ tablet Commonly known as:  K-DUR Take 10 mEq by mouth daily.   simvastatin 20 MG tablet Commonly known as:  ZOCOR TAKE 1 TABLET BY MOUTH ONCE DAILY IN THE EVENING What changed:  when to take this   warfarin 6 MG tablet Commonly known as:  COUMADIN Take as directed. If you are unsure how to take this medication, talk to your nurse or doctor. Original instructions:  Take 6 mg by mouth See admin instructions. Take one tablet (6mg ) on TUES WED THUR SAT and SUN. What changed:  Another medication with the same name was changed. Make sure you understand how and when to take each.   warfarin 7.5 MG tablet Commonly known as:  COUMADIN Take as directed. If you are unsure how to take this medication, talk to your nurse or  doctor. Original instructions:  Take 1 tab on Mondays and Fridays. - hold Today's dose as of 03/02/2018 What changed:  additional instructions         Diet and Activity recommendation: See Discharge Instructions above   Consults obtained -  cardiology   Major procedures and Radiology Reports - PLEASE review detailed and final reports for all details, in brief -    Dg Chest 2 View  Result Date: 03/01/2018 CLINICAL DATA:  Chest pain EXAM: CHEST - 2 VIEW COMPARISON:  01/23/2018 FINDINGS: Post sternotomy changes. Left-sided pacing device appears stable. No focal consolidation or effusion. Mild cardiomegaly with aortic atherosclerosis. No pneumothorax. Ankylosis of the spine. IMPRESSION: No active cardiopulmonary disease.  Stable cardiomegaly. Electronically Signed   By: Donavan Foil M.D.   On: 03/01/2018 21:46    Micro Results     Recent Results (from the past 240 hour(s))  MRSA PCR Screening     Status: None   Collection Time: 03/02/18  3:27 AM  Result Value Ref Range Status   MRSA by PCR NEGATIVE NEGATIVE Final    Comment:        The GeneXpert MRSA Assay (FDA approved for NASAL specimens only), is one component of a comprehensive MRSA colonization surveillance program. It is not intended to diagnose MRSA infection nor to guide or monitor treatment for MRSA infections. Performed at [facility], [place] [address] [place], [place] [address]        Today   Subjective:   Timtohy Broski today has no  headache,no chest or abdominal pain, he complains neck muscle pain. Objective:   Blood pressure 111/79, pulse (!) 59, temperature 97.6 F (36.4 C), temperature source Oral, resp. rate (!) 22, height 5\' 8"  (1.727 m), weight 88.4 kg, SpO2 99 %.   Intake/Output Summary (Last 24 hours) at 03/02/2018 1519 Last data filed at 03/02/2018 1354 Gross per 24 hour  Intake 340 ml  Output 600 ml  Net -260 ml    Exam Awake Alert, Oriented x 3, No new F.N deficits, Normal  affect Symmetrical Chest wall movement, Good air movement bilaterally, CTAB RRR,No Gallops,+ sys  Murmurs,  +ve B.Sounds, Abd Soft, Non tender, No rebound -guarding or rigidity. No Cyanosis, Clubbing or edema, No new Rash or bruise  Data Review   CBC w Diff:  Lab Results  Component Value Date   WBC 4.1 03/01/2018   HGB 11.1 (L) 03/01/2018   HCT 36.1 (L) 03/01/2018   HCT 36.9 (L) 02/21/2017   PLT 213 03/01/2018   LYMPHOPCT 21 01/23/2018   MONOPCT 12 01/23/2018   EOSPCT 5 01/23/2018   BASOPCT 1 01/23/2018    CMP:  Lab Results  Component Value Date   NA 134 (L) 03/01/2018   NA 141 02/20/2018   K 3.8 03/01/2018   CL 100 03/01/2018   CO2 24 03/01/2018   BUN 54 (H) 03/01/2018   BUN 48 (H) 02/20/2018   CREATININE 2.13 (H) 03/01/2018   PROT 7.1 12/23/2017   ALBUMIN 3.5 12/23/2017   BILITOT 0.8 12/23/2017   ALKPHOS 73 12/23/2017   AST 22 12/23/2017   ALT 14 12/23/2017  .   Total Time in preparing paper work, data evaluation and todays exam - 37 minutes  Phillips Climes M.D on 03/02/2018 at 3:19 PM  [facility]   Office  [phone]

## 2018-03-02 NOTE — Progress Notes (Signed)
Derrill Kay given and went over discharge instructions. Patient in room awaiting ride.   Vitals:   03/02/18 0455 03/02/18 1301  BP: 106/67 111/79  Pulse: (!) 56 (!) 59  Resp: 20 (!) 22  Temp: 97.6 F (36.4 C) 97.6 F (36.4 C)  SpO2: 98% 99%     Julieanne Cotton, RN

## 2018-03-02 NOTE — Progress Notes (Signed)
Troponin 0.13, no s/s, INR 4.24, MD notified, will continue to monitor, Thanks Arvella Nigh RN.

## 2018-03-02 NOTE — ED Notes (Signed)
[facility] at bedside 

## 2018-03-02 NOTE — NC FL2 (Addendum)
[place] LEVEL OF CARE SCREENING TOOL     IDENTIFICATION  Patient Name: Kenneth Bell Birthdate: 1926-10-30 Sex: male Admission Date (Current Location): 03/01/2018  [facility] and [place] Number:  Herbalist and Address:  [facility] [place], [place] [address], [place], [place] [address]      Provider Number: [id number]  Attending Physician Name and Address:  Elgergawy, Silver Huguenin, MD  Relative Name and Phone Number:       Current Level of Care: Hospital Recommended Level of Care: [place] Prior Approval Number:    Date Approved/Denied:   PASRR Number:    Discharge Plan: Other (Comment)(ALF)    Current Diagnoses: Patient Active Problem List   Diagnosis Date Noted  . Chest pain 03/01/2018  . CHF (congestive heart failure) ([place]) 01/25/2018  . Acute respiratory failure with hypoxemia ([place]) 01/24/2018  . Acute on chronic systolic heart failure ([place]) 01/23/2018  . Pain in both forearms 01/14/2018  . Cerebral thrombosis with cerebral infarction 02/21/2017  . Cerebral embolism with cerebral infarction 02/21/2017  . Subarachnoid hemorrhage 02/21/2017  . Confusion 02/20/2017  . Brain aneurysm   . Thyroid disease   . Hyperlipidemia with target LDL less than 70   . Essential hypertension   . Coronary artery disease involving native heart with angina pectoris ([place])   . COPD (chronic obstructive pulmonary disease) ([place])   . Aortic stenosis, mild 09/13/2016  . Ischemic cardiomyopathy 08/28/2016  . Moderate pulmonary arterial systolic hypertension ([place]) 08/28/2016  . Mild aortic insufficiency 08/28/2016  . Elevated troponin   . Chronic combined systolic and diastolic heart failure, NYHA class 3 ([place]) 08/26/2016  . CKD (chronic kidney disease), stage III ([place]) 08/26/2016  . Persistent atrial fibrillation ([place]); CHA2DS2Vasc = ~4 (CHF, MI/aortic plaque, Age 71) 08/21/2016  . Biventricular ICD (implantable  cardioverter-defibrillator) in place 09/13/2012  . Coronary artery disease involving coronary bypass graft of native heart with angina pectoris ([place]) 08/26/2012  . Presence of biventricular implantable cardioverter-defibrillator (ICD) 02/11/2006    Orientation RESPIRATION BLADDER Height & Weight     Self, Time, Situation, Place  Room air Continent Weight: 194 lb 14.2 oz (88.4 kg) Height:  5\' 8"  (172.7 cm)  BEHAVIORAL SYMPTOMS/MOOD NEUROLOGICAL BOWEL NUTRITION STATUS      Continent Diet(heart healthy/carb modified)  AMBULATORY STATUS COMMUNICATION OF NEEDS Skin   Limited Assist Verbally Normal                       Personal Care Assistance Level of Assistance  Dressing, Feeding, Bathing Bathing Assistance: Limited assistance Feeding assistance: Independent Dressing Assistance: Limited assistance     Functional Limitations Info  Sight, Hearing, Speech Sight Info: Adequate Hearing Info: Adequate Speech Info: Adequate    SPECIAL CARE FACTORS FREQUENCY  PT (By licensed PT), OT (By licensed OT)                    Contractures Contractures Info: Not present    Additional Factors Info  Code Status, Allergies Code Status Info: Full Code Allergies Info: Lipitor Atorvastatin, Nitroglycerin           Current Medications (03/02/2018):  This is the current hospital active medication list Current Facility-Administered Medications  Medication Dose Route Frequency Provider Last Rate Last Dose  . acetaminophen (TYLENOL) tablet 650 mg  650 mg Oral Q4H PRN Crosley, Debby, MD      . aspirin tablet 325 mg  325 mg Oral Daily Crosley,  Debby, MD   325 mg at 03/02/18 0942  . bumetanide (BUMEX) tablet 2 mg  2 mg Oral Daily [name], MD   2 mg at 03/02/18 0943  . levETIRAcetam (KEPPRA) tablet 500 mg  500 mg Oral BID [name], MD   500 mg at 03/02/18 0948  . levothyroxine (SYNTHROID, LEVOTHROID) tablet 50 mcg  50 mcg Oral Daily [name], MD   50 mcg at 03/02/18 0942   . metolazone (ZAROXOLYN) tablet 2.5 mg  2.5 mg Oral Q7 days Crosley, Debby, MD      . metoprolol succinate (TOPROL-XL) 24 hr tablet 25 mg  25 mg Oral Daily Crosley, Debby, MD   25 mg at 03/02/18 0942  . nitroGLYCERIN (NITROSTAT) SL tablet 0.4 mg  0.4 mg Sublingual Q5 min PRN Crosley, Debby, MD      . ondansetron (ZOFRAN) injection 4 mg  4 mg Intravenous Q6H PRN Crosley, Debby, MD      . pantoprazole (PROTONIX) EC tablet 40 mg  40 mg Oral Daily Crosley, Debby, MD   40 mg at 03/02/18 0943  . simvastatin (ZOCOR) tablet 20 mg  20 mg Oral QPM Crosley, Debby, MD      . sodium chloride flush (NS) 0.9 % injection 3 mL  3 mL Intravenous Once Malvin Johns, MD      . vitamin B-12 (CYANOCOBALAMIN) tablet 1,000 mcg  1,000 mcg Oral Daily Claria Dice, Debby, MD   1,000 mcg at 03/02/18 0942  . Warfarin - Pharmacist Dosing Inpatient   Does not apply [id number] [name], MD         Discharge Medications: Please see discharge summary for a list of discharge medications.  Relevant Imaging Results:  Relevant Lab Results:   Additional Information SS#: [id number]  Eileen Stanford, LCSW

## 2018-03-03 ENCOUNTER — Telehealth: Payer: Self-pay | Admitting: Cardiovascular Disease

## 2018-03-03 NOTE — Telephone Encounter (Signed)
Spoke to patient-- he states he has difficult coming to office for an appointment for defib check /office with Dr Sallyanne Kuster he states he would would need a long lead time to find transportation. patien has an appointment with Dr Ellyn Hack  In April 05/18/18. - Dr  Sallyanne Kuster is not in the office that day.  1- can we schedule same day, with both or 2 -can he just see Dr Ellyn Hack 3- can he still just have remote checks until needed Patient aware will defer to Dr Sallyanne Kuster

## 2018-03-03 NOTE — Telephone Encounter (Signed)
New Message:    I called pt to make appt for Dr C for his pacer check.. Pt wanted to know if he could have it checked at [facility]?.

## 2018-03-04 ENCOUNTER — Telehealth: Payer: Self-pay | Admitting: Cardiology

## 2018-03-04 ENCOUNTER — Telehealth: Payer: Self-pay | Admitting: *Deleted

## 2018-03-04 NOTE — Telephone Encounter (Signed)
New Message:    Patient daughter calling about some medications that was suppose to changed. Patient daughter states that her father is not feeling good and he just got out of the hospital 2 days ago.

## 2018-03-04 NOTE — Telephone Encounter (Signed)
Transition Care Management Follow-up Telephone Call   Date discharged? 03/02/2018   How have you been since you were released from the hospital? "I'm doing ok"   Do you understand why you were in the hospital? yes   Do you understand the discharge instructions? yes   Where were you discharged to? Back to [facility]   Items Reviewed:  Medications reviewed: yes  Allergies reviewed:yes  Dietary changes reviewed:yes  Referrals reviewed:yes   Functional Questionnaire:   Activities of Daily Living (ADLs):   He states they are independent in the following: ambulation, bathing and hygiene, feeding, continence, grooming, toileting and dressing States they require assistance with the following: n/a   Any transportation issues/concerns?: yes. Pt states he will have to arrange transportation either with the ALF or his daughter. He will call back to scheduled appointment   Any patient concerns? no  Confirmed with patient if condition begins to worsen call PCP or go to the ER.  Patient was given the office number and encouraged to call back with question or concerns.  : yes

## 2018-03-04 NOTE — Telephone Encounter (Signed)
I understand that he has difficulty and can keep monitoring remotely. But at least once a year he needs a manual device check. If we can arrange for the rep to check his device at the April appt w Dr. Ellyn Hack, I can look at it after that.

## 2018-03-04 NOTE — Telephone Encounter (Signed)
Spoke to patient. He is aware may have device check at April 2020 appointment with Dr Ellyn Hack and the device information will be given to Dr Sallyanne Kuster to review and comment. Patient aware he does need an annual appointment for manual device check.

## 2018-03-05 ENCOUNTER — Other Ambulatory Visit: Payer: Self-pay | Admitting: Family Medicine

## 2018-03-05 NOTE — Telephone Encounter (Signed)
LEFT MESSAGE TO CALL BACK

## 2018-03-06 ENCOUNTER — Ambulatory Visit: Payer: Self-pay

## 2018-03-06 NOTE — Telephone Encounter (Signed)
Pt daughter called to say her father has been suffering with back and shoulder pain. He has just been hospitalized for this pain to R/O heart issues. Per daughter the hospital doctor suggested that the pain could be caused by his statin. No change to his medication was noted on the discharge report.  Daughter says the her father is treating his pain with heat and extra strengh tylenol. She was wanting advice for options for pain medication.  She was advised about the max dose for tylenol in 24 hours and urged to speak with pharmacy for other pain relief options. Daughter verbalized understanding of all instructions.  Pt scheduled OV for hospital F/U on Wednesday 03/11/2018.  Answer Assessment - Initial Assessment Questions 1. SYMPTOMS: "Do you have any symptoms?"     Shoulder and back pain 2. SEVERITY: If symptoms are present, ask "Are they mild, moderate or severe?"     Severe, was just released form hospital.  Protocols used: MEDICATION QUESTION CALL-A-AH

## 2018-03-07 DIAGNOSIS — I2721 Secondary pulmonary arterial hypertension: Secondary | ICD-10-CM | POA: Diagnosis not present

## 2018-03-07 DIAGNOSIS — I4819 Other persistent atrial fibrillation: Secondary | ICD-10-CM | POA: Diagnosis not present

## 2018-03-07 DIAGNOSIS — Z87891 Personal history of nicotine dependence: Secondary | ICD-10-CM | POA: Diagnosis not present

## 2018-03-07 DIAGNOSIS — Z9181 History of falling: Secondary | ICD-10-CM | POA: Diagnosis not present

## 2018-03-07 DIAGNOSIS — J449 Chronic obstructive pulmonary disease, unspecified: Secondary | ICD-10-CM | POA: Diagnosis not present

## 2018-03-07 DIAGNOSIS — Z7901 Long term (current) use of anticoagulants: Secondary | ICD-10-CM | POA: Diagnosis not present

## 2018-03-07 DIAGNOSIS — I351 Nonrheumatic aortic (valve) insufficiency: Secondary | ICD-10-CM | POA: Diagnosis not present

## 2018-03-07 DIAGNOSIS — Z95810 Presence of automatic (implantable) cardiac defibrillator: Secondary | ICD-10-CM | POA: Diagnosis not present

## 2018-03-07 DIAGNOSIS — M79621 Pain in right upper arm: Secondary | ICD-10-CM | POA: Diagnosis not present

## 2018-03-07 DIAGNOSIS — G4089 Other seizures: Secondary | ICD-10-CM | POA: Diagnosis not present

## 2018-03-07 DIAGNOSIS — M545 Low back pain: Secondary | ICD-10-CM | POA: Diagnosis not present

## 2018-03-07 DIAGNOSIS — M79622 Pain in left upper arm: Secondary | ICD-10-CM | POA: Diagnosis not present

## 2018-03-07 DIAGNOSIS — N183 Chronic kidney disease, stage 3 (moderate): Secondary | ICD-10-CM | POA: Diagnosis not present

## 2018-03-07 DIAGNOSIS — E039 Hypothyroidism, unspecified: Secondary | ICD-10-CM | POA: Diagnosis not present

## 2018-03-07 DIAGNOSIS — Z951 Presence of aortocoronary bypass graft: Secondary | ICD-10-CM | POA: Diagnosis not present

## 2018-03-07 DIAGNOSIS — I130 Hypertensive heart and chronic kidney disease with heart failure and stage 1 through stage 4 chronic kidney disease, or unspecified chronic kidney disease: Secondary | ICD-10-CM | POA: Diagnosis not present

## 2018-03-07 DIAGNOSIS — Z7982 Long term (current) use of aspirin: Secondary | ICD-10-CM | POA: Diagnosis not present

## 2018-03-07 DIAGNOSIS — I25111 Atherosclerotic heart disease of native coronary artery with angina pectoris with documented spasm: Secondary | ICD-10-CM | POA: Diagnosis not present

## 2018-03-07 DIAGNOSIS — I5043 Acute on chronic combined systolic (congestive) and diastolic (congestive) heart failure: Secondary | ICD-10-CM | POA: Diagnosis not present

## 2018-03-10 DIAGNOSIS — M545 Low back pain: Secondary | ICD-10-CM | POA: Diagnosis not present

## 2018-03-10 DIAGNOSIS — I130 Hypertensive heart and chronic kidney disease with heart failure and stage 1 through stage 4 chronic kidney disease, or unspecified chronic kidney disease: Secondary | ICD-10-CM | POA: Diagnosis not present

## 2018-03-10 DIAGNOSIS — I25111 Atherosclerotic heart disease of native coronary artery with angina pectoris with documented spasm: Secondary | ICD-10-CM | POA: Diagnosis not present

## 2018-03-10 DIAGNOSIS — M79621 Pain in right upper arm: Secondary | ICD-10-CM | POA: Diagnosis not present

## 2018-03-10 DIAGNOSIS — N183 Chronic kidney disease, stage 3 (moderate): Secondary | ICD-10-CM | POA: Diagnosis not present

## 2018-03-10 DIAGNOSIS — I5043 Acute on chronic combined systolic (congestive) and diastolic (congestive) heart failure: Secondary | ICD-10-CM | POA: Diagnosis not present

## 2018-03-10 NOTE — Telephone Encounter (Signed)
Spoke with pt dtr, the patient continues to have quit a bit of pain in his back, arm and neck. Sharpe pain that lasts 15-30 mins. He has an appointment with his medical doctor Thursday this week. She was questioning about the simvastatin and they tought in the hosp they were going to decrease the dose to see if that was causing some of the pain. She did not see that a change was made. Explained the patient can stop the simvastatin until seen this week to see if it makes a difference. They will talk with the medical doctor at appointment. Questions regatrding medications and kidney function answered.

## 2018-03-11 ENCOUNTER — Inpatient Hospital Stay: Payer: Medicare Other | Admitting: Family Medicine

## 2018-03-11 ENCOUNTER — Ambulatory Visit: Payer: Medicare Other | Admitting: Family Medicine

## 2018-03-11 DIAGNOSIS — N183 Chronic kidney disease, stage 3 (moderate): Secondary | ICD-10-CM | POA: Diagnosis not present

## 2018-03-11 DIAGNOSIS — I5043 Acute on chronic combined systolic (congestive) and diastolic (congestive) heart failure: Secondary | ICD-10-CM | POA: Diagnosis not present

## 2018-03-11 DIAGNOSIS — I25111 Atherosclerotic heart disease of native coronary artery with angina pectoris with documented spasm: Secondary | ICD-10-CM | POA: Diagnosis not present

## 2018-03-11 DIAGNOSIS — I130 Hypertensive heart and chronic kidney disease with heart failure and stage 1 through stage 4 chronic kidney disease, or unspecified chronic kidney disease: Secondary | ICD-10-CM | POA: Diagnosis not present

## 2018-03-11 DIAGNOSIS — M545 Low back pain: Secondary | ICD-10-CM | POA: Diagnosis not present

## 2018-03-11 DIAGNOSIS — M79621 Pain in right upper arm: Secondary | ICD-10-CM | POA: Diagnosis not present

## 2018-03-12 ENCOUNTER — Encounter: Payer: Self-pay | Admitting: Family Medicine

## 2018-03-12 ENCOUNTER — Ambulatory Visit (INDEPENDENT_AMBULATORY_CARE_PROVIDER_SITE_OTHER): Payer: Medicare Other | Admitting: Family Medicine

## 2018-03-12 VITALS — BP 110/68 | HR 61 | Temp 97.4°F | Ht 68.0 in | Wt 196.1 lb

## 2018-03-12 DIAGNOSIS — M79609 Pain in unspecified limb: Secondary | ICD-10-CM

## 2018-03-12 DIAGNOSIS — Z7901 Long term (current) use of anticoagulants: Secondary | ICD-10-CM | POA: Diagnosis not present

## 2018-03-12 DIAGNOSIS — R079 Chest pain, unspecified: Secondary | ICD-10-CM | POA: Diagnosis not present

## 2018-03-12 DIAGNOSIS — I4819 Other persistent atrial fibrillation: Secondary | ICD-10-CM

## 2018-03-12 LAB — POCT INR: INR: 2.9 (ref 2.0–3.0)

## 2018-03-12 MED ORDER — POTASSIUM CHLORIDE ER 10 MEQ PO TBCR: 10.0000 meq | EXTENDED_RELEASE_TABLET | Freq: Every day | ORAL | 1 refills | Status: DC

## 2018-03-12 MED ORDER — CYANOCOBALAMIN 1000 MCG PO TABS: 1000.0000 ug | ORAL_TABLET | Freq: Every day | ORAL | 1 refills | Status: AC

## 2018-03-12 NOTE — Patient Instructions (Addendum)
Continue current medications and the stretches/exercises. For now continue your statin. If you don't continue to improvement, let me know and we can discuss coming off of the statin for a few weeks.  Wear your hearing aids when you go to doctor's appointments.  No changes with your warfarin.   Let us know if you need anything.

## 2018-03-12 NOTE — Progress Notes (Signed)
Chief Complaint  Patient presents with  . Hospitalization Follow-up    HPI Kenneth Bell is a 85 y.o. y.o. male who presents for a transition of care visit.  Pt was discharged from [facility] on 03/02/2018 Within 48 business hours of discharge our office contacted pt via telephone to coordinate care and needs.   Had been having intermittent and sharp pains in extremities, on the date of admission he had some chest pain. Certain movements cause the pain. He was given stretches/exercises by PT at his assisted living facility that has seemed to help over the last week. The workup was unremarkable in the hosp.   Past Medical History:  Diagnosis Date  . Brain aneurysm   . Chronic combined systolic and diastolic heart failure, NYHA class 3 (HCC)    EF has seemed to vary report-to-report. Myoview 2007 showed EF 30%, echo in 2016 7 EF 60-65% -> Echo in 08/2015 & 08/2016  40-45%  (with basal-mid inferoseptal & apical inferolateral wall), Mod P HTN (PAP ~53 mHg) --most recent in January 2019 says EF 45-50%.  . CKD (chronic kidney disease), stage III ([place]) 08/26/2016  . COPD (chronic obstructive pulmonary disease) ([place])   . Coronary artery disease involving native heart with angina pectoris ([place])    a. MI 2002 - PCI to [place]. b. 2005: PCI to LAD, OM1 & OM2.  c. s/p CABG in 2013: LIMA to LAD, SVG to OM2, SVG to PDA and SVG to RI. ; d. Cath 2017: occluded LAD, OM1 & OM2, SVG-RI with patent LIMA-LAD, SVG-rPDA & SVG-OM2.   . Essential hypertension   . Hyperlipidemia with target LDL less than 70   . Moderate pulmonary arterial systolic hypertension (HCC) 08/28/2016   PA pressures F has been estimated at 53-59 mmHg on echo-   . Moderate to severe aortic stenosis 08/28/2016   Stenosis with mild regurgitation.  . Persistent atrial fibrillation   . Presence of biventricular implantable cardioverter-defibrillator (ICD) 2008  . Thyroid disease    @SURG @ Family History  Problem Relation Age of Onset  . Arthritis Mother    . Heart disease Father    Allergies as of 03/12/2018      Reactions   Lipitor [atorvastatin] Other (See Comments)   unknown   Nitroglycerin Other (See Comments)   DROPS BLOOD PRESSURE DRASTICALLY Other reaction(s): Other (See Comments) BP drops      Medication List       Accurate as of March 12, 2018 12:00 PM. Always use your most recent med list.        aspirin 81 MG chewable tablet Chew 81 mg by mouth daily.   bumetanide 1 MG tablet Commonly known as:  BUMEX Take 1-2 mg by mouth See admin instructions. Take 2 tablets (2mg ) daily, may take one additional tablet (1mg ) as needed for weight gain.   cyanocobalamin 1000 MCG tablet Take 1 tablet (1,000 mcg total) by mouth daily.   levETIRAcetam 500 MG tablet Commonly known as:  KEPPRA TAKE 1 TABLET BY MOUTH TWICE DAILY   levothyroxine 50 MCG tablet Commonly known as:  SYNTHROID, LEVOTHROID TAKE 1 TABLET BY MOUTH ONCE DAILY   metolazone 2.5 MG tablet Commonly known as:  ZAROXOLYN Take 1 tablet (2.5 mg total) by mouth every 7 (seven) days.   metoprolol succinate 25 MG 24 hr tablet Commonly known as:  TOPROL-XL Take 1 tablet (25 mg total) by mouth daily.   omeprazole 20 MG capsule Commonly known as:  PRILOSEC TAKE 1 CAPSULE BY MOUTH  ONCE DAILY   potassium chloride 10 MEQ tablet Commonly known as:  K-DUR Take 1 tablet (10 mEq total) by mouth daily.   simvastatin 20 MG tablet Commonly known as:  ZOCOR TAKE 1 TABLET BY MOUTH ONCE DAILY IN THE EVENING   warfarin 6 MG tablet Commonly known as:  COUMADIN Take as directed by the anticoagulation clinic. If you are unsure how to take this medication, talk to your nurse or doctor. Original instructions:  Take 6 mg by mouth See admin instructions. Take one tablet (6mg ) on TUES WED THUR SAT and SUN.   warfarin 7.5 MG tablet Commonly known as:  COUMADIN Take as directed by the anticoagulation clinic. If you are unsure how to take this medication, talk to your nurse or  doctor. Original instructions:  Take 1 tab on Mondays and Fridays. - hold Today's dose as of 03/02/2018       ROS:  Constitutional: no weight loss HEENT: No headaches, hearing loss, or runny nose, no sore throat Heart: No chest pain Lungs: +DOE Abd: No bowel changes, no pain, no N/V GU: No urinary complaints Neuro: No numbness, tingling or weakness Msk: +intermittent ext pain  Objective BP 110/68 (BP Location: Left Arm, Patient Position: Sitting, Cuff Size: Normal)   Pulse 61   Temp (!) 97.4 F (36.3 C) (Oral)   Ht 5\' 8"  (1.727 m)   Wt 196 lb 2 oz (89 kg)   SpO2 96%   BMI 29.82 kg/m  General Appearance:  awake, alert, oriented, in no acute distress and well developed, well nourished Skin:  there are no suspicious lesions or rashes of concern Head/face:  NCAT Eyes:  EOMI, PERRLA Ears:  canals and TMs neg, HOH Nose/Sinuses:  negative Mouth/Throat:  Mucosa moist, no lesions; pharynx without erythema, edema or exudate. Neck:  neck- supple, no mass, non-tender and no jvd Lungs: Clear to auscultation.  No rales, rhonchi, or wheezing. Normal effort, no accessory muscle use. Heart:  Heart sounds are normal.  Regular rate and rhythm w SEM, no gallop or rub. No bruits. Abdomen:  BS+, soft, NT, ND, no masses or organomegaly Musculoskeletal:  No muscle group atrophy or asymmetry, gait normal Neurologic:  Alert and oriented x 3, gait slow, no cerebellar signs Psych exam: Nml mood and affect, age appropriate judgment and insight  Chest pain, unspecified type  Pain in extremity, unspecified extremity  Persistent atrial fibrillation ([place]); CHA2DS2Vasc = ~4 (CHF, MI/aortic plaque)  Long term current use of anticoagulant - Plan: POCT INR  Discharge summary and medication list have been reviewed/reconciled.  Labs pending at the time of discharge have been reviewed or are still pending at the time of this visit.  Follow-up labs and appointments have been ordered and/or coordinated  appropriately. Chest pain has resolved.  Intermittent sharp pains has improved, cont stretching/exercises. If no improvement, to let us know after 1 mo and will consider statin drug holiday INR has stabilized at 2.9. Cont current dose of warfarin .  TRANSITIONAL CARE MANAGEMENT CERTIFICATION:  I certify the following are true:   1. Communication with the patient/care giver was made within 2 business days of discharge.  2. Complexity of Medical decision making is moderate.  3. Face to face visit occurred within 14 days of discharge.   F/u in 6 mo for med ck. The patient voiced understanding and agreement to the plan.  [place], DO 03/12/18 12:00 PM

## 2018-03-13 DIAGNOSIS — M79621 Pain in right upper arm: Secondary | ICD-10-CM | POA: Diagnosis not present

## 2018-03-13 DIAGNOSIS — I5043 Acute on chronic combined systolic (congestive) and diastolic (congestive) heart failure: Secondary | ICD-10-CM | POA: Diagnosis not present

## 2018-03-13 DIAGNOSIS — I130 Hypertensive heart and chronic kidney disease with heart failure and stage 1 through stage 4 chronic kidney disease, or unspecified chronic kidney disease: Secondary | ICD-10-CM | POA: Diagnosis not present

## 2018-03-13 DIAGNOSIS — N183 Chronic kidney disease, stage 3 (moderate): Secondary | ICD-10-CM | POA: Diagnosis not present

## 2018-03-13 DIAGNOSIS — I25111 Atherosclerotic heart disease of native coronary artery with angina pectoris with documented spasm: Secondary | ICD-10-CM | POA: Diagnosis not present

## 2018-03-13 DIAGNOSIS — M545 Low back pain: Secondary | ICD-10-CM | POA: Diagnosis not present

## 2018-03-17 DIAGNOSIS — M545 Low back pain: Secondary | ICD-10-CM | POA: Diagnosis not present

## 2018-03-17 DIAGNOSIS — N183 Chronic kidney disease, stage 3 (moderate): Secondary | ICD-10-CM | POA: Diagnosis not present

## 2018-03-17 DIAGNOSIS — M79621 Pain in right upper arm: Secondary | ICD-10-CM | POA: Diagnosis not present

## 2018-03-17 DIAGNOSIS — I5043 Acute on chronic combined systolic (congestive) and diastolic (congestive) heart failure: Secondary | ICD-10-CM | POA: Diagnosis not present

## 2018-03-17 DIAGNOSIS — I130 Hypertensive heart and chronic kidney disease with heart failure and stage 1 through stage 4 chronic kidney disease, or unspecified chronic kidney disease: Secondary | ICD-10-CM | POA: Diagnosis not present

## 2018-03-17 DIAGNOSIS — I25111 Atherosclerotic heart disease of native coronary artery with angina pectoris with documented spasm: Secondary | ICD-10-CM | POA: Diagnosis not present

## 2018-03-17 MED ORDER — METOPROLOL SUCCINATE ER 25 MG PO TB24: 37.5000 mg | ORAL_TABLET | Freq: Every day | ORAL | 3 refills | Status: DC

## 2018-03-17 NOTE — Telephone Encounter (Signed)
Discussed with Dr Sallyanne Kuster - still recommends increasing metoprolol succinate to 37.5 mg daily.  New Rx(s) sent to pharmacy electronically.

## 2018-03-17 NOTE — Addendum Note (Signed)
Addended by: Diana Eves on: 03/17/2018 12:45 PM   Modules accepted: Orders

## 2018-03-17 NOTE — Addendum Note (Signed)
Addended by: Diana Eves on: 03/17/2018 04:44 PM   Modules accepted: Orders

## 2018-03-17 NOTE — Telephone Encounter (Signed)
Recommendation reviewed with patient's daughter. She will communicate this to the patient and asked that I call patient tomorrow to confirm understanding.

## 2018-03-17 NOTE — Telephone Encounter (Signed)
Mechele Dawley, RN  Taima Rada, Dionne Bucy, CMA        Hi [place],   Dr. Loletha Grayer recommended increasing Mr. Stauffer metoprolol succinate to 37.5mg  daily (per result note), but somehow the patient ended up with metoprolol tartrate and had been taking this once daily instead. Looks like (per the phone note) Lars Mage clarified these orders with Dr. Ellyn Hack, but I'm not sure if Dr. Loletha Grayer still wants to increase the patient's metoprolol. Just wanted to put it on your radar for when you return.   Please let me know if you need me to do anything!  Thanks,  Raquel Sarna

## 2018-03-18 ENCOUNTER — Encounter (HOSPITAL_BASED_OUTPATIENT_CLINIC_OR_DEPARTMENT_OTHER): Payer: Self-pay | Admitting: *Deleted

## 2018-03-18 ENCOUNTER — Emergency Department (HOSPITAL_BASED_OUTPATIENT_CLINIC_OR_DEPARTMENT_OTHER)
Admission: EM | Admit: 2018-03-18 | Discharge: 2018-03-18 | Disposition: A | Discharge status: Home / Self Care | Payer: Medicare Other | Attending: Emergency Medicine | Admitting: Emergency Medicine

## 2018-03-18 ENCOUNTER — Emergency Department (HOSPITAL_BASED_OUTPATIENT_CLINIC_OR_DEPARTMENT_OTHER): Payer: Medicare Other

## 2018-03-18 ENCOUNTER — Other Ambulatory Visit: Payer: Self-pay

## 2018-03-18 DIAGNOSIS — Z951 Presence of aortocoronary bypass graft: Secondary | ICD-10-CM | POA: Diagnosis not present

## 2018-03-18 DIAGNOSIS — Z7901 Long term (current) use of anticoagulants: Secondary | ICD-10-CM | POA: Insufficient documentation

## 2018-03-18 DIAGNOSIS — M545 Low back pain: Secondary | ICD-10-CM | POA: Insufficient documentation

## 2018-03-18 DIAGNOSIS — J449 Chronic obstructive pulmonary disease, unspecified: Secondary | ICD-10-CM | POA: Diagnosis not present

## 2018-03-18 DIAGNOSIS — Z743 Need for continuous supervision: Secondary | ICD-10-CM | POA: Diagnosis not present

## 2018-03-18 DIAGNOSIS — Z7982 Long term (current) use of aspirin: Secondary | ICD-10-CM | POA: Insufficient documentation

## 2018-03-18 DIAGNOSIS — M546 Pain in thoracic spine: Secondary | ICD-10-CM | POA: Insufficient documentation

## 2018-03-18 DIAGNOSIS — I130 Hypertensive heart and chronic kidney disease with heart failure and stage 1 through stage 4 chronic kidney disease, or unspecified chronic kidney disease: Secondary | ICD-10-CM | POA: Insufficient documentation

## 2018-03-18 DIAGNOSIS — I2510 Atherosclerotic heart disease of native coronary artery without angina pectoris: Secondary | ICD-10-CM | POA: Diagnosis not present

## 2018-03-18 DIAGNOSIS — R079 Chest pain, unspecified: Secondary | ICD-10-CM | POA: Diagnosis not present

## 2018-03-18 DIAGNOSIS — Z79899 Other long term (current) drug therapy: Secondary | ICD-10-CM | POA: Diagnosis not present

## 2018-03-18 DIAGNOSIS — Z87891 Personal history of nicotine dependence: Secondary | ICD-10-CM | POA: Diagnosis not present

## 2018-03-18 DIAGNOSIS — N183 Chronic kidney disease, stage 3 (moderate): Secondary | ICD-10-CM | POA: Insufficient documentation

## 2018-03-18 DIAGNOSIS — I499 Cardiac arrhythmia, unspecified: Secondary | ICD-10-CM | POA: Diagnosis not present

## 2018-03-18 DIAGNOSIS — R279 Unspecified lack of coordination: Secondary | ICD-10-CM | POA: Diagnosis not present

## 2018-03-18 DIAGNOSIS — R0789 Other chest pain: Secondary | ICD-10-CM | POA: Diagnosis not present

## 2018-03-18 DIAGNOSIS — M549 Dorsalgia, unspecified: Secondary | ICD-10-CM

## 2018-03-18 DIAGNOSIS — I5042 Chronic combined systolic (congestive) and diastolic (congestive) heart failure: Secondary | ICD-10-CM | POA: Insufficient documentation

## 2018-03-18 DIAGNOSIS — R4182 Altered mental status, unspecified: Secondary | ICD-10-CM | POA: Diagnosis not present

## 2018-03-18 DIAGNOSIS — M5489 Other dorsalgia: Secondary | ICD-10-CM | POA: Diagnosis not present

## 2018-03-18 MED ORDER — TRAMADOL HCL 50 MG PO TABS
50.0000 mg | ORAL_TABLET | Freq: Once | ORAL | Status: AC
  Administered 2018-03-18: 50 mg via ORAL
  Filled 2018-03-18: qty 1

## 2018-03-18 MED ORDER — TRAMADOL HCL 50 MG PO TABS: 50.0000 mg | ORAL_TABLET | Freq: Two times a day (BID) | ORAL | 0 refills | Status: AC | PRN

## 2018-03-18 NOTE — ED Triage Notes (Addendum)
Pt brought in from [place] by EMS  for mid back pain with increased pain with movt  HX " pinched nerve" denies fall

## 2018-03-18 NOTE — ED Provider Notes (Signed)
[facility] EMERGENCY DEPARTMENT Provider Note   CSN: [id number] Arrival date & time: 03/18/18  1615     History   Chief Complaint Chief Complaint  Patient presents with  . Back Pain    HPI Kenneth Bell is a 85 y.o. male.  85 year old male with extensive past medical history below including atrial fibrillation on Coumadin, CHF, CKD, COPD, CAD who presents with back pain.  Over the past 2 weeks, the patient has had intermittent episodes of back pain that he states are sudden in onset, sharp, and migratory, sometimes involving his upper back and sometimes his lower back.  He has intermittently had a milder pain down his left leg which she had a few days ago and responded well to heat therapy.  He does not have any leg pain currently.  He denies any problems walking, ambulates at baseline with a walker.  He denies any associated numbness.  No bowel or bladder problems.  No hematuria.  He denies any recent falls, fevers, or change in physical activity.  He denies any history of cancer. He has tried tylenol occasionally, no medications today prior to arrival. No h/o cancer.  The history is provided by the patient.  Back Pain    Past Medical History:  Diagnosis Date  . Brain aneurysm   . Chronic combined systolic and diastolic heart failure, NYHA class 3 (HCC)    EF has seemed to vary report-to-report. Myoview 2007 showed EF 30%, echo in 2016 7 EF 60-65% -> Echo in 08/2015 & 08/2016  40-45%  (with basal-mid inferoseptal & apical inferolateral wall), Mod P HTN (PAP ~53 mHg) --most recent in January 2019 says EF 45-50%.  . CKD (chronic kidney disease), stage III ([place]) 08/26/2016  . COPD (chronic obstructive pulmonary disease) ([place])   . Coronary artery disease involving native heart with angina pectoris ([place])    a. MI 2002 - PCI to [place]. b. 2005: PCI to LAD, OM1 & OM2.  c. s/p CABG in 2013: LIMA to LAD, SVG to OM2, SVG to PDA and SVG to RI. ; d. Cath 2017: occluded LAD, OM1 & OM2,  SVG-RI with patent LIMA-LAD, SVG-rPDA & SVG-OM2.   . Essential hypertension   . Hyperlipidemia with target LDL less than 70   . Moderate pulmonary arterial systolic hypertension (HCC) 08/28/2016   PA pressures F has been estimated at 53-59 mmHg on echo-   . Moderate to severe aortic stenosis 08/28/2016   Stenosis with mild regurgitation.  . Persistent atrial fibrillation   . Presence of biventricular implantable cardioverter-defibrillator (ICD) 2008  . Thyroid disease     Patient Active Problem List   Diagnosis Date Noted  . Chest pain 03/01/2018  . CHF (congestive heart failure) ([place]) 01/25/2018  . Acute respiratory failure with hypoxemia ([place]) 01/24/2018  . Acute on chronic systolic heart failure ([place]) 01/23/2018  . Pain in both forearms 01/14/2018  . Cerebral thrombosis with cerebral infarction 02/21/2017  . Cerebral embolism with cerebral infarction 02/21/2017  . Subarachnoid hemorrhage 02/21/2017  . Confusion 02/20/2017  . Brain aneurysm   . Thyroid disease   . Hyperlipidemia with target LDL less than 70   . Essential hypertension   . Coronary artery disease involving native heart with angina pectoris ([place])   . COPD (chronic obstructive pulmonary disease) ([place])   . Aortic stenosis, mild 09/13/2016  . Ischemic cardiomyopathy 08/28/2016  . Moderate pulmonary arterial systolic hypertension ([place]) 08/28/2016  . Mild aortic insufficiency 08/28/2016  . Elevated troponin   .  Chronic combined systolic and diastolic heart failure, NYHA class 3 ([place]) 08/26/2016  . CKD (chronic kidney disease), stage III ([place]) 08/26/2016  . Persistent atrial fibrillation ([place]); CHA2DS2Vasc = ~4 (CHF, MI/aortic plaque, Age 60) 08/21/2016  . Biventricular ICD (implantable cardioverter-defibrillator) in place 09/13/2012  . Coronary artery disease involving coronary bypass graft of native heart with angina pectoris ([place]) 08/26/2012  . Presence of biventricular implantable cardioverter-defibrillator (ICD)  02/11/2006    Past Surgical History:  Procedure Laterality Date  . APPENDECTOMY    . CARDIAC CATHETERIZATION  06/2015   Left main patent. Diffuse LAD disease with competitive flow and distal LAD from LIMA. Mid Cx 35% with occlusion of OM 1 and 90% dCx. OM 2 occluded (perfuse via SVG). Ostial RCA 70%, dRCA 100%.  Patent LIMA-LAD. Widely patent SVG-OM, SVG-PDA: 40% proximal. 100% proximal SVG-RI  . CARDIAC SURGERY    . CARPAL TUNNEL RELEASE Right 2017  . COLONOSCOPY  09/05/2012   Colon Polyps  . CORONARY ANGIOPLASTY WITH STENT PLACEMENT     Prior PCI to OM 2; 2005 PCI to LAD and OM1 with a Taxus DES 2.5 x 16 mm (postdilated to 3.8 mm) and PTCA of OM 2 stent  . CORONARY ARTERY BYPASS GRAFT  2013   LIMA-LAD, SVG-L1-2, SVG-PDA, SVG-RI (known occlusion of SVG RI)  . KNEE SURGERY Bilateral 1999  . NM MYOVIEW LTD  04/2015   Prior lateral infarct with no scanning. EF 31% with moderate global HK  . PROSTATE SURGERY    . SHOULDER SURGERY Bilateral    Twice  . TRANSTHORACIC ECHOCARDIOGRAM  08/2015   Moderate LV dysfunction with global HK (estimated at 40 and 45%). Worse anterolateral and inferolateral wall.  . TRANSTHORACIC ECHOCARDIOGRAM  08/2016   EF 40-45%. Moderate concentric LVH. Akinesis of basal-mid inferoseptal and apical inferolateral wall.  Aortic sclerosis, no stenosis. Moderate mitral thickening with mild MR. Moderate RV dilation. Moderate TR and PR. PA pressures 53 mmHg  . TRANSTHORACIC ECHOCARDIOGRAM  02/2017    EF 45-50% (similar to last).  Apical-inferolateral akinesis.  Severe focal basal and moderate concentric LVH.  Mild aortic stenosis.  Elevated PA pressures with peak pressures estimated 59 mmHg (moderate PA hypertension)  . VEIN BYPASS SURGERY  2013        Home Medications    Prior to Admission medications   Medication Sig Start Date End Date Taking? Authorizing Provider  aspirin 81 MG chewable tablet Chew 81 mg by mouth daily.    [provider]  bumetanide  (BUMEX) 1 MG tablet Take 1-2 mg by mouth See admin instructions. Take 2 tablets (2mg ) daily, may take one additional tablet (1mg ) as needed for weight gain. 02/15/18   [provider]  cyanocobalamin 1000 MCG tablet Take 1 tablet (1,000 mcg total) by mouth daily. 03/12/18   [name], DO  levETIRAcetam (KEPPRA) 500 MG tablet TAKE 1 TABLET BY MOUTH TWICE DAILY 03/05/18   Wendling, Crosby Oyster, DO  levothyroxine (SYNTHROID, LEVOTHROID) 50 MCG tablet TAKE 1 TABLET BY MOUTH ONCE DAILY Patient taking differently: Take 50 mcg by mouth daily before breakfast.  02/19/18   Wendling, Crosby Oyster, DO  metolazone (ZAROXOLYN) 2.5 MG tablet Take 1 tablet (2.5 mg total) by mouth every 7 (seven) days. 02/02/18   Kayleen Memos, DO  metoprolol succinate (TOPROL-XL) 25 MG 24 hr tablet Take 1.5 tablets (37.5 mg total) by mouth daily. 03/17/18   Croitoru, Mihai, MD  omeprazole (PRILOSEC) 20 MG capsule TAKE 1 CAPSULE BY MOUTH ONCE  DAILY Patient taking differently: Take 20 mg by mouth daily.  08/04/17   [name], DO  potassium chloride (K-DUR) 10 MEQ tablet Take 1 tablet (10 mEq total) by mouth daily. 03/12/18   [name], DO  simvastatin (ZOCOR) 20 MG tablet TAKE 1 TABLET BY MOUTH ONCE DAILY IN THE EVENING Patient taking differently: Take 20 mg by mouth daily at 6 PM.  02/12/18   Wendling, Crosby Oyster, DO  traMADol (ULTRAM) 50 MG tablet Take 1 tablet (50 mg total) by mouth every 12 (twelve) hours as needed for up to 5 days for severe pain. 03/18/18 03/23/18  , Wenda Overland, MD  warfarin (COUMADIN) 6 MG tablet Take 6 mg by mouth See admin instructions. Take one tablet (6mg ) on TUES WED THUR SAT and SUN. 02/02/18   [name], DO  warfarin (COUMADIN) 7.5 MG tablet Take 1 tab on Mondays and Fridays. - hold Today's dose as of 03/02/2018 03/02/18   Elgergawy, Silver Huguenin, MD    Family History Family History  Problem Relation Age of Onset  . Arthritis Mother   .  Heart disease Father     Social History Social History   Tobacco Use  . Smoking status: Former Smoker    Types: Cigarettes    Last attempt to quit: 05/30/1967    Years since quitting: 50.8  . Smokeless tobacco: Never Used  Substance Use Topics  . Alcohol use: Yes    Alcohol/week: 6.0 standard drinks    Types: 6 Glasses of wine per week    Comment: weekly  . Drug use: No     Allergies   Lipitor [atorvastatin] and Nitroglycerin   Review of Systems Review of Systems  Musculoskeletal: Positive for back pain.   All other systems reviewed and are negative except that which was mentioned in HPI   Physical Exam Updated Vital Signs BP 134/85 (BP Location: Left Arm)   Pulse 68   Temp 98.2 F (36.8 C) (Oral)   Resp 18   Ht 5\' 10"  (1.778 m)   Wt 82.6 kg   SpO2 96%   BMI 26.11 kg/m   Physical Exam Vitals signs and nursing note reviewed.  Constitutional:      General: He is not in acute distress.    Appearance: He is well-developed.  HENT:     Head: Normocephalic and atraumatic.     Mouth/Throat:     Mouth: Mucous membranes are moist.     Pharynx: Oropharynx is clear.  Eyes:     Conjunctiva/sclera: Conjunctivae normal.     Pupils: Pupils are equal, round, and reactive to light.  Neck:     Musculoskeletal: Neck supple.  Musculoskeletal:     Comments: Mild BLE edema No midline spinal tenderness  Skin:    General: Skin is warm and dry.  Neurological:     Mental Status: He is alert and oriented to person, place, and time.     Sensory: No sensory deficit.     Motor: No weakness.     Comments: Fluent speech  Psychiatric:        Judgment: Judgment normal.      ED Treatments / Results  Labs (all labs ordered are listed, but only abnormal results are displayed) Labs Reviewed - No data to display  EKG None  Radiology Dg Thoracic Spine 2 View  Result Date: 03/18/2018 CLINICAL DATA:  Mid back pain with movement. No known injury. EXAM: THORACIC SPINE 2 VIEWS  COMPARISON:  Chest  radiographs dated 03/01/2018. Chest CT dated 01/22/2018. FINDINGS: Mild dextroconvex thoracic scoliosis. 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 No fracture or subluxation seen. IMPRESSION: No acute abnormality. Stable changes of ankylosing spondylitis. Electronically Signed   By: Claudie Revering M.D.   On: 03/18/2018 19:18   Dg Lumbar Spine Complete  Result Date: 03/18/2018 CLINICAL DATA:  Mid and lower back pain with movement for the past 2 weeks. No known injury. EXAM: LUMBAR SPINE - COMPLETE 4+ VIEW COMPARISON:  Thoracic spine radiographs obtained at the same time. FINDINGS: Five non-rib-bearing lumbar vertebrae. Mild to moderate right lateral spur formation at the L3-4 level. Diffuse osteopenia. No fractures, pars defects or subluxations are seen. Extensive atheromatous arterial calcifications. 7 mm rounded calcification in the right mid abdomen, most likely in the right kidney. IMPRESSION: 1. No acute abnormality. 2. Mild to moderate right lateral spondylosis at the L3-4 level. 3. 7 mm probable right renal calculus. Electronically Signed   By: Claudie Revering M.D.   On: 03/18/2018 19:20    Procedures Procedures (including critical care time)  Medications Ordered in ED Medications  traMADol (ULTRAM) tablet 50 mg (50 mg Oral Given 03/18/18 2121)     Initial Impression / Assessment and Plan / ED Course  I have reviewed the triage vital signs and the nursing notes.  Pertinent labs & imaging results that were available during my care of the patient were reviewed by me and considered in my medical decision making (see chart for details).      Because of advanced age and risk for osteoporotic fractures and cancer, obtained XR which show degenerative changes and AS but no acute findings.  He is unable to take NSAIDs due to CKD and coumadin use, provided w/ Tramadol. Cautioned on potential side effects and explained that he  should take Tylenol on a schedule primarily and tramadol only if he has severe pain episodes.  Extensively reviewed return precautions and he voiced understanding. Discussed work up, treatment and f/u plan with daughter over the phone.  Final Clinical Impressions(s) / ED Diagnoses   Final diagnoses:  Bilateral back pain, unspecified back location, unspecified chronicity    ED Discharge Orders         Ordered    traMADol (ULTRAM) 50 MG tablet  Every 12 hours PRN     03/18/18 1938           , Wenda Overland, MD 03/19/18 1346

## 2018-03-18 NOTE — Telephone Encounter (Signed)
Medication change communicated with patient. He verbalized understanding and agreed with plan.

## 2018-03-18 NOTE — ED Notes (Signed)
[facility] called for transport @ 2016.

## 2018-03-18 NOTE — ED Notes (Signed)
PT states understanding of care given, follow up care, and medication prescribed. Pt going back by [facility] .

## 2018-03-19 DIAGNOSIS — I5043 Acute on chronic combined systolic (congestive) and diastolic (congestive) heart failure: Secondary | ICD-10-CM | POA: Diagnosis not present

## 2018-03-19 DIAGNOSIS — I25111 Atherosclerotic heart disease of native coronary artery with angina pectoris with documented spasm: Secondary | ICD-10-CM | POA: Diagnosis not present

## 2018-03-19 DIAGNOSIS — M79621 Pain in right upper arm: Secondary | ICD-10-CM | POA: Diagnosis not present

## 2018-03-19 DIAGNOSIS — M545 Low back pain: Secondary | ICD-10-CM | POA: Diagnosis not present

## 2018-03-19 DIAGNOSIS — I130 Hypertensive heart and chronic kidney disease with heart failure and stage 1 through stage 4 chronic kidney disease, or unspecified chronic kidney disease: Secondary | ICD-10-CM | POA: Diagnosis not present

## 2018-03-19 DIAGNOSIS — N183 Chronic kidney disease, stage 3 (moderate): Secondary | ICD-10-CM | POA: Diagnosis not present

## 2018-03-23 DIAGNOSIS — M9903 Segmental and somatic dysfunction of lumbar region: Secondary | ICD-10-CM | POA: Diagnosis not present

## 2018-03-23 DIAGNOSIS — M5414 Radiculopathy, thoracic region: Secondary | ICD-10-CM | POA: Diagnosis not present

## 2018-03-23 DIAGNOSIS — M9902 Segmental and somatic dysfunction of thoracic region: Secondary | ICD-10-CM | POA: Diagnosis not present

## 2018-03-23 DIAGNOSIS — M9901 Segmental and somatic dysfunction of cervical region: Secondary | ICD-10-CM | POA: Diagnosis not present

## 2018-03-23 DIAGNOSIS — M531 Cervicobrachial syndrome: Secondary | ICD-10-CM | POA: Diagnosis not present

## 2018-03-23 DIAGNOSIS — M5136 Other intervertebral disc degeneration, lumbar region: Secondary | ICD-10-CM | POA: Diagnosis not present

## 2018-03-24 ENCOUNTER — Telehealth: Payer: Self-pay | Admitting: *Deleted

## 2018-03-24 DIAGNOSIS — I25111 Atherosclerotic heart disease of native coronary artery with angina pectoris with documented spasm: Secondary | ICD-10-CM | POA: Diagnosis not present

## 2018-03-24 DIAGNOSIS — M545 Low back pain: Secondary | ICD-10-CM | POA: Diagnosis not present

## 2018-03-24 DIAGNOSIS — M79621 Pain in right upper arm: Secondary | ICD-10-CM | POA: Diagnosis not present

## 2018-03-24 DIAGNOSIS — I5043 Acute on chronic combined systolic (congestive) and diastolic (congestive) heart failure: Secondary | ICD-10-CM | POA: Diagnosis not present

## 2018-03-24 DIAGNOSIS — N183 Chronic kidney disease, stage 3 (moderate): Secondary | ICD-10-CM | POA: Diagnosis not present

## 2018-03-24 DIAGNOSIS — I130 Hypertensive heart and chronic kidney disease with heart failure and stage 1 through stage 4 chronic kidney disease, or unspecified chronic kidney disease: Secondary | ICD-10-CM | POA: Diagnosis not present

## 2018-03-24 NOTE — Telephone Encounter (Signed)
Received Physician Orders from [facility]; forwarded to provider/SLS 02/11

## 2018-03-25 DIAGNOSIS — M9903 Segmental and somatic dysfunction of lumbar region: Secondary | ICD-10-CM | POA: Diagnosis not present

## 2018-03-25 DIAGNOSIS — M5414 Radiculopathy, thoracic region: Secondary | ICD-10-CM | POA: Diagnosis not present

## 2018-03-25 DIAGNOSIS — M9901 Segmental and somatic dysfunction of cervical region: Secondary | ICD-10-CM | POA: Diagnosis not present

## 2018-03-25 DIAGNOSIS — M9902 Segmental and somatic dysfunction of thoracic region: Secondary | ICD-10-CM | POA: Diagnosis not present

## 2018-03-25 DIAGNOSIS — M531 Cervicobrachial syndrome: Secondary | ICD-10-CM | POA: Diagnosis not present

## 2018-03-25 DIAGNOSIS — M5136 Other intervertebral disc degeneration, lumbar region: Secondary | ICD-10-CM | POA: Diagnosis not present

## 2018-03-26 DIAGNOSIS — M545 Low back pain: Secondary | ICD-10-CM | POA: Diagnosis not present

## 2018-03-26 DIAGNOSIS — I5043 Acute on chronic combined systolic (congestive) and diastolic (congestive) heart failure: Secondary | ICD-10-CM | POA: Diagnosis not present

## 2018-03-26 DIAGNOSIS — M79621 Pain in right upper arm: Secondary | ICD-10-CM | POA: Diagnosis not present

## 2018-03-26 DIAGNOSIS — N183 Chronic kidney disease, stage 3 (moderate): Secondary | ICD-10-CM | POA: Diagnosis not present

## 2018-03-26 DIAGNOSIS — I25111 Atherosclerotic heart disease of native coronary artery with angina pectoris with documented spasm: Secondary | ICD-10-CM | POA: Diagnosis not present

## 2018-03-26 DIAGNOSIS — I130 Hypertensive heart and chronic kidney disease with heart failure and stage 1 through stage 4 chronic kidney disease, or unspecified chronic kidney disease: Secondary | ICD-10-CM | POA: Diagnosis not present

## 2018-03-27 DIAGNOSIS — M9903 Segmental and somatic dysfunction of lumbar region: Secondary | ICD-10-CM | POA: Diagnosis not present

## 2018-03-27 DIAGNOSIS — M5136 Other intervertebral disc degeneration, lumbar region: Secondary | ICD-10-CM | POA: Diagnosis not present

## 2018-03-27 DIAGNOSIS — M9901 Segmental and somatic dysfunction of cervical region: Secondary | ICD-10-CM | POA: Diagnosis not present

## 2018-03-27 DIAGNOSIS — M531 Cervicobrachial syndrome: Secondary | ICD-10-CM | POA: Diagnosis not present

## 2018-03-27 DIAGNOSIS — M9902 Segmental and somatic dysfunction of thoracic region: Secondary | ICD-10-CM | POA: Diagnosis not present

## 2018-03-27 DIAGNOSIS — M5414 Radiculopathy, thoracic region: Secondary | ICD-10-CM | POA: Diagnosis not present

## 2018-03-30 ENCOUNTER — Ambulatory Visit (HOSPITAL_BASED_OUTPATIENT_CLINIC_OR_DEPARTMENT_OTHER)
Admission: RE | Admit: 2018-03-30 | Discharge: 2018-03-30 | Disposition: A | Discharge status: Home / Self Care | Payer: Medicare Other | Source: Ambulatory Visit | Attending: Medical | Admitting: Medical

## 2018-03-30 ENCOUNTER — Ambulatory Visit (INDEPENDENT_AMBULATORY_CARE_PROVIDER_SITE_OTHER): Payer: Medicare Other | Admitting: Medical

## 2018-03-30 ENCOUNTER — Encounter: Payer: Self-pay | Admitting: Medical

## 2018-03-30 VITALS — BP 130/80 | HR 59 | Resp 18 | Wt 181.0 lb

## 2018-03-30 DIAGNOSIS — M5414 Radiculopathy, thoracic region: Secondary | ICD-10-CM | POA: Diagnosis not present

## 2018-03-30 DIAGNOSIS — M545 Low back pain, unspecified: Secondary | ICD-10-CM

## 2018-03-30 DIAGNOSIS — M792 Neuralgia and neuritis, unspecified: Secondary | ICD-10-CM | POA: Insufficient documentation

## 2018-03-30 DIAGNOSIS — M9901 Segmental and somatic dysfunction of cervical region: Secondary | ICD-10-CM | POA: Diagnosis not present

## 2018-03-30 DIAGNOSIS — I255 Ischemic cardiomyopathy: Secondary | ICD-10-CM | POA: Diagnosis not present

## 2018-03-30 DIAGNOSIS — M5136 Other intervertebral disc degeneration, lumbar region: Secondary | ICD-10-CM | POA: Diagnosis not present

## 2018-03-30 DIAGNOSIS — M546 Pain in thoracic spine: Secondary | ICD-10-CM

## 2018-03-30 DIAGNOSIS — M9902 Segmental and somatic dysfunction of thoracic region: Secondary | ICD-10-CM | POA: Diagnosis not present

## 2018-03-30 DIAGNOSIS — M25511 Pain in right shoulder: Secondary | ICD-10-CM

## 2018-03-30 DIAGNOSIS — M4802 Spinal stenosis, cervical region: Secondary | ICD-10-CM | POA: Diagnosis not present

## 2018-03-30 DIAGNOSIS — M542 Cervicalgia: Secondary | ICD-10-CM

## 2018-03-30 DIAGNOSIS — R739 Hyperglycemia, unspecified: Secondary | ICD-10-CM | POA: Diagnosis not present

## 2018-03-30 DIAGNOSIS — M531 Cervicobrachial syndrome: Secondary | ICD-10-CM | POA: Diagnosis not present

## 2018-03-30 DIAGNOSIS — M9903 Segmental and somatic dysfunction of lumbar region: Secondary | ICD-10-CM | POA: Diagnosis not present

## 2018-03-30 MED ORDER — TIZANIDINE HCL 4 MG PO CAPS: ORAL_CAPSULE | ORAL | 0 refills | Status: DC

## 2018-03-30 MED ORDER — PREDNISONE 10 MG PO TABS: ORAL_TABLET | ORAL | 0 refills | Status: DC

## 2018-03-30 NOTE — Patient Instructions (Addendum)
Your pain location presently on exam appears to be in the muscle adjacent to thoracic and lumbar spine junction.  I think it is worth trying 5-day low-dose taper prednisone.  Also you can use low-dose Zanaflex 4 mg before you sleep.  Rx advisement explained in detail.  Caution on gaining balance before ambulating.  If he have to get up at night to use the bathroom but feel dizzy/unsteady then use call button to get help ambulating.  Recommend stopping tramadol.  Can continue with Tylenol.  No NSAIDs due to low kidney function.  Please get x-ray of neck today.  Also think is a good idea to get right shoulder x-ray as you might rotator cuff type findings on x-ray.  Considering that you might benefit from low-dose gabapentin at night but do not want to get this presently as it might cause excess sedation if you are using muscle relaxant.  Follow-up in 7 days or as needed.

## 2018-03-30 NOTE — Progress Notes (Signed)
Subjective:    Patient ID: Kenneth Bell, male    DOB: 06-10-1926, 85 y.o.   MRN: [id number]  HPI  Pt in with some pain in back for about 3-4 weeks.   Pt pain worse just recently.   Pt has been to ED on 03-18-2018.  xrays done. Also rx'd tramadol. Instruction was to take 1 every 12 hours. He states it did not help very much at all. Told to take 2 tylenol as well but he forgot.  Pt has evaluation at [place]. Ortho told him really could not do much for him. Pt has seen chiropracter 3 times with chiropracter with no improvement.  Pt states hardly any relief in pain. He states lower back/left side is worse area of pain(but on exam pain is higher). Pt state only twice did he have brief pain in legs.   Pt states pain comes and goes with position change.   Some neck pain on rt side and some rt side neck pain mildly.  Xray of thoracic spine shows FINDINGS: Mild dextroconvex thoracic scoliosis. 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 No fracture or subluxation seen.  IMPRESSION: No acute abnormality. Stable changes of ankylosing spondylitis.  Lumbar spine xray shows.  IMPRESSION: 1. No acute abnormality. 2. Mild to moderate right lateral spondylosis at the L3-4 level. 3. 7 mm probable right renal calculus.   Review of Systems  Constitutional: Negative for chills, fatigue and fever.  Respiratory: Negative for cough, chest tightness, shortness of breath and wheezing.   Cardiovascular: Negative for chest pain and palpitations.  Gastrointestinal: Negative for abdominal pain.  Genitourinary: Negative for dysuria, frequency and urgency.  Musculoskeletal: Positive for back pain and neck pain.       See hpi.  Skin: Negative for color change and rash.  Neurological: Negative for dizziness, light-headedness and headaches.  Hematological: Negative for adenopathy. Does not bruise/bleed easily.    Psychiatric/Behavioral: Negative for behavioral problems and hallucinations. The patient is not nervous/anxious.     Past Medical History:  Diagnosis Date  . Brain aneurysm   . Chronic combined systolic and diastolic heart failure, NYHA class 3 (HCC)    EF has seemed to vary report-to-report. Myoview 2007 showed EF 30%, echo in 2016 7 EF 60-65% -> Echo in 08/2015 & 08/2016  40-45%  (with basal-mid inferoseptal & apical inferolateral wall), Mod P HTN (PAP ~53 mHg) --most recent in January 2019 says EF 45-50%.  . CKD (chronic kidney disease), stage III ([place]) 08/26/2016  . COPD (chronic obstructive pulmonary disease) ([place])   . Coronary artery disease involving native heart with angina pectoris ([place])    a. MI 2002 - PCI to [place]. b. 2005: PCI to LAD, OM1 & OM2.  c. s/p CABG in 2013: LIMA to LAD, SVG to OM2, SVG to PDA and SVG to RI. ; d. Cath 2017: occluded LAD, OM1 & OM2, SVG-RI with patent LIMA-LAD, SVG-rPDA & SVG-OM2.   . Essential hypertension   . Hyperlipidemia with target LDL less than 70   . Moderate pulmonary arterial systolic hypertension (HCC) 08/28/2016   PA pressures F has been estimated at 53-59 mmHg on echo-   . Moderate to severe aortic stenosis 08/28/2016   Stenosis with mild regurgitation.  . Persistent atrial fibrillation   . Presence of biventricular implantable cardioverter-defibrillator (ICD) 2008  . Thyroid disease      Social History   Socioeconomic History  . Marital status: Married    Spouse name:  Not on file  . Number of children: Not on file  . Years of education: Not on file  . Highest education level: Not on file  Occupational History  . Not on file  Social Needs  . Financial resource strain: Not on file  . Food insecurity:    Worry: Not on file    Inability: Not on file  . Transportation needs:    Medical: Not on file    Non-medical: Not on file  Tobacco Use  . Smoking status: Former Smoker    Types: Cigarettes    Last attempt to quit: 05/30/1967     Years since quitting: 50.8  . Smokeless tobacco: Never Used  Substance and Sexual Activity  . Alcohol use: Yes    Alcohol/week: 6.0 standard drinks    Types: 6 Glasses of wine per week    Comment: weekly  . Drug use: No  . Sexual activity: Not on file  Lifestyle  . Physical activity:    Days per week: Not on file    Minutes per session: Not on file  . Stress: Not on file  Relationships  . Social connections:    Talks on phone: Not on file    Gets together: Not on file    Attends religious service: Not on file    Active member of club or organization: Not on file    Attends meetings of clubs or organizations: Not on file    Relationship status: Not on file  . Intimate partner violence:    Fear of current or ex partner: Not on file    Emotionally abused: Not on file    Physically abused: Not on file    Forced sexual activity: Not on file  Other Topics Concern  . Not on file  Social History Narrative   He has been married for 82 years. They have 5 children and 10 grandchildren. 12 great-grandchildren.   He recently moved to [facility], [place] and oriented close to his daughter here. His wife has been moved into an Alzheimer's/memory unit nearby.   He currently lives with his daughter.   He has a college degree and previously worked as a self-employed Materials engineer.   He himself quit driving couple years ago because he was concerned about his reaction time. He is therefore depend upon his family or public transportation.   He quit smoking in 1969 having smoked for 20 years.   He drinks maybe 4-7 drinks alcohol/wine a week.   He does not exercise any longer because of shortness of breath and lack of energy.    Past Surgical History:  Procedure Laterality Date  . APPENDECTOMY    . CARDIAC CATHETERIZATION  06/2015   Left main patent. Diffuse LAD disease with competitive flow and distal LAD from LIMA. Mid Cx 35% with occlusion of OM 1 and 90% dCx. OM 2 occluded (perfuse via  SVG). Ostial RCA 70%, dRCA 100%.  Patent LIMA-LAD. Widely patent SVG-OM, SVG-PDA: 40% proximal. 100% proximal SVG-RI  . CARDIAC SURGERY    . CARPAL TUNNEL RELEASE Right 2017  . COLONOSCOPY  09/05/2012   Colon Polyps  . CORONARY ANGIOPLASTY WITH STENT PLACEMENT     Prior PCI to OM 2; 2005 PCI to LAD and OM1 with a Taxus DES 2.5 x 16 mm (postdilated to 3.8 mm) and PTCA of OM 2 stent  . CORONARY ARTERY BYPASS GRAFT  2013   LIMA-LAD, SVG-L1-2, SVG-PDA, SVG-RI (known occlusion of SVG RI)  . KNEE  SURGERY Bilateral 1999  . NM MYOVIEW LTD  04/2015   Prior lateral infarct with no scanning. EF 31% with moderate global HK  . PROSTATE SURGERY    . SHOULDER SURGERY Bilateral    Twice  . TRANSTHORACIC ECHOCARDIOGRAM  08/2015   Moderate LV dysfunction with global HK (estimated at 40 and 45%). Worse anterolateral and inferolateral wall.  . TRANSTHORACIC ECHOCARDIOGRAM  08/2016   EF 40-45%. Moderate concentric LVH. Akinesis of basal-mid inferoseptal and apical inferolateral wall.  Aortic sclerosis, no stenosis. Moderate mitral thickening with mild MR. Moderate RV dilation. Moderate TR and PR. PA pressures 53 mmHg  . TRANSTHORACIC ECHOCARDIOGRAM  02/2017    EF 45-50% (similar to last).  Apical-inferolateral akinesis.  Severe focal basal and moderate concentric LVH.  Mild aortic stenosis.  Elevated PA pressures with peak pressures estimated 59 mmHg (moderate PA hypertension)  . VEIN BYPASS SURGERY  2013    Family History  Problem Relation Age of Onset  . Arthritis Mother   . Heart disease Father     Allergies  Allergen Reactions  . Lipitor [Atorvastatin] Other (See Comments)    unknown  . Nitroglycerin Other (See Comments)    DROPS BLOOD PRESSURE DRASTICALLY Other reaction(s): Other (See Comments) BP drops    Current Outpatient Medications on File Prior to Visit  Medication Sig Dispense Refill  . aspirin 81 MG chewable tablet Chew 81 mg by mouth daily.    . bumetanide (BUMEX) 1 MG tablet Take  1-2 mg by mouth See admin instructions. Take 2 tablets (2mg ) daily, may take one additional tablet (1mg ) as needed for weight gain.    . cyanocobalamin 1000 MCG tablet Take 1 tablet (1,000 mcg total) by mouth daily. 90 tablet 1  . levETIRAcetam (KEPPRA) 500 MG tablet TAKE 1 TABLET BY MOUTH TWICE DAILY 120 tablet 0  . levothyroxine (SYNTHROID, LEVOTHROID) 50 MCG tablet TAKE 1 TABLET BY MOUTH ONCE DAILY (Patient taking differently: Take 50 mcg by mouth daily before breakfast. ) 90 tablet 0  . metolazone (ZAROXOLYN) 2.5 MG tablet Take 1 tablet (2.5 mg total) by mouth every 7 (seven) days. 30 tablet 0  . metoprolol succinate (TOPROL-XL) 25 MG 24 hr tablet Take 1.5 tablets (37.5 mg total) by mouth daily. 135 tablet 3  . omeprazole (PRILOSEC) 20 MG capsule TAKE 1 CAPSULE BY MOUTH ONCE DAILY (Patient taking differently: Take 20 mg by mouth daily. ) 90 capsule 2  . potassium chloride (K-DUR) 10 MEQ tablet Take 1 tablet (10 mEq total) by mouth daily. 90 tablet 1  . simvastatin (ZOCOR) 20 MG tablet TAKE 1 TABLET BY MOUTH ONCE DAILY IN THE EVENING (Patient taking differently: Take 20 mg by mouth daily at 6 PM. ) 90 tablet 0  . warfarin (COUMADIN) 6 MG tablet Take 6 mg by mouth See admin instructions. Take one tablet (6mg ) on TUES WED THUR SAT and SUN. 90 tablet 1  . warfarin (COUMADIN) 7.5 MG tablet Take 1 tab on Mondays and Fridays. - hold Today's dose as of 03/02/2018 90 tablet 1   No current facility-administered medications on file prior to visit.     BP 130/80 (BP Location: Left Arm, Patient Position: Sitting, Cuff Size: Normal)   Pulse (!) 59   Resp 18   Wt 181 lb (82.1 kg)   SpO2 98%   BMI 25.97 kg/m        Objective:   Physical Exam  General- No acute distress. Pleasant patient. Neck- Full range of motion, no jvd  Lungs- Clear, even and unlabored. Heart- regular rate and rhythm. Neurologic- CNII- XII grossly intact.  Thoracic- lower area. parathoracic area pain/junction of lumbar  area. No cva tenderness  Neck- rt side mild trapezius tenderness. Rt upper- faint tender rt upper ext humerus area faint tender. Rt shoulder pain-mild pain on rom. Limited abduction.  Skin- on isnpsection of area left thoracic region no vesicles seen.      Assessment & Plan:  Your pain location presently on exam appears to be in the muscle adjacent to thoracic and lumbar spine junction.  I think it is worth trying 5-day low-dose taper prednisone.  Also you can use low-dose Zanaflex 4 mg before you sleep.  Rx advisement explained in detail.  Caution on gaining balance before ambulating.  If he have to get up at night to use the bathroom but feel dizzy/unsteady then use call button to get help ambulating.  Recommend stopping tramadol.  Can continue with Tylenol.  No NSAIDs due to low kidney function.  Please get x-ray of neck today.  Also think is a good idea to get right shoulder x-ray as you might rotator cuff type findings on x-ray.  Considering that you might benefit from low-dose gabapentin at night but do not want to get this presently as it might cause excess sedation if you are using muscle relaxant.  Follow-up in 7 days or as needed.  40 minutes spent with patient today.  50% of time spent counseling regarding approach to handling difficult to control pain despite various office visits and ED evaluation.  Explained to both patient and his daughter medication and will use in order to help him with pain.  Explained mechanism of pain in light of his age, x-ray findings and history of scoliosis.  Medication Rx advised and given and explained in light of his age and potential side effects.

## 2018-03-31 DIAGNOSIS — I5043 Acute on chronic combined systolic (congestive) and diastolic (congestive) heart failure: Secondary | ICD-10-CM | POA: Diagnosis not present

## 2018-03-31 DIAGNOSIS — M545 Low back pain: Secondary | ICD-10-CM | POA: Diagnosis not present

## 2018-03-31 DIAGNOSIS — M79621 Pain in right upper arm: Secondary | ICD-10-CM | POA: Diagnosis not present

## 2018-03-31 DIAGNOSIS — I25111 Atherosclerotic heart disease of native coronary artery with angina pectoris with documented spasm: Secondary | ICD-10-CM | POA: Diagnosis not present

## 2018-03-31 DIAGNOSIS — N183 Chronic kidney disease, stage 3 (moderate): Secondary | ICD-10-CM | POA: Diagnosis not present

## 2018-03-31 DIAGNOSIS — I130 Hypertensive heart and chronic kidney disease with heart failure and stage 1 through stage 4 chronic kidney disease, or unspecified chronic kidney disease: Secondary | ICD-10-CM | POA: Diagnosis not present

## 2018-04-01 DIAGNOSIS — M5414 Radiculopathy, thoracic region: Secondary | ICD-10-CM | POA: Diagnosis not present

## 2018-04-01 DIAGNOSIS — M531 Cervicobrachial syndrome: Secondary | ICD-10-CM | POA: Diagnosis not present

## 2018-04-01 DIAGNOSIS — M9902 Segmental and somatic dysfunction of thoracic region: Secondary | ICD-10-CM | POA: Diagnosis not present

## 2018-04-01 DIAGNOSIS — M9903 Segmental and somatic dysfunction of lumbar region: Secondary | ICD-10-CM | POA: Diagnosis not present

## 2018-04-01 DIAGNOSIS — M5136 Other intervertebral disc degeneration, lumbar region: Secondary | ICD-10-CM | POA: Diagnosis not present

## 2018-04-01 DIAGNOSIS — M9901 Segmental and somatic dysfunction of cervical region: Secondary | ICD-10-CM | POA: Diagnosis not present

## 2018-04-02 DIAGNOSIS — M79621 Pain in right upper arm: Secondary | ICD-10-CM | POA: Diagnosis not present

## 2018-04-02 DIAGNOSIS — I25111 Atherosclerotic heart disease of native coronary artery with angina pectoris with documented spasm: Secondary | ICD-10-CM | POA: Diagnosis not present

## 2018-04-02 DIAGNOSIS — M545 Low back pain: Secondary | ICD-10-CM | POA: Diagnosis not present

## 2018-04-02 DIAGNOSIS — I130 Hypertensive heart and chronic kidney disease with heart failure and stage 1 through stage 4 chronic kidney disease, or unspecified chronic kidney disease: Secondary | ICD-10-CM | POA: Diagnosis not present

## 2018-04-02 DIAGNOSIS — N183 Chronic kidney disease, stage 3 (moderate): Secondary | ICD-10-CM | POA: Diagnosis not present

## 2018-04-02 DIAGNOSIS — I5043 Acute on chronic combined systolic (congestive) and diastolic (congestive) heart failure: Secondary | ICD-10-CM | POA: Diagnosis not present

## 2018-04-06 ENCOUNTER — Encounter: Payer: Self-pay | Admitting: Family Medicine

## 2018-04-06 ENCOUNTER — Telehealth: Payer: Self-pay | Admitting: Family Medicine

## 2018-04-06 ENCOUNTER — Ambulatory Visit (INDEPENDENT_AMBULATORY_CARE_PROVIDER_SITE_OTHER): Payer: Medicare Other | Admitting: Family Medicine

## 2018-04-06 VITALS — BP 128/82 | HR 59 | Temp 97.4°F | Ht 68.0 in | Wt 197.0 lb

## 2018-04-06 DIAGNOSIS — M5136 Other intervertebral disc degeneration, lumbar region: Secondary | ICD-10-CM | POA: Diagnosis not present

## 2018-04-06 DIAGNOSIS — M5414 Radiculopathy, thoracic region: Secondary | ICD-10-CM | POA: Diagnosis not present

## 2018-04-06 DIAGNOSIS — M4802 Spinal stenosis, cervical region: Secondary | ICD-10-CM | POA: Diagnosis not present

## 2018-04-06 DIAGNOSIS — M9902 Segmental and somatic dysfunction of thoracic region: Secondary | ICD-10-CM | POA: Diagnosis not present

## 2018-04-06 DIAGNOSIS — M9903 Segmental and somatic dysfunction of lumbar region: Secondary | ICD-10-CM | POA: Diagnosis not present

## 2018-04-06 DIAGNOSIS — R29898 Other symptoms and signs involving the musculoskeletal system: Secondary | ICD-10-CM | POA: Diagnosis not present

## 2018-04-06 DIAGNOSIS — M9901 Segmental and somatic dysfunction of cervical region: Secondary | ICD-10-CM | POA: Diagnosis not present

## 2018-04-06 DIAGNOSIS — M531 Cervicobrachial syndrome: Secondary | ICD-10-CM | POA: Diagnosis not present

## 2018-04-06 MED ORDER — PREDNISONE 20 MG PO TABS: 20.0000 mg | ORAL_TABLET | Freq: Two times a day (BID) | ORAL | 0 refills | Status: DC

## 2018-04-06 MED ORDER — TIZANIDINE HCL 4 MG PO CAPS: 4.0000 mg | ORAL_CAPSULE | Freq: Three times a day (TID) | ORAL | 0 refills | Status: DC | PRN

## 2018-04-06 NOTE — Addendum Note (Signed)
Addended by: Sharon Seller B on: 04/06/2018 05:13 PM   Modules accepted: Orders

## 2018-04-06 NOTE — Telephone Encounter (Signed)
Unfortunately I was not aware of these as we focused on a different issue. We can try another prednisone burst until he can come back in to discuss. OK to order 40 mg daily for 5 days if he is agreeable. TY.

## 2018-04-06 NOTE — Telephone Encounter (Signed)
Patient wanted to mention to PCP today pain continuing in his right shoulder and left back side. Happens at night and is unable to sleep to to intense pain. Also did clarifiy with the patient and his daughter change in the patients metoprolol on 03/17/2018 (see telephone note dated 03/17/2018 from Dr. Victorino December office). The patient was not aware of this change. The daughter will pickup at the pharmacy this refill sent in on 03/17/2018 by Dr. Sallyanne Kuster.

## 2018-04-06 NOTE — Progress Notes (Signed)
Chief Complaint  Patient presents with  . Follow-up    Subjective: Patient is a 85 y.o. male here for follow-up neck pain.  Patient was seen 1 week ago and diagnosed with neck pain.  Imaging showed cervical stenosis.  He was reportedly given stretches and exercises that were not helpful.  The prednisone did help initially.  The muscle relaxant was somewhat helpful.  He did not have any side effects with this.  He does have access to physical therapy at his home living facility.  He is having some weakness in his R upper extremity.  ROS: MSK: +neck pain Neuro: +RUE weakness  Past Medical History:  Diagnosis Date  . Brain aneurysm   . Chronic combined systolic and diastolic heart failure, NYHA class 3 (HCC)    EF has seemed to vary report-to-report. Myoview 2007 showed EF 30%, echo in 2016 7 EF 60-65% -> Echo in 08/2015 & 08/2016  40-45%  (with basal-mid inferoseptal & apical inferolateral wall), Mod P HTN (PAP ~53 mHg) --most recent in January 2019 says EF 45-50%.  . CKD (chronic kidney disease), stage III ([place]) 08/26/2016  . COPD (chronic obstructive pulmonary disease) ([place])   . Coronary artery disease involving native heart with angina pectoris ([place])    a. MI 2002 - PCI to [place]. b. 2005: PCI to LAD, OM1 & OM2.  c. s/p CABG in 2013: LIMA to LAD, SVG to OM2, SVG to PDA and SVG to RI. ; d. Cath 2017: occluded LAD, OM1 & OM2, SVG-RI with patent LIMA-LAD, SVG-rPDA & SVG-OM2.   . Essential hypertension   . Hyperlipidemia with target LDL less than 70   . Moderate pulmonary arterial systolic hypertension (HCC) 08/28/2016   PA pressures F has been estimated at 53-59 mmHg on echo-   . Moderate to severe aortic stenosis 08/28/2016   Stenosis with mild regurgitation.  . Persistent atrial fibrillation   . Presence of biventricular implantable cardioverter-defibrillator (ICD) 2008  . Thyroid disease     Objective: BP 128/82 (BP Location: Left Arm, Patient Position: Sitting, Cuff Size: Normal)    Pulse (!) 59   Temp (!) 97.4 F (36.3 C) (Oral)   Ht 5\' 8"  (1.727 m)   Wt 197 lb (89.4 kg)   SpO2 93%   BMI 29.95 kg/m  General: Awake, appears stated age Heart: Brisk capillary refill Lungs: CTAB, no rales, wheezes or rhonchi. No accessory muscle use Neuro: 2/4 biceps reflex on R, 1/4 biceps reflex on L, no clonus, neg Sp[place]ings though he has poor ROM of neck MSK: No ttp, 4/5 strength with R shoulder ext rotation, 5/5 strength throughout otherwise Psych: Age appropriate judgment and insight, normal affect and mood  Assessment and Plan: Cervical stenosis of spinal canal - Plan: tiZANidine (ZANAFLEX) 4 MG capsule, Ambulatory referral to Physical Therapy  Weakness of right upper extremity  Orders as above. Stretches/exercises. PT. Will reach out to sports med. With weakness, he may need neurosurg.  F/u prn.  The patient voiced understanding and agreement to the plan.  [place], DO 04/06/18  4:56 PM

## 2018-04-06 NOTE — Patient Instructions (Addendum)
OK to take Tylenol 1000 mg (2 extra strength tabs) or 975 mg (3 regular strength tabs) every 6 hours as needed.  I am going to reach out to Dr Barbaraann Barthel regarding your case to see if he can offer assistance. We will be in touch regarding what he says and what the next steps are.  EXERCISES RANGE OF MOTION (ROM) AND STRETCHING EXERCISES  These exercises may help you when beginning to rehabilitate your issue. In order to successfully resolve your symptoms, you must improve your posture. These exercises are designed to help reduce the forward-head and rounded-shoulder posture which contributes to this condition. Your symptoms may resolve with or without further involvement from your physician, physical therapist or athletic trainer. While completing these exercises, remember:   Restoring tissue flexibility helps normal motion to return to the joints. This allows healthier, less painful movement and activity.  An effective stretch should be held for at least 20 seconds, although you may need to begin with shorter hold times for comfort.  A stretch should never be painful. You should only feel a gentle lengthening or release in the stretched tissue.  Do not do any stretch or exercise that you cannot tolerate.  STRETCH- Axial Extensors  Lie on your back on the floor. You may bend your knees for comfort. Place a rolled-up hand towel or dish towel, about 2 inches in diameter, under the part of your head that makes contact with the floor.  Gently tuck your chin, as if trying to make a "double chin," until you feel a gentle stretch at the base of your head.  Hold 15-20 seconds. Repeat 2-3 times. Complete this exercise 1 time per day.   STRETCH - Axial Extension   Stand or sit on a firm surface. Assume a good posture: chest up, shoulders drawn back, abdominal muscles slightly tense, knees unlocked (if standing) and feet hip width apart.  Slowly retract your chin so your head slides back and your chin  slightly lowers. Continue to look straight ahead.  You should feel a gentle stretch in the back of your head. Be certain not to feel an aggressive stretch since this can cause headaches later.  Hold for 15-20 seconds. Repeat 2-3 times. Complete this exercise 1 time per day.  STRETCH - Cervical Side Bend   Stand or sit on a firm surface. Assume a good posture: chest up, shoulders drawn back, abdominal muscles slightly tense, knees unlocked (if standing) and feet hip width apart.  Without letting your nose or shoulders move, slowly tip your right / left ear to your shoulder until your feel a gentle stretch in the muscles on the opposite side of your neck.  Hold 15-20 seconds. Repeat 2-3 times. Complete this exercise 1-2 times per day.  STRETCH - Cervical Rotators   Stand or sit on a firm surface. Assume a good posture: chest up, shoulders drawn back, abdominal muscles slightly tense, knees unlocked (if standing) and feet hip width apart.  Keeping your eyes level with the ground, slowly turn your head until you feel a gentle stretch along the back and opposite side of your neck.  Hold 15-20 seconds. Repeat 2-3 times. Complete this exercise 1-2 times per day.  RANGE OF MOTION - Neck Circles   Stand or sit on a firm surface. Assume a good posture: chest up, shoulders drawn back, abdominal muscles slightly tense, knees unlocked (if standing) and feet hip width apart.  Gently roll your head down and around from the back  of one shoulder to the back of the other. The motion should never be forced or painful.  Repeat the motion 10-20 times, or until you feel the neck muscles relax and loosen. Repeat 2-3 times. Complete the exercise 1-2 times per day. STRENGTHENING EXERCISES - Cervical Strain and Sprain These exercises may help you when beginning to rehabilitate your injury. They may resolve your symptoms with or without further involvement from your physician, physical therapist, or athletic  trainer. While completing these exercises, remember:   Muscles can gain both the endurance and the strength needed for everyday activities through controlled exercises.  Complete these exercises as instructed by your physician, physical therapist, or athletic trainer. Progress the resistance and repetitions only as guided.  You may experience muscle soreness or fatigue, but the pain or discomfort you are trying to eliminate should never worsen during these exercises. If this pain does worsen, stop and make certain you are following the directions exactly. If the pain is still present after adjustments, discontinue the exercise until you can discuss the trouble with your clinician.  STRENGTH - Cervical Flexors, Isometric  Face a wall, standing about 6 inches away. Place a small pillow, a ball about 6-8 inches in diameter, or a folded towel between your forehead and the wall.  Slightly tuck your chin and gently push your forehead into the soft object. Push only with mild to moderate intensity, building up tension gradually. Keep your jaw and forehead relaxed.  Hold 10 to 20 seconds. Keep your breathing relaxed.  Release the tension slowly. Relax your neck muscles completely before you start the next repetition. Repeat 2-3 times. Complete this exercise 1 time per day.  STRENGTH- Cervical Lateral Flexors, Isometric   Stand about 6 inches away from a wall. Place a small pillow, a ball about 6-8 inches in diameter, or a folded towel between the side of your head and the wall.  Slightly tuck your chin and gently tilt your head into the soft object. Push only with mild to moderate intensity, building up tension gradually. Keep your jaw and forehead relaxed.  Hold 10 to 20 seconds. Keep your breathing relaxed.  Release the tension slowly. Relax your neck muscles completely before you start the next repetition. Repeat 2-3 times. Complete this exercise 1 time per day.  STRENGTH - Cervical  Extensors, Isometric   Stand about 6 inches away from a wall. Place a small pillow, a ball about 6-8 inches in diameter, or a folded towel between the back of your head and the wall.  Slightly tuck your chin and gently tilt your head back into the soft object. Push only with mild to moderate intensity, building up tension gradually. Keep your jaw and forehead relaxed.  Hold 10 to 20 seconds. Keep your breathing relaxed.  Release the tension slowly. Relax your neck muscles completely before you start the next repetition. Repeat 2-3 times. Complete this exercise 1 time per day.  POSTURE AND BODY MECHANICS CONSIDERATIONS Keeping correct posture when sitting, standing or completing your activities will reduce the stress put on different body tissues, allowing injured tissues a chance to heal and limiting painful experiences. The following are general guidelines for improved posture. Your physician or physical therapist will provide you with any instructions specific to your needs. While reading these guidelines, remember:  The exercises prescribed by your provider will help you have the flexibility and strength to maintain correct postures.  The correct posture provides the optimal environment for your joints to work. All  of your joints have less wear and tear when properly supported by a spine with good posture. This means you will experience a healthier, less painful body.  Correct posture must be practiced with all of your activities, especially prolonged sitting and standing. Correct posture is as important when doing repetitive low-stress activities (typing) as it is when doing a single heavy-load activity (lifting).  PROLONGED STANDING WHILE SLIGHTLY LEANING FORWARD When completing a task that requires you to lean forward while standing in one place for a long time, place either foot up on a stationary 2- to 4-inch high object to help maintain the best posture. When both feet are on the  ground, the low back tends to lose its slight inward curve. If this curve flattens (or becomes too large), then the back and your other joints will experience too much stress, fatigue more quickly, and can cause pain.   RESTING POSITIONS Consider which positions are most painful for you when choosing a resting position. If you have pain with flexion-based activities (sitting, bending, stooping, squatting), choose a position that allows you to rest in a less flexed posture. You would want to avoid curling into a fetal position on your side. If your pain worsens with extension-based activities (prolonged standing, working overhead), avoid resting in an extended position such as sleeping on your stomach. Most people will find more comfort when they rest with their spine in a more neutral position, neither too rounded nor too arched. Lying on a non-sagging bed on your side with a pillow between your knees, or on your back with a pillow under your knees will often provide some relief. Keep in mind, being in any one position for a prolonged period of time, no matter how correct your posture, can still lead to stiffness.  WALKING Walk with an upright posture. Your ears, shoulders, and hips should all line up. OFFICE WORK When working at a desk, create an environment that supports good, upright posture. Without extra support, muscles fatigue and lead to excessive strain on joints and other tissues.  CHAIR:  A chair should be able to slide under your desk when your back makes contact with the back of the chair. This allows you to work closely.  The chair's height should allow your eyes to be level with the upper part of your monitor and your hands to be slightly lower than your elbows.  Body position: ? Your feet should make contact with the floor. If this is not possible, use a foot rest. ? Keep your ears over your shoulders. This will reduce stress on your neck and low back.

## 2018-04-06 NOTE — Telephone Encounter (Signed)
Sent in prednisone and informed the patient. Called the daughter (left detailed message) to inform to pickup prescription///also PCP wants to see him in the office later this week or next regarding the shoulder pain to followup. PCP also after speaking to Sports medicine is ordering PT through [facility].

## 2018-04-08 DIAGNOSIS — M5136 Other intervertebral disc degeneration, lumbar region: Secondary | ICD-10-CM | POA: Diagnosis not present

## 2018-04-08 DIAGNOSIS — M5414 Radiculopathy, thoracic region: Secondary | ICD-10-CM | POA: Diagnosis not present

## 2018-04-08 DIAGNOSIS — M531 Cervicobrachial syndrome: Secondary | ICD-10-CM | POA: Diagnosis not present

## 2018-04-08 DIAGNOSIS — M9901 Segmental and somatic dysfunction of cervical region: Secondary | ICD-10-CM | POA: Diagnosis not present

## 2018-04-08 DIAGNOSIS — M9902 Segmental and somatic dysfunction of thoracic region: Secondary | ICD-10-CM | POA: Diagnosis not present

## 2018-04-08 DIAGNOSIS — M9903 Segmental and somatic dysfunction of lumbar region: Secondary | ICD-10-CM | POA: Diagnosis not present

## 2018-04-09 NOTE — Telephone Encounter (Signed)
I reached out to sports med team to see if they could offer anything. Discussed case, they could not add much. PT is recommended for this issue and that is what was ordered. TY.

## 2018-04-09 NOTE — Telephone Encounter (Signed)
Kenneth Bell, pts daughter, called to check status of Sports Med referral. Per notes pt is to have PT instead? Please call Kenneth Bell at [phone].

## 2018-04-09 NOTE — Telephone Encounter (Signed)
Spoke to our [facility] referral coodinator---[facility]'s rep has not as of yet contacted the patient.  So [facility] called [facility] and got fax number to fax directly order to them. So the patient should now hear from them possible as early as today. Family informed

## 2018-04-10 ENCOUNTER — Emergency Department (HOSPITAL_COMMUNITY): Payer: Medicare Other

## 2018-04-10 ENCOUNTER — Telehealth: Payer: Self-pay | Admitting: Family Medicine

## 2018-04-10 ENCOUNTER — Other Ambulatory Visit: Payer: Self-pay

## 2018-04-10 ENCOUNTER — Telehealth: Payer: Self-pay | Admitting: Medical

## 2018-04-10 ENCOUNTER — Emergency Department (HOSPITAL_COMMUNITY)
Admission: EM | Admit: 2018-04-10 | Discharge: 2018-04-11 | Disposition: A | Discharge status: Home / Self Care | Payer: Medicare Other | Source: Home / Self Care | Attending: Emergency Medicine | Admitting: Emergency Medicine

## 2018-04-10 ENCOUNTER — Emergency Department (HOSPITAL_COMMUNITY)
Admission: EM | Admit: 2018-04-10 | Discharge: 2018-04-10 | Disposition: A | Discharge status: Home / Self Care | Payer: Medicare Other | Attending: Emergency Medicine | Admitting: Emergency Medicine

## 2018-04-10 DIAGNOSIS — M542 Cervicalgia: Secondary | ICD-10-CM | POA: Diagnosis not present

## 2018-04-10 DIAGNOSIS — I4891 Unspecified atrial fibrillation: Secondary | ICD-10-CM

## 2018-04-10 DIAGNOSIS — S4991XA Unspecified injury of right shoulder and upper arm, initial encounter: Secondary | ICD-10-CM | POA: Diagnosis not present

## 2018-04-10 DIAGNOSIS — S299XXA Unspecified injury of thorax, initial encounter: Secondary | ICD-10-CM | POA: Diagnosis not present

## 2018-04-10 DIAGNOSIS — I5042 Chronic combined systolic (congestive) and diastolic (congestive) heart failure: Secondary | ICD-10-CM | POA: Insufficient documentation

## 2018-04-10 DIAGNOSIS — N183 Chronic kidney disease, stage 3 (moderate): Secondary | ICD-10-CM | POA: Insufficient documentation

## 2018-04-10 DIAGNOSIS — E860 Dehydration: Secondary | ICD-10-CM

## 2018-04-10 DIAGNOSIS — S46811A Strain of other muscles, fascia and tendons at shoulder and upper arm level, right arm, initial encounter: Secondary | ICD-10-CM | POA: Diagnosis not present

## 2018-04-10 DIAGNOSIS — S46911A Strain of unspecified muscle, fascia and tendon at shoulder and upper arm level, right arm, initial encounter: Secondary | ICD-10-CM | POA: Insufficient documentation

## 2018-04-10 DIAGNOSIS — Y92129 Unspecified place in nursing home as the place of occurrence of the external cause: Secondary | ICD-10-CM

## 2018-04-10 DIAGNOSIS — J449 Chronic obstructive pulmonary disease, unspecified: Secondary | ICD-10-CM

## 2018-04-10 DIAGNOSIS — Z7901 Long term (current) use of anticoagulants: Secondary | ICD-10-CM

## 2018-04-10 DIAGNOSIS — Z79899 Other long term (current) drug therapy: Secondary | ICD-10-CM | POA: Insufficient documentation

## 2018-04-10 DIAGNOSIS — R791 Abnormal coagulation profile: Secondary | ICD-10-CM | POA: Insufficient documentation

## 2018-04-10 DIAGNOSIS — I25119 Atherosclerotic heart disease of native coronary artery with unspecified angina pectoris: Secondary | ICD-10-CM | POA: Insufficient documentation

## 2018-04-10 DIAGNOSIS — Y999 Unspecified external cause status: Secondary | ICD-10-CM | POA: Insufficient documentation

## 2018-04-10 DIAGNOSIS — I130 Hypertensive heart and chronic kidney disease with heart failure and stage 1 through stage 4 chronic kidney disease, or unspecified chronic kidney disease: Secondary | ICD-10-CM

## 2018-04-10 DIAGNOSIS — Y939 Activity, unspecified: Secondary | ICD-10-CM

## 2018-04-10 DIAGNOSIS — R4182 Altered mental status, unspecified: Secondary | ICD-10-CM | POA: Insufficient documentation

## 2018-04-10 DIAGNOSIS — W1830XA Fall on same level, unspecified, initial encounter: Secondary | ICD-10-CM | POA: Insufficient documentation

## 2018-04-10 DIAGNOSIS — R51 Headache: Secondary | ICD-10-CM | POA: Diagnosis not present

## 2018-04-10 DIAGNOSIS — S0990XA Unspecified injury of head, initial encounter: Secondary | ICD-10-CM

## 2018-04-10 DIAGNOSIS — R52 Pain, unspecified: Secondary | ICD-10-CM | POA: Diagnosis not present

## 2018-04-10 DIAGNOSIS — Z955 Presence of coronary angioplasty implant and graft: Secondary | ICD-10-CM | POA: Insufficient documentation

## 2018-04-10 DIAGNOSIS — W19XXXA Unspecified fall, initial encounter: Secondary | ICD-10-CM

## 2018-04-10 DIAGNOSIS — Z743 Need for continuous supervision: Secondary | ICD-10-CM | POA: Diagnosis not present

## 2018-04-10 DIAGNOSIS — R296 Repeated falls: Secondary | ICD-10-CM

## 2018-04-10 DIAGNOSIS — R279 Unspecified lack of coordination: Secondary | ICD-10-CM | POA: Diagnosis not present

## 2018-04-10 DIAGNOSIS — R404 Transient alteration of awareness: Secondary | ICD-10-CM | POA: Diagnosis not present

## 2018-04-10 DIAGNOSIS — M25511 Pain in right shoulder: Secondary | ICD-10-CM | POA: Diagnosis not present

## 2018-04-10 DIAGNOSIS — T796XXA Traumatic ischemia of muscle, initial encounter: Secondary | ICD-10-CM | POA: Diagnosis not present

## 2018-04-10 DIAGNOSIS — R5381 Other malaise: Secondary | ICD-10-CM | POA: Diagnosis not present

## 2018-04-10 DIAGNOSIS — S199XXA Unspecified injury of neck, initial encounter: Secondary | ICD-10-CM | POA: Diagnosis not present

## 2018-04-10 DIAGNOSIS — I509 Heart failure, unspecified: Secondary | ICD-10-CM | POA: Insufficient documentation

## 2018-04-10 DIAGNOSIS — R0902 Hypoxemia: Secondary | ICD-10-CM | POA: Diagnosis not present

## 2018-04-10 DIAGNOSIS — M25519 Pain in unspecified shoulder: Secondary | ICD-10-CM | POA: Diagnosis not present

## 2018-04-10 DIAGNOSIS — I10 Essential (primary) hypertension: Secondary | ICD-10-CM | POA: Diagnosis not present

## 2018-04-10 DIAGNOSIS — S3993XA Unspecified injury of pelvis, initial encounter: Secondary | ICD-10-CM | POA: Diagnosis not present

## 2018-04-10 LAB — CBC WITH DIFFERENTIAL/PLATELET
Abs Immature Granulocytes: 0.14 10*3/uL — ABNORMAL HIGH (ref 0.00–0.07)
Basophils Absolute: 0 10*3/uL (ref 0.0–0.1)
Basophils Relative: 1 %
Eosinophils Absolute: 0.1 10*3/uL (ref 0.0–0.5)
Eosinophils Relative: 1 %
HCT: 39.1 % (ref 39.0–52.0)
Hemoglobin: 11.7 g/dL — ABNORMAL LOW (ref 13.0–17.0)
Immature Granulocytes: 2 %
Lymphocytes Relative: 4 %
Lymphs Abs: 0.3 10*3/uL — ABNORMAL LOW (ref 0.7–4.0)
MCH: 28.8 pg (ref 26.0–34.0)
MCHC: 29.9 g/dL — ABNORMAL LOW (ref 30.0–36.0)
MCV: 96.3 fL (ref 80.0–100.0)
MONOS PCT: 11 %
Monocytes Absolute: 0.8 10*3/uL (ref 0.1–1.0)
Neutro Abs: 6.2 10*3/uL (ref 1.7–7.7)
Neutrophils Relative %: 81 %
Platelets: 206 10*3/uL (ref 150–400)
RBC: 4.06 MIL/uL — ABNORMAL LOW (ref 4.22–5.81)
RDW: 15.6 % — ABNORMAL HIGH (ref 11.5–15.5)
WBC: 7.6 10*3/uL (ref 4.0–10.5)
nRBC: 0.4 % — ABNORMAL HIGH (ref 0.0–0.2)

## 2018-04-10 LAB — PROTIME-INR
INR: 1.9 — ABNORMAL HIGH (ref 0.8–1.2)
Prothrombin Time: 21.5 seconds — ABNORMAL HIGH (ref 11.4–15.2)

## 2018-04-10 LAB — COMPREHENSIVE METABOLIC PANEL
ALT: 24 U/L (ref 0–44)
AST: 42 U/L — ABNORMAL HIGH (ref 15–41)
Albumin: 3.6 g/dL (ref 3.5–5.0)
Alkaline Phosphatase: 51 U/L (ref 38–126)
Anion gap: 13 (ref 5–15)
BUN: 70 mg/dL — ABNORMAL HIGH (ref 8–23)
CO2: 20 mmol/L — ABNORMAL LOW (ref 22–32)
Calcium: 10.6 mg/dL — ABNORMAL HIGH (ref 8.9–10.3)
Chloride: 104 mmol/L (ref 98–111)
Creatinine, Ser: 2.42 mg/dL — ABNORMAL HIGH (ref 0.61–1.24)
GFR calc Af Amer: 26 mL/min — ABNORMAL LOW (ref >60)
GFR calc non Af Amer: 23 mL/min — ABNORMAL LOW (ref >60)
Glucose, Bld: 112 mg/dL — ABNORMAL HIGH (ref 70–99)
POTASSIUM: 4 mmol/L (ref 3.5–5.1)
Sodium: 137 mmol/L (ref 135–145)
Total Bilirubin: 1.5 mg/dL — ABNORMAL HIGH (ref 0.3–1.2)
Total Protein: 7.3 g/dL (ref 6.5–8.1)

## 2018-04-10 LAB — URINALYSIS, ROUTINE W REFLEX MICROSCOPIC
Bacteria, UA: NONE SEEN
Bilirubin Urine: NEGATIVE
Glucose, UA: NEGATIVE mg/dL
Ketones, ur: NEGATIVE mg/dL
Leukocytes,Ua: NEGATIVE
Nitrite: NEGATIVE
Protein, ur: 100 mg/dL — AB
SPECIFIC GRAVITY, URINE: 1.02 (ref 1.005–1.030)
pH: 5 (ref 5.0–8.0)

## 2018-04-10 LAB — CK: Total CK: 493 U/L — ABNORMAL HIGH (ref 49–397)

## 2018-04-10 LAB — CBG MONITORING, ED: GLUCOSE-CAPILLARY: 100 mg/dL — AB (ref 70–99)

## 2018-04-10 MED ORDER — SODIUM CHLORIDE 0.9 % IV BOLUS
1000.0000 mL | Freq: Once | INTRAVENOUS | Status: AC
  Administered 2018-04-10: 1000 mL via INTRAVENOUS

## 2018-04-10 NOTE — Telephone Encounter (Signed)
Note sent to staff.

## 2018-04-10 NOTE — Telephone Encounter (Signed)
OK 

## 2018-04-10 NOTE — Telephone Encounter (Signed)
Copied from [place] [id number]. Topic: Quick Communication - See Telephone Encounter >> Apr 10, 2018 11:08 AM Vernona Rieger wrote: CRM for notification. See Telephone encounter for: 04/10/18. [place], [place] with [facility] called to let Dr Nani Ravens know he has had 2 falls today. She also said he is confused and she thinks he is not taking his medications right. He takes his medications himself. She is going to fax something to the office & would like Dr Nani Ravens to approve that the staff there administer his medications.

## 2018-04-10 NOTE — ED Triage Notes (Signed)
Pt from [facility].  A&Ox4  Fell, c/o rt shoulder pain. c-collar

## 2018-04-10 NOTE — ED Notes (Signed)
Patient verbalizes understanding of discharge instructions. Opportunity for questioning and answers were provided. Armband removed by staff, pt discharged from ED.  

## 2018-04-10 NOTE — ED Triage Notes (Signed)
Pt here from [facility], normally A/O x4 and ambulatory. LKW 2000 last night. Pt had two unwitnessed falls this morning and is only A/O x1 today. Pt c/o R shoulder pain, which he sts is chronic but also sts he fell on his shoulder. EMS reports decreased range of motion. C collar in place.

## 2018-04-10 NOTE — ED Notes (Signed)
Patient transported to CT 

## 2018-04-10 NOTE — Telephone Encounter (Signed)
Is Dr. Nani Ravens here today? If so can you forward message about pt fall and there med request to him?

## 2018-04-10 NOTE — ED Provider Notes (Signed)
[facility] EMERGENCY DEPARTMENT Provider Note   CSN: [id number] Arrival date & time: 04/10/18  1142    History   Chief Complaint Chief Complaint  Patient presents with  . Altered Mental Status    HPI Kenneth Bell is a 85 y.o. male.     Pt presents to the ED today with a fall.  He lives at [facility] and fell around 2000 last night.  He also had 2 falls this morning.  Pt c/o neck and right shoulder pain.  Pt said he fell because he was looking for his TV remote.  He is on coumadin for a.fib.  CHA2DS2/VAS Stroke Risk Points  Current as of 11 minutes ago     7 >= 2 Points: High Risk  1 - 1.99 Points: Medium Risk  0 Points: Low Risk    This is the only CHA2DS2/VAS Stroke Risk Points available for the past  year.:  Last Change: N/A     Details    This score determines the patient's risk of having a stroke if the  patient has atrial fibrillation.       Points Metrics  1 Has Congestive Heart Failure:  Yes    Current as of 11 minutes ago  1 Has Vascular Disease:  Yes    Current as of 11 minutes ago  1 Has Hypertension:  Yes    Current as of 11 minutes ago  2 Age:  65    Current as of 11 minutes ago  0 Has Diabetes:  No    Current as of 11 minutes ago  2 Had Stroke:  Yes  Had TIA:  No  Had thromboembolism:  No    Current as of 11 minutes ago  0 Male:  No    Current as of 11 minutes ago              Past Medical History:  Diagnosis Date  . Brain aneurysm   . Chronic combined systolic and diastolic heart failure, NYHA class 3 (HCC)    EF has seemed to vary report-to-report. Myoview 2007 showed EF 30%, echo in 2016 7 EF 60-65% -> Echo in 08/2015 & 08/2016  40-45%  (with basal-mid inferoseptal & apical inferolateral wall), Mod P HTN (PAP ~53 mHg) --most recent in January 2019 says EF 45-50%.  . CKD (chronic kidney disease), stage III ([place]) 08/26/2016  . COPD (chronic obstructive pulmonary disease) ([place])   . Coronary artery disease  involving native heart with angina pectoris ([place])    a. MI 2002 - PCI to [place]. b. 2005: PCI to LAD, OM1 & OM2.  c. s/p CABG in 2013: LIMA to LAD, SVG to OM2, SVG to PDA and SVG to RI. ; d. Cath 2017: occluded LAD, OM1 & OM2, SVG-RI with patent LIMA-LAD, SVG-rPDA & SVG-OM2.   . Essential hypertension   . Hyperlipidemia with target LDL less than 70   . Moderate pulmonary arterial systolic hypertension (HCC) 08/28/2016   PA pressures F has been estimated at 53-59 mmHg on echo-   . Moderate to severe aortic stenosis 08/28/2016   Stenosis with mild regurgitation.  . Persistent atrial fibrillation   . Presence of biventricular implantable cardioverter-defibrillator (ICD) 2008  . Thyroid disease     Patient Active Problem List   Diagnosis Date Noted  . Cervical stenosis of spinal canal 04/06/2018  . Chest pain 03/01/2018  . CHF (congestive heart failure) ([place]) 01/25/2018  . Acute respiratory failure with hypoxemia (  [place]) 01/24/2018  . Acute on chronic systolic heart failure ([place]) 01/23/2018  . Pain in both forearms 01/14/2018  . Cerebral thrombosis with cerebral infarction 02/21/2017  . Cerebral embolism with cerebral infarction 02/21/2017  . Subarachnoid hemorrhage 02/21/2017  . Confusion 02/20/2017  . Brain aneurysm   . Thyroid disease   . Hyperlipidemia with target LDL less than 70   . Essential hypertension   . Coronary artery disease involving native heart with angina pectoris ([place])   . COPD (chronic obstructive pulmonary disease) ([place])   . Aortic stenosis, mild 09/13/2016  . Ischemic cardiomyopathy 08/28/2016  . Moderate pulmonary arterial systolic hypertension ([place]) 08/28/2016  . Mild aortic insufficiency 08/28/2016  . Elevated troponin   . Chronic combined systolic and diastolic heart failure, NYHA class 3 ([place]) 08/26/2016  . CKD (chronic kidney disease), stage III ([place]) 08/26/2016  . Persistent atrial fibrillation ([place]); CHA2DS2Vasc = ~4 (CHF, MI/aortic plaque, Age 34)  08/21/2016  . Biventricular ICD (implantable cardioverter-defibrillator) in place 09/13/2012  . Coronary artery disease involving coronary bypass graft of native heart with angina pectoris ([place]) 08/26/2012  . Presence of biventricular implantable cardioverter-defibrillator (ICD) 02/11/2006    Past Surgical History:  Procedure Laterality Date  . APPENDECTOMY    . CARDIAC CATHETERIZATION  06/2015   Left main patent. Diffuse LAD disease with competitive flow and distal LAD from LIMA. Mid Cx 35% with occlusion of OM 1 and 90% dCx. OM 2 occluded (perfuse via SVG). Ostial RCA 70%, dRCA 100%.  Patent LIMA-LAD. Widely patent SVG-OM, SVG-PDA: 40% proximal. 100% proximal SVG-RI  . CARDIAC SURGERY    . CARPAL TUNNEL RELEASE Right 2017  . COLONOSCOPY  09/05/2012   Colon Polyps  . CORONARY ANGIOPLASTY WITH STENT PLACEMENT     Prior PCI to OM 2; 2005 PCI to LAD and OM1 with a Taxus DES 2.5 x 16 mm (postdilated to 3.8 mm) and PTCA of OM 2 stent  . CORONARY ARTERY BYPASS GRAFT  2013   LIMA-LAD, SVG-L1-2, SVG-PDA, SVG-RI (known occlusion of SVG RI)  . KNEE SURGERY Bilateral 1999  . NM MYOVIEW LTD  04/2015   Prior lateral infarct with no scanning. EF 31% with moderate global HK  . PROSTATE SURGERY    . SHOULDER SURGERY Bilateral    Twice  . TRANSTHORACIC ECHOCARDIOGRAM  08/2015   Moderate LV dysfunction with global HK (estimated at 40 and 45%). Worse anterolateral and inferolateral wall.  . TRANSTHORACIC ECHOCARDIOGRAM  08/2016   EF 40-45%. Moderate concentric LVH. Akinesis of basal-mid inferoseptal and apical inferolateral wall.  Aortic sclerosis, no stenosis. Moderate mitral thickening with mild MR. Moderate RV dilation. Moderate TR and PR. PA pressures 53 mmHg  . TRANSTHORACIC ECHOCARDIOGRAM  02/2017    EF 45-50% (similar to last).  Apical-inferolateral akinesis.  Severe focal basal and moderate concentric LVH.  Mild aortic stenosis.  Elevated PA pressures with peak pressures estimated 59 mmHg  (moderate PA hypertension)  . VEIN BYPASS SURGERY  2013        Home Medications    Prior to Admission medications   Medication Sig Start Date End Date Taking? Authorizing Provider  aspirin 81 MG chewable tablet Chew 81 mg by mouth daily.   Yes [provider]  bumetanide (BUMEX) 2 MG tablet Take 2 mg by mouth See admin instructions. Take 2 mg daily, may take one additional 2mg  as needed for weight gain greater than 8 pounds 02/15/18  Yes [provider]  cyanocobalamin 1000 MCG tablet Take 1 tablet (1,000  mcg total) by mouth daily. 03/12/18  Yes [name], DO  levETIRAcetam (KEPPRA) 500 MG tablet TAKE 1 TABLET BY MOUTH TWICE DAILY Patient taking differently: Take 500 mg by mouth 2 (two) times daily.  03/05/18  Yes Wendling, Crosby Oyster, DO  levothyroxine (SYNTHROID, LEVOTHROID) 50 MCG tablet TAKE 1 TABLET BY MOUTH ONCE DAILY Patient taking differently: Take 50 mcg by mouth daily before breakfast.  02/19/18  Yes Wendling, Crosby Oyster, DO  metolazone (ZAROXOLYN) 2.5 MG tablet Take 1 tablet (2.5 mg total) by mouth every 7 (seven) days. 02/02/18  Yes Irene Pap N, DO  metoprolol succinate (TOPROL-XL) 25 MG 24 hr tablet Take 1.5 tablets (37.5 mg total) by mouth daily. Patient taking differently: Take 25 mg by mouth daily.  03/17/18  Yes Croitoru, Mihai, MD  omeprazole (PRILOSEC) 20 MG capsule TAKE 1 CAPSULE BY MOUTH ONCE DAILY Patient taking differently: Take 20 mg by mouth daily.  08/04/17  Yes [name], DO  potassium chloride (K-DUR) 10 MEQ tablet Take 1 tablet (10 mEq total) by mouth daily. 03/12/18  Yes Wendling, Crosby Oyster, DO  simvastatin (ZOCOR) 20 MG tablet TAKE 1 TABLET BY MOUTH ONCE DAILY IN THE EVENING Patient taking differently: Take 20 mg by mouth daily at 6 PM.  02/12/18  Yes Wendling, Crosby Oyster, DO  tiZANidine (ZANAFLEX) 4 MG capsule Take 1 capsule (4 mg total) by mouth 3 (three) times daily as needed for muscle spasms. 1 tab po q hs  04/06/18  Yes Wendling, Crosby Oyster, DO  warfarin (COUMADIN) 6 MG tablet Take 6 mg by mouth See admin instructions. Take one tablet (6mg ) on SUN ,WED THUR SAT and SUN. 02/02/18  Yes [name], DO  warfarin (COUMADIN) 7.5 MG tablet Take 1 tab on Mondays and Fridays. - hold Today's dose as of 03/02/2018 Patient taking differently: Take 7.5 mg by mouth See admin instructions. 7.5 mg on Tuesday and friday 03/02/18  Yes Elgergawy, Silver Huguenin, MD  predniSONE (DELTASONE) 20 MG tablet Take 1 tablet (20 mg total) by mouth 2 (two) times daily with a meal. Take for 5 days Patient not taking: Reported on 04/10/2018 04/06/18   [name], DO    Family History Family History  Problem Relation Age of Onset  . Arthritis Mother   . Heart disease Father     Social History Social History   Tobacco Use  . Smoking status: Former Smoker    Types: Cigarettes    Last attempt to quit: 05/30/1967    Years since quitting: 50.8  . Smokeless tobacco: Never Used  Substance Use Topics  . Alcohol use: Yes    Alcohol/week: 6.0 standard drinks    Types: 6 Glasses of wine per week    Comment: weekly  . Drug use: No     Allergies   Lipitor [atorvastatin] and Nitroglycerin   Review of Systems Review of Systems  Musculoskeletal: Positive for neck pain.       Right shoulder  All other systems reviewed and are negative.    Physical Exam Updated Vital Signs BP (!) 146/91   Pulse (!) 59   Temp 99.4 F (37.4 C) (Rectal)   Resp (!) 21   SpO2 92%   Physical Exam Vitals signs and nursing note reviewed.  Constitutional:      Appearance: Normal appearance.  HENT:     Head: Normocephalic and atraumatic.     Right Ear: External ear normal.     Left Ear: External ear  normal.     Nose: Nose normal.     Mouth/Throat:     Mouth: Mucous membranes are moist.  Eyes:     Extraocular Movements: Extraocular movements intact.     Pupils: Pupils are equal, round, and reactive to light.    Neck:     Musculoskeletal: Spinous process tenderness and muscular tenderness present.     Comments: C-collar in place Cardiovascular:     Rate and Rhythm: Normal rate. Rhythm irregular.  Pulmonary:     Effort: Pulmonary effort is normal.     Breath sounds: Normal breath sounds.  Abdominal:     General: Abdomen is flat.     Palpations: Abdomen is soft.  Musculoskeletal:       Arms:  Skin:    General: Skin is warm and dry.     Capillary Refill: Capillary refill takes less than 2 seconds.  Neurological:     General: No focal deficit present.     Mental Status: He is alert and oriented to person, place, and time.  Psychiatric:        Mood and Affect: Mood normal.        Behavior: Behavior normal.      ED Treatments / Results  Labs (all labs ordered are listed, but only abnormal results are displayed) Labs Reviewed  CBC WITH DIFFERENTIAL/PLATELET - Abnormal; Notable for the following components:      Result Value   RBC 4.06 (*)    Hemoglobin 11.7 (*)    MCHC 29.9 (*)    RDW 15.6 (*)    nRBC 0.4 (*)    Lymphs Abs 0.3 (*)    Abs Immature Granulocytes 0.14 (*)    All other components within normal limits  COMPREHENSIVE METABOLIC PANEL - Abnormal; Notable for the following components:   CO2 20 (*)    Glucose, Bld 112 (*)    BUN 70 (*)    Creatinine, Ser 2.42 (*)    Calcium 10.6 (*)    AST 42 (*)    Total Bilirubin 1.5 (*)    GFR calc non Af Amer 23 (*)    GFR calc Af Amer 26 (*)    All other components within normal limits  URINALYSIS, ROUTINE W REFLEX MICROSCOPIC - Abnormal; Notable for the following components:   Hgb urine dipstick SMALL (*)    Protein, ur 100 (*)    All other components within normal limits  PROTIME-INR - Abnormal; Notable for the following components:   Prothrombin Time 21.5 (*)    INR 1.9 (*)    All other components within normal limits  CK - Abnormal; Notable for the following components:   Total CK 493 (*)    All other components within  normal limits  CBG MONITORING, ED - Abnormal; Notable for the following components:   Glucose-Capillary 100 (*)    All other components within normal limits    EKG EKG Interpretation  Date/Time:  Friday April 10 2018 11:49:27 EST Ventricular Rate:  78 PR Interval:    QRS Duration: 151 QT Interval:  457 QTC Calculation: 461 R Axis:   -90 Text Interpretation:  Electronic ventricular pacemaker Atrial fibrillation No significant change since last tracing Confirmed by Isla Pence [phone]) on 04/10/2018 12:16:51 PM   Radiology Dg Chest 2 View  Result Date: 04/10/2018 CLINICAL DATA:  Fall. EXAM: CHEST - 2 VIEW COMPARISON:  03/18/2018. 03/01/2018. 01/23/2018. 02/11/2017. 08/26/2016. FINDINGS: Prior CABG. AICD in stable position. Stable cardiomegaly. Pulmonary venous congestion. Mild  bilateral interstitial prominence noted. Mild component of CHF can not be completely excluded. Underlying chronic interstitial lung disease most likely present. No pleural effusion or pneumothorax. No acute bony abnormality identified. 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 IMPRESSION: 1. Prior CABG. AICD in stable position. Stable cardiomegaly mild pulmonary venous congestion. 2. Mild bilateral interstitial prominence again noted. A component of mild CHF can not be completely excluded. Underlying chronic interstitial lung disease most likely present. Electronically Signed   By: Marcello Moores  Register   On: 04/10/2018 13:32   Dg Shoulder Right  Result Date: 04/10/2018 CLINICAL DATA:  Fall. Pt stated that he fell yesterday. Pt said he slipped fell forward and slid down the wall. Pt stated that he has pain in his entire right shoulder, and also mentioned that he has arthritis in his right shoulder. Pt denied any cp. EXAM: RIGHT SHOULDER - 2+ VIEW COMPARISON:  03/30/2018 FINDINGS: No fracture or bone lesion. Mild narrowing of the inferior glenohumeral joint space. There is widening of the [facility]  joint, which may be postsurgical. Patient has a history of shoulder surgery. Skeletal structures are demineralized. Surrounding soft tissues are unremarkable. No change from the prior exam. IMPRESSION: 1. No fracture or dislocation. 2. Widened AC joints likely postsurgical in origin, stable from the prior study. Electronically Signed   By: Lajean Manes M.D.   On: 04/10/2018 13:32   Ct Head Wo Contrast  Result Date: 04/10/2018 CLINICAL DATA:  85 y/o M; 2 unwitnessed falls and altered mental status. EXAM: CT HEAD WITHOUT CONTRAST CT CERVICAL SPINE WITHOUT CONTRAST TECHNIQUE: Multidetector CT imaging of the head and cervical spine was performed following the standard protocol without intravenous contrast. Multiplanar CT image reconstructions of the cervical spine were also generated. COMPARISON:  02/20/2017 CT head. 02/21/2017 CT angiogram head. FINDINGS: CT HEAD FINDINGS Brain: No evidence of acute infarction, hemorrhage, hydrocephalus, extra-axial collection or mass lesion/mass effect. Stable nonspecific white matter hypodensities compatible with chronic microvascular ischemic changes and stable volume loss of the brain. Very small chronic infarction within the left cerebellar hemisphere is stable. Vascular: Calcific atherosclerosis of carotid siphons and the vertebral arteries. No hyperdense vessel identified. MCA bifurcation aneurysm measures 9.4 mm in the axial plane (series 4, image 17) which is stable in comparison with the prior CT of head. The aneurysm is better characterized on the prior CT angiogram of the head. Skull: Normal. Negative for fracture or focal lesion. Sinuses/Orbits: Multiple mucous retention cysts within the maxillary and sphenoid sinuses. Mild paranasal sinus mucosal thickening. Normal aeration of mastoid air cells. Bilateral intra-ocular lens replacement. Other: None. CT CERVICAL SPINE FINDINGS Alignment: Straightening of cervical lordosis. Minimal C4-5, T1-2, and T2-3 grade 1  anterolisthesis. Skull base and vertebrae: No acute fracture. No primary bone lesion or focal pathologic process. Soft tissues and spinal canal: No prevertebral fluid or swelling. No visible canal hematoma. Disc levels: Advanced cervical spondylosis with multilevel disc and facet degenerative changes. There is right-sided C3-4 erosive facet arthritis. Facet fusion of upper thoracic spine. Uncovertebral and facet hypertrophy results in high-grade bony neural foraminal stenosis at the left C3-4, bilateral C4-5, bilateral C5-6, bilateral C6-7 levels. No high-grade bony spinal canal stenosis. Upper chest: Part solid opacity in the right upper lobe is grossly stable measuring 20 x 20 mm (AP by ML series 9, image 86). Other: Aberrant right subclavian artery. IMPRESSION: CT head: 1. No acute intracranial abnormality or calvarial fracture identified. 2. Stable chronic microvascular ischemic changes and parenchymal volume loss of the  brain. 3. 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 4. Stable left MCA bifurcation aneurysm in comparison with prior CT of head, better characterized on prior CTA of head. CT cervical spine: 1. No acute fracture or dislocation identified. 2. Advanced cervical spondylosis with multilevel bony neural foraminal stenosis. No high-grade bony spinal canal stenosis. 3. Stable part solid opacity in the right upper lobe. Follow-up as per prior CT of chest recommendations. Electronically Signed   By: Kristine Garbe M.D.   On: 04/10/2018 16:05   Ct Cervical Spine Wo Contrast  Result Date: 04/10/2018 CLINICAL DATA:  85 y/o M; 2 unwitnessed falls and altered mental status. EXAM: CT HEAD WITHOUT CONTRAST CT CERVICAL SPINE WITHOUT CONTRAST TECHNIQUE: Multidetector CT imaging of the head and cervical spine was performed following the standard protocol without intravenous contrast. Multiplanar CT image reconstructions of the cervical spine were also generated. COMPARISON:   02/20/2017 CT head. 02/21/2017 CT angiogram head. FINDINGS: CT HEAD FINDINGS Brain: No evidence of acute infarction, hemorrhage, hydrocephalus, extra-axial collection or mass lesion/mass effect. Stable nonspecific white matter hypodensities compatible with chronic microvascular ischemic changes and stable volume loss of the brain. Very small chronic infarction within the left cerebellar hemisphere is stable. Vascular: Calcific atherosclerosis of carotid siphons and the vertebral arteries. No hyperdense vessel identified. MCA bifurcation aneurysm measures 9.4 mm in the axial plane (series 4, image 17) which is stable in comparison with the prior CT of head. The aneurysm is better characterized on the prior CT angiogram of the head. Skull: Normal. Negative for fracture or focal lesion. Sinuses/Orbits: Multiple mucous retention cysts within the maxillary and sphenoid sinuses. Mild paranasal sinus mucosal thickening. Normal aeration of mastoid air cells. Bilateral intra-ocular lens replacement. Other: None. CT CERVICAL SPINE FINDINGS Alignment: Straightening of cervical lordosis. Minimal C4-5, T1-2, and T2-3 grade 1 anterolisthesis. Skull base and vertebrae: No acute fracture. No primary bone lesion or focal pathologic process. Soft tissues and spinal canal: No prevertebral fluid or swelling. No visible canal hematoma. Disc levels: Advanced cervical spondylosis with multilevel disc and facet degenerative changes. There is right-sided C3-4 erosive facet arthritis. Facet fusion of upper thoracic spine. Uncovertebral and facet hypertrophy results in high-grade bony neural foraminal stenosis at the left C3-4, bilateral C4-5, bilateral C5-6, bilateral C6-7 levels. No high-grade bony spinal canal stenosis. Upper chest: Part solid opacity in the right upper lobe is grossly stable measuring 20 x 20 mm (AP by ML series 9, image 86). Other: Aberrant right subclavian artery. IMPRESSION: CT head: 1. No acute intracranial  abnormality or calvarial fracture identified. 2. Stable chronic microvascular ischemic changes and parenchymal volume loss of the brain. 3. 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 4. Stable left MCA bifurcation aneurysm in comparison with prior CT of head, better characterized on prior CTA of head. CT cervical spine: 1. No acute fracture or dislocation identified. 2. Advanced cervical spondylosis with multilevel bony neural foraminal stenosis. No high-grade bony spinal canal stenosis. 3. Stable part solid opacity in the right upper lobe. Follow-up as per prior CT of chest recommendations. Electronically Signed   By: Kristine Garbe M.D.   On: 04/10/2018 16:05    Procedures Procedures (including critical care time)  Medications Ordered in ED Medications  sodium chloride 0.9 % bolus 1,000 mL (0 mLs Intravenous Stopped 04/10/18 1430)     Initial Impression / Assessment and Plan / ED Course  I have reviewed the triage vital signs and the nursing notes.  Pertinent labs & imaging results that  were available during my care of the patient were reviewed by me and considered in my medical decision making (see chart for details).       Pt's ms has been excellent while here.  His CK is slightly elevated and BUN/Cr slightly elevated.  He was given IVFs.  I don't think it's bad enough for admission.  He feels good.   Pacemaker [employer]) was interrogated and he had no arrhythmias other than his chronic afib.  Return if worse.  F/u with pcp.  Final Clinical Impressions(s) / ED Diagnoses   Final diagnoses:  Dehydration  Fall, initial encounter  Traumatic rhabdomyolysis, initial encounter [facility])    ED Discharge Orders    None       Isla Pence, MD 04/10/18 1610

## 2018-04-10 NOTE — Telephone Encounter (Deleted)
Is Dr. Nani Ravens here? If so can you forward fall notification message and med change request to him?

## 2018-04-10 NOTE — ED Provider Notes (Signed)
[facility] EMERGENCY DEPARTMENT Provider Note   CSN: [id number] Arrival date & time: 04/10/18  2301    History   Chief Complaint Chief Complaint  Patient presents with  . Fall    HPI Kenneth Bell is a 85 y.o. male.     The history is provided by the patient.  Fall  This is a new problem. Episode onset: just prior to arrival. The problem occurs constantly. The problem has not changed since onset.Pertinent negatives include no abdominal pain. The symptoms are aggravated by walking. The symptoms are relieved by rest.  pt presents for another fall He has multiple medical conditions including CHF, afib, HTN He was just discharged from ER for fall and dehydration He got back to nursing facility and fell again, hitting his head and injuring his right shoulder. He has no other complaints  Past Medical History:  Diagnosis Date  . Brain aneurysm   . Chronic combined systolic and diastolic heart failure, NYHA class 3 (HCC)    EF has seemed to vary report-to-report. Myoview 2007 showed EF 30%, echo in 2016 7 EF 60-65% -> Echo in 08/2015 & 08/2016  40-45%  (with basal-mid inferoseptal & apical inferolateral wall), Mod P HTN (PAP ~53 mHg) --most recent in January 2019 says EF 45-50%.  . CKD (chronic kidney disease), stage III ([place]) 08/26/2016  . COPD (chronic obstructive pulmonary disease) ([place])   . Coronary artery disease involving native heart with angina pectoris ([place])    a. MI 2002 - PCI to [place]. b. 2005: PCI to LAD, OM1 & OM2.  c. s/p CABG in 2013: LIMA to LAD, SVG to OM2, SVG to PDA and SVG to RI. ; d. Cath 2017: occluded LAD, OM1 & OM2, SVG-RI with patent LIMA-LAD, SVG-rPDA & SVG-OM2.   . Essential hypertension   . Hyperlipidemia with target LDL less than 70   . Moderate pulmonary arterial systolic hypertension (HCC) 08/28/2016   PA pressures F has been estimated at 53-59 mmHg on echo-   . Moderate to severe aortic stenosis 08/28/2016   Stenosis with mild  regurgitation.  . Persistent atrial fibrillation   . Presence of biventricular implantable cardioverter-defibrillator (ICD) 2008  . Thyroid disease     Patient Active Problem List   Diagnosis Date Noted  . Cervical stenosis of spinal canal 04/06/2018  . Chest pain 03/01/2018  . CHF (congestive heart failure) ([place]) 01/25/2018  . Acute respiratory failure with hypoxemia ([place]) 01/24/2018  . Acute on chronic systolic heart failure ([place]) 01/23/2018  . Pain in both forearms 01/14/2018  . Cerebral thrombosis with cerebral infarction 02/21/2017  . Cerebral embolism with cerebral infarction 02/21/2017  . Subarachnoid hemorrhage 02/21/2017  . Confusion 02/20/2017  . Brain aneurysm   . Thyroid disease   . Hyperlipidemia with target LDL less than 70   . Essential hypertension   . Coronary artery disease involving native heart with angina pectoris ([place])   . COPD (chronic obstructive pulmonary disease) ([place])   . Aortic stenosis, mild 09/13/2016  . Ischemic cardiomyopathy 08/28/2016  . Moderate pulmonary arterial systolic hypertension ([place]) 08/28/2016  . Mild aortic insufficiency 08/28/2016  . Elevated troponin   . Chronic combined systolic and diastolic heart failure, NYHA class 3 ([place]) 08/26/2016  . CKD (chronic kidney disease), stage III ([place]) 08/26/2016  . Persistent atrial fibrillation ([place]); CHA2DS2Vasc = ~4 (CHF, MI/aortic plaque, Age 73) 08/21/2016  . Biventricular ICD (implantable cardioverter-defibrillator) in place 09/13/2012  . Coronary artery disease involving coronary bypass graft of  native heart with angina pectoris ([place]) 08/26/2012  . Presence of biventricular implantable cardioverter-defibrillator (ICD) 02/11/2006    Past Surgical History:  Procedure Laterality Date  . APPENDECTOMY    . CARDIAC CATHETERIZATION  06/2015   Left main patent. Diffuse LAD disease with competitive flow and distal LAD from LIMA. Mid Cx 35% with occlusion of OM 1 and 90% dCx. OM 2 occluded (perfuse  via SVG). Ostial RCA 70%, dRCA 100%.  Patent LIMA-LAD. Widely patent SVG-OM, SVG-PDA: 40% proximal. 100% proximal SVG-RI  . CARDIAC SURGERY    . CARPAL TUNNEL RELEASE Right 2017  . COLONOSCOPY  09/05/2012   Colon Polyps  . CORONARY ANGIOPLASTY WITH STENT PLACEMENT     Prior PCI to OM 2; 2005 PCI to LAD and OM1 with a Taxus DES 2.5 x 16 mm (postdilated to 3.8 mm) and PTCA of OM 2 stent  . CORONARY ARTERY BYPASS GRAFT  2013   LIMA-LAD, SVG-L1-2, SVG-PDA, SVG-RI (known occlusion of SVG RI)  . KNEE SURGERY Bilateral 1999  . NM MYOVIEW LTD  04/2015   Prior lateral infarct with no scanning. EF 31% with moderate global HK  . PROSTATE SURGERY    . SHOULDER SURGERY Bilateral    Twice  . TRANSTHORACIC ECHOCARDIOGRAM  08/2015   Moderate LV dysfunction with global HK (estimated at 40 and 45%). Worse anterolateral and inferolateral wall.  . TRANSTHORACIC ECHOCARDIOGRAM  08/2016   EF 40-45%. Moderate concentric LVH. Akinesis of basal-mid inferoseptal and apical inferolateral wall.  Aortic sclerosis, no stenosis. Moderate mitral thickening with mild MR. Moderate RV dilation. Moderate TR and PR. PA pressures 53 mmHg  . TRANSTHORACIC ECHOCARDIOGRAM  02/2017    EF 45-50% (similar to last).  Apical-inferolateral akinesis.  Severe focal basal and moderate concentric LVH.  Mild aortic stenosis.  Elevated PA pressures with peak pressures estimated 59 mmHg (moderate PA hypertension)  . VEIN BYPASS SURGERY  2013        Home Medications    Prior to Admission medications   Medication Sig Start Date End Date Taking? Authorizing Provider  aspirin 81 MG chewable tablet Chew 81 mg by mouth daily.    [provider]  bumetanide (BUMEX) 2 MG tablet Take 2 mg by mouth See admin instructions. Take 2 mg daily, may take one additional 2mg  as needed for weight gain greater than 8 pounds 02/15/18   [provider]  cyanocobalamin 1000 MCG tablet Take 1 tablet (1,000 mcg total) by mouth daily. 03/12/18    [name], DO  levETIRAcetam (KEPPRA) 500 MG tablet TAKE 1 TABLET BY MOUTH TWICE DAILY Patient taking differently: Take 500 mg by mouth 2 (two) times daily.  03/05/18   [name], DO  levothyroxine (SYNTHROID, LEVOTHROID) 50 MCG tablet TAKE 1 TABLET BY MOUTH ONCE DAILY Patient taking differently: Take 50 mcg by mouth daily before breakfast.  02/19/18   Wendling, Crosby Oyster, DO  metolazone (ZAROXOLYN) 2.5 MG tablet Take 1 tablet (2.5 mg total) by mouth every 7 (seven) days. 02/02/18   Kayleen Memos, DO  metoprolol succinate (TOPROL-XL) 25 MG 24 hr tablet Take 1.5 tablets (37.5 mg total) by mouth daily. Patient taking differently: Take 25 mg by mouth daily.  03/17/18   Croitoru, Mihai, MD  omeprazole (PRILOSEC) 20 MG capsule TAKE 1 CAPSULE BY MOUTH ONCE DAILY Patient taking differently: Take 20 mg by mouth daily.  08/04/17   [name], DO  potassium chloride (K-DUR) 10 MEQ tablet Take 1 tablet (10 mEq total)  by mouth daily. 03/12/18   [name], DO  predniSONE (DELTASONE) 20 MG tablet Take 1 tablet (20 mg total) by mouth 2 (two) times daily with a meal. Take for 5 days Patient not taking: Reported on 04/10/2018 04/06/18   [name], DO  simvastatin (ZOCOR) 20 MG tablet TAKE 1 TABLET BY MOUTH ONCE DAILY IN THE EVENING Patient taking differently: Take 20 mg by mouth daily at 6 PM.  02/12/18   Wendling, Crosby Oyster, DO  tiZANidine (ZANAFLEX) 4 MG capsule Take 1 capsule (4 mg total) by mouth 3 (three) times daily as needed for muscle spasms. 1 tab po q hs 04/06/18   Wendling, Crosby Oyster, DO  warfarin (COUMADIN) 6 MG tablet Take 6 mg by mouth See admin instructions. Take one tablet (6mg ) on SUN ,WED THUR SAT and SUN. 02/02/18   Wendling, Crosby Oyster, DO  warfarin (COUMADIN) 7.5 MG tablet Take 1 tab on Mondays and Fridays. - hold Today's dose as of 03/02/2018 Patient taking differently: Take 7.5 mg by mouth See admin instructions. 7.5 mg  on Tuesday and friday 03/02/18   Elgergawy, Silver Huguenin, MD    Family History Family History  Problem Relation Age of Onset  . Arthritis Mother   . Heart disease Father     Social History Social History   Tobacco Use  . Smoking status: Former Smoker    Types: Cigarettes    Last attempt to quit: 05/30/1967    Years since quitting: 50.8  . Smokeless tobacco: Never Used  Substance Use Topics  . Alcohol use: Yes    Alcohol/week: 6.0 standard drinks    Types: 6 Glasses of wine per week    Comment: weekly  . Drug use: No     Allergies   Lipitor [atorvastatin] and Nitroglycerin   Review of Systems Review of Systems  Constitutional: Negative for fever.  Gastrointestinal: Negative for abdominal pain.  Musculoskeletal: Positive for arthralgias and neck pain.  All other systems reviewed and are negative.    Physical Exam Updated Vital Signs BP (!) 151/85 (BP Location: Left Arm)   Pulse 60   Temp 97.9 F (36.6 C) (Oral)   Resp 15   Ht 1.727 m (5\' 8" )   Wt 89.4 kg   SpO2 94%   BMI 29.97 kg/m   Physical Exam CONSTITUTIONAL: elderly, no distress HEAD: Normocephalic/atraumatic, no signs of trauma EYES: EOMI/PERRL ENMT: Mucous membranes moist, no visible trauma NECK: cervical collar in place SPINE/BACK:entire spine nontender, No bruising/crepitance/stepoffs noted to spine CV: irregular, murmur noted LUNGS: Lungs are clear to auscultation bilaterally, no apparent distress Chest - no tenderness or deformity ABDOMEN: soft, nontender, no rebound or guarding, bowel sounds noted throughout abdomen GU:no cva tenderness NEURO: Pt is awake/alert/appropriate, moves all extremitiesx4.  No facial droop.   EXTREMITIES: pulses normal/equal, full ROM, abrasion to left arm, tenderness with palpation and ROM of right shoulder, mild tenderness with palpation of pelvis but stable, All other extremities/joints palpated/ranged and nontender SKIN: warm, color normal PSYCH: no abnormalities  of mood noted, alert and oriented to situation   ED Treatments / Results  Labs (all labs ordered are listed, but only abnormal results are displayed) Labs Reviewed  BASIC METABOLIC PANEL - Abnormal; Notable for the following components:      Result Value   CO2 18 (*)    Glucose, Bld 113 (*)    BUN 65 (*)    Creatinine, Ser 2.10 (*)    GFR calc non Af Wyvonnia Lora  27 (*)    GFR calc Af Amer 31 (*)    All other components within normal limits  CBC WITH DIFFERENTIAL/PLATELET - Abnormal; Notable for the following components:   RBC 4.18 (*)    Hemoglobin 12.4 (*)    RDW 16.0 (*)    nRBC 0.4 (*)    Lymphs Abs 0.5 (*)    All other components within normal limits    EKG EKG Interpretation  Date/Time:  Friday April 10 2018 23:32:41 EST Ventricular Rate:  75 PR Interval:    QRS Duration: 152 QT Interval:  412 QTC Calculation: 473 R Axis:   -71 Text Interpretation:  Afib/flut and V-paced complexes No further rhythm analysis attempted due to paced rhythm Right bundle branch block No significant change since last tracing Confirmed by Ripley Fraise [phone]) on 04/10/2018 11:47:38 PM   Radiology Dg Chest 2 View  Result Date: 04/10/2018 CLINICAL DATA:  Fall. EXAM: CHEST - 2 VIEW COMPARISON:  03/18/2018. 03/01/2018. 01/23/2018. 02/11/2017. 08/26/2016. FINDINGS: Prior CABG. AICD in stable position. Stable cardiomegaly. Pulmonary venous congestion. Mild bilateral interstitial prominence noted. Mild component of CHF can not be completely excluded. Underlying chronic interstitial lung disease most likely present. No pleural effusion or pneumothorax. No acute bony abnormality identified. 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 IMPRESSION: 1. Prior CABG. AICD in stable position. Stable cardiomegaly mild pulmonary venous congestion. 2. Mild bilateral interstitial prominence again noted. A component of mild CHF can not be completely excluded. Underlying chronic  interstitial lung disease most likely present. Electronically Signed   By: Marcello Moores  Register   On: 04/10/2018 13:32   Dg Pelvis 1-2 Views  Result Date: 04/11/2018 CLINICAL DATA:  Patient fell today. EXAM: PELVIS - 1-2 VIEW COMPARISON:  None. FINDINGS: Diffuse bone demineralization. No evidence of acute fracture or dislocation. Degenerative changes in the hips. Sacrum is mostly obscured by bowel gas. Vascular calcifications. Calcified phleboliths. IMPRESSION: No acute bony abnormalities. Electronically Signed   By: Lucienne Capers M.D.   On: 04/11/2018 00:26   Dg Shoulder Right  Result Date: 04/11/2018 CLINICAL DATA:  85 year old male with fall and right shoulder pain. EXAM: RIGHT SHOULDER - 2+ VIEW COMPARISON:  Right shoulder radiograph dated 04/10/2018 FINDINGS: There is no acute fracture or dislocation. The bones are osteopenic. Mild arthritic changes. There is elevated appearance of the right humeral head suggestive of chronic rotator cuff injury. The soft tissues are unremarkable. IMPRESSION: No acute fracture or dislocation. Electronically Signed   By: Anner Crete M.D.   On: 04/11/2018 00:08   Dg Shoulder Right  Result Date: 04/10/2018 CLINICAL DATA:  Fall. Pt stated that he fell yesterday. Pt said he slipped fell forward and slid down the wall. Pt stated that he has pain in his entire right shoulder, and also mentioned that he has arthritis in his right shoulder. Pt denied any cp. EXAM: RIGHT SHOULDER - 2+ VIEW COMPARISON:  03/30/2018 FINDINGS: No fracture or bone lesion. Mild narrowing of the inferior glenohumeral joint space. There is widening of the [facility] joint, which may be postsurgical. Patient has a history of shoulder surgery. Skeletal structures are demineralized. Surrounding soft tissues are unremarkable. No change from the prior exam. IMPRESSION: 1. No fracture or dislocation. 2. Widened AC joints likely postsurgical in origin, stable from the prior study. Electronically Signed   By:  Lajean Manes M.D.   On: 04/10/2018 13:32   Ct Head Wo Contrast  Result Date: 04/11/2018 CLINICAL DATA:  Initial evaluation for acute trauma,  fall. EXAM: CT HEAD WITHOUT CONTRAST TECHNIQUE: Contiguous axial images were obtained from the base of the skull through the vertex without intravenous contrast. COMPARISON:  Prior CT from 04/02/2018. FINDINGS: Brain: Atrophy with chronic microvascular ischemic disease, stable. Small chronic left cerebellar infarct again noted. No acute intracranial hemorrhage. No acute large vessel territory infarct. No mass lesion, midline shift or mass effect. No hydrocephalus. No extra-axial fluid collection. Vascular: No asymmetric hyperdense vessel. Extensive atherosclerotic change at the skull base. Aneurysm at the right MCA bifurcation measuring up to 15 mm again noted, stable. Skull: Scalp soft tissues and calvarium within normal limits. Sinuses/Orbits: Globes and orbital soft tissues normal. Chronic mucosal thickening throughout the paranasal sinuses. No air-fluid levels to suggest acute sinusitis. Mastoid air cells remain clear. Other: None. IMPRESSION: 1. No acute intracranial abnormality. 2. Stable head CT with advanced age-related cerebral atrophy with chronic microvascular ischemic disease. 3. 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 Electronically Signed   By: Jeannine Boga M.D.   On: 04/11/2018 00:46   Ct Head Wo Contrast  Result Date: 04/10/2018 CLINICAL DATA:  85 y/o M; 2 unwitnessed falls and altered mental status. EXAM: CT HEAD WITHOUT CONTRAST CT CERVICAL SPINE WITHOUT CONTRAST TECHNIQUE: Multidetector CT imaging of the head and cervical spine was performed following the standard protocol without intravenous contrast. Multiplanar CT image reconstructions of the cervical spine were also generated. COMPARISON:  02/20/2017 CT head. 02/21/2017 CT angiogram head. FINDINGS: CT HEAD FINDINGS Brain: No evidence of acute infarction, hemorrhage, hydrocephalus,  extra-axial collection or mass lesion/mass effect. Stable nonspecific white matter hypodensities compatible with chronic microvascular ischemic changes and stable volume loss of the brain. Very small chronic infarction within the left cerebellar hemisphere is stable. Vascular: Calcific atherosclerosis of carotid siphons and the vertebral arteries. No hyperdense vessel identified. MCA bifurcation aneurysm measures 9.4 mm in the axial plane (series 4, image 17) which is stable in comparison with the prior CT of head. The aneurysm is better characterized on the prior CT angiogram of the head. Skull: Normal. Negative for fracture or focal lesion. Sinuses/Orbits: Multiple mucous retention cysts within the maxillary and sphenoid sinuses. Mild paranasal sinus mucosal thickening. Normal aeration of mastoid air cells. Bilateral intra-ocular lens replacement. Other: None. CT CERVICAL SPINE FINDINGS Alignment: Straightening of cervical lordosis. Minimal C4-5, T1-2, and T2-3 grade 1 anterolisthesis. Skull base and vertebrae: No acute fracture. No primary bone lesion or focal pathologic process. Soft tissues and spinal canal: No prevertebral fluid or swelling. No visible canal hematoma. Disc levels: Advanced cervical spondylosis with multilevel disc and facet degenerative changes. There is right-sided C3-4 erosive facet arthritis. Facet fusion of upper thoracic spine. Uncovertebral and facet hypertrophy results in high-grade bony neural foraminal stenosis at the left C3-4, bilateral C4-5, bilateral C5-6, bilateral C6-7 levels. No high-grade bony spinal canal stenosis. Upper chest: Part solid opacity in the right upper lobe is grossly stable measuring 20 x 20 mm (AP by ML series 9, image 86). Other: Aberrant right subclavian artery. IMPRESSION: CT head: 1. No acute intracranial abnormality or calvarial fracture identified. 2. Stable chronic microvascular ischemic changes and parenchymal volume loss of the brain. 3. Stable small  chronic infarction in left cerebellar hemisphere. 4. Stable left MCA bifurcation aneurysm in comparison with prior CT of head, better characterized on prior CTA of head. CT cervical spine: 1. No acute fracture or dislocation identified. 2. Advanced cervical spondylosis with multilevel bony neural foraminal stenosis. No high-grade bony spinal canal stenosis. 3. Stable part solid opacity in the  right upper lobe. Follow-up as per prior CT of chest recommendations. Electronically Signed   By: Kristine Garbe M.D.   On: 04/10/2018 16:05   Ct Cervical Spine Wo Contrast  Result Date: 04/10/2018 CLINICAL DATA:  85 y/o M; 2 unwitnessed falls and altered mental status. EXAM: CT HEAD WITHOUT CONTRAST CT CERVICAL SPINE WITHOUT CONTRAST TECHNIQUE: Multidetector CT imaging of the head and cervical spine was performed following the standard protocol without intravenous contrast. Multiplanar CT image reconstructions of the cervical spine were also generated. COMPARISON:  02/20/2017 CT head. 02/21/2017 CT angiogram head. FINDINGS: CT HEAD FINDINGS Brain: No evidence of acute infarction, hemorrhage, hydrocephalus, extra-axial collection or mass lesion/mass effect. Stable nonspecific white matter hypodensities compatible with chronic microvascular ischemic changes and stable volume loss of the brain. Very small chronic infarction within the left cerebellar hemisphere is stable. Vascular: Calcific atherosclerosis of carotid siphons and the vertebral arteries. No hyperdense vessel identified. MCA bifurcation aneurysm measures 9.4 mm in the axial plane (series 4, image 17) which is stable in comparison with the prior CT of head. The aneurysm is better characterized on the prior CT angiogram of the head. Skull: Normal. Negative for fracture or focal lesion. Sinuses/Orbits: Multiple mucous retention cysts within the maxillary and sphenoid sinuses. Mild paranasal sinus mucosal thickening. Normal aeration of mastoid air cells.  Bilateral intra-ocular lens replacement. Other: None. CT CERVICAL SPINE FINDINGS Alignment: Straightening of cervical lordosis. Minimal C4-5, T1-2, and T2-3 grade 1 anterolisthesis. Skull base and vertebrae: No acute fracture. No primary bone lesion or focal pathologic process. Soft tissues and spinal canal: No prevertebral fluid or swelling. No visible canal hematoma. Disc levels: Advanced cervical spondylosis with multilevel disc and facet degenerative changes. There is right-sided C3-4 erosive facet arthritis. Facet fusion of upper thoracic spine. Uncovertebral and facet hypertrophy results in high-grade bony neural foraminal stenosis at the left C3-4, bilateral C4-5, bilateral C5-6, bilateral C6-7 levels. No high-grade bony spinal canal stenosis. Upper chest: Part solid opacity in the right upper lobe is grossly stable measuring 20 x 20 mm (AP by ML series 9, image 86). Other: Aberrant right subclavian artery. IMPRESSION: CT head: 1. No acute intracranial abnormality or calvarial fracture identified. 2. Stable chronic microvascular ischemic changes and parenchymal volume loss of the brain. 3. 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 4. Stable left MCA bifurcation aneurysm in comparison with prior CT of head, better characterized on prior CTA of head. CT cervical spine: 1. No acute fracture or dislocation identified. 2. Advanced cervical spondylosis with multilevel bony neural foraminal stenosis. No high-grade bony spinal canal stenosis. 3. Stable part solid opacity in the right upper lobe. Follow-up as per prior CT of chest recommendations. Electronically Signed   By: Kristine Garbe M.D.   On: 04/10/2018 16:05    Procedures Procedures    Medications Ordered in ED Medications  aspirin chewable tablet 81 mg (has no administration in time range)  levETIRAcetam (KEPPRA) tablet 500 mg (has no administration in time range)  levothyroxine (SYNTHROID, LEVOTHROID) tablet 50 mcg  (has no administration in time range)  metoprolol succinate (TOPROL-XL) 24 hr tablet 25 mg (has no administration in time range)  simvastatin (ZOCOR) tablet 20 mg (has no administration in time range)  warfarin (COUMADIN) tablet 7.5 mg (has no administration in time range)     Initial Impression / Assessment and Plan / ED Course  I have reviewed the triage vital signs and the nursing notes.  Pertinent labs & imaging results that were available during  my care of the patient were reviewed by me and considered in my medical decision making (see chart for details).        11:47 PM Patient is an elderly male with multiple medical conditions who presents for fall.  He was just discharged from the ER several hours ago for several falls.  Apparently when he got back to his facility he fell again.  He thinks he hit his head but no LOC is reported.  No new neck pain.  He already had a negative CT head and C-spine earlier in the day, but he is on Coumadin with another reported head injury.  Will obtain CT head.  He also has right shoulder pain which we will image.  Will attempt to call his facility. cspine has been clinically cleared 12:57 AM All imaging is negative for acute traumatic injury. Patient is resting comfortably.  He is still mildly dehydrated, but baseline renal function. Patient reports he lives in an assisted living facility. He lives at [facility].  I have made multiple attempts to call without anybody answering. Patient has had 2 ER visits in the past 24 hours.  He has had at least 3 falls in the past 24 hours. He clearly is high risk for further falls in assisted living facility and on anticoagulation.  At this point I do not feel safe discharging him back to an ALF. Will consult case management/social work in the morning to help with placement in a higher level of care Home medications have been ordered, and I have held his diuretics due to dehydration. Coumadin  has been restarted for another day, but will need to be restarted if he stays for longer than 24 hours Final Clinical Impressions(s) / ED Diagnoses   Final diagnoses:  Fall, initial encounter  Injury of head, initial encounter  Dehydration  Strain of right shoulder, initial encounter  Subtherapeutic international normalized ratio (INR)    ED Discharge Orders    None       Ripley Fraise, MD 04/11/18 0100

## 2018-04-10 NOTE — Telephone Encounter (Signed)
Kenneth Bell at [phone] and spoke to [place] gave PCP ok for them to give the patient his medications. Form will be faxed to our office and PCP will sign.

## 2018-04-11 ENCOUNTER — Emergency Department (HOSPITAL_COMMUNITY): Payer: Medicare Other

## 2018-04-11 DIAGNOSIS — S0990XA Unspecified injury of head, initial encounter: Secondary | ICD-10-CM | POA: Diagnosis not present

## 2018-04-11 DIAGNOSIS — M25511 Pain in right shoulder: Secondary | ICD-10-CM | POA: Diagnosis not present

## 2018-04-11 DIAGNOSIS — T796XXA Traumatic ischemia of muscle, initial encounter: Secondary | ICD-10-CM | POA: Diagnosis not present

## 2018-04-11 DIAGNOSIS — S4991XA Unspecified injury of right shoulder and upper arm, initial encounter: Secondary | ICD-10-CM | POA: Diagnosis not present

## 2018-04-11 DIAGNOSIS — S3993XA Unspecified injury of pelvis, initial encounter: Secondary | ICD-10-CM | POA: Diagnosis not present

## 2018-04-11 DIAGNOSIS — Z743 Need for continuous supervision: Secondary | ICD-10-CM | POA: Diagnosis not present

## 2018-04-11 DIAGNOSIS — R279 Unspecified lack of coordination: Secondary | ICD-10-CM | POA: Diagnosis not present

## 2018-04-11 LAB — BASIC METABOLIC PANEL
Anion gap: 12 (ref 5–15)
BUN: 65 mg/dL — ABNORMAL HIGH (ref 8–23)
CO2: 18 mmol/L — ABNORMAL LOW (ref 22–32)
Calcium: 9.9 mg/dL (ref 8.9–10.3)
Chloride: 107 mmol/L (ref 98–111)
Creatinine, Ser: 2.1 mg/dL — ABNORMAL HIGH (ref 0.61–1.24)
GFR calc Af Amer: 31 mL/min — ABNORMAL LOW (ref >60)
GFR calc non Af Amer: 27 mL/min — ABNORMAL LOW (ref >60)
Glucose, Bld: 113 mg/dL — ABNORMAL HIGH (ref 70–99)
Potassium: 4.5 mmol/L (ref 3.5–5.1)
SODIUM: 137 mmol/L (ref 135–145)

## 2018-04-11 LAB — CBC WITH DIFFERENTIAL/PLATELET
BLASTS: 0 %
Band Neutrophils: 0 %
Basophils Absolute: 0.1 10*3/uL (ref 0.0–0.1)
Basophils Relative: 1 %
Eosinophils Absolute: 0.2 10*3/uL (ref 0.0–0.5)
Eosinophils Relative: 3 %
HCT: 41.4 % (ref 39.0–52.0)
Hemoglobin: 12.4 g/dL — ABNORMAL LOW (ref 13.0–17.0)
LYMPHS ABS: 0.5 10*3/uL — AB (ref 0.7–4.0)
Lymphocytes Relative: 6 %
MCH: 29.7 pg (ref 26.0–34.0)
MCHC: 30 g/dL (ref 30.0–36.0)
MCV: 99 fL (ref 80.0–100.0)
MONOS PCT: 10 %
Metamyelocytes Relative: 0 %
Monocytes Absolute: 0.8 10*3/uL (ref 0.1–1.0)
Myelocytes: 0 %
NEUTROS ABS: 6.4 10*3/uL (ref 1.7–7.7)
Neutrophils Relative %: 80 %
Other: 0 %
Platelets: 213 10*3/uL (ref 150–400)
Promyelocytes Relative: 0 %
RBC: 4.18 MIL/uL — AB (ref 4.22–5.81)
RDW: 16 % — ABNORMAL HIGH (ref 11.5–15.5)
WBC: 8 10*3/uL (ref 4.0–10.5)
nRBC: 0 /100 WBC
nRBC: 0.4 % — ABNORMAL HIGH (ref 0.0–0.2)

## 2018-04-11 MED ORDER — SIMVASTATIN 20 MG PO TABS: 20.0000 mg | ORAL_TABLET | Freq: Every evening | ORAL | Status: DC

## 2018-04-11 MED ORDER — LEVOTHYROXINE SODIUM 50 MCG PO TABS
50.0000 ug | ORAL_TABLET | Freq: Every day | ORAL | Status: DC
  Administered 2018-04-11: 50 ug via ORAL
  Filled 2018-04-11: qty 1

## 2018-04-11 MED ORDER — WARFARIN SODIUM 7.5 MG PO TABS
7.5000 mg | ORAL_TABLET | Freq: Once | ORAL | Status: AC
  Administered 2018-04-11: 7.5 mg via ORAL
  Filled 2018-04-11: qty 1

## 2018-04-11 MED ORDER — METOPROLOL SUCCINATE ER 25 MG PO TB24
25.0000 mg | ORAL_TABLET | Freq: Every day | ORAL | Status: DC
  Administered 2018-04-11: 25 mg via ORAL
  Filled 2018-04-11: qty 1

## 2018-04-11 MED ORDER — ASPIRIN 81 MG PO CHEW
81.0000 mg | CHEWABLE_TABLET | Freq: Every day | ORAL | Status: DC
  Administered 2018-04-11: 81 mg via ORAL
  Filled 2018-04-11: qty 1

## 2018-04-11 MED ORDER — LEVETIRACETAM 500 MG PO TABS
500.0000 mg | ORAL_TABLET | Freq: Two times a day (BID) | ORAL | Status: DC
  Administered 2018-04-11 (×2): 500 mg via ORAL
  Filled 2018-04-11 (×2): qty 1

## 2018-04-11 NOTE — Progress Notes (Signed)
CSW met with patient in response to consult request. CSW noted per patient he had fallen twice in the past two days and stated "I don't know why." CSW noted patient was in the middle of breakfast and will follow up with patient soon to discuss falling and placement.  Lamonte Richer, LCSW, [place] [facility] [phone]

## 2018-04-11 NOTE — ED Notes (Signed)
Pt tolerated 2 person assist and wheelchair for bathroom assistance.

## 2018-04-11 NOTE — ED Notes (Signed)
Breakfast Tray Ordered. 

## 2018-04-11 NOTE — ED Notes (Signed)
Pt's Daughter 2 hours to the [place] in [place]. [facility] [phone]

## 2018-04-11 NOTE — ED Notes (Signed)
Called ptar for pt transport back to [facility]

## 2018-04-11 NOTE — Progress Notes (Signed)
ED CM met with patient in H-17 to discuss care transition plan. Patient alert and oriented x3, he reports being a resident at [facility], and actively receiving PT 3 times per week. ED evaluation no signs of acute changes. Patient is wanting to return to continue [facility] PT.  CM contacted Nicole Kindred at [facility] to inform facility of patient's return with resumption of [facility] services. Updated EDP Dr. Ralene Bathe who is agreeable with plan.

## 2018-04-13 ENCOUNTER — Telehealth: Payer: Self-pay | Admitting: *Deleted

## 2018-04-13 ENCOUNTER — Other Ambulatory Visit: Payer: Self-pay | Admitting: Family Medicine

## 2018-04-13 NOTE — Telephone Encounter (Signed)
He is having shoulder pain and states his medications were on his bathroom counter but has not been able to find them,.. Spoke to the patient and he is somewhat confused as to why he has been seen twice in the ED. He also stated he was aware that Kenneth Bell will be in the future giving him his medications per PCP instructions. .  Does the patient need an Hospital followup///if so will need to contact his daughter.

## 2018-04-13 NOTE — Telephone Encounter (Signed)
Patient daughter Kenneth Bell called and would like to talk to Dr. Nani Ravens or Shirlean Mylar about his medication refill of prednisone. She stated that patient has been out of it for 3 days and needs to talk his medication today. Please call her back, thanks.

## 2018-04-13 NOTE — Telephone Encounter (Signed)
Sent in prednisone as PCP made aware and is ok to do. Spoke to Wil[place] and she will pickup and get to the patient.

## 2018-04-13 NOTE — Telephone Encounter (Signed)
Received Physician Orders from [facility]; forwarded to provider/SLS 03/02

## 2018-04-13 NOTE — Telephone Encounter (Signed)
For? Neck/arm pain?

## 2018-04-14 ENCOUNTER — Telehealth: Payer: Self-pay | Admitting: Family Medicine

## 2018-04-14 NOTE — Telephone Encounter (Signed)
Copied from [place] [phone]. Topic: General - Other >> Apr 14, 2018  1:37 PM Lennox Solders wrote: Reason for CRM: angie rn  from [facility] is calling an needs a order for prednisone to be administer. Please fax to [phone]

## 2018-04-15 ENCOUNTER — Telehealth: Payer: Self-pay | Admitting: *Deleted

## 2018-04-15 ENCOUNTER — Telehealth: Payer: Self-pay | Admitting: Family Medicine

## 2018-04-15 ENCOUNTER — Ambulatory Visit: Payer: Self-pay | Admitting: *Deleted

## 2018-04-15 NOTE — Telephone Encounter (Signed)
Copied from [place] [phone]. Topic: General - Other >> Apr 14, 2018 12:56 PM Leward Quan A wrote: Reason for CRM: [facility] called asking for form to be faxed back to Fax# [phone] please. Form done

## 2018-04-15 NOTE — Telephone Encounter (Signed)
OK. Give 2 tabs by mouth daily for 5 days.  TY.

## 2018-04-15 NOTE — Telephone Encounter (Signed)
[place], RN called from [facility] regarding pt not wanting to take his b/p medication. He has taken the rest. He is stating that he is in pain. She is requesting an order for Tylenol and for his muscle relaxer. His B/p is 135/103 hr 64. Advised her try to get him to take his b/p medication for today. She asked about his prednisone and per med list, he should take 1 tab by mouth twice a day with meals for 5 days.  She is faxing over an order sheet to for his medications.  Please review medications.

## 2018-04-15 NOTE — Telephone Encounter (Signed)
RN informed of ok and will fax over paper order to be signed and faxed back.

## 2018-04-15 NOTE — Telephone Encounter (Signed)
Received Physician Fax Report of Fall from [facility]; forwarded to provider/SLS 03/04

## 2018-04-16 ENCOUNTER — Telehealth: Payer: Self-pay | Admitting: Family Medicine

## 2018-04-16 ENCOUNTER — Other Ambulatory Visit: Payer: Self-pay | Admitting: Family Medicine

## 2018-04-16 ENCOUNTER — Telehealth: Payer: Self-pay | Admitting: *Deleted

## 2018-04-16 MED ORDER — METOLAZONE 2.5 MG PO TABS: 2.5000 mg | ORAL_TABLET | ORAL | 2 refills | Status: DC

## 2018-04-16 NOTE — Telephone Encounter (Signed)
Received Physician Orders for Prednisone from [facility]; forwarded to provider/SLS 03/05

## 2018-04-16 NOTE — Telephone Encounter (Signed)
Reviewed orders, they are correct.

## 2018-04-16 NOTE — Telephone Encounter (Signed)
Received Physician Orders [x2] from [facility]; forwarded to provider/SLS 03/05

## 2018-04-16 NOTE — Telephone Encounter (Signed)
Copied from [place] [id number]. Topic: Quick Communication - See Telephone Encounter >> Apr 16, 2018  3:42 PM Sheran Luz wrote: CRM for notification. See Telephone encounter for: 04/16/18.  [place], with [facility], requesting patients current medication list be faxed.   [place]# [phone] [facility] fax# [phone]

## 2018-04-17 NOTE — Telephone Encounter (Signed)
Printed medication list and faxed to [facility]. [place] at [phone]

## 2018-04-19 ENCOUNTER — Emergency Department (HOSPITAL_COMMUNITY): Payer: Medicare Other

## 2018-04-19 ENCOUNTER — Inpatient Hospital Stay (HOSPITAL_COMMUNITY)
Admission: EM | Admit: 2018-04-19 | Discharge: 2018-04-24 | DRG: 291 | Disposition: A | Discharge status: Skilled Nursing Facility | Payer: Medicare Other | Attending: Internal Medicine | Admitting: Internal Medicine

## 2018-04-19 ENCOUNTER — Encounter (HOSPITAL_COMMUNITY): Payer: Self-pay | Admitting: Internal Medicine

## 2018-04-19 DIAGNOSIS — Z7901 Long term (current) use of anticoagulants: Secondary | ICD-10-CM

## 2018-04-19 DIAGNOSIS — J101 Influenza due to other identified influenza virus with other respiratory manifestations: Secondary | ICD-10-CM | POA: Diagnosis present

## 2018-04-19 DIAGNOSIS — Z8719 Personal history of other diseases of the digestive system: Secondary | ICD-10-CM

## 2018-04-19 DIAGNOSIS — R410 Disorientation, unspecified: Secondary | ICD-10-CM | POA: Diagnosis not present

## 2018-04-19 DIAGNOSIS — Z8261 Family history of arthritis: Secondary | ICD-10-CM

## 2018-04-19 DIAGNOSIS — R0902 Hypoxemia: Secondary | ICD-10-CM | POA: Diagnosis not present

## 2018-04-19 DIAGNOSIS — I2510 Atherosclerotic heart disease of native coronary artery without angina pectoris: Secondary | ICD-10-CM | POA: Diagnosis present

## 2018-04-19 DIAGNOSIS — I350 Nonrheumatic aortic (valve) stenosis: Secondary | ICD-10-CM | POA: Diagnosis present

## 2018-04-19 DIAGNOSIS — R791 Abnormal coagulation profile: Secondary | ICD-10-CM | POA: Diagnosis present

## 2018-04-19 DIAGNOSIS — R778 Other specified abnormalities of plasma proteins: Secondary | ICD-10-CM

## 2018-04-19 DIAGNOSIS — M25511 Pain in right shoulder: Secondary | ICD-10-CM | POA: Diagnosis present

## 2018-04-19 DIAGNOSIS — I5043 Acute on chronic combined systolic (congestive) and diastolic (congestive) heart failure: Secondary | ICD-10-CM | POA: Diagnosis present

## 2018-04-19 DIAGNOSIS — I671 Cerebral aneurysm, nonruptured: Secondary | ICD-10-CM | POA: Diagnosis present

## 2018-04-19 DIAGNOSIS — I5023 Acute on chronic systolic (congestive) heart failure: Secondary | ICD-10-CM | POA: Diagnosis not present

## 2018-04-19 DIAGNOSIS — J811 Chronic pulmonary edema: Secondary | ICD-10-CM | POA: Diagnosis not present

## 2018-04-19 DIAGNOSIS — Z955 Presence of coronary angioplasty implant and graft: Secondary | ICD-10-CM

## 2018-04-19 DIAGNOSIS — Z95810 Presence of automatic (implantable) cardiac defibrillator: Secondary | ICD-10-CM | POA: Diagnosis present

## 2018-04-19 DIAGNOSIS — N183 Chronic kidney disease, stage 3 unspecified: Secondary | ICD-10-CM | POA: Diagnosis present

## 2018-04-19 DIAGNOSIS — I4891 Unspecified atrial fibrillation: Secondary | ICD-10-CM | POA: Diagnosis not present

## 2018-04-19 DIAGNOSIS — R5381 Other malaise: Secondary | ICD-10-CM | POA: Diagnosis not present

## 2018-04-19 DIAGNOSIS — I25810 Atherosclerosis of coronary artery bypass graft(s) without angina pectoris: Secondary | ICD-10-CM | POA: Diagnosis present

## 2018-04-19 DIAGNOSIS — I509 Heart failure, unspecified: Secondary | ICD-10-CM

## 2018-04-19 DIAGNOSIS — I442 Atrioventricular block, complete: Secondary | ICD-10-CM | POA: Diagnosis present

## 2018-04-19 DIAGNOSIS — Z888 Allergy status to other drugs, medicaments and biological substances status: Secondary | ICD-10-CM

## 2018-04-19 DIAGNOSIS — J449 Chronic obstructive pulmonary disease, unspecified: Secondary | ICD-10-CM | POA: Diagnosis not present

## 2018-04-19 DIAGNOSIS — Z87891 Personal history of nicotine dependence: Secondary | ICD-10-CM

## 2018-04-19 DIAGNOSIS — J440 Chronic obstructive pulmonary disease with acute lower respiratory infection: Secondary | ICD-10-CM | POA: Diagnosis present

## 2018-04-19 DIAGNOSIS — I4819 Other persistent atrial fibrillation: Secondary | ICD-10-CM | POA: Diagnosis present

## 2018-04-19 DIAGNOSIS — S0990XA Unspecified injury of head, initial encounter: Secondary | ICD-10-CM | POA: Diagnosis not present

## 2018-04-19 DIAGNOSIS — J9601 Acute respiratory failure with hypoxia: Secondary | ICD-10-CM | POA: Diagnosis present

## 2018-04-19 DIAGNOSIS — R52 Pain, unspecified: Secondary | ICD-10-CM

## 2018-04-19 DIAGNOSIS — D631 Anemia in chronic kidney disease: Secondary | ICD-10-CM | POA: Diagnosis present

## 2018-04-19 DIAGNOSIS — E039 Hypothyroidism, unspecified: Secondary | ICD-10-CM | POA: Diagnosis present

## 2018-04-19 DIAGNOSIS — I130 Hypertensive heart and chronic kidney disease with heart failure and stage 1 through stage 4 chronic kidney disease, or unspecified chronic kidney disease: Principal | ICD-10-CM | POA: Diagnosis present

## 2018-04-19 DIAGNOSIS — R4020 Unspecified coma: Secondary | ICD-10-CM | POA: Diagnosis not present

## 2018-04-19 DIAGNOSIS — Z8249 Family history of ischemic heart disease and other diseases of the circulatory system: Secondary | ICD-10-CM

## 2018-04-19 DIAGNOSIS — Z79890 Hormone replacement therapy (postmenopausal): Secondary | ICD-10-CM

## 2018-04-19 DIAGNOSIS — Z7982 Long term (current) use of aspirin: Secondary | ICD-10-CM

## 2018-04-19 DIAGNOSIS — I255 Ischemic cardiomyopathy: Secondary | ICD-10-CM | POA: Diagnosis present

## 2018-04-19 DIAGNOSIS — T45515A Adverse effect of anticoagulants, initial encounter: Secondary | ICD-10-CM | POA: Diagnosis present

## 2018-04-19 DIAGNOSIS — M2550 Pain in unspecified joint: Secondary | ICD-10-CM | POA: Diagnosis not present

## 2018-04-19 DIAGNOSIS — Z950 Presence of cardiac pacemaker: Secondary | ICD-10-CM

## 2018-04-19 DIAGNOSIS — E785 Hyperlipidemia, unspecified: Secondary | ICD-10-CM | POA: Diagnosis present

## 2018-04-19 DIAGNOSIS — I5021 Acute systolic (congestive) heart failure: Secondary | ICD-10-CM | POA: Diagnosis not present

## 2018-04-19 DIAGNOSIS — I252 Old myocardial infarction: Secondary | ICD-10-CM

## 2018-04-19 DIAGNOSIS — R1311 Dysphagia, oral phase: Secondary | ICD-10-CM | POA: Diagnosis not present

## 2018-04-19 DIAGNOSIS — R2689 Other abnormalities of gait and mobility: Secondary | ICD-10-CM | POA: Diagnosis not present

## 2018-04-19 DIAGNOSIS — M6281 Muscle weakness (generalized): Secondary | ICD-10-CM | POA: Diagnosis not present

## 2018-04-19 DIAGNOSIS — Z79899 Other long term (current) drug therapy: Secondary | ICD-10-CM

## 2018-04-19 DIAGNOSIS — R609 Edema, unspecified: Secondary | ICD-10-CM | POA: Diagnosis not present

## 2018-04-19 DIAGNOSIS — R2681 Unsteadiness on feet: Secondary | ICD-10-CM | POA: Diagnosis not present

## 2018-04-19 DIAGNOSIS — Z7401 Bed confinement status: Secondary | ICD-10-CM | POA: Diagnosis not present

## 2018-04-19 DIAGNOSIS — R7989 Other specified abnormal findings of blood chemistry: Secondary | ICD-10-CM | POA: Diagnosis not present

## 2018-04-19 DIAGNOSIS — I10 Essential (primary) hypertension: Secondary | ICD-10-CM | POA: Diagnosis present

## 2018-04-19 DIAGNOSIS — W19XXXA Unspecified fall, initial encounter: Secondary | ICD-10-CM | POA: Diagnosis not present

## 2018-04-19 DIAGNOSIS — R41841 Cognitive communication deficit: Secondary | ICD-10-CM | POA: Diagnosis not present

## 2018-04-19 DIAGNOSIS — R531 Weakness: Secondary | ICD-10-CM | POA: Diagnosis not present

## 2018-04-19 LAB — COMPREHENSIVE METABOLIC PANEL
ALT: 25 U/L (ref 0–44)
AST: 37 U/L (ref 15–41)
Albumin: 3.2 g/dL — ABNORMAL LOW (ref 3.5–5.0)
Alkaline Phosphatase: 63 U/L (ref 38–126)
Anion gap: 11 (ref 5–15)
BUN: 52 mg/dL — ABNORMAL HIGH (ref 8–23)
CO2: 20 mmol/L — ABNORMAL LOW (ref 22–32)
Calcium: 9.9 mg/dL (ref 8.9–10.3)
Chloride: 104 mmol/L (ref 98–111)
Creatinine, Ser: 2.06 mg/dL — ABNORMAL HIGH (ref 0.61–1.24)
GFR calc Af Amer: 32 mL/min — ABNORMAL LOW (ref >60)
GFR calc non Af Amer: 28 mL/min — ABNORMAL LOW (ref >60)
Glucose, Bld: 132 mg/dL — ABNORMAL HIGH (ref 70–99)
Potassium: 4.3 mmol/L (ref 3.5–5.1)
Sodium: 135 mmol/L (ref 135–145)
Total Bilirubin: 1.3 mg/dL — ABNORMAL HIGH (ref 0.3–1.2)
Total Protein: 6.7 g/dL (ref 6.5–8.1)

## 2018-04-19 LAB — URINALYSIS, ROUTINE W REFLEX MICROSCOPIC
Bacteria, UA: NONE SEEN
Bilirubin Urine: NEGATIVE
Glucose, UA: NEGATIVE mg/dL
Hgb urine dipstick: NEGATIVE
Ketones, ur: NEGATIVE mg/dL
Leukocytes,Ua: NEGATIVE
Nitrite: NEGATIVE
Protein, ur: 30 mg/dL — AB
Specific Gravity, Urine: 1.014 (ref 1.005–1.030)
pH: 5 (ref 5.0–8.0)

## 2018-04-19 LAB — CBC WITH DIFFERENTIAL/PLATELET
Abs Immature Granulocytes: 0.08 10*3/uL — ABNORMAL HIGH (ref 0.00–0.07)
Basophils Absolute: 0 10*3/uL (ref 0.0–0.1)
Basophils Relative: 0 %
Eosinophils Absolute: 0 10*3/uL (ref 0.0–0.5)
Eosinophils Relative: 0 %
HCT: 37.6 % — ABNORMAL LOW (ref 39.0–52.0)
Hemoglobin: 11.2 g/dL — ABNORMAL LOW (ref 13.0–17.0)
Immature Granulocytes: 2 %
LYMPHS PCT: 6 %
Lymphs Abs: 0.3 10*3/uL — ABNORMAL LOW (ref 0.7–4.0)
MCH: 29.2 pg (ref 26.0–34.0)
MCHC: 29.8 g/dL — ABNORMAL LOW (ref 30.0–36.0)
MCV: 97.9 fL (ref 80.0–100.0)
Monocytes Absolute: 0.4 10*3/uL (ref 0.1–1.0)
Monocytes Relative: 7 %
Neutro Abs: 4.5 10*3/uL (ref 1.7–7.7)
Neutrophils Relative %: 85 %
Platelets: 179 10*3/uL (ref 150–400)
RBC: 3.84 MIL/uL — AB (ref 4.22–5.81)
RDW: 17 % — ABNORMAL HIGH (ref 11.5–15.5)
WBC: 5.3 10*3/uL (ref 4.0–10.5)
nRBC: 0.4 % — ABNORMAL HIGH (ref 0.0–0.2)

## 2018-04-19 LAB — PROTIME-INR
INR: 3.1 — ABNORMAL HIGH (ref 0.8–1.2)
Prothrombin Time: 31.2 seconds — ABNORMAL HIGH (ref 11.4–15.2)

## 2018-04-19 LAB — INFLUENZA PANEL BY PCR (TYPE A & B)
Influenza A By PCR: POSITIVE — AB
Influenza B By PCR: NEGATIVE

## 2018-04-19 LAB — LACTIC ACID, PLASMA
Lactic Acid, Venous: 1.9 mmol/L (ref 0.5–1.9)
Lactic Acid, Venous: 2.4 mmol/L (ref 0.5–1.9)

## 2018-04-19 LAB — LIPASE, BLOOD: Lipase: 15 U/L (ref 11–51)

## 2018-04-19 LAB — I-STAT TROPONIN, ED: Troponin i, poc: 0.34 ng/mL (ref 0.00–0.08)

## 2018-04-19 LAB — TSH: TSH: 1.586 u[IU]/mL (ref 0.350–4.500)

## 2018-04-19 LAB — BRAIN NATRIURETIC PEPTIDE: B Natriuretic Peptide: 1468.4 pg/mL — ABNORMAL HIGH (ref 0.0–100.0)

## 2018-04-19 LAB — T4, FREE: Free T4: 1.42 ng/dL (ref 0.82–1.77)

## 2018-04-19 MED ORDER — OSELTAMIVIR PHOSPHATE 30 MG PO CAPS
30.0000 mg | ORAL_CAPSULE | Freq: Once | ORAL | Status: AC
  Administered 2018-04-19: 30 mg via ORAL
  Filled 2018-04-19: qty 1

## 2018-04-19 MED ORDER — SODIUM CHLORIDE 0.9 % IV BOLUS
1000.0000 mL | Freq: Once | INTRAVENOUS | Status: AC
  Administered 2018-04-19: 1000 mL via INTRAVENOUS

## 2018-04-19 MED ORDER — SODIUM CHLORIDE 0.9% FLUSH: 3.0000 mL | Freq: Once | INTRAVENOUS | Status: DC

## 2018-04-19 NOTE — ED Notes (Signed)
Pt takes coumadin

## 2018-04-19 NOTE — ED Notes (Signed)
[phone]

## 2018-04-19 NOTE — ED Notes (Signed)
Pt. Asked if able to provide urine sample at this time. Pt. Stated he did not have to go just yet.

## 2018-04-19 NOTE — ED Notes (Signed)
Please call Patient Daughter for update: Kenneth Bell

## 2018-04-19 NOTE — ED Provider Notes (Signed)
[facility] EMERGENCY DEPARTMENT Provider Note   CSN: [id number] Arrival date & time: 04/19/18  1703    History   Chief Complaint Chief Complaint  Patient presents with  . Weakness  . Fall    HPI Kenneth Bell is a 85 y.o. male.     85 yo M with a chief complaint of generalized weakness.  The patient has had worsening weakness over the past few weeks and has had multiple falls.  Has been seen in the ED twice for this in the past couple weeks.  Had another episode today where he slid out of her wheelchair.  Per report the patient had been wheelchair-bound for the past week but had been walking previously.  The patient seems somewhat confused on my exam, he has difficulty providing further history.  States he has had a mild cough feels that his legs are more swollen than normal.  Level 5 caveat altered mental status.  The history is provided by the patient.  Weakness  Associated symptoms: cough   Associated symptoms: no abdominal pain, no arthralgias, no chest pain, no diarrhea, no fever, no headaches, no myalgias, no shortness of breath and no vomiting   Fall  Pertinent negatives include no chest pain, no abdominal pain, no headaches and no shortness of breath.  Illness  Severity:  Moderate Onset quality:  Gradual Duration:  2 weeks Timing:  Constant Progression:  Worsening Chronicity:  New Associated symptoms: congestion, cough and fatigue   Associated symptoms: no abdominal pain, no chest pain, no diarrhea, no fever, no headaches, no myalgias, no rash, no shortness of breath and no vomiting     Past Medical History:  Diagnosis Date  . Brain aneurysm   . Chronic combined systolic and diastolic heart failure, NYHA class 3 (HCC)    EF has seemed to vary report-to-report. Myoview 2007 showed EF 30%, echo in 2016 7 EF 60-65% -> Echo in 08/2015 & 08/2016  40-45%  (with basal-mid inferoseptal & apical inferolateral wall), Mod P HTN (PAP ~53 mHg) --most recent  in January 2019 says EF 45-50%.  . CKD (chronic kidney disease), stage III ([place]) 08/26/2016  . COPD (chronic obstructive pulmonary disease) ([place])   . Coronary artery disease involving native heart with angina pectoris ([place])    a. MI 2002 - PCI to [place]. b. 2005: PCI to LAD, OM1 & OM2.  c. s/p CABG in 2013: LIMA to LAD, SVG to OM2, SVG to PDA and SVG to RI. ; d. Cath 2017: occluded LAD, OM1 & OM2, SVG-RI with patent LIMA-LAD, SVG-rPDA & SVG-OM2.   . Essential hypertension   . Hyperlipidemia with target LDL less than 70   . Moderate pulmonary arterial systolic hypertension (HCC) 08/28/2016   PA pressures F has been estimated at 53-59 mmHg on echo-   . Moderate to severe aortic stenosis 08/28/2016   Stenosis with mild regurgitation.  . Persistent atrial fibrillation   . Presence of biventricular implantable cardioverter-defibrillator (ICD) 2008  . Thyroid disease     Patient Active Problem List   Diagnosis Date Noted  . Cervical stenosis of spinal canal 04/06/2018  . Chest pain 03/01/2018  . CHF (congestive heart failure) ([place]) 01/25/2018  . Acute respiratory failure with hypoxemia ([place]) 01/24/2018  . Acute on chronic systolic heart failure ([place]) 01/23/2018  . Pain in both forearms 01/14/2018  . Cerebral thrombosis with cerebral infarction 02/21/2017  . Cerebral embolism with cerebral infarction 02/21/2017  . Subarachnoid hemorrhage 02/21/2017  . Confusion  02/20/2017  . Brain aneurysm   . Thyroid disease   . Hyperlipidemia with target LDL less than 70   . Essential hypertension   . Coronary artery disease involving native heart with angina pectoris ([place])   . COPD (chronic obstructive pulmonary disease) ([place])   . Aortic stenosis, mild 09/13/2016  . Ischemic cardiomyopathy 08/28/2016  . Moderate pulmonary arterial systolic hypertension ([place]) 08/28/2016  . Mild aortic insufficiency 08/28/2016  . Elevated troponin   . Chronic combined systolic and diastolic heart failure, NYHA class 3  ([place]) 08/26/2016  . CKD (chronic kidney disease), stage III ([place]) 08/26/2016  . Persistent atrial fibrillation ([place]); CHA2DS2Vasc = ~4 (CHF, MI/aortic plaque, Age 35) 08/21/2016  . Biventricular ICD (implantable cardioverter-defibrillator) in place 09/13/2012  . Coronary artery disease involving coronary bypass graft of native heart with angina pectoris ([place]) 08/26/2012  . Presence of biventricular implantable cardioverter-defibrillator (ICD) 02/11/2006    Past Surgical History:  Procedure Laterality Date  . APPENDECTOMY    . CARDIAC CATHETERIZATION  06/2015   Left main patent. Diffuse LAD disease with competitive flow and distal LAD from LIMA. Mid Cx 35% with occlusion of OM 1 and 90% dCx. OM 2 occluded (perfuse via SVG). Ostial RCA 70%, dRCA 100%.  Patent LIMA-LAD. Widely patent SVG-OM, SVG-PDA: 40% proximal. 100% proximal SVG-RI  . CARDIAC SURGERY    . CARPAL TUNNEL RELEASE Right 2017  . COLONOSCOPY  09/05/2012   Colon Polyps  . CORONARY ANGIOPLASTY WITH STENT PLACEMENT     Prior PCI to OM 2; 2005 PCI to LAD and OM1 with a Taxus DES 2.5 x 16 mm (postdilated to 3.8 mm) and PTCA of OM 2 stent  . CORONARY ARTERY BYPASS GRAFT  2013   LIMA-LAD, SVG-L1-2, SVG-PDA, SVG-RI (known occlusion of SVG RI)  . KNEE SURGERY Bilateral 1999  . NM MYOVIEW LTD  04/2015   Prior lateral infarct with no scanning. EF 31% with moderate global HK  . PROSTATE SURGERY    . SHOULDER SURGERY Bilateral    Twice  . TRANSTHORACIC ECHOCARDIOGRAM  08/2015   Moderate LV dysfunction with global HK (estimated at 40 and 45%). Worse anterolateral and inferolateral wall.  . TRANSTHORACIC ECHOCARDIOGRAM  08/2016   EF 40-45%. Moderate concentric LVH. Akinesis of basal-mid inferoseptal and apical inferolateral wall.  Aortic sclerosis, no stenosis. Moderate mitral thickening with mild MR. Moderate RV dilation. Moderate TR and PR. PA pressures 53 mmHg  . TRANSTHORACIC ECHOCARDIOGRAM  02/2017    EF 45-50% (similar to last).   Apical-inferolateral akinesis.  Severe focal basal and moderate concentric LVH.  Mild aortic stenosis.  Elevated PA pressures with peak pressures estimated 59 mmHg (moderate PA hypertension)  . VEIN BYPASS SURGERY  2013        Home Medications    Prior to Admission medications   Medication Sig Start Date End Date Taking? Authorizing Provider  aspirin 81 MG chewable tablet Chew 81 mg by mouth daily.   Yes [provider]  bumetanide (BUMEX) 2 MG tablet Take 2 mg by mouth See admin instructions. Take one tablet (2 mg) by mouth daily for fluid retention, may have extra dose (2 mg) if weight gain is greater than 8 lbs. 02/15/18  Yes [provider]  cyanocobalamin 1000 MCG tablet Take 1 tablet (1,000 mcg total) by mouth daily. 03/12/18  Yes [name], DO  levETIRAcetam (KEPPRA) 500 MG tablet TAKE 1 TABLET BY MOUTH TWICE DAILY Patient taking differently: Take 500 mg by mouth 2 (two) times  daily.  03/05/18  Yes Wendling, Crosby Oyster, DO  levothyroxine (SYNTHROID, LEVOTHROID) 50 MCG tablet TAKE 1 TABLET BY MOUTH ONCE DAILY Patient taking differently: Take 50 mcg by mouth daily before breakfast.  02/19/18  Yes Wendling, Crosby Oyster, DO  metolazone (ZAROXOLYN) 2.5 MG tablet Take 1 tablet (2.5 mg total) by mouth every 7 (seven) days. Patient taking differently: Take 2.5 mg by mouth every Monday.  04/16/18  Yes [name], DO  metoprolol succinate (TOPROL-XL) 25 MG 24 hr tablet Take 1.5 tablets (37.5 mg total) by mouth daily. Patient taking differently: Take 25 mg by mouth daily.  03/17/18  Yes Croitoru, Mihai, MD  omeprazole (PRILOSEC) 20 MG capsule TAKE 1 CAPSULE BY MOUTH ONCE DAILY Patient taking differently: Take 20 mg by mouth daily before breakfast.  08/04/17  Yes Wendling, Crosby Oyster, DO  potassium chloride (K-DUR) 10 MEQ tablet Take 1 tablet (10 mEq total) by mouth daily. 03/12/18  Yes Wendling, Crosby Oyster, DO  predniSONE (DELTASONE) 20 MG tablet TAKE  1 TABLET BY MOUTH TWICE DAILY WITH MEALS FOR 5 DAYS Patient taking differently: Take 20 mg by mouth daily. 5 day course stared 04/18/2018 04/13/18  Yes Wendling, Crosby Oyster, DO  simvastatin (ZOCOR) 20 MG tablet TAKE 1 TABLET BY MOUTH ONCE DAILY IN THE EVENING Patient taking differently: Take 20 mg by mouth at bedtime.  02/12/18  Yes Wendling, Crosby Oyster, DO  tiZANidine (ZANAFLEX) 4 MG capsule Take 1 capsule (4 mg total) by mouth 3 (three) times daily as needed for muscle spasms. 1 tab po q hs Patient taking differently: Take 4 mg by mouth See admin instructions. Take one capsule (4 mg) by mouth daily at bedtime, may also take one capsule (4 mg) every 8 hours as needed for muscle spasms. 04/06/18  Yes [name], DO  warfarin (COUMADIN) 6 MG tablet Take 6 mg by mouth See admin instructions. Take one tablet (6 mg) by mouth on Sunday, Tuesday, Wednesday, Thursday, Saturday evenings 02/02/18  Yes [name], DO  warfarin (COUMADIN) 7.5 MG tablet Take 1 tab on Mondays and Fridays. - hold Today's dose as of 03/02/2018 Patient taking differently: Take 7.5 mg by mouth See admin instructions. Take one tablet (7.5 mg) by mouth on Monday and Friday evenings 03/02/18  Yes Elgergawy, Silver Huguenin, MD    Family History Family History  Problem Relation Age of Onset  . Arthritis Mother   . Heart disease Father     Social History Social History   Tobacco Use  . Smoking status: Former Smoker    Types: Cigarettes    Last attempt to quit: 05/30/1967    Years since quitting: 50.9  . Smokeless tobacco: Never Used  Substance Use Topics  . Alcohol use: Yes    Alcohol/week: 6.0 standard drinks    Types: 6 Glasses of wine per week    Comment: weekly  . Drug use: No     Allergies   Lipitor [atorvastatin] and Nitroglycerin   Review of Systems Review of Systems  Constitutional: Positive for fatigue. Negative for chills and fever.  HENT: Positive for congestion. Negative for facial  swelling.   Eyes: Negative for discharge and visual disturbance.  Respiratory: Positive for cough. Negative for shortness of breath.   Cardiovascular: Negative for chest pain and palpitations.  Gastrointestinal: Negative for abdominal pain, diarrhea and vomiting.  Musculoskeletal: Negative for arthralgias and myalgias.  Skin: Negative for color change and rash.  Neurological: Positive for weakness. Negative for tremors, syncope  and headaches.  Psychiatric/Behavioral: Negative for confusion and dysphoric mood.     Physical Exam Updated Vital Signs BP (!) 147/94   Pulse 63   Temp 98.9 F (37.2 C) (Rectal)   Resp 13   SpO2 96%   Physical Exam Vitals signs and nursing note reviewed.  Constitutional:      Appearance: He is well-developed.  HENT:     Head: Normocephalic and atraumatic.     Comments: Swollen turbinates, posterior nasal drip    Nose: Congestion present.  Eyes:     Pupils: Pupils are equal, round, and reactive to light.  Neck:     Musculoskeletal: Normal range of motion and neck supple.     Vascular: No JVD.  Cardiovascular:     Rate and Rhythm: Normal rate and regular rhythm.     Heart sounds: No murmur. No friction rub. No gallop.   Pulmonary:     Effort: No respiratory distress.     Breath sounds: No wheezing.  Abdominal:     General: There is no distension.     Tenderness: There is no guarding or rebound.  Musculoskeletal: Normal range of motion.     Right lower leg: Edema (4+) present.     Left lower leg: Edema ( 4+) present.  Skin:    Coloration: Skin is not pale.     Findings: No rash.  Neurological:     Mental Status: He is alert and oriented to person, place, and time.  Psychiatric:        Behavior: Behavior normal.      ED Treatments / Results  Labs (all labs ordered are listed, but only abnormal results are displayed) Labs Reviewed  COMPREHENSIVE METABOLIC PANEL - Abnormal; Notable for the following components:      Result Value   CO2  20 (*)    Glucose, Bld 132 (*)    BUN 52 (*)    Creatinine, Ser 2.06 (*)    Albumin 3.2 (*)    Total Bilirubin 1.3 (*)    GFR calc non Af Amer 28 (*)    GFR calc Af Amer 32 (*)    All other components within normal limits  CBC WITH DIFFERENTIAL/PLATELET - Abnormal; Notable for the following components:   RBC 3.84 (*)    Hemoglobin 11.2 (*)    HCT 37.6 (*)    MCHC 29.8 (*)    RDW 17.0 (*)    nRBC 0.4 (*)    Lymphs Abs 0.3 (*)    Abs Immature Granulocytes 0.08 (*)    All other components within normal limits  PROTIME-INR - Abnormal; Notable for the following components:   Prothrombin Time 31.2 (*)    INR 3.1 (*)    All other components within normal limits  INFLUENZA PANEL BY PCR (TYPE A & B) - Abnormal; Notable for the following components:   Influenza A By PCR POSITIVE (*)    All other components within normal limits  BRAIN NATRIURETIC PEPTIDE - Abnormal; Notable for the following components:   B Natriuretic Peptide 1,468.4 (*)    All other components within normal limits  I-STAT TROPONIN, ED - Abnormal; Notable for the following components:   Troponin i, poc 0.34 (*)    All other components within normal limits  CULTURE, BLOOD (ROUTINE X 2)  CULTURE, BLOOD (ROUTINE X 2)  LACTIC ACID, PLASMA  LIPASE, BLOOD  T4, FREE  TSH  LACTIC ACID, PLASMA  URINALYSIS, ROUTINE W REFLEX MICROSCOPIC  EKG EKG Interpretation  Date/Time:  Sunday April 19 2018 17:07:58 EDT Ventricular Rate:  76 PR Interval:    QRS Duration: 154 QT Interval:  448 QTC Calculation: 647 R Axis:   23 Text Interpretation:  Afib/flut and V-paced complexes No further analysis attempted due to paced rhythm No significant change since last tracing Confirmed by Jaksen Fiorella ([id number]) on 04/19/2018 5:15:02 PM   Radiology Dg Chest 2 View  Result Date: 04/19/2018 CLINICAL DATA:  Weakness, cough, and lower extremity edema for 4 days. EXAM: CHEST - 2 VIEW COMPARISON:  04/10/2018 FINDINGS: Cardiac pacemaker.  Postoperative changes in the mediastinum. Cardiac enlargement. Mild pulmonary vascular congestion. Suggestion of mild interstitial changes in the lung bases probably early interstitial edema. Probable small left pleural effusion, new since prior study. No pneumothorax or focal consolidation. Calcification of the aorta. Old resection or resorption of the distal clavicles. IMPRESSION: Cardiac enlargement with mild pulmonary vascular congestion and interstitial edema. Probable small left pleural effusion. Electronically Signed   By: William  Stevens M.D.   On: 04/19/2018 20:07   Ct Head Wo Contrast  Result Date: 04/19/2018 CLINICAL DATA:  Acute loss of consciousness. Patient fell this morning. EXAM: CT HEAD WITHOUT CONTRAST TECHNIQUE: Contiguous axial images were obtained from the base of the skull through the vertex without intravenous contrast. COMPARISON:  None. FINDINGS: Brain: Redemonstration of cerebral atrophy with sulcal and ventricular prominence. Chronic moderate small vessel ischemic disease of periventricular and deep white matter. No large vascular territory infarction. No intra-axial mass nor extra-axial fluid. Vascular: Stable right MCA bifurcation partially calcified aneurysm measuring 12.5 x 10.7 mm, series 3/17. No hyperdense vessel sign. Moderate atherosclerosis of carotid siphons bilaterally. Skull: No acute skull fracture or suspicious osseous lesions. Mild osteopenia at the skull base. Sinuses/Orbits: Intact orbits and globes with bilateral cataract extractions. Mild-to-moderate paranasal sinus mucosal thickening, greatest in the sphenoid sinus with wall thickening of the sphenoid likely representing stigmata of chronic sinusitis. Small mucous retention cysts or polyps are noted within the maxillary sinuses bilaterally. Other: No significant calvarial soft tissue swelling. IMPRESSION: 1. No acute intracranial abnormality. Age related atrophy with moderate small vessel ischemia. 2. Stable right  MCA bifurcation aneurysm estimated at 12.5 x 10.7 mm currently, series 3/17. 3. Paranasal sinus mucosal thickening. Electronically Signed   By: David  Kwon M.D.   On: 04/19/2018 19:38    Procedures Procedures (including critical care time)  Medications Ordered in ED Medications  sodium chloride flush (NS) 0.9 % injection 3 mL (3 mLs Intravenous Not Given 04/19/18 1810)  sodium chloride 0.9 % bolus 1,000 mL (0 mLs Intravenous Stopped 04/19/18 1908)  oseltamivir (TAMIFLU) capsule 30 mg (30 mg Oral Given 04/19/18 2002)     Initial Impression / Assessment and Plan / ED Course  I have reviewed the triage vital signs and the nursing notes.  Pertinent labs & imaging results that were available during my care of the patient were reviewed by me and considered in my medical decision making (see chart for details).        85  yo M with a chief complaint of fall.  Patient has had worsening weakness over the past week or so.  Significant lower extremity edema, I wonder if this is due to him being in a wheelchair for the past week.  Will obtain lab work reassess.  The patient's daughter arrived at bedside to provide further history.  States that he has been having periods of confusion over the past week.  There is that she  visited him and he was newly unable to walk.  Has been having back pain off and on.  No noted fevers or chills no abdominal pain.  Started coughing yesterday.  Significant lower extremity edema that is higher than his baseline.  She does feel that this seems to come and go usually based on his desire to drink lots of fluids.  The patient's troponin is elevated, appears to be chronically elevated though today it is higher than it has been previously.  Patient was reassessed and again concurs that he has had no chest pain or shortness of breath with this illness.  He has influenza A+.  This may be demand ischemia.  Will start on Tamiflu.  Will discuss with hospitalist for  admission.  CRITICAL CARE Performed by: Cecilio Asper   Total critical care time: 35 minutes  Critical care time was exclusive of separately billable procedures and treating other patients.  Critical care was necessary to treat or prevent imminent or life-threatening deterioration.  Critical care was time spent personally by me on the following activities: development of treatment plan with patient and/or surrogate as well as nursing, discussions with consultants, evaluation of patient's response to treatment, examination of patient, obtaining history from patient or surrogate, ordering and performing treatments and interventions, ordering and review of laboratory studies, ordering and review of radiographic studies, pulse oximetry and re-evaluation of patient's condition.  The patients results and plan were reviewed and discussed.   Any x-rays performed were independently reviewed by myself.   Differential diagnosis were considered with the presenting HPI.  Medications  sodium chloride flush (NS) 0.9 % injection 3 mL (3 mLs Intravenous Not Given 04/19/18 1810)  sodium chloride 0.9 % bolus 1,000 mL (0 mLs Intravenous Stopped 04/19/18 1908)  oseltamivir (TAMIFLU) capsule 30 mg (30 mg Oral Given 04/19/18 2002)    Vitals:   04/19/18 1830 04/19/18 1905 04/19/18 1915 04/19/18 2000  BP: (!) 143/93 (!) 148/94 (!) 147/93 (!) 147/94  Pulse:    63  Resp: 20  (!) 22 13  Temp:      TempSrc:      SpO2:    96%    Final diagnoses:  Influenza A  Troponin level elevated    Admission/ observation were discussed with the admitting physician, patient and/or family and they are comfortable with the plan.   Final Clinical Impressions(s) / ED Diagnoses   Final diagnoses:  Influenza A  Troponin level elevated    ED Discharge Orders    None       Deno Etienne, DO 04/19/18 2141

## 2018-04-19 NOTE — ED Notes (Signed)
MD made aware of abnormal lactic

## 2018-04-19 NOTE — ED Triage Notes (Signed)
Pt here from nursing facility for evaluation of weakness, cough, and  lower extremity edema x4 days. Denies shortness of breath/chest pain. Normally able to ambulate. Daughter states he has been wheelchair bound of same amount of time with intermittent episodes of confusion. Per EMS, pt fell this morning and "slid onto the floor." Denies LOC/hitting head. Conscious A/O x4. VSS for EMS.

## 2018-04-19 NOTE — ED Notes (Signed)
Pt. Transported back from xray

## 2018-04-19 NOTE — ED Notes (Signed)
Patient transported to CT 

## 2018-04-20 ENCOUNTER — Inpatient Hospital Stay (HOSPITAL_COMMUNITY): Payer: Medicare Other

## 2018-04-20 ENCOUNTER — Encounter (HOSPITAL_COMMUNITY): Payer: Self-pay | Admitting: Internal Medicine

## 2018-04-20 ENCOUNTER — Telehealth: Payer: Self-pay | Admitting: *Deleted

## 2018-04-20 DIAGNOSIS — J101 Influenza due to other identified influenza virus with other respiratory manifestations: Secondary | ICD-10-CM | POA: Diagnosis present

## 2018-04-20 DIAGNOSIS — I10 Essential (primary) hypertension: Secondary | ICD-10-CM

## 2018-04-20 DIAGNOSIS — I255 Ischemic cardiomyopathy: Secondary | ICD-10-CM

## 2018-04-20 DIAGNOSIS — Z95810 Presence of automatic (implantable) cardiac defibrillator: Secondary | ICD-10-CM

## 2018-04-20 DIAGNOSIS — N183 Chronic kidney disease, stage 3 (moderate): Secondary | ICD-10-CM

## 2018-04-20 DIAGNOSIS — I5021 Acute systolic (congestive) heart failure: Secondary | ICD-10-CM

## 2018-04-20 DIAGNOSIS — J9601 Acute respiratory failure with hypoxia: Secondary | ICD-10-CM

## 2018-04-20 DIAGNOSIS — I5023 Acute on chronic systolic (congestive) heart failure: Secondary | ICD-10-CM

## 2018-04-20 LAB — CBC WITH DIFFERENTIAL/PLATELET
Abs Immature Granulocytes: 0.07 10*3/uL (ref 0.00–0.07)
Basophils Absolute: 0 10*3/uL (ref 0.0–0.1)
Basophils Relative: 0 %
Eosinophils Absolute: 0 10*3/uL (ref 0.0–0.5)
Eosinophils Relative: 0 %
HCT: 40.6 % (ref 39.0–52.0)
Hemoglobin: 12 g/dL — ABNORMAL LOW (ref 13.0–17.0)
Immature Granulocytes: 1 %
Lymphocytes Relative: 15 %
Lymphs Abs: 0.7 10*3/uL (ref 0.7–4.0)
MCH: 29.1 pg (ref 26.0–34.0)
MCHC: 29.6 g/dL — ABNORMAL LOW (ref 30.0–36.0)
MCV: 98.3 fL (ref 80.0–100.0)
MONO ABS: 0.6 10*3/uL (ref 0.1–1.0)
Monocytes Relative: 12 %
Neutro Abs: 3.5 10*3/uL (ref 1.7–7.7)
Neutrophils Relative %: 72 %
Platelets: 188 10*3/uL (ref 150–400)
RBC: 4.13 MIL/uL — AB (ref 4.22–5.81)
RDW: 17.1 % — ABNORMAL HIGH (ref 11.5–15.5)
WBC: 5 10*3/uL (ref 4.0–10.5)
nRBC: 0.4 % — ABNORMAL HIGH (ref 0.0–0.2)

## 2018-04-20 LAB — COMPREHENSIVE METABOLIC PANEL
ALBUMIN: 3.2 g/dL — AB (ref 3.5–5.0)
ALT: 26 U/L (ref 0–44)
AST: 37 U/L (ref 15–41)
Alkaline Phosphatase: 69 U/L (ref 38–126)
Anion gap: 14 (ref 5–15)
BUN: 51 mg/dL — AB (ref 8–23)
CO2: 19 mmol/L — ABNORMAL LOW (ref 22–32)
Calcium: 10.3 mg/dL (ref 8.9–10.3)
Chloride: 105 mmol/L (ref 98–111)
Creatinine, Ser: 1.96 mg/dL — ABNORMAL HIGH (ref 0.61–1.24)
GFR calc Af Amer: 34 mL/min — ABNORMAL LOW (ref >60)
GFR calc non Af Amer: 29 mL/min — ABNORMAL LOW (ref >60)
GLUCOSE: 112 mg/dL — AB (ref 70–99)
Potassium: 4.7 mmol/L (ref 3.5–5.1)
Sodium: 138 mmol/L (ref 135–145)
Total Bilirubin: 1.6 mg/dL — ABNORMAL HIGH (ref 0.3–1.2)
Total Protein: 6.9 g/dL (ref 6.5–8.1)

## 2018-04-20 LAB — PROTIME-INR
INR: >10 (ref 0.8–1.2)
INR: 1.8 — ABNORMAL HIGH (ref 0.8–1.2)
Prothrombin Time: >90 seconds — ABNORMAL HIGH (ref 11.4–15.2)
Prothrombin Time: 20.5 seconds — ABNORMAL HIGH (ref 11.4–15.2)

## 2018-04-20 LAB — TROPONIN I
Troponin I: 0.26 ng/mL (ref <0.03)
Troponin I: 0.24 ng/mL (ref <0.03)
Troponin I: 0.26 ng/mL (ref <0.03)

## 2018-04-20 LAB — LACTIC ACID, PLASMA
Lactic Acid, Venous: 2.8 mmol/L (ref 0.5–1.9)
Lactic Acid, Venous: 2.1 mmol/L (ref 0.5–1.9)

## 2018-04-20 LAB — MRSA PCR SCREENING: MRSA by PCR: NEGATIVE

## 2018-04-20 MED ORDER — ACETAMINOPHEN 650 MG RE SUPP: 650.0000 mg | Freq: Four times a day (QID) | RECTAL | Status: DC | PRN

## 2018-04-20 MED ORDER — FUROSEMIDE 10 MG/ML IJ SOLN
40.0000 mg | Freq: Two times a day (BID) | INTRAMUSCULAR | Status: DC
  Administered 2018-04-20 – 2018-04-22 (×4): 40 mg via INTRAVENOUS
  Filled 2018-04-20 (×4): qty 4

## 2018-04-20 MED ORDER — WARFARIN - PHARMACIST DOSING INPATIENT: Freq: Every day | Status: DC

## 2018-04-20 MED ORDER — ACETAMINOPHEN 325 MG PO TABS
650.0000 mg | ORAL_TABLET | Freq: Four times a day (QID) | ORAL | Status: DC | PRN
  Administered 2018-04-21 – 2018-04-24 (×4): 650 mg via ORAL
  Filled 2018-04-20 (×5): qty 2

## 2018-04-20 MED ORDER — SIMVASTATIN 20 MG PO TABS
20.0000 mg | ORAL_TABLET | Freq: Every day | ORAL | Status: DC
  Administered 2018-04-20 – 2018-04-23 (×5): 20 mg via ORAL
  Filled 2018-04-20 (×5): qty 1

## 2018-04-20 MED ORDER — LEVOTHYROXINE SODIUM 50 MCG PO TABS
50.0000 ug | ORAL_TABLET | Freq: Every day | ORAL | Status: DC
  Administered 2018-04-20 – 2018-04-24 (×5): 50 ug via ORAL
  Filled 2018-04-20 (×5): qty 1

## 2018-04-20 MED ORDER — TIZANIDINE HCL 4 MG PO TABS
4.0000 mg | ORAL_TABLET | Freq: Three times a day (TID) | ORAL | Status: DC | PRN
  Filled 2018-04-20: qty 1

## 2018-04-20 MED ORDER — ONDANSETRON HCL 4 MG PO TABS: 4.0000 mg | ORAL_TABLET | Freq: Four times a day (QID) | ORAL | Status: DC | PRN

## 2018-04-20 MED ORDER — WARFARIN SODIUM 3 MG PO TABS
6.0000 mg | ORAL_TABLET | Freq: Once | ORAL | Status: AC
  Administered 2018-04-20: 6 mg via ORAL
  Filled 2018-04-20: qty 2

## 2018-04-20 MED ORDER — VITAMIN K1 10 MG/ML IJ SOLN
5.0000 mg | INTRAVENOUS | Status: AC
  Administered 2018-04-20: 5 mg via INTRAVENOUS
  Filled 2018-04-20 (×2): qty 0.5

## 2018-04-20 MED ORDER — TIZANIDINE HCL 4 MG PO TABS
4.0000 mg | ORAL_TABLET | Freq: Every day | ORAL | Status: DC
  Administered 2018-04-20 – 2018-04-23 (×5): 4 mg via ORAL
  Filled 2018-04-20 (×5): qty 1

## 2018-04-20 MED ORDER — OSELTAMIVIR PHOSPHATE 30 MG PO CAPS
30.0000 mg | ORAL_CAPSULE | Freq: Every day | ORAL | Status: DC
  Administered 2018-04-20: 30 mg via ORAL
  Filled 2018-04-20: qty 1

## 2018-04-20 MED ORDER — ONDANSETRON HCL 4 MG/2ML IJ SOLN: 4.0000 mg | Freq: Four times a day (QID) | INTRAMUSCULAR | Status: DC | PRN

## 2018-04-20 MED ORDER — METOPROLOL SUCCINATE ER 25 MG PO TB24
25.0000 mg | ORAL_TABLET | Freq: Every day | ORAL | Status: DC
  Administered 2018-04-21 – 2018-04-22 (×2): 25 mg via ORAL
  Filled 2018-04-20 (×3): qty 1

## 2018-04-20 MED ORDER — VITAMIN B-12 1000 MCG PO TABS
1000.0000 ug | ORAL_TABLET | Freq: Every day | ORAL | Status: DC
  Administered 2018-04-20 – 2018-04-24 (×5): 1000 ug via ORAL
  Filled 2018-04-20 (×5): qty 1

## 2018-04-20 MED ORDER — LEVETIRACETAM 500 MG PO TABS
500.0000 mg | ORAL_TABLET | Freq: Two times a day (BID) | ORAL | Status: DC
  Administered 2018-04-20 – 2018-04-24 (×10): 500 mg via ORAL
  Filled 2018-04-20 (×10): qty 1

## 2018-04-20 MED ORDER — POTASSIUM CHLORIDE CRYS ER 10 MEQ PO TBCR
10.0000 meq | EXTENDED_RELEASE_TABLET | Freq: Every day | ORAL | Status: DC
  Administered 2018-04-20 – 2018-04-24 (×5): 10 meq via ORAL
  Filled 2018-04-20 (×5): qty 1

## 2018-04-20 MED ORDER — ASPIRIN 81 MG PO CHEW
81.0000 mg | CHEWABLE_TABLET | Freq: Every day | ORAL | Status: DC
  Administered 2018-04-21 – 2018-04-24 (×4): 81 mg via ORAL
  Filled 2018-04-20 (×4): qty 1

## 2018-04-20 MED ORDER — HYDROCODONE-ACETAMINOPHEN 5-325 MG PO TABS: 1.0000 | ORAL_TABLET | Freq: Four times a day (QID) | ORAL | Status: DC | PRN

## 2018-04-20 MED ORDER — PANTOPRAZOLE SODIUM 40 MG PO TBEC
40.0000 mg | DELAYED_RELEASE_TABLET | Freq: Every day | ORAL | Status: DC
  Administered 2018-04-20 – 2018-04-24 (×5): 40 mg via ORAL
  Filled 2018-04-20 (×5): qty 1

## 2018-04-20 NOTE — ED Notes (Signed)
Breakfast Tray Ordered. 

## 2018-04-20 NOTE — Progress Notes (Signed)
ANTICOAGULATION CONSULT NOTE - Initial Consult  Pharmacy Consult for Warfarin  Indication: atrial fibrillation  Allergies  Allergen Reactions  . Lipitor [Atorvastatin] Other (See Comments)    Unknown reaction  . Nitroglycerin Other (See Comments)    DROPS BLOOD PRESSURE DRASTICALLY    Vital Signs: Temp: 98.6 F (37 C) (03/09 0159) Temp Source: Rectal (03/09 0159) BP: 135/94 (03/09 0200) Pulse Rate: 53 (03/09 0200)  Labs: Recent Labs    04/19/18 1753 04/20/18 0131  HGB 11.2* 12.0*  HCT 37.6* 40.6  PLT 179 188  LABPROT 31.2*  --   INR 3.1*  --   CREATININE 2.06*  --     Estimated Creatinine Clearance: 25.9 mL/min (A) (by C-G formula based on SCr of 2.06 mg/dL (H)).   Medical History: Past Medical History:  Diagnosis Date  . Brain aneurysm   . Chronic combined systolic and diastolic heart failure, NYHA class 3 (HCC)    EF has seemed to vary report-to-report. Myoview 2007 showed EF 30%, echo in 2016 7 EF 60-65% -> Echo in 08/2015 & 08/2016  40-45%  (with basal-mid inferoseptal & apical inferolateral wall), Mod P HTN (PAP ~53 mHg) --most recent in January 2019 says EF 45-50%.  . CKD (chronic kidney disease), stage III ([place]) 08/26/2016  . COPD (chronic obstructive pulmonary disease) ([place])   . Coronary artery disease involving native heart with angina pectoris ([place])    a. MI 2002 - PCI to [place]. b. 2005: PCI to LAD, OM1 & OM2.  c. s/p CABG in 2013: LIMA to LAD, SVG to OM2, SVG to PDA and SVG to RI. ; d. Cath 2017: occluded LAD, OM1 & OM2, SVG-RI with patent LIMA-LAD, SVG-rPDA & SVG-OM2.   . Essential hypertension   . Hyperlipidemia with target LDL less than 70   . Moderate pulmonary arterial systolic hypertension (HCC) 08/28/2016   PA pressures F has been estimated at 53-59 mmHg on echo-   . Moderate to severe aortic stenosis 08/28/2016   Stenosis with mild regurgitation.  . Persistent atrial fibrillation   . Presence of biventricular implantable cardioverter-defibrillator  (ICD) 2008  . Thyroid disease    Assessment: 85 y/o M from nursing facility with weakness, cough, and LE edema. On warfarin PTA for afib. INR just above goal last night at 3.1.  Goal of Therapy:  INR 2-3 Monitor platelets by anticoagulation protocol: Yes   Plan:  INR with AM labs  Narda Bonds 04/20/2018,2:07 AM

## 2018-04-20 NOTE — ED Notes (Signed)
Pt stand pivot with 2 assist to hospital bed.

## 2018-04-20 NOTE — Progress Notes (Signed)
[facility] for Warfarin  Indication: atrial fibrillation  Allergies  Allergen Reactions  . Lipitor [Atorvastatin] Other (See Comments)    Unknown reaction  . Nitroglycerin Other (See Comments)    DROPS BLOOD PRESSURE DRASTICALLY    Vital Signs: Temp: 97.5 F (36.4 C) (03/09 1430) Temp Source: Oral (03/09 1430) BP: 108/70 (03/09 1430) Pulse Rate: 71 (03/09 1430)  Labs: Recent Labs    04/19/18 1753 04/20/18 0131 04/20/18 0757 04/20/18 1443  HGB 11.2* 12.0*  --   --   HCT 37.6* 40.6  --   --   PLT 179 188  --   --   LABPROT 31.2*  --  >90.0* 20.5*  INR 3.1*  --  >10.0* 1.8*  CREATININE 2.06* 1.96*  --   --   TROPONINI  --  0.26* 0.24*  --     Estimated Creatinine Clearance: 28.3 mL/min (A) (by C-G formula based on SCr of 1.96 mg/dL (H)).    Assessment: 85 y/o M from nursing facility with weakness, cough, and LE edema. On warfarin PTA for afib. INR just above goal last night at 3.1.  INR this morning reported as > 10 (up from 3.1 with no warfarin given), not sure if lab was drawn correctly, now INR down to 1.8 s/p 5 mg of Vitamin K  Expect INR to drop lower  Dose prior to admission = 7.5 mg MF, 6 mg other days  Goal of Therapy:  INR 2-3 Monitor platelets by anticoagulation protocol: Yes   Plan:  Warfarin 6 mg po x 1 dose tonight Daily INR  Thank you Anette Guarneri, PharmD   04/20/2018,3:30 PM

## 2018-04-20 NOTE — ED Notes (Addendum)
ED TO INPATIENT HANDOFF REPORT  ED Nurse Name and Phone #:  Jenny Reichmann RN [phone]  S Name/Age/Gender Kenneth Bell 85 y.o. male Room/Bed: 027C/027C  Code Status   Code Status: Full Code  Home/SNF/Other Skilled nursing facility Patient oriented to: self, place, time and situation Is this baseline? Yes   Triage Complete: Triage complete  Chief Complaint Weakness  Triage Note Pt here from nursing facility for evaluation of weakness, cough, and  lower extremity edema x4 days. Denies shortness of breath/chest pain. Normally able to ambulate. Daughter states he has been wheelchair bound of same amount of time with intermittent episodes of confusion. Per EMS, pt fell this morning and "slid onto the floor." Denies LOC/hitting head. Conscious A/O x4. VSS for EMS.   Allergies Allergies  Allergen Reactions  . Lipitor [Atorvastatin] Other (See Comments)    Unknown reaction  . Nitroglycerin Other (See Comments)    DROPS BLOOD PRESSURE DRASTICALLY     Level of Care/Admitting Diagnosis ED Disposition    ED Disposition Condition Comment   Admit  Hospital Area: [facility] [[id number]]  Level of Care: Cardiac Telemetry [103]  Diagnosis: Acute respiratory failure with hypoxia [facility]) [[id number]]  Admitting Physician: Rise Patience [phone]  Attending Physician: Rise Patience [phone]  Estimated length of stay: past midnight tomorrow  Certification:: I certify this patient will need inpatient services for at least 2 midnights  PT Class (Do Not Modify): Inpatient [101]  PT Acc Code (Do Not Modify): Private [1]       B Medical/Surgery History Past Medical History:  Diagnosis Date  . Brain aneurysm   . Chronic combined systolic and diastolic heart failure, NYHA class 3 (HCC)    EF has seemed to vary report-to-report. Myoview 2007 showed EF 30%, echo in 2016 7 EF 60-65% -> Echo in 08/2015 & 08/2016  40-45%  (with basal-mid inferoseptal & apical inferolateral wall), Mod  P HTN (PAP ~53 mHg) --most recent in January 2019 says EF 45-50%.  . CKD (chronic kidney disease), stage III ([place]) 08/26/2016  . COPD (chronic obstructive pulmonary disease) ([place])   . Coronary artery disease involving native heart with angina pectoris ([place])    a. MI 2002 - PCI to [place]. b. 2005: PCI to LAD, OM1 & OM2.  c. s/p CABG in 2013: LIMA to LAD, SVG to OM2, SVG to PDA and SVG to RI. ; d. Cath 2017: occluded LAD, OM1 & OM2, SVG-RI with patent LIMA-LAD, SVG-rPDA & SVG-OM2.   . Essential hypertension   . Hyperlipidemia with target LDL less than 70   . Moderate pulmonary arterial systolic hypertension (HCC) 08/28/2016   PA pressures F has been estimated at 53-59 mmHg on echo-   . Moderate to severe aortic stenosis 08/28/2016   Stenosis with mild regurgitation.  . Persistent atrial fibrillation   . Presence of biventricular implantable cardioverter-defibrillator (ICD) 2008  . Thyroid disease    Past Surgical History:  Procedure Laterality Date  . APPENDECTOMY    . CARDIAC CATHETERIZATION  06/2015   Left main patent. Diffuse LAD disease with competitive flow and distal LAD from LIMA. Mid Cx 35% with occlusion of OM 1 and 90% dCx. OM 2 occluded (perfuse via SVG). Ostial RCA 70%, dRCA 100%.  Patent LIMA-LAD. Widely patent SVG-OM, SVG-PDA: 40% proximal. 100% proximal SVG-RI  . CARDIAC SURGERY    . CARPAL TUNNEL RELEASE Right 2017  . COLONOSCOPY  09/05/2012   Colon Polyps  . CORONARY ANGIOPLASTY WITH STENT PLACEMENT  Prior PCI to OM 2; 2005 PCI to LAD and OM1 with a Taxus DES 2.5 x 16 mm (postdilated to 3.8 mm) and PTCA of OM 2 stent  . CORONARY ARTERY BYPASS GRAFT  2013   LIMA-LAD, SVG-L1-2, SVG-PDA, SVG-RI (known occlusion of SVG RI)  . KNEE SURGERY Bilateral 1999  . NM MYOVIEW LTD  04/2015   Prior lateral infarct with no scanning. EF 31% with moderate global HK  . PROSTATE SURGERY    . SHOULDER SURGERY Bilateral    Twice  . TRANSTHORACIC ECHOCARDIOGRAM  08/2015   Moderate LV  dysfunction with global HK (estimated at 40 and 45%). Worse anterolateral and inferolateral wall.  . TRANSTHORACIC ECHOCARDIOGRAM  08/2016   EF 40-45%. Moderate concentric LVH. Akinesis of basal-mid inferoseptal and apical inferolateral wall.  Aortic sclerosis, no stenosis. Moderate mitral thickening with mild MR. Moderate RV dilation. Moderate TR and PR. PA pressures 53 mmHg  . TRANSTHORACIC ECHOCARDIOGRAM  02/2017    EF 45-50% (similar to last).  Apical-inferolateral akinesis.  Severe focal basal and moderate concentric LVH.  Mild aortic stenosis.  Elevated PA pressures with peak pressures estimated 59 mmHg (moderate PA hypertension)  . VEIN BYPASS SURGERY  2013     A IV Location/Drains/Wounds Patient Lines/Drains/Airways Status   Active Line/Drains/Airways    Name:   Placement date:   Placement time:   Site:   Days:   Peripheral IV 04/19/18 Right Antecubital   04/19/18    1802    Antecubital   1   Peripheral IV 04/19/18 Left Antecubital   04/19/18    1810    Antecubital   1          Intake/Output Last 24 hours  Intake/Output Summary (Last 24 hours) at 04/20/2018 1227 Last data filed at 04/20/2018 1040 Gross per 24 hour  Intake 1000 ml  Output 800 ml  Net 200 ml    Labs/Imaging Results for orders placed or performed during the hospital encounter of 04/19/18 (from the past 48 hour(s))  Culture, blood (Routine x 2)     Status: None (Preliminary result)   Collection Time: 04/19/18  5:18 PM  Result Value Ref Range   Specimen Description BLOOD RIGHT ANTECUBITAL    Special Requests      BOTTLES DRAWN AEROBIC AND ANAEROBIC Blood Culture results may not be optimal due to an inadequate volume of blood received in culture bottles   Culture      NO GROWTH < 12 HOURS Performed at [facility], [address] [place], [place] [address]    Report Status PENDING   Influenza panel by PCR (type A & B)     Status: Abnormal   Collection Time: 04/19/18  5:22 PM  Result Value Ref Range    Influenza A By PCR POSITIVE (A) NEGATIVE   Influenza B By PCR NEGATIVE NEGATIVE    Comment: (NOTE) The Xpert Xpress Flu assay is intended as an aid in the diagnosis of  influenza and should not be used as a sole basis for treatment.  This  assay is FDA approved for nasopharyngeal swab specimens only. Nasal  washings and aspirates are unacceptable for Xpert Xpress Flu testing. Performed at [facility], [place] [address] [place], [place] [address]   Comprehensive metabolic panel     Status: Abnormal   Collection Time: 04/19/18  5:53 PM  Result Value Ref Range   Sodium 135 135 - 145 mmol/L   Potassium 4.3 3.5 - 5.1  mmol/L   Chloride 104 98 - 111 mmol/L   CO2 20 (L) 22 - 32 mmol/L   Glucose, Bld 132 (H) 70 - 99 mg/dL   BUN 52 (H) 8 - 23 mg/dL   Creatinine, Ser 2.06 (H) 0.61 - 1.24 mg/dL   Calcium 9.9 8.9 - 10.3 mg/dL   Total Protein 6.7 6.5 - 8.1 g/dL   Albumin 3.2 (L) 3.5 - 5.0 g/dL   AST 37 15 - 41 U/L   ALT 25 0 - 44 U/L   Alkaline Phosphatase 63 38 - 126 U/L   Total Bilirubin 1.3 (H) 0.3 - 1.2 mg/dL   GFR calc non Af Amer 28 (L) >60 mL/min   GFR calc Af Amer 32 (L) >60 mL/min   Anion gap 11 5 - 15    Comment: Performed at [facility], [address] [place], [place] [address]  CBC with Differential     Status: Abnormal   Collection Time: 04/19/18  5:53 PM  Result Value Ref Range   WBC 5.3 4.0 - 10.5 K/uL   RBC 3.84 (L) 4.22 - 5.81 MIL/uL   Hemoglobin 11.2 (L) 13.0 - 17.0 g/dL   HCT 37.6 (L) 39.0 - 52.0 %   MCV 97.9 80.0 - 100.0 fL   MCH 29.2 26.0 - 34.0 pg   MCHC 29.8 (L) 30.0 - 36.0 g/dL   RDW 17.0 (H) 11.5 - 15.5 %   Platelets 179 150 - 400 K/uL   nRBC 0.4 (H) 0.0 - 0.2 %   Neutrophils Relative % 85 %   Neutro Abs 4.5 1.7 - 7.7 K/uL   Lymphocytes Relative 6 %   Lymphs Abs 0.3 (L) 0.7 - 4.0 K/uL   Monocytes Relative 7 %   Monocytes Absolute 0.4 0.1 - 1.0 K/uL   Eosinophils Relative 0 %   Eosinophils Absolute 0.0 0.0 - 0.5 K/uL   Basophils Relative  0 %   Basophils Absolute 0.0 0.0 - 0.1 K/uL   Immature Granulocytes 2 %   Abs Immature Granulocytes 0.08 (H) 0.00 - 0.07 K/uL    Comment: Performed at [facility] [address] [place], [place] [address]  Protime-INR     Status: Abnormal   Collection Time: 04/19/18  5:53 PM  Result Value Ref Range   Prothrombin Time 31.2 (H) 11.4 - 15.2 seconds   INR 3.1 (H) 0.8 - 1.2    Comment: (NOTE) INR goal varies based on device and disease states. Performed at [facility], [place] [address], [place], [place] [address]   Lipase, blood     Status: None   Collection Time: 04/19/18  5:53 PM  Result Value Ref Range   Lipase 15 11 - 51 U/L    Comment: Performed at [facility] [address] [place], [place] [address]  Brain natriuretic peptide     Status: Abnormal   Collection Time: 04/19/18  5:53 PM  Result Value Ref Range   B Natriuretic Peptide 1,468.4 (H) 0.0 - 100.0 pg/mL    Comment: Performed at [facility] [address] [place], [place] [address]  T4, free     Status: None   Collection Time: 04/19/18  5:53 PM  Result Value Ref Range   Free T4 1.42 0.82 - 1.77 ng/dL    Comment: (NOTE) Biotin ingestion may interfere with free T4 tests. If the results are inconsistent with the TSH level, previous test results, or the clinical presentation,  then consider biotin interference. If needed, order repeat testing after stopping biotin. Performed at [facility], [place] [address], [place], [place] [address]   TSH     Status: None   Collection Time: 04/19/18  5:53 PM  Result Value Ref Range   TSH 1.586 0.350 - 4.500 uIU/mL    Comment: Performed by a 3rd Generation assay with a functional sensitivity of <=0.01 uIU/mL. Performed at [facility], [place] [address], [place], [place] [address]   Lactic acid, plasma     Status: None   Collection Time: 04/19/18  6:13 PM  Result Value Ref Range   Lactic Acid, Venous 1.9 0.5 - 1.9 mmol/L    Comment: Performed  at [facility] [address], [place], [place] [address]  I-stat troponin, ED     Status: Abnormal   Collection Time: 04/19/18  6:17 PM  Result Value Ref Range   Troponin i, poc 0.34 (HH) 0.00 - 0.08 ng/mL   Comment 3            Comment: Due to the release kinetics of cTnI, a negative result within the first hours of the onset of symptoms does not rule out myocardial infarction with certainty. If myocardial infarction is still suspected, repeat the test at appropriate intervals.   Urinalysis, Routine w reflex microscopic     Status: Abnormal   Collection Time: 04/19/18  9:06 PM  Result Value Ref Range   Color, Urine YELLOW YELLOW   APPearance CLEAR CLEAR   Specific Gravity, Urine 1.014 1.005 - 1.030   pH 5.0 5.0 - 8.0   Glucose, UA NEGATIVE NEGATIVE mg/dL   Hgb urine dipstick NEGATIVE NEGATIVE   Bilirubin Urine NEGATIVE NEGATIVE   Ketones, ur NEGATIVE NEGATIVE mg/dL   Protein, ur 30 (A) NEGATIVE mg/dL   Nitrite NEGATIVE NEGATIVE   Leukocytes,Ua NEGATIVE NEGATIVE   RBC / HPF 0-5 0 - 5 RBC/hpf   WBC, UA 0-5 0 - 5 WBC/hpf   Bacteria, UA NONE SEEN NONE SEEN   Squamous Epithelial / LPF 0-5 0 - 5   Mucus PRESENT    Hyaline Casts, UA PRESENT     Comment: Performed at [facility], [address], [place], [place] [address]  Lactic acid, plasma     Status: Abnormal   Collection Time: 04/19/18  9:21 PM  Result Value Ref Range   Lactic Acid, Venous 2.4 (HH) 0.5 - 1.9 mmol/L    Comment: CRITICAL RESULT CALLED TO, READ BACK BY AND VERIFIED WITH: CRUZ,S RN 04/19/2018 2209 JORDANS Performed at [facility], [place] [address], [place], [place] [address]   Comprehensive metabolic panel     Status: Abnormal   Collection Time: 04/20/18  1:31 AM  Result Value Ref Range   Sodium 138 135 - 145 mmol/L   Potassium 4.7 3.5 - 5.1 mmol/L   Chloride 105 98 - 111 mmol/L   CO2 19 (L) 22 - 32 mmol/L   Glucose, Bld 112 (H) 70 - 99 mg/dL   BUN 51 (H) 8 - 23 mg/dL   Creatinine,  Ser 1.96 (H) 0.61 - 1.24 mg/dL   Calcium 10.3 8.9 - 10.3 mg/dL   Total Protein 6.9 6.5 - 8.1 g/dL   Albumin 3.2 (L) 3.5 - 5.0 g/dL   AST 37 15 - 41 U/L   ALT 26 0 - 44 U/L   Alkaline Phosphatase 69 38 - 126 U/L   Total Bilirubin 1.6 (H)  0.3 - 1.2 mg/dL   GFR calc non Af Amer 29 (L) >60 mL/min   GFR calc Af Amer 34 (L) >60 mL/min   Anion gap 14 5 - 15    Comment: Performed at [facility] [address] [place], [place] [address]  CBC WITH DIFFERENTIAL     Status: Abnormal   Collection Time: 04/20/18  1:31 AM  Result Value Ref Range   WBC 5.0 4.0 - 10.5 K/uL   RBC 4.13 (L) 4.22 - 5.81 MIL/uL   Hemoglobin 12.0 (L) 13.0 - 17.0 g/dL   HCT 40.6 39.0 - 52.0 %   MCV 98.3 80.0 - 100.0 fL   MCH 29.1 26.0 - 34.0 pg   MCHC 29.6 (L) 30.0 - 36.0 g/dL   RDW 17.1 (H) 11.5 - 15.5 %   Platelets 188 150 - 400 K/uL   nRBC 0.4 (H) 0.0 - 0.2 %   Neutrophils Relative % 72 %   Neutro Abs 3.5 1.7 - 7.7 K/uL   Lymphocytes Relative 15 %   Lymphs Abs 0.7 0.7 - 4.0 K/uL   Monocytes Relative 12 %   Monocytes Absolute 0.6 0.1 - 1.0 K/uL   Eosinophils Relative 0 %   Eosinophils Absolute 0.0 0.0 - 0.5 K/uL   Basophils Relative 0 %   Basophils Absolute 0.0 0.0 - 0.1 K/uL   Immature Granulocytes 1 %   Abs Immature Granulocytes 0.07 0.00 - 0.07 K/uL    Comment: Performed at [facility], [address] [place], [place] [address]  Troponin I - Now Then Q6H     Status: Abnormal   Collection Time: 04/20/18  1:31 AM  Result Value Ref Range   Troponin I 0.26 (HH) <0.03 ng/mL    Comment: CRITICAL RESULT CALLED TO, READ BACK BY AND VERIFIED WITH: CRUZ,S RN 04/20/2018 0236 JORDANS Performed at [facility], [place] [address] [place], [place] [address]   Lactic acid, plasma     Status: Abnormal   Collection Time: 04/20/18  1:31 AM  Result Value Ref Range   Lactic Acid, Venous 2.8 (HH) 0.5 - 1.9 mmol/L    Comment: CRITICAL RESULT CALLED TO, READ BACK BY AND VERIFIED WITH: CRUZ,S RN  04/20/2018 0220 JORDANS Performed at [facility], [place] [address], [place], [place] [address]   Lactic acid, plasma     Status: Abnormal   Collection Time: 04/20/18  3:09 AM  Result Value Ref Range   Lactic Acid, Venous 2.1 (HH) 0.5 - 1.9 mmol/L    Comment: CRITICAL RESULT CALLED TO, READ BACK BY AND VERIFIED WITH: FLORES,M RN 04/20/2018 0352 JORDANS Performed at [facility], [place] [address] [place], [place] [address]   Troponin I - Now Then Q6H     Status: Abnormal   Collection Time: 04/20/18  7:57 AM  Result Value Ref Range   Troponin I 0.24 (HH) <0.03 ng/mL    Comment: CRITICAL VALUE NOTED.  VALUE IS CONSISTENT WITH PREVIOUSLY REPORTED AND CALLED VALUE. Performed at [facility], [place] [address], [place], [place] [address]   Protime-INR     Status: Abnormal   Collection Time: 04/20/18  7:57 AM  Result Value Ref Range   Prothrombin Time >90.0 (H) 11.4 - 15.2 seconds   INR >10.0 (HH) 0.8 - 1.2    Comment: REPEATED TO VERIFY CRITICAL RESULT CALLED TO, READ BACK BY AND VERIFIED WITH: C.Lyana Asbill,RN 04/20/2018 0902 DAVISB (NOTE) INR goal varies based on  device and disease states. Performed at [facility], [place] [address] [place], [place] [address]    Dg Chest 2 View  Result Date: 04/19/2018 CLINICAL DATA:  Weakness, cough, and lower extremity edema for 4 days. EXAM: CHEST - 2 VIEW COMPARISON:  04/10/2018 FINDINGS: Cardiac pacemaker. Postoperative changes in the mediastinum. Cardiac enlargement. Mild pulmonary vascular congestion. Suggestion of mild interstitial changes in the lung bases probably early interstitial edema. Probable small left pleural effusion, new since prior study. No pneumothorax or focal consolidation. Calcification of the aorta. Old resection or resorption of the distal clavicles. IMPRESSION: Cardiac enlargement with mild pulmonary vascular congestion and interstitial edema. Probable small left pleural effusion. Electronically Signed   By:  Lucienne Capers M.D.   On: 04/19/2018 20:07   Dg Shoulder Right  Result Date: 04/20/2018 CLINICAL DATA:  Chronic pain EXAM: RIGHT SHOULDER - 2+ VIEW COMPARISON:  April 10, 2018 FINDINGS: Oblique and Y scapular images were obtained. There is no acute fracture or dislocation. There is again noted superior migration of the right humeral head. There is evidence of old trauma involving the lateral right clavicle with remodeling. No erosive change. Visualized right lung clear. IMPRESSION: Old trauma involving the lateral right clavicle with remodeling. No acute fracture or dislocation evident. Superior migration of the right humeral head is felt to be indicative of a degree of chronic rotator cuff tear. No erosive change. Appearance similar to recent prior study. Electronically Signed   By: Lowella Grip III M.D.   On: 04/20/2018 07:57   Ct Head Wo Contrast  Result Date: 04/19/2018 CLINICAL DATA:  Acute loss of consciousness. Patient fell this morning. EXAM: CT HEAD WITHOUT CONTRAST TECHNIQUE: Contiguous axial images were obtained from the base of the skull through the vertex without intravenous contrast. COMPARISON:  None. FINDINGS: Brain: Redemonstration of cerebral atrophy with sulcal and ventricular prominence. Chronic moderate small vessel ischemic disease of periventricular and deep white matter. No large vascular territory infarction. No intra-axial mass nor extra-axial fluid. Vascular: Stable right MCA bifurcation partially calcified aneurysm measuring 12.5 x 10.7 mm, series 3/17. No hyperdense vessel sign. Moderate atherosclerosis of carotid siphons bilaterally. Skull: No acute skull fracture or suspicious osseous lesions. Mild osteopenia at the skull base. Sinuses/Orbits: Intact orbits and globes with bilateral cataract extractions. Mild-to-moderate paranasal sinus mucosal thickening, greatest in the sphenoid sinus with wall thickening of the sphenoid likely representing stigmata of chronic  sinusitis. Small mucous retention cysts or polyps are noted within the maxillary sinuses bilaterally. Other: No significant calvarial soft tissue swelling. IMPRESSION: 1. No acute intracranial abnormality. Age related atrophy with moderate small vessel ischemia. 2. Stable right MCA bifurcation aneurysm estimated at 12.5 x 10.7 mm currently, series 3/17. 3. Paranasal sinus mucosal thickening. Electronically Signed   By: Ashley Royalty M.D.   On: 04/19/2018 19:38    Pending Labs Unresulted Labs (From admission, onward)    Start     Ordered   04/20/18 0500  Protime-INR  Daily,   R     04/20/18 0145   04/20/18 0104  Troponin I - Now Then Q6H  Now then every 6 hours,   R     04/20/18 0105   04/19/18 1713  Culture, blood (Routine x 2)  BLOOD CULTURE X 2,   STAT     04/19/18 1712          Vitals/Pain Today's Vitals   04/20/18 0900 04/20/18 1000 04/20/18 1048 04/20/18 1102  BP: 111/71 112/80 107/70   Pulse: Marland Kitchen)  58 (!) 41 (!) 59   Resp: 10 11 15    Temp:      TempSrc:      SpO2: 95% 94% 95%   PainSc:    0-No pain    Isolation Precautions No active isolations  Medications Medications  sodium chloride flush (NS) 0.9 % injection 3 mL (3 mLs Intravenous Not Given 04/19/18 1810)  aspirin chewable tablet 81 mg (81 mg Oral Not Given 04/20/18 1055)  metoprolol succinate (TOPROL-XL) 24 hr tablet 25 mg (0 mg Oral Hold 04/20/18 1057)  simvastatin (ZOCOR) tablet 20 mg (20 mg Oral Given 04/20/18 0306)  levothyroxine (SYNTHROID, LEVOTHROID) tablet 50 mcg (50 mcg Oral Given 04/20/18 0614)  pantoprazole (PROTONIX) EC tablet 40 mg (40 mg Oral Given 04/20/18 1040)  vitamin B-12 (CYANOCOBALAMIN) tablet 1,000 mcg (1,000 mcg Oral Given 04/20/18 1040)  levETIRAcetam (KEPPRA) tablet 500 mg (500 mg Oral Given 04/20/18 1040)  tiZANidine (ZANAFLEX) tablet 4 mg (4 mg Oral Given 04/20/18 0601)  potassium chloride (K-DUR,KLOR-CON) CR tablet 10 mEq (10 mEq Oral Given 04/20/18 1040)  acetaminophen (TYLENOL) tablet 650 mg (has no  administration in time range)    Or  acetaminophen (TYLENOL) suppository 650 mg (has no administration in time range)  ondansetron (ZOFRAN) tablet 4 mg (has no administration in time range)    Or  ondansetron (ZOFRAN) injection 4 mg (has no administration in time range)  oseltamivir (TAMIFLU) capsule 30 mg (has no administration in time range)  tiZANidine (ZANAFLEX) tablet 4 mg (has no administration in time range)  sodium chloride 0.9 % bolus 1,000 mL (0 mLs Intravenous Stopped 04/19/18 1908)  oseltamivir (TAMIFLU) capsule 30 mg (30 mg Oral Given 04/19/18 2002)  phytonadione (VITAMIN K) 5 mg in dextrose 5 % 50 mL IVPB (5 mg Intravenous New Bag/Given 04/20/18 0945)    Mobility walks with device High fall risk   Focused Assessments Neuro Assessment Handoff:  Swallow screen pass? Yes  Cardiac Rhythm: Atrial paced       Neuro Assessment: Within Defined Limits Neuro Checks:      Last Documented NIHSS Modified Score:   Has TPA been given? No If patient is a Neuro Trauma and patient is going to OR before floor call report to [facility] nurse: [phone] or [phone]     R Recommendations: See Admitting Provider Note  Report given to:   Additional Notes:  Pt has INR > 10 and Elevated Trop Daughter at bedside Pt from assisted living - uses walker to ambulate per patient  Positive Flu test

## 2018-04-20 NOTE — Telephone Encounter (Signed)
Received current POC per [facility] regulations from [facility]; forwarded to provider/SLS 03/09

## 2018-04-20 NOTE — ED Notes (Signed)
MD made aware of lactic acid and troponin.

## 2018-04-20 NOTE — ED Notes (Signed)
Patient transported to X-ray 

## 2018-04-20 NOTE — Progress Notes (Signed)
PROGRESS NOTE    Kenneth Bell  BWI:[id number] DOB: 11-25-1926 DOA: 04/19/2018 PCP: [name], DO   Brief Narrative: Kenneth Bell is a 86 y.o. male with history of CAD status post CABG, complete heart block status post pacemaker, A. fib, hypertension, chronic kidney disease, aortic stenosis, hypothyroidism was brought to the ER after patient was getting increasingly short of breath peak and slid onto the floor from his wheelchair at the nursing facility.  Patient also has benign productive cough.  Patient is a poor historian not able to contribute much to the history when I asked.  He states he has been feeling weak over the last few days.  Most of the history was obtained from the ER physician and previous records.  ED Course: In the ER patient was hypoxic appears erythematous.  Chest x-ray shows congestion.  Patient was positive for flu started on Tamiflu.  Initially was given fluid bolus.  Lactate was elevated.  BNP was 1468.  On exam patient has significant lower extremity edema.  Assessment & Plan:   Principal Problem:   Acute respiratory failure with hypoxia (HCC) Active Problems:   Persistent atrial fibrillation (HCC); CHA2DS2Vasc = ~4 (CHF, MI/aortic plaque, Age 31)   CKD (chronic kidney disease), stage III (HCC)   Ischemic cardiomyopathy   Biventricular ICD (implantable cardioverter-defibrillator) in place   Essential hypertension   Acute on chronic systolic heart failure (HCC)   Acute CHF (congestive heart failure) (HCC)   Influenza A   ##Acute respiratory failure with hypoxia -Likely combination of CHF and influenza.  Patient will need IV Lasix but for now I am holding her given the elevated lactate.  Patient usually on Bumex for oxygen.  Closely follow intake output and metabolic panel.  Negative cardiac markers.  Last 2D echo done in December 2019 showed EF of 35 to 40%.  Follow daily weights.  ##Congestive heart failure systolic acute on  chronic -Echocardiogram done from December 2019 showed EF of 30 to 35% -Also secondary to aortic stenosis critical with valve area of 1.1 cm -Continue with IV Lasix  ##Influenza infection - on Tamiflu dosed per pharmacy for flu.   ##Coagulopathy due to Coumadin -INR 10 -Given 5 mg of IV  ##Persistent A. fib with history of complete heart block status post pacemaker    -Rate is controlled with the metoprolol -Holding Coumadin secondary to supratherapeutic INR  ##Right shoulder pain -Old trauma involving the lateral right clavicle with remodeling, superior migration of the right humeral head felt to be indicative of a degree of chronic rotator cuff tear. -Patient was experiencing severe pain -Start the patient on Norco, muscle relaxant at bedtime  ##Severe debility -Involved physical therapy  ##CAD S/P CABG -COntinue metoprolol,  statins.Marland Kitchen  ##Hypothyroidism -Continue on Synthroid.  ##Hyperlipidemia  -on statins.  ##History of aortic stenosis  -felt to be a not a candidate for TAVR,  due to comorbidities.  ##Anemia follow CBC.  ##Chronic kidney disease stage III  -creatinine appears to be at baseline follow metabolic panel.   DVT prophylaxis: Coumadin  code Status: (Full Family Communication: Daughter  disposition Plan: Skilled nursing facility.  Antimicrobials:  Tamiflu 04/19/2018  Subjective: Complaining of pain in the right shoulder  Objective: Vitals:   04/20/18 0300 04/20/18 0345 04/20/18 0445 04/20/18 0600  BP: (!) 140/91 (!) 149/87 (!) 144/76 (!) 132/91  Pulse: (!) 59 61 (!) 41 (!) 58  Resp: (!) 26 11 (!) 9 (!) 23  Temp:      TempSrc:  SpO2: (!) 88% 95% 94% 92%    Intake/Output Summary (Last 24 hours) at 04/20/2018 0920 Last data filed at 04/19/2018 1908 Gross per 24 hour  Intake 1000 ml  Output -  Net 1000 ml   There were no vitals filed for this visit.  Examination:  General exam: Appears calm and comfortable  Respiratory system: Clear  to auscultation. Respiratory effort normal. Cardiovascular system: S1 & S2 heard, RRR. No JVD, murmurs, rubs, gallops or clicks. No pedal edema. Gastrointestinal system: Abdomen is nondistended, soft and nontender. No organomegaly or masses felt. Normal bowel sounds heard. Central nervous system: Alert and oriented. No focal neurological deficits. Extremities: Symmetric 5 x 5 power. Skin: No rashes, lesions or ulcers Psychiatry: Judgement and insight appear normal. Mood & affect appropriate.     Data Reviewed: I have personally reviewed following labs and imaging studies  CBC: Recent Labs  Lab 04/19/18 1753 04/20/18 0131  WBC 5.3 5.0  NEUTROABS 4.5 3.5  HGB 11.2* 12.0*  HCT 37.6* 40.6  MCV 97.9 98.3  PLT 179 704   Basic Metabolic Panel: Recent Labs  Lab 04/19/18 1753 04/20/18 0131  NA 135 138  K 4.3 4.7  CL 104 105  CO2 20* 19*  GLUCOSE 132* 112*  BUN 52* 51*  CREATININE 2.06* 1.96*  CALCIUM 9.9 10.3   GFR: Estimated Creatinine Clearance: 27.2 mL/min (A) (by C-G formula based on SCr of 1.96 mg/dL (H)). Liver Function Tests: Recent Labs  Lab 04/19/18 1753 04/20/18 0131  AST 37 37  ALT 25 26  ALKPHOS 63 69  BILITOT 1.3* 1.6*  PROT 6.7 6.9  ALBUMIN 3.2* 3.2*   Recent Labs  Lab 04/19/18 1753  LIPASE 15   No results for input(s): AMMONIA in the last 168 hours. Coagulation Profile: Recent Labs  Lab 04/19/18 1753 04/20/18 0757  INR 3.1* >10.0*   Cardiac Enzymes: Recent Labs  Lab 04/20/18 0131 04/20/18 0757  TROPONINI 0.26* 0.24*   BNP (last 3 results) No results for input(s): PROBNP in the last 8760 hours. HbA1C: No results for input(s): HGBA1C in the last 72 hours. CBG: No results for input(s): GLUCAP in the last 168 hours. Lipid Profile: No results for input(s): CHOL, HDL, LDLCALC, TRIG, CHOLHDL, LDLDIRECT in the last 72 hours. Thyroid Function Tests: Recent Labs    04/19/18 1753  TSH 1.586  FREET4 1.42   Anemia Panel: No results for  input(s): VITAMINB12, FOLATE, FERRITIN, TIBC, IRON, RETICCTPCT in the last 72 hours. Sepsis Labs: Recent Labs  Lab 04/19/18 1813 04/19/18 2121 04/20/18 0131 04/20/18 0309  LATICACIDVEN 1.9 2.4* 2.8* 2.1*    Recent Results (from the past 240 hour(s))  Culture, blood (Routine x 2)     Status: None (Preliminary result)   Collection Time: 04/19/18  5:18 PM  Result Value Ref Range Status   Specimen Description BLOOD RIGHT ANTECUBITAL  Final   Special Requests   Final    BOTTLES DRAWN AEROBIC AND ANAEROBIC Blood Culture results may not be optimal due to an inadequate volume of blood received in culture bottles   Culture   Final    NO GROWTH < 12 HOURS Performed at [facility] [address] [place], [place] [address]    Report Status PENDING  Incomplete         Radiology Studies: Dg Chest 2 View  Result Date: 04/19/2018 CLINICAL DATA:  Weakness, cough, and lower extremity edema for 4 days. EXAM: CHEST - 2 VIEW COMPARISON:  04/10/2018 FINDINGS: Cardiac  pacemaker. Postoperative changes in the mediastinum. Cardiac enlargement. Mild pulmonary vascular congestion. Suggestion of mild interstitial changes in the lung bases probably early interstitial edema. Probable small left pleural effusion, new since prior study. No pneumothorax or focal consolidation. Calcification of the aorta. Old resection or resorption of the distal clavicles. IMPRESSION: Cardiac enlargement with mild pulmonary vascular congestion and interstitial edema. Probable small left pleural effusion. Electronically Signed   By: Lucienne Capers M.D.   On: 04/19/2018 20:07   Dg Shoulder Right  Result Date: 04/20/2018 CLINICAL DATA:  Chronic pain EXAM: RIGHT SHOULDER - 2+ VIEW COMPARISON:  April 10, 2018 FINDINGS: Oblique and Y scapular images were obtained. There is no acute fracture or dislocation. There is again noted superior migration of the right humeral head. There is evidence of old trauma involving the lateral  right clavicle with remodeling. No erosive change. Visualized right lung clear. IMPRESSION: Old trauma involving the lateral right clavicle with remodeling. No acute fracture or dislocation evident. Superior migration of the right humeral head is felt to be indicative of a degree of chronic rotator cuff tear. No erosive change. Appearance similar to recent prior study. Electronically Signed   By: Lowella Grip III M.D.   On: 04/20/2018 07:57   Ct Head Wo Contrast  Result Date: 04/19/2018 CLINICAL DATA:  Acute loss of consciousness. Patient fell this morning. EXAM: CT HEAD WITHOUT CONTRAST TECHNIQUE: Contiguous axial images were obtained from the base of the skull through the vertex without intravenous contrast. COMPARISON:  None. FINDINGS: Brain: Redemonstration of cerebral atrophy with sulcal and ventricular prominence. Chronic moderate small vessel ischemic disease of periventricular and deep white matter. No large vascular territory infarction. No intra-axial mass nor extra-axial fluid. Vascular: Stable right MCA bifurcation partially calcified aneurysm measuring 12.5 x 10.7 mm, series 3/17. No hyperdense vessel sign. Moderate atherosclerosis of carotid siphons bilaterally. Skull: No acute skull fracture or suspicious osseous lesions. Mild osteopenia at the skull base. Sinuses/Orbits: Intact orbits and globes with bilateral cataract extractions. Mild-to-moderate paranasal sinus mucosal thickening, greatest in the sphenoid sinus with wall thickening of the sphenoid likely representing stigmata of chronic sinusitis. Small mucous retention cysts or polyps are noted within the maxillary sinuses bilaterally. Other: No significant calvarial soft tissue swelling. IMPRESSION: 1. No acute intracranial abnormality. Age related atrophy with moderate small vessel ischemia. 2. Stable right MCA bifurcation aneurysm estimated at 12.5 x 10.7 mm currently, series 3/17. 3. Paranasal sinus mucosal thickening. Electronically  Signed   By: Ashley Royalty M.D.   On: 04/19/2018 19:38        Scheduled Meds: . aspirin  81 mg Oral Daily  . levETIRAcetam  500 mg Oral BID  . levothyroxine  50 mcg Oral Q0600  . metoprolol succinate  25 mg Oral Daily  . oseltamivir  30 mg Oral Q2000  . pantoprazole  40 mg Oral Daily  . potassium chloride  10 mEq Oral Daily  . simvastatin  20 mg Oral QHS  . sodium chloride flush  3 mL Intravenous Once  . tiZANidine  4 mg Oral QHS  . cyanocobalamin  1,000 mcg Oral Daily   Continuous Infusions: . phytonadione (VITAMIN K) IV       LOS: 1 day    Time spent: 35 minutes    Lev Cervone, MD [facility] Pager [phone]  If 7PM-7AM, please contact night-coverage [email or web address] Password [email or web address] 04/20/2018, 9:20 AM

## 2018-04-20 NOTE — Telephone Encounter (Signed)
Received request for Hardcopy Script for Metolazone Tablet, this was completed on 04/16/18 with fax confirmation; also, sent request for orders for Tylenol and Tizanidine. Third request received for copy of current medication list, printed and attached to incoming fax; all forwarded to provider/SLS 03/09

## 2018-04-20 NOTE — H&P (Signed)
History and Physical    Kenneth Bell BWG:[id number] DOB: 08/31/1926 DOA: 04/19/2018  PCP: [name], DO  Patient coming from: [facility].  Chief Complaint: Shortness of breath.  HPI: Kenneth Bell is a 85 y.o. male with history of CAD status post CABG, complete heart block status post pacemaker, A. fib, hypertension, chronic kidney disease, aortic stenosis, hypothyroidism was brought to the ER after patient was getting increasingly short of breath peak and slid onto the floor from his wheelchair at the nursing facility.  Patient also has benign productive cough.  Patient is a poor historian not able to contribute much to the history when I asked.  He states he has been feeling weak over the last few days.  Most of the history was obtained from the ER physician and previous records.  ED Course: In the ER patient was hypoxic appears erythematous.  Chest x-ray shows congestion.  Patient was positive for flu started on Tamiflu.  Initially was given fluid bolus.  Lactate was elevated.  BNP was 1468.  On exam patient has significant lower extremity edema.  Review of Systems: As per HPI, rest all negative.   Past Medical History:  Diagnosis Date  . Brain aneurysm   . Chronic combined systolic and diastolic heart failure, NYHA class 3 (HCC)    EF has seemed to vary report-to-report. Myoview 2007 showed EF 30%, echo in 2016 7 EF 60-65% -> Echo in 08/2015 & 08/2016  40-45%  (with basal-mid inferoseptal & apical inferolateral wall), Mod P HTN (PAP ~53 mHg) --most recent in January 2019 says EF 45-50%.  . CKD (chronic kidney disease), stage III ([place]) 08/26/2016  . COPD (chronic obstructive pulmonary disease) ([place])   . Coronary artery disease involving native heart with angina pectoris ([place])    a. MI 2002 - PCI to [place]. b. 2005: PCI to LAD, OM1 & OM2.  c. s/p CABG in 2013: LIMA to LAD, SVG to OM2, SVG to PDA and SVG to RI. ; d. Cath 2017: occluded LAD, OM1 & OM2, SVG-RI with  patent LIMA-LAD, SVG-rPDA & SVG-OM2.   . Essential hypertension   . Hyperlipidemia with target LDL less than 70   . Moderate pulmonary arterial systolic hypertension (HCC) 08/28/2016   PA pressures F has been estimated at 53-59 mmHg on echo-   . Moderate to severe aortic stenosis 08/28/2016   Stenosis with mild regurgitation.  . Persistent atrial fibrillation   . Presence of biventricular implantable cardioverter-defibrillator (ICD) 2008  . Thyroid disease     Past Surgical History:  Procedure Laterality Date  . APPENDECTOMY    . CARDIAC CATHETERIZATION  06/2015   Left main patent. Diffuse LAD disease with competitive flow and distal LAD from LIMA. Mid Cx 35% with occlusion of OM 1 and 90% dCx. OM 2 occluded (perfuse via SVG). Ostial RCA 70%, dRCA 100%.  Patent LIMA-LAD. Widely patent SVG-OM, SVG-PDA: 40% proximal. 100% proximal SVG-RI  . CARDIAC SURGERY    . CARPAL TUNNEL RELEASE Right 2017  . COLONOSCOPY  09/05/2012   Colon Polyps  . CORONARY ANGIOPLASTY WITH STENT PLACEMENT     Prior PCI to OM 2; 2005 PCI to LAD and OM1 with a Taxus DES 2.5 x 16 mm (postdilated to 3.8 mm) and PTCA of OM 2 stent  . CORONARY ARTERY BYPASS GRAFT  2013   LIMA-LAD, SVG-L1-2, SVG-PDA, SVG-RI (known occlusion of SVG RI)  . KNEE SURGERY Bilateral 1999  . NM MYOVIEW LTD  04/2015  Prior lateral infarct with no scanning. EF 31% with moderate global HK  . PROSTATE SURGERY    . SHOULDER SURGERY Bilateral    Twice  . TRANSTHORACIC ECHOCARDIOGRAM  08/2015   Moderate LV dysfunction with global HK (estimated at 40 and 45%). Worse anterolateral and inferolateral wall.  . TRANSTHORACIC ECHOCARDIOGRAM  08/2016   EF 40-45%. Moderate concentric LVH. Akinesis of basal-mid inferoseptal and apical inferolateral wall.  Aortic sclerosis, no stenosis. Moderate mitral thickening with mild MR. Moderate RV dilation. Moderate TR and PR. PA pressures 53 mmHg  . TRANSTHORACIC ECHOCARDIOGRAM  02/2017    EF 45-50% (similar to  last).  Apical-inferolateral akinesis.  Severe focal basal and moderate concentric LVH.  Mild aortic stenosis.  Elevated PA pressures with peak pressures estimated 59 mmHg (moderate PA hypertension)  . VEIN BYPASS SURGERY  2013     reports that he quit smoking about 50 years ago. His smoking use included cigarettes. He has never used smokeless tobacco. He reports current alcohol use of about 6.0 standard drinks of alcohol per week. He reports that he does not use drugs.  Allergies  Allergen Reactions  . Lipitor [Atorvastatin] Other (See Comments)    Unknown reaction  . Nitroglycerin Other (See Comments)    DROPS BLOOD PRESSURE DRASTICALLY     Family History  Problem Relation Age of Onset  . Arthritis Mother   . Heart disease Father     Prior to Admission medications   Medication Sig Start Date End Date Taking? Authorizing Provider  aspirin 81 MG chewable tablet Chew 81 mg by mouth daily.   Yes [provider]  bumetanide (BUMEX) 2 MG tablet Take 2 mg by mouth See admin instructions. Take one tablet (2 mg) by mouth daily for fluid retention, may have extra dose (2 mg) if weight gain is greater than 8 lbs. 02/15/18  Yes [provider]  cyanocobalamin 1000 MCG tablet Take 1 tablet (1,000 mcg total) by mouth daily. 03/12/18  Yes [name], DO  levETIRAcetam (KEPPRA) 500 MG tablet TAKE 1 TABLET BY MOUTH TWICE DAILY Patient taking differently: Take 500 mg by mouth 2 (two) times daily.  03/05/18  Yes Wendling, Crosby Oyster, DO  levothyroxine (SYNTHROID, LEVOTHROID) 50 MCG tablet TAKE 1 TABLET BY MOUTH ONCE DAILY Patient taking differently: Take 50 mcg by mouth daily before breakfast.  02/19/18  Yes Wendling, Crosby Oyster, DO  metolazone (ZAROXOLYN) 2.5 MG tablet Take 1 tablet (2.5 mg total) by mouth every 7 (seven) days. Patient taking differently: Take 2.5 mg by mouth every Monday.  04/16/18  Yes [name], DO  metoprolol succinate (TOPROL-XL) 25 MG  24 hr tablet Take 1.5 tablets (37.5 mg total) by mouth daily. Patient taking differently: Take 25 mg by mouth daily.  03/17/18  Yes Croitoru, Mihai, MD  omeprazole (PRILOSEC) 20 MG capsule TAKE 1 CAPSULE BY MOUTH ONCE DAILY Patient taking differently: Take 20 mg by mouth daily before breakfast.  08/04/17  Yes Wendling, Crosby Oyster, DO  potassium chloride (K-DUR) 10 MEQ tablet Take 1 tablet (10 mEq total) by mouth daily. 03/12/18  Yes Wendling, Crosby Oyster, DO  predniSONE (DELTASONE) 20 MG tablet TAKE 1 TABLET BY MOUTH TWICE DAILY WITH MEALS FOR 5 DAYS Patient taking differently: Take 20 mg by mouth daily. 5 day course stared 04/18/2018 04/13/18  Yes Wendling, Crosby Oyster, DO  simvastatin (ZOCOR) 20 MG tablet TAKE 1 TABLET BY MOUTH ONCE DAILY IN THE EVENING Patient taking differently: Take 20 mg by mouth  at bedtime.  02/12/18  Yes Wendling, Crosby Oyster, DO  tiZANidine (ZANAFLEX) 4 MG capsule Take 1 capsule (4 mg total) by mouth 3 (three) times daily as needed for muscle spasms. 1 tab po q hs Patient taking differently: Take 4 mg by mouth See admin instructions. Take one capsule (4 mg) by mouth daily at bedtime, may also take one capsule (4 mg) every 8 hours as needed for muscle spasms. 04/06/18  Yes [name], DO  warfarin (COUMADIN) 6 MG tablet Take 6 mg by mouth See admin instructions. Take one tablet (6 mg) by mouth on Sunday, Tuesday, Wednesday, Thursday, Saturday evenings 02/02/18  Yes [name], DO  warfarin (COUMADIN) 7.5 MG tablet Take 1 tab on Mondays and Fridays. - hold Today's dose as of 03/02/2018 Patient taking differently: Take 7.5 mg by mouth See admin instructions. Take one tablet (7.5 mg) by mouth on Monday and Friday evenings 03/02/18  Yes Elgergawy, Silver Huguenin, MD    Physical Exam: Vitals:   04/19/18 2245 04/19/18 2300 04/19/18 2315 04/20/18 0015  BP: (!) 151/102 (!) 133/107 (!) 146/93 135/83  Pulse: (!) 55 (!) 46 (!) 55 (!) 59  Resp: (!) 0 18 17 15     Temp:      TempSrc:      SpO2: 91% 97% 92% 93%      Constitutional: Moderately built and nourished. Vitals:   04/19/18 2245 04/19/18 2300 04/19/18 2315 04/20/18 0015  BP: (!) 151/102 (!) 133/107 (!) 146/93 135/83  Pulse: (!) 55 (!) 46 (!) 55 (!) 59  Resp: (!) 0 18 17 15   Temp:      TempSrc:      SpO2: 91% 97% 92% 93%   Eyes: Anicteric no pallor. ENMT: No discharge from the ears eyes nose or mouth. Neck: No mass felt.  No neck rigidity. Respiratory: No rhonchi or crepitations. Cardiovascular: S1-S2 heard. Abdomen: Soft nontender bowel sounds present. Musculoskeletal: Bilateral lower extremity edema present. Skin: No rash. Neurologic: Alert awake oriented to time place and person.  Moves all extremities. Psychiatric: Appears normal.   Labs on Admission: I have personally reviewed following labs and imaging studies  CBC: Recent Labs  Lab 04/19/18 1753  WBC 5.3  NEUTROABS 4.5  HGB 11.2*  HCT 37.6*  MCV 97.9  PLT 258   Basic Metabolic Panel: Recent Labs  Lab 04/19/18 1753  NA 135  K 4.3  CL 104  CO2 20*  GLUCOSE 132*  BUN 52*  CREATININE 2.06*  CALCIUM 9.9   GFR: Estimated Creatinine Clearance: 25.9 mL/min (A) (by C-G formula based on SCr of 2.06 mg/dL (H)). Liver Function Tests: Recent Labs  Lab 04/19/18 1753  AST 37  ALT 25  ALKPHOS 63  BILITOT 1.3*  PROT 6.7  ALBUMIN 3.2*   Recent Labs  Lab 04/19/18 1753  LIPASE 15   No results for input(s): AMMONIA in the last 168 hours. Coagulation Profile: Recent Labs  Lab 04/19/18 1753  INR 3.1*   Cardiac Enzymes: No results for input(s): CKTOTAL, CKMB, CKMBINDEX, TROPONINI in the last 168 hours. BNP (last 3 results) No results for input(s): PROBNP in the last 8760 hours. HbA1C: No results for input(s): HGBA1C in the last 72 hours. CBG: No results for input(s): GLUCAP in the last 168 hours. Lipid Profile: No results for input(s): CHOL, HDL, LDLCALC, TRIG, CHOLHDL, LDLDIRECT in the last 72  hours. Thyroid Function Tests: Recent Labs    04/19/18 1753  TSH 1.586  FREET4 1.42  Anemia Panel: No results for input(s): VITAMINB12, FOLATE, FERRITIN, TIBC, IRON, RETICCTPCT in the last 72 hours. Urine analysis:    Component Value Date/Time   COLORURINE YELLOW 04/19/2018 2106   APPEARANCEUR CLEAR 04/19/2018 2106   LABSPEC 1.014 04/19/2018 2106   PHURINE 5.0 04/19/2018 2106   GLUCOSEU NEGATIVE 04/19/2018 2106   HGBUR NEGATIVE 04/19/2018 2106   BILIRUBINUR NEGATIVE 04/19/2018 2106   [place] NEGATIVE 04/19/2018 2106   PROTEINUR 30 (A) 04/19/2018 2106   NITRITE NEGATIVE 04/19/2018 2106   LEUKOCYTESUR NEGATIVE 04/19/2018 2106   Sepsis Labs: @LABRCNTIP (procalcitonin:4,lacticidven:4) )No results found for this or any previous visit (from the past 240 hour(s)).   Radiological Exams on Admission: Dg Chest 2 View  Result Date: 04/19/2018 CLINICAL DATA:  Weakness, cough, and lower extremity edema for 4 days. EXAM: CHEST - 2 VIEW COMPARISON:  04/10/2018 FINDINGS: Cardiac pacemaker. Postoperative changes in the mediastinum. Cardiac enlargement. Mild pulmonary vascular congestion. Suggestion of mild interstitial changes in the lung bases probably early interstitial edema. Probable small left pleural effusion, new since prior study. No pneumothorax or focal consolidation. Calcification of the aorta. Old resection or resorption of the distal clavicles. IMPRESSION: Cardiac enlargement with mild pulmonary vascular congestion and interstitial edema. Probable small left pleural effusion. Electronically Signed   By: Lucienne Capers M.D.   On: 04/19/2018 20:07   Ct Head Wo Contrast  Result Date: 04/19/2018 CLINICAL DATA:  Acute loss of consciousness. Patient fell this morning. EXAM: CT HEAD WITHOUT CONTRAST TECHNIQUE: Contiguous axial images were obtained from the base of the skull through the vertex without intravenous contrast. COMPARISON:  None. FINDINGS: Brain: Redemonstration of cerebral  atrophy with sulcal and ventricular prominence. Chronic moderate small vessel ischemic disease of periventricular and deep white matter. No large vascular territory infarction. No intra-axial mass nor extra-axial fluid. Vascular: Stable right MCA bifurcation partially calcified aneurysm measuring 12.5 x 10.7 mm, series 3/17. No hyperdense vessel sign. Moderate atherosclerosis of carotid siphons bilaterally. Skull: No acute skull fracture or suspicious osseous lesions. Mild osteopenia at the skull base. Sinuses/Orbits: Intact orbits and globes with bilateral cataract extractions. Mild-to-moderate paranasal sinus mucosal thickening, greatest in the sphenoid sinus with wall thickening of the sphenoid likely representing stigmata of chronic sinusitis. Small mucous retention cysts or polyps are noted within the maxillary sinuses bilaterally. Other: No significant calvarial soft tissue swelling. IMPRESSION: 1. No acute intracranial abnormality. Age related atrophy with moderate small vessel ischemia. 2. Stable right MCA bifurcation aneurysm estimated at 12.5 x 10.7 mm currently, series 3/17. 3. Paranasal sinus mucosal thickening. Electronically Signed   By: Ashley Royalty M.D.   On: 04/19/2018 19:38    EKG: Independently reviewed.  Paced rhythm.  Assessment/Plan Principal Problem:   Acute respiratory failure with hypoxia (HCC) Active Problems:   Persistent atrial fibrillation ([place]); CHA2DS2Vasc = ~4 (CHF, MI/aortic plaque, Age 65)   CKD (chronic kidney disease), stage III (HCC)   Ischemic cardiomyopathy   Biventricular ICD (implantable cardioverter-defibrillator) in place   Essential hypertension   Acute on chronic systolic heart failure (HCC)   Acute CHF (congestive heart failure) (HCC)   Influenza A    1. Acute respiratory failure with hypoxia likely combination of CHF and influenza.  Patient is on Tamiflu dosed per pharmacy for flu.  Patient will need IV Lasix but for now I am holding her given the  elevated lactate.  Patient usually on Bumex for oxygen.  Closely follow intake output and metabolic panel.  Negative cardiac markers.  Last 2D echo done in  December 2019 showed EF of 35 to 40%.  Follow daily weights. 2. Persistent A. fib with history of complete heart block status post pacemaker on Coumadin.  INR therapeutic.  On metoprolol. 3. ED status post CABG we will cycle cardiac markers.  Continue metoprolol patient is on Coumadin statins.. 4. Hypothyroidism on Synthroid. 5. Hyperlipidemia on statins. 6. History of aortic stenosis was felt to be a not a candidate for TAVR.  Due to comorbidities. 7. Anemia follow CBC. 8. Chronic kidney disease stage III creatinine appears to be at baseline follow metabolic panel.   Since patient was complaining of right shoulder pain I have ordered x-ray of the right shoulder.  DVT prophylaxis: Coumadin. Code Status: Full code. Family Communication: No family at the bedside. Disposition Plan: Back to facility. Consults called: None. Admission status: Inpatient.   Rise Patience MD [facility] Pager [phone].  If 7PM-7AM, please contact night-coverage [email or web address] Password [email or web address]  04/20/2018, 1:06 AM

## 2018-04-20 NOTE — ED Notes (Signed)
MD notified of lactic (2.2)

## 2018-04-20 NOTE — Progress Notes (Signed)
PT Cancellation Note  Patient Details Name: Kenneth Bell MRN: [id number] DOB: Feb 03, 1927   Cancelled Treatment:    Reason Eval/Treat Not Completed: Medical issues which prohibited therapy labs demonstrating INR >10. Will follow-up when medically appropriate.   Lanney Gins, PT, DPT Supplemental Physical Therapist 04/20/18 2:57 PM Pager: [phone] Office: [phone]

## 2018-04-21 ENCOUNTER — Other Ambulatory Visit: Payer: Self-pay

## 2018-04-21 DIAGNOSIS — I4819 Other persistent atrial fibrillation: Secondary | ICD-10-CM

## 2018-04-21 LAB — CBC
HEMATOCRIT: 33.9 % — AB (ref 39.0–52.0)
Hemoglobin: 10.6 g/dL — ABNORMAL LOW (ref 13.0–17.0)
MCH: 29.7 pg (ref 26.0–34.0)
MCHC: 31.3 g/dL (ref 30.0–36.0)
MCV: 95 fL (ref 80.0–100.0)
PLATELETS: 177 10*3/uL (ref 150–400)
RBC: 3.57 MIL/uL — ABNORMAL LOW (ref 4.22–5.81)
RDW: 17 % — AB (ref 11.5–15.5)
WBC: 4.5 10*3/uL (ref 4.0–10.5)
nRBC: 0 % (ref 0.0–0.2)

## 2018-04-21 LAB — BASIC METABOLIC PANEL
Anion gap: 9 (ref 5–15)
BUN: 50 mg/dL — AB (ref 8–23)
CO2: 22 mmol/L (ref 22–32)
CREATININE: 1.75 mg/dL — AB (ref 0.61–1.24)
Calcium: 9.4 mg/dL (ref 8.9–10.3)
Chloride: 103 mmol/L (ref 98–111)
GFR calc Af Amer: 39 mL/min — ABNORMAL LOW (ref >60)
GFR calc non Af Amer: 34 mL/min — ABNORMAL LOW (ref >60)
GLUCOSE: 115 mg/dL — AB (ref 70–99)
Potassium: 3.8 mmol/L (ref 3.5–5.1)
Sodium: 134 mmol/L — ABNORMAL LOW (ref 135–145)

## 2018-04-21 LAB — PROTIME-INR
INR: 1.4 — ABNORMAL HIGH (ref 0.8–1.2)
Prothrombin Time: 16.5 seconds — ABNORMAL HIGH (ref 11.4–15.2)

## 2018-04-21 MED ORDER — OSELTAMIVIR PHOSPHATE 30 MG PO CAPS
30.0000 mg | ORAL_CAPSULE | Freq: Two times a day (BID) | ORAL | Status: AC
  Administered 2018-04-21 – 2018-04-23 (×5): 30 mg via ORAL
  Filled 2018-04-21 (×5): qty 1

## 2018-04-21 MED ORDER — WARFARIN SODIUM 2 MG PO TABS
4.0000 mg | ORAL_TABLET | Freq: Every day | ORAL | Status: DC
  Administered 2018-04-21: 4 mg via ORAL
  Filled 2018-04-21: qty 2

## 2018-04-21 MED ORDER — WARFARIN SODIUM 7.5 MG PO TABS: 7.5000 mg | ORAL_TABLET | Freq: Once | ORAL | Status: DC

## 2018-04-21 NOTE — Progress Notes (Signed)
[place] for Warfarin  Indication: atrial fibrillation  Allergies  Allergen Reactions  . Lipitor [Atorvastatin] Other (See Comments)    Unknown reaction  . Nitroglycerin Other (See Comments)    DROPS BLOOD PRESSURE DRASTICALLY    Vital Signs: Temp: 97.5 F (36.4 C) (03/10 0800) Temp Source: Oral (03/10 0800) BP: 108/66 (03/10 0849) Pulse Rate: 60 (03/10 0849)  Labs: Recent Labs    04/19/18 1753 04/20/18 0131 04/20/18 0757 04/20/18 1443 04/21/18 0448  HGB 11.2* 12.0*  --   --  10.6*  HCT 37.6* 40.6  --   --  33.9*  PLT 179 188  --   --  177  LABPROT 31.2*  --  >90.0* 20.5* 16.5*  INR 3.1*  --  >10.0* 1.8* 1.4*  CREATININE 2.06* 1.96*  --   --  1.75*  TROPONINI  --  0.26* 0.24* 0.26*  --     Estimated Creatinine Clearance: 31.7 mL/min (A) (by C-G formula based on SCr of 1.75 mg/dL (H)).    Assessment: 85 y/o M from nursing facility with weakness, cough, and LE edema. On warfarin PTA for afib. INR just above goal last night at 3.1.  INR this morning reported as > 10 (up from 3.1 with no warfarin given), not sure if lab was drawn correctly, now INR down to 1.8 s/p 5 mg of Vitamin K  Expect INR to drop lower - > INR = 1.4  Dose prior to admission = 7.5 mg MF, 6 mg other days  Goal of Therapy:  INR 2-3 Monitor platelets by anticoagulation protocol: Yes   Plan:  Warfarin 7.5 mg po x 1 dose tonight Daily INR  Thank you Anette Guarneri, PharmD [phone]   04/21/2018,10:20 AM

## 2018-04-21 NOTE — Evaluation (Signed)
Physical Therapy Evaluation Patient Details Name: Kenneth Bell MRN: [id number] DOB: Nov 07, 1926 Today's Date: 04/21/2018   History of Present Illness  Mylan Schwarz is a 85 y.o. male with history of CAD status post CABG, complete heart block status post pacemaker, A. fib, hypertension, chronic kidney disease, aortic stenosis, hypothyroidism was brought to the ER after patient was getting increasingly short of breath peak and slid onto the floor from his wheelchair at the nursing facility.  Patient also has benign productive cough.    Clinical Impression  Pt admitted with above diagnosis. Pt currently with functional limitations due to the deficits listed below (see PT Problem List). Pt was able to ambulate with min guard to min assist to bathroom with RW and cues.  Needs cues for safety.  Also needed mod assist for standing from recliner as UEs are weak and he has difficulty with power up.  If A living cannot assist at min assist level, will need SNF for therapy prior to d/c home.  Will follow acutely.   Pt will benefit from skilled PT to increase their independence and safety with mobility to allow discharge to the venue listed below.      Follow Up Recommendations SNF;Supervision/Assistance - 24 hour    Equipment Recommendations  None recommended by PT    Recommendations for Other Services       Precautions / Restrictions Precautions Precautions: Fall Precaution Comments: DROPLET Restrictions Weight Bearing Restrictions: No      Mobility  Bed Mobility               General bed mobility comments: NT pt in chair on arrival  Transfers Overall transfer level: Needs assistance Equipment used: Rolling walker (2 wheeled) Transfers: Sit to/from Stand Sit to Stand: Mod assist;Min assist         General transfer comment: Pt needed incr time and several attempts using momentum and asssist for power up to stand from low recliner.  Put pillows in recliner to help with this next  time pt getst up.  Pt with difficulty using UEs to push up due to weakness bil UEs.   Ambulation/Gait Ambulation/Gait assistance: Min assist;Min guard Gait Distance (Feet): 30 Feet(15 feet x 2) Assistive device: Rolling walker (2 wheeled) Gait Pattern/deviations: Decreased stride length;Step-through pattern;Drifts right/left;Wide base of support;Antalgic;Leaning posteriorly   Gait velocity interpretation: <1.31 ft/sec, indicative of household ambulator General Gait Details: Pt was able to ambulate to bathroom and back to chair.  Needed steadying assist at times due to posterior lean.  Pt relies on UES but at times has trouble gripping RW as well.  Needed cues for hand placement.   Stairs            Wheelchair Mobility    Modified Rankin (Stroke Patients Only)       Balance Overall balance assessment: Needs assistance Sitting-balance support: No upper extremity supported;Feet supported Sitting balance-Leahy Scale: Fair     Standing balance support: Bilateral upper extremity supported;During functional activity Standing balance-Leahy Scale: Poor Standing balance comment: relies on UE support for balance.                              Pertinent Vitals/Pain Pain Assessment: No/denies pain    Home Living Family/patient expects to be discharged to:: Skilled nursing facility Living Arrangements: Children(son and daughter in law) Available Help at Discharge: Family;Available PRN/intermittently Type of Home: Apartment([facility]) Home Access: Level entry  Home Layout: One level Home Equipment: [place] - 4 wheels;Walker - 2 wheels;Tub bench;Grab bars - toilet;Grab bars - tub/shower      Prior Function Level of Independence: Independent with assistive device(s);Needs assistance   Gait / Transfers Assistance Needed: Pt states he used rollator the most.  Staff usually walk with him per pt.    ADL's / Homemaking Assistance Needed: A by staff with B/D  per pt        Hand Dominance   Dominant Hand: Right    Extremity/Trunk Assessment   Upper Extremity Assessment Upper Extremity Assessment: Defer to OT evaluation    Lower Extremity Assessment Lower Extremity Assessment: Generalized weakness    Cervical / Trunk Assessment Cervical / Trunk Assessment: Kyphotic  Communication   Communication: No difficulties  Cognition Arousal/Alertness: Awake/alert Behavior During Therapy: WFL for tasks assessed/performed Overall Cognitive Status: Within Functional Limits for tasks assessed                                        General Comments      Exercises General Exercises - Lower Extremity Ankle Circles/Pumps: AROM;Both;10 reps;Seated Long Arc Quad: AROM;Both;10 reps;Seated   Assessment/Plan    PT Assessment Patient needs continued PT services  PT Problem List Decreased activity tolerance;Decreased balance;Decreased mobility;Decreased knowledge of use of DME;Decreased safety awareness;Decreased knowledge of precautions;Cardiopulmonary status limiting activity       PT Treatment Interventions DME instruction;Gait training;Functional mobility training;Therapeutic activities;Therapeutic exercise;Balance training;Patient/family education    PT Goals (Current goals can be found in the Care Plan section)  Acute Rehab PT Goals Patient Stated Goal: to get my arms stronger PT Goal Formulation: With patient Time For Goal Achievement: 05/05/18 Potential to Achieve Goals: Good    Frequency Min 3X/week   Barriers to discharge Decreased caregiver support      Co-evaluation               AM-PAC PT "6 Clicks" Mobility  Outcome Measure Help needed turning from your back to your side while in a flat bed without using bedrails?: A Little Help needed moving from lying on your back to sitting on the side of a flat bed without using bedrails?: A Little Help needed moving to and from a bed to a chair (including a  wheelchair)?: A Lot Help needed standing up from a chair using your arms (e.g., wheelchair or bedside chair)?: A Lot Help needed to walk in hospital room?: A Little Help needed climbing 3-5 steps with a railing? : A Lot 6 Click Score: 15    End of Session Equipment Utilized During Treatment: Gait belt Activity Tolerance: Patient limited by fatigue Patient left: in chair;with call bell/phone within reach;with chair alarm set Nurse Communication: Mobility status PT Visit Diagnosis: Unsteadiness on feet (R26.81);Muscle weakness (generalized) (M62.81)    Time: 6606-3016 PT Time Calculation (min) (ACUTE ONLY): 23 min   Charges:   PT Evaluation $PT Eval Moderate Complexity: 1 Mod PT Treatments $Gait Training: 8-22 mins        [place] Pager:  [phone]  Office:  [place] 04/21/2018, 11:46 AM

## 2018-04-21 NOTE — Progress Notes (Signed)
PROGRESS NOTE    Kenneth Bell  VEL:[id number] DOB: 03/28/1926 DOA: 04/19/2018 PCP: [name], DO   Brief Narrative: Kenneth Bell is a 85 y.o. male with history of CAD status post CABG, complete heart block status post pacemaker, A. fib, hypertension, chronic kidney disease, aortic stenosis, hypothyroidism was brought to the ER after patient was getting increasingly short of breath peak and slid onto the floor from his wheelchair at the nursing facility.  Patient also has benign productive cough.  Patient is a poor historian not able to contribute much to the history when I asked.  He states he has been feeling weak over the last few days.  Most of the history was obtained from the ER physician and previous records.  ED Course: In the ER patient was hypoxic appears erythematous.  Chest x-ray shows congestion.  Patient was positive for flu started on Tamiflu.  Initially was given fluid bolus.  Lactate was elevated.  BNP was 1468.  On exam patient has significant lower extremity edema.  Assessment & Plan:   Principal Problem:   Acute respiratory failure with hypoxia (HCC) Active Problems:   Persistent atrial fibrillation (HCC); CHA2DS2Vasc = ~4 (CHF, MI/aortic plaque, Age 43)   CKD (chronic kidney disease), stage III (HCC)   Ischemic cardiomyopathy   Biventricular ICD (implantable cardioverter-defibrillator) in place   Essential hypertension   Acute on chronic systolic heart failure (HCC)   Acute CHF (congestive heart failure) (HCC)   Influenza A   ##Acute respiratory failure with hypoxia -Likely combination of CHF and influenza.   -Continue with IV Lasix  -  Closely follow intake output and metabolic panel.   -Negative cardiac markers.  - Last 2D echo done in December 2019 showed EF of 35 to 40%.   -Follow daily weights. -Good improvement from yesterday  ##Congestive heart failure systolic acute on chronic -Echocardiogram done from December 2019 showed EF of 30 to  35% -Also secondary to aortic stenosis critical with valve area of 1.1 cm -Continue with IV Lasix -Good improvement from yesterday  ##Influenza infection - on Tamiflu dosed per pharmacy for flu.   ##Coagulopathy due to Coumadin -INR 10 -Given 5 mg of IV -INR normalized to 1.4 -Start the patient on Coumadin at lower dose  ##Persistent A. fib with history of complete heart block status post pacemaker    -Rate is controlled with the metoprolol -Holding Coumadin secondary to supratherapeutic INR  ##Right shoulder pain -Old trauma involving the lateral right clavicle with remodeling, superior migration of the right humeral head felt to be indicative of a degree of chronic rotator cuff tear. -Patient was experiencing severe pain -Start the patient on Norco, muscle relaxant at bedtime -Good improvement  ##Severe debility -Involved physical therapy -Recommended SNF  ##CAD S/P CABG -COntinue metoprolol,  statins.Marland Kitchen  ##Hypothyroidism -Continue on Synthroid.  ##Hyperlipidemia  -on statins.  ## aortic stenosis  -felt to be a not a candidate for TAVR,  due to comorbidities. -Patient has a valve area of 1.1 cm  ##Anemia follow CBC.  ##Acute on chronic kidney disease stage III  -Cardiorenal syndrome -Continues to have improvement with the diuresis   DVT prophylaxis: Coumadin  code Status: (Full Family Communication: Daughter  disposition Plan: Skilled nursing facility.  Antimicrobials:  Tamiflu 04/19/2018  Subjective: Improved right shoulder pain from yesterday Objective: Vitals:   04/21/18 0630 04/21/18 0800 04/21/18 0849 04/21/18 1142  BP: 108/75 (!) 87/70 108/66 105/70  Pulse: (!) 59 60 60 (!) 59  Resp:  17  (!)  22  Temp:  (!) 97.5 F (36.4 C)  (!) 97.3 F (36.3 C)  TempSrc:  Oral  Oral  SpO2: 93% 91%  91%  Weight: 88.5 kg     Height:        Intake/Output Summary (Last 24 hours) at 04/21/2018 1419 Last data filed at 04/21/2018 1247 Gross per 24 hour  Intake  960 ml  Output 2240 ml  Net -1280 ml   Filed Weights   04/20/18 1400 04/21/18 0630  Weight: 89 kg 88.5 kg    Examination:  General exam: Appears calm and comfortable  Respiratory system: Clear to auscultation. Respiratory effort normal. Cardiovascular system: S1 & S2 heard, RRR. No JVD, murmurs, rubs, gallops or clicks. No pedal edema. Gastrointestinal system: Abdomen is nondistended, soft and nontender. No organomegaly or masses felt. Normal bowel sounds heard. Central nervous system: Alert and oriented. No focal neurological deficits. Extremities: Symmetric 5 x 5 power. Skin: No rashes, lesions or ulcers Psychiatry: Judgement and insight appear normal. Mood & affect appropriate.     Data Reviewed: I have personally reviewed following labs and imaging studies  CBC: Recent Labs  Lab 04/19/18 1753 04/20/18 0131 04/21/18 0448  WBC 5.3 5.0 4.5  NEUTROABS 4.5 3.5  --   HGB 11.2* 12.0* 10.6*  HCT 37.6* 40.6 33.9*  MCV 97.9 98.3 95.0  PLT 179 188 226   Basic Metabolic Panel: Recent Labs  Lab 04/19/18 1753 04/20/18 0131 04/21/18 0448  NA 135 138 134*  K 4.3 4.7 3.8  CL 104 105 103  CO2 20* 19* 22  GLUCOSE 132* 112* 115*  BUN 52* 51* 50*  CREATININE 2.06* 1.96* 1.75*  CALCIUM 9.9 10.3 9.4   GFR: Estimated Creatinine Clearance: 31.7 mL/min (A) (by C-G formula based on SCr of 1.75 mg/dL (H)). Liver Function Tests: Recent Labs  Lab 04/19/18 1753 04/20/18 0131  AST 37 37  ALT 25 26  ALKPHOS 63 69  BILITOT 1.3* 1.6*  PROT 6.7 6.9  ALBUMIN 3.2* 3.2*   Recent Labs  Lab 04/19/18 1753  LIPASE 15   No results for input(s): AMMONIA in the last 168 hours. Coagulation Profile: Recent Labs  Lab 04/19/18 1753 04/20/18 0757 04/20/18 1443 04/21/18 0448  INR 3.1* >10.0* 1.8* 1.4*   Cardiac Enzymes: Recent Labs  Lab 04/20/18 0131 04/20/18 0757 04/20/18 1443  TROPONINI 0.26* 0.24* 0.26*   BNP (last 3 results) No results for input(s): PROBNP in the last  8760 hours. HbA1C: No results for input(s): HGBA1C in the last 72 hours. CBG: No results for input(s): GLUCAP in the last 168 hours. Lipid Profile: No results for input(s): CHOL, HDL, LDLCALC, TRIG, CHOLHDL, LDLDIRECT in the last 72 hours. Thyroid Function Tests: Recent Labs    04/19/18 1753  TSH 1.586  FREET4 1.42   Anemia Panel: No results for input(s): VITAMINB12, FOLATE, FERRITIN, TIBC, IRON, RETICCTPCT in the last 72 hours. Sepsis Labs: Recent Labs  Lab 04/19/18 1813 04/19/18 2121 04/20/18 0131 04/20/18 0309  LATICACIDVEN 1.9 2.4* 2.8* 2.1*    Recent Results (from the past 240 hour(s))  Culture, blood (Routine x 2)     Status: None (Preliminary result)   Collection Time: 04/19/18  5:18 PM  Result Value Ref Range Status   Specimen Description BLOOD RIGHT ANTECUBITAL  Final   Special Requests   Final    BOTTLES DRAWN AEROBIC AND ANAEROBIC Blood Culture results may not be optimal due to an inadequate volume of blood received in culture bottles   Culture  Final    NO GROWTH 2 DAYS Performed at [facility], [place] [address] [place], [place] [address]    Report Status PENDING  Incomplete  MRSA PCR Screening     Status: None   Collection Time: 04/20/18  2:36 PM  Result Value Ref Range Status   MRSA by PCR NEGATIVE NEGATIVE Final    Comment:        The GeneXpert MRSA Assay (FDA approved for NASAL specimens only), is one component of a comprehensive MRSA colonization surveillance program. It is not intended to diagnose MRSA infection nor to guide or monitor treatment for MRSA infections. Performed at [facility], [place] [address], [place], [place] [address]          Radiology Studies: Dg Chest 2 View  Result Date: 04/19/2018 CLINICAL DATA:  Weakness, cough, and lower extremity edema for 4 days. EXAM: CHEST - 2 VIEW COMPARISON:  04/10/2018 FINDINGS: Cardiac pacemaker. Postoperative changes in the mediastinum. Cardiac enlargement. Mild pulmonary  vascular congestion. Suggestion of mild interstitial changes in the lung bases probably early interstitial edema. Probable small left pleural effusion, new since prior study. No pneumothorax or focal consolidation. Calcification of the aorta. Old resection or resorption of the distal clavicles. IMPRESSION: Cardiac enlargement with mild pulmonary vascular congestion and interstitial edema. Probable small left pleural effusion. Electronically Signed   By: Lucienne Capers M.D.   On: 04/19/2018 20:07   Dg Shoulder Right  Result Date: 04/20/2018 CLINICAL DATA:  Chronic pain EXAM: RIGHT SHOULDER - 2+ VIEW COMPARISON:  April 10, 2018 FINDINGS: Oblique and Y scapular images were obtained. There is no acute fracture or dislocation. There is again noted superior migration of the right humeral head. There is evidence of old trauma involving the lateral right clavicle with remodeling. No erosive change. Visualized right lung clear. IMPRESSION: Old trauma involving the lateral right clavicle with remodeling. No acute fracture or dislocation evident. Superior migration of the right humeral head is felt to be indicative of a degree of chronic rotator cuff tear. No erosive change. Appearance similar to recent prior study. Electronically Signed   By: Lowella Grip III M.D.   On: 04/20/2018 07:57   Ct Head Wo Contrast  Result Date: 04/19/2018 CLINICAL DATA:  Acute loss of consciousness. Patient fell this morning. EXAM: CT HEAD WITHOUT CONTRAST TECHNIQUE: Contiguous axial images were obtained from the base of the skull through the vertex without intravenous contrast. COMPARISON:  None. FINDINGS: Brain: Redemonstration of cerebral atrophy with sulcal and ventricular prominence. Chronic moderate small vessel ischemic disease of periventricular and deep white matter. No large vascular territory infarction. No intra-axial mass nor extra-axial fluid. Vascular: Stable right MCA bifurcation partially calcified aneurysm  measuring 12.5 x 10.7 mm, series 3/17. No hyperdense vessel sign. Moderate atherosclerosis of carotid siphons bilaterally. Skull: No acute skull fracture or suspicious osseous lesions. Mild osteopenia at the skull base. Sinuses/Orbits: Intact orbits and globes with bilateral cataract extractions. Mild-to-moderate paranasal sinus mucosal thickening, greatest in the sphenoid sinus with wall thickening of the sphenoid likely representing stigmata of chronic sinusitis. Small mucous retention cysts or polyps are noted within the maxillary sinuses bilaterally. Other: No significant calvarial soft tissue swelling. IMPRESSION: 1. No acute intracranial abnormality. Age related atrophy with moderate small vessel ischemia. 2. Stable right MCA bifurcation aneurysm estimated at 12.5 x 10.7 mm currently, series 3/17. 3. Paranasal sinus mucosal thickening. Electronically Signed   By: Ashley Royalty M.D.   On: 04/19/2018 19:38  Scheduled Meds: . aspirin  81 mg Oral Daily  . furosemide  40 mg Intravenous BID  . levETIRAcetam  500 mg Oral BID  . levothyroxine  50 mcg Oral Q0600  . metoprolol succinate  25 mg Oral Daily  . oseltamivir  30 mg Oral BID  . pantoprazole  40 mg Oral Daily  . potassium chloride  10 mEq Oral Daily  . simvastatin  20 mg Oral QHS  . sodium chloride flush  3 mL Intravenous Once  . tiZANidine  4 mg Oral QHS  . cyanocobalamin  1,000 mcg Oral Daily  . warfarin  7.5 mg Oral ONCE-1800  . Warfarin - Pharmacist Dosing Inpatient   Does not apply q1800   Continuous Infusions:    LOS: 2 days    Time spent: 25 minutes    Tyra Michelle, MD [facility] Pager [phone]  If 7PM-7AM, please contact night-coverage [email or web address] Password [email or web address] 04/21/2018, 2:19 PM

## 2018-04-22 LAB — PROTIME-INR
INR: 1.3 — AB (ref 0.8–1.2)
Prothrombin Time: 16 seconds — ABNORMAL HIGH (ref 11.4–15.2)

## 2018-04-22 LAB — BASIC METABOLIC PANEL
Anion gap: 8 (ref 5–15)
BUN: 47 mg/dL — AB (ref 8–23)
CO2: 25 mmol/L (ref 22–32)
CREATININE: 1.85 mg/dL — AB (ref 0.61–1.24)
Calcium: 9.3 mg/dL (ref 8.9–10.3)
Chloride: 104 mmol/L (ref 98–111)
GFR calc Af Amer: 36 mL/min — ABNORMAL LOW (ref >60)
GFR calc non Af Amer: 31 mL/min — ABNORMAL LOW (ref >60)
Glucose, Bld: 106 mg/dL — ABNORMAL HIGH (ref 70–99)
Potassium: 4 mmol/L (ref 3.5–5.1)
Sodium: 137 mmol/L (ref 135–145)

## 2018-04-22 LAB — CBC
HCT: 35.9 % — ABNORMAL LOW (ref 39.0–52.0)
Hemoglobin: 10.7 g/dL — ABNORMAL LOW (ref 13.0–17.0)
MCH: 28.5 pg (ref 26.0–34.0)
MCHC: 29.8 g/dL — ABNORMAL LOW (ref 30.0–36.0)
MCV: 95.5 fL (ref 80.0–100.0)
Platelets: 196 10*3/uL (ref 150–400)
RBC: 3.76 MIL/uL — ABNORMAL LOW (ref 4.22–5.81)
RDW: 17.1 % — AB (ref 11.5–15.5)
WBC: 3.8 10*3/uL — ABNORMAL LOW (ref 4.0–10.5)
nRBC: 0 % (ref 0.0–0.2)

## 2018-04-22 MED ORDER — WARFARIN SODIUM 3 MG PO TABS
6.0000 mg | ORAL_TABLET | Freq: Once | ORAL | Status: AC
  Administered 2018-04-22: 6 mg via ORAL
  Filled 2018-04-22: qty 2

## 2018-04-22 MED ORDER — CARVEDILOL 12.5 MG PO TABS
12.5000 mg | ORAL_TABLET | Freq: Two times a day (BID) | ORAL | Status: DC
  Administered 2018-04-22 – 2018-04-24 (×5): 12.5 mg via ORAL
  Filled 2018-04-22 (×4): qty 1

## 2018-04-22 NOTE — NC FL2 (Addendum)
[place] LEVEL OF CARE SCREENING TOOL     IDENTIFICATION  Patient Name: Kenneth Bell Birthdate: 07/02/1926 Sex: male Admission Date (Current Location): 04/19/2018  [facility] and [place] Number:  Herbalist and Address:  [facility] [place], [place] [address], [place], [place] [address]      Provider Number: [id number]  Attending Physician Name and Address:  Monica Becton, MD  Relative Name and Phone Number:       Current Level of Care: Hospital Recommended Level of Care: [place] Prior Approval Number:    Date Approved/Denied:   PASRR Number: [id number]   Discharge Plan: Skilled Nursing Facility   Current Diagnoses: Patient Active Problem List   Diagnosis Date Noted  . Influenza A 04/20/2018  . Acute CHF (congestive heart failure) ([place]) 04/19/2018  . Cervical stenosis of spinal canal 04/06/2018  . Chest pain 03/01/2018  . CHF (congestive heart failure) ([place]) 01/25/2018  . Acute respiratory failure with hypoxia ([place]) 01/24/2018  . Acute on chronic systolic heart failure ([place]) 01/23/2018  . Pain in both forearms 01/14/2018  . Cerebral thrombosis with cerebral infarction 02/21/2017  . Cerebral embolism with cerebral infarction 02/21/2017  . Subarachnoid hemorrhage 02/21/2017  . Confusion 02/20/2017  . Brain aneurysm   . Thyroid disease   . Hyperlipidemia with target LDL less than 70   . Essential hypertension   . Coronary artery disease involving native heart with angina pectoris ([place])   . COPD (chronic obstructive pulmonary disease) ([place])   . Aortic stenosis, mild 09/13/2016  . Ischemic cardiomyopathy 08/28/2016  . Moderate pulmonary arterial systolic hypertension ([place]) 08/28/2016  . Mild aortic insufficiency 08/28/2016  . Elevated troponin   . Chronic combined systolic and diastolic heart failure, NYHA class 3 ([place]) 08/26/2016  . CKD (chronic kidney disease), stage III ([place]) 08/26/2016  . Persistent  atrial fibrillation ([place]); CHA2DS2Vasc = ~4 (CHF, MI/aortic plaque, Age 16) 08/21/2016  . Biventricular ICD (implantable cardioverter-defibrillator) in place 09/13/2012  . Coronary artery disease involving coronary bypass graft of native heart with angina pectoris ([place]) 08/26/2012  . Presence of biventricular implantable cardioverter-defibrillator (ICD) 02/11/2006    Orientation RESPIRATION BLADDER Height & Weight     Self, Place, Time  Normal External catheter, Incontinent(placed 04/21/18) Weight: 192 lb 3.2 oz (87.2 kg) Height:  6\' 1"  (185.4 cm)  BEHAVIORAL SYMPTOMS/MOOD NEUROLOGICAL BOWEL NUTRITION STATUS      Continent Diet(heart healthy, thin liquids, Fluid restriction: 1200 mL Fluid )  AMBULATORY STATUS COMMUNICATION OF NEEDS Skin   Limited Assist Verbally Normal                       Personal Care Assistance Level of Assistance  Bathing, Feeding, Dressing Bathing Assistance: Limited assistance Feeding assistance: Independent Dressing Assistance: Limited assistance     Functional Limitations Info  Sight, Hearing, Speech Sight Info: Adequate Hearing Info: Adequate Speech Info: Adequate    SPECIAL CARE FACTORS FREQUENCY  PT (By licensed PT), OT (By licensed OT)     PT Frequency: 3x OT Frequency: 3x            Contractures Contractures Info: Not present    Additional Factors Info  Code Status, Allergies, Isolation Precautions Code Status Info: Full Code Allergies Info: Lipitor Atorvastatin, Nitroglycerin     Isolation Precautions Info: Droplet pre     Current Medications (04/22/2018):  This is the current hospital active medication list Current Facility-Administered Medications  Medication Dose Route Frequency Provider  Last Rate Last Dose  . acetaminophen (TYLENOL) tablet 650 mg  650 mg Oral Q6H PRN Rise Patience, MD   650 mg at 04/22/18 0802   Or  . acetaminophen (TYLENOL) suppository 650 mg  650 mg Rectal Q6H PRN Rise Patience, MD      .  aspirin chewable tablet 81 mg  81 mg Oral Daily Rise Patience, MD   81 mg at 04/22/18 7628  . carvedilol (COREG) tablet 12.5 mg  12.5 mg Oral BID WC Vasireddy, Grier Mitts, MD      . HYDROcodone-acetaminophen (NORCO/VICODIN) 5-325 MG per tablet 1 tablet  1 tablet Oral Q6H PRN Monica Becton, MD      . levETIRAcetam (KEPPRA) tablet 500 mg  500 mg Oral BID Rise Patience, MD   500 mg at 04/22/18 0802  . levothyroxine (SYNTHROID, LEVOTHROID) tablet 50 mcg  50 mcg Oral Q0600 Rise Patience, MD   50 mcg at 04/22/18 0523  . ondansetron (ZOFRAN) tablet 4 mg  4 mg Oral Q6H PRN Rise Patience, MD       Or  . ondansetron [facility]) injection 4 mg  4 mg Intravenous Q6H PRN Rise Patience, MD      . oseltamivir (TAMIFLU) capsule 30 mg  30 mg Oral BID Monica Becton, MD   30 mg at 04/22/18 0803  . pantoprazole (PROTONIX) EC tablet 40 mg  40 mg Oral Daily Rise Patience, MD   40 mg at 04/22/18 0802  . potassium chloride (K-DUR,KLOR-CON) CR tablet 10 mEq  10 mEq Oral Daily Rise Patience, MD   10 mEq at 04/22/18 0802  . simvastatin (ZOCOR) tablet 20 mg  20 mg Oral QHS Rise Patience, MD   20 mg at 04/21/18 2202  . sodium chloride flush (NS) 0.9 % injection 3 mL  3 mL Intravenous Once Rise Patience, MD      . tiZANidine (ZANAFLEX) tablet 4 mg  4 mg Oral QHS Rise Patience, MD   4 mg at 04/21/18 2202  . vitamin B-12 (CYANOCOBALAMIN) tablet 1,000 mcg  1,000 mcg Oral Daily Rise Patience, MD   1,000 mcg at 04/22/18 3151  . warfarin (COUMADIN) tablet 4 mg  4 mg Oral q1800 Monica Becton, MD   4 mg at 04/21/18 1727  . Warfarin - Pharmacist Dosing Inpatient   Does not apply [id number] Rise Patience, MD         Discharge Medications: Please see discharge summary for a list of discharge medications.  Relevant Imaging Results:  Relevant Lab Results:   Additional Information SS#: [id number]  Eileen Stanford, LCSW

## 2018-04-22 NOTE — TOC Initial Note (Signed)
Transition of Care [facility]) - Initial/Assessment Note    Patient Details  Name: Kenneth Bell MRN: [id number] Date of Birth: 11-27-1926  Transition of [facility]) CM/SW Contact:    Eileen Stanford, LCSW Phone Number: 04/22/2018, 11:59 AM  Clinical Narrative:     Pt is disoritented to situation. CSW spoke with pt's daughter Bonnita Nasuti via telephone. Pt's daughter states pt is from [facility] however pt's daughter states she doesn't think they can accomidate him and she believes he will need SNF. Pt's daughter asked for a list for her daughter who is a OT. Pt's daughter will review list with her daughter and follow up with CSW reguarding choice.  Pt's daughter verbalized understanding of CSW role and expressed appreciation for support. Pt's daughter denies any concern regarding pt care at this time.               Expected Discharge Plan: Skilled Nursing Facility Barriers to Discharge: Continued Medical Work up, SNF Pending bed offer   Patient Goals and CMS Choice Patient states their goals for this hospitalization and ongoing recovery are:: to return to ALF CMS Medicare.gov Compare Post Acute Care list provided to:: Patient Represenative (must comment)(daughter) Choice offered to / list presented to : Sibling  Expected Discharge Plan and Services Expected Discharge Plan: [place] Choice: [place] Living arrangements for the past 2 months: [place]                          Prior Living Arrangements/Services Living arrangements for the past 2 months: [place] Lives with:: Self Patient language and need for interpreter reviewed:: No Do you feel safe going back to the place where you live?: Yes      Need for Family Participation in Patient Care: No (Comment) Care giver support system in place?: Yes (comment)(Helen-daughter)   Criminal Activity/Legal Involvement Pertinent to Current  Situation/Hospitalization: No - Comment as needed  Activities of Daily Living   ADL Screening (condition at time of admission) Patient's cognitive ability adequate to safely complete daily activities?: Yes Is the patient deaf or have difficulty hearing?: Yes Does the patient have difficulty seeing, even when wearing glasses/contacts?: No Does the patient have difficulty concentrating, remembering, or making decisions?: No Patient able to express need for assistance with ADLs?: Yes Does the patient have difficulty dressing or bathing?: Yes Independently performs ADLs?: No Does the patient have difficulty walking or climbing stairs?: Yes Weakness of Legs: Both Weakness of Arms/Hands: Both  Permission Sought/Granted Permission sought to share information with : Family Supports    Share Information with NAME: Bonnita Nasuti     Permission granted to share info w Relationship: Daughter     Emotional Assessment Appearance:: Appears stated age Attitude/Demeanor/Rapport: Unable to Assess Affect (typically observed): Unable to Assess Orientation: : Oriented to Self, Oriented to Place, Oriented to  Time Alcohol / Substance Use: Not Applicable Psych Involvement: No (comment)  Admission diagnosis:  Pain [R52] Influenza A [J10.1] Troponin level elevated [R79.89] Acute respiratory failure with hypoxia (HCC) [J96.01] Patient Active Problem List   Diagnosis Date Noted  . Influenza A 04/20/2018  . Acute CHF (congestive heart failure) ([place]) 04/19/2018  . Cervical stenosis of spinal canal 04/06/2018  . Chest pain 03/01/2018  . CHF (congestive heart failure) ([place]) 01/25/2018  . Acute respiratory failure with hypoxia ([place]) 01/24/2018  . Acute on chronic systolic heart failure ([place]) 01/23/2018  .  Pain in both forearms 01/14/2018  . Cerebral thrombosis with cerebral infarction 02/21/2017  . Cerebral embolism with cerebral infarction 02/21/2017  . Subarachnoid hemorrhage 02/21/2017  . Confusion  02/20/2017  . Brain aneurysm   . Thyroid disease   . Hyperlipidemia with target LDL less than 70   . Essential hypertension   . Coronary artery disease involving native heart with angina pectoris ([place])   . COPD (chronic obstructive pulmonary disease) ([place])   . Aortic stenosis, mild 09/13/2016  . Ischemic cardiomyopathy 08/28/2016  . Moderate pulmonary arterial systolic hypertension ([place]) 08/28/2016  . Mild aortic insufficiency 08/28/2016  . Elevated troponin   . Chronic combined systolic and diastolic heart failure, NYHA class 3 ([place]) 08/26/2016  . CKD (chronic kidney disease), stage III ([place]) 08/26/2016  . Persistent atrial fibrillation ([place]); CHA2DS2Vasc = ~4 (CHF, MI/aortic plaque, Age 85) 08/21/2016  . Biventricular ICD (implantable cardioverter-defibrillator) in place 09/13/2012  . Coronary artery disease involving coronary bypass graft of native heart with angina pectoris ([place]) 08/26/2012  . Presence of biventricular implantable cardioverter-defibrillator (ICD) 02/11/2006   PCP:  [name], DO Pharmacy:  No Pharmacies Listed    Social Determinants of Health (SDOH) Interventions    Readmission Risk Interventions 30 Day Unplanned Readmission Risk Score     ED to Hosp-Admission (Current) from 04/19/2018 in [place] CHF  30 Day Unplanned Readmission Risk Score (%)  32 Filed at 04/22/2018 0801     This score is the patient's risk of an unplanned readmission within 30 days of being discharged (0 -100%). The score is based on dignosis, age, lab data, medications, orders, and past utilization.   Low:  0-14.9   Medium: 15-21.9   High: 22-29.9   Extreme: 30 and above       No flowsheet data found.

## 2018-04-22 NOTE — Progress Notes (Signed)
PROGRESS NOTE    Kenneth Bell  DJT:[id number] DOB: Jul 04, 1926 DOA: 04/19/2018 PCP: [name], DO   Brief Narrative: Kenneth Bell is a 85 y.o. male with history of CAD status post CABG, complete heart block status post pacemaker, A. fib, hypertension, chronic kidney disease, aortic stenosis, hypothyroidism was brought to the ER after patient was getting increasingly short of breath peak and slid onto the floor from his wheelchair at the nursing facility.  Patient also has benign productive cough.  Patient is a poor historian not able to contribute much to the history when I asked.  He states he has been feeling weak over the last few days.  Most of the history was obtained from the ER physician and previous records.  ED Course: In the ER patient was hypoxic appears erythematous.  Chest x-ray shows congestion.  Patient was positive for flu started on Tamiflu.  Initially was given fluid bolus.  Lactate was elevated.  BNP was 1468.  On exam patient has significant lower extremity edema.  Assessment & Plan:   Principal Problem:   Acute respiratory failure with hypoxia (HCC) Active Problems:   Persistent atrial fibrillation (HCC); CHA2DS2Vasc = ~4 (CHF, MI/aortic plaque, Age 76)   CKD (chronic kidney disease), stage III (HCC)   Ischemic cardiomyopathy   Biventricular ICD (implantable cardioverter-defibrillator) in place   Essential hypertension   Acute on chronic systolic heart failure (HCC)   Acute CHF (congestive heart failure) (HCC)   Influenza A   ##Acute respiratory failure with hypoxia -Likely combination of CHF and influenza.   -Holding IV Lasix as patient's BUN and creatinine trending up -- Last 2D echo done in December 2019 showed EF of 35 to 40%.   -Follow daily weights. -Good improvement shortness of breath  ##Congestive heart failure systolic acute on chronic -Echocardiogram done from December 2019 showed EF of 30 to 35% -Also secondary to aortic stenosis  critical with valve area of 1.1 cm -Hold IV Lasix considering patient's low normal blood pressure, elevated creatinine -Proved shortness of breath from the time of admission  ##Influenza infection - on Tamiflu dosed per pharmacy for flu.   ##Coagulopathy due to Coumadin -INR 10 -Given 5 mg of IV -INR normalized to 1.4 -Start the patient on Coumadin at lower dose -Follow-up with PT/INR  ##Persistent A. fib with history of complete heart block status post pacemaker    -Rate is controlled with the metoprolol -Holding Coumadin secondary to supratherapeutic INR  ##Right shoulder pain -Old trauma involving the lateral right clavicle with remodeling, superior migration of the right humeral head felt to be indicative of a degree of chronic rotator cuff tear. -Patient was experiencing severe pain at admission -Good improvement on Norco, muscle relaxant at bedtime  ##Severe debility -Involved physical therapy -Recommended SNF  ##CAD S/P CABG -COntinue metoprolol,  statins.Marland Kitchen  ##Hypothyroidism -Continue on Synthroid.  ##Hyperlipidemia  -on statins.  ## aortic stenosis  -felt to be a not a candidate for TAVR,  due to comorbidities. -Patient has a valve area of 1.1 cm  ##Anemia follow CBC.  ##Acute on chronic kidney disease stage III  -Cardiorenal syndrome -Continues to have improvement with the diuresis   DVT prophylaxis: Coumadin  code Status: (Full Family Communication: Attempted to call patient's daughter Bonnita Nasuti, left message disposition Plan: Skilled nursing facility.  Antimicrobials:  Tamiflu 04/19/2018  Subjective: Improved right shoulder pain from yesterday Objective: Vitals:   04/21/18 2024 04/22/18 0049 04/22/18 0059 04/22/18 0523  BP: 125/69 103/64  (!) 30/09  Pulse: 72 60  65  Resp: 20 18  16   Temp: (!) 97.5 F (36.4 C) 98.1 F (36.7 C)  98 F (36.7 C)  TempSrc: Oral Oral  Oral  SpO2: 95% 94%  93%  Weight:   87.2 kg   Height:        Intake/Output  Summary (Last 24 hours) at 04/22/2018 1228 Last data filed at 04/22/2018 0059 Gross per 24 hour  Intake 600 ml  Output 1850 ml  Net -1250 ml   Filed Weights   04/20/18 1400 04/21/18 0630 04/22/18 0059  Weight: 89 kg 88.5 kg 87.2 kg    Examination:  General exam: Appears calm and comfortable  Respiratory system: Clear to auscultation. Respiratory effort normal. Cardiovascular system: S1 & S2 heard, RRR. No JVD, murmurs, rubs, gallops or clicks. No pedal edema. Gastrointestinal system: Abdomen is nondistended, soft and nontender. No organomegaly or masses felt. Normal bowel sounds heard. Central nervous system: Alert and oriented. No focal neurological deficits. Extremities: Symmetric 5 x 5 power. Skin: No rashes, lesions or ulcers Psychiatry: Judgement and insight appear normal. Mood & affect appropriate.     Data Reviewed: I have personally reviewed following labs and imaging studies  CBC: Recent Labs  Lab 04/19/18 1753 04/20/18 0131 04/21/18 0448 04/22/18 0440  WBC 5.3 5.0 4.5 3.8*  NEUTROABS 4.5 3.5  --   --   HGB 11.2* 12.0* 10.6* 10.7*  HCT 37.6* 40.6 33.9* 35.9*  MCV 97.9 98.3 95.0 95.5  PLT 179 188 177 270   Basic Metabolic Panel: Recent Labs  Lab 04/19/18 1753 04/20/18 0131 04/21/18 0448 04/22/18 0440  NA 135 138 134* 137  K 4.3 4.7 3.8 4.0  CL 104 105 103 104  CO2 20* 19* 22 25  GLUCOSE 132* 112* 115* 106*  BUN 52* 51* 50* 47*  CREATININE 2.06* 1.96* 1.75* 1.85*  CALCIUM 9.9 10.3 9.4 9.3   GFR: Estimated Creatinine Clearance: 30 mL/min (A) (by C-G formula based on SCr of 1.85 mg/dL (H)). Liver Function Tests: Recent Labs  Lab 04/19/18 1753 04/20/18 0131  AST 37 37  ALT 25 26  ALKPHOS 63 69  BILITOT 1.3* 1.6*  PROT 6.7 6.9  ALBUMIN 3.2* 3.2*   Recent Labs  Lab 04/19/18 1753  LIPASE 15   No results for input(s): AMMONIA in the last 168 hours. Coagulation Profile: Recent Labs  Lab 04/19/18 1753 04/20/18 0757 04/20/18 1443 04/21/18  0448 04/22/18 0440  INR 3.1* >10.0* 1.8* 1.4* 1.3*   Cardiac Enzymes: Recent Labs  Lab 04/20/18 0131 04/20/18 0757 04/20/18 1443  TROPONINI 0.26* 0.24* 0.26*   BNP (last 3 results) No results for input(s): PROBNP in the last 8760 hours. HbA1C: No results for input(s): HGBA1C in the last 72 hours. CBG: No results for input(s): GLUCAP in the last 168 hours. Lipid Profile: No results for input(s): CHOL, HDL, LDLCALC, TRIG, CHOLHDL, LDLDIRECT in the last 72 hours. Thyroid Function Tests: Recent Labs    04/19/18 1753  TSH 1.586  FREET4 1.42   Anemia Panel: No results for input(s): VITAMINB12, FOLATE, FERRITIN, TIBC, IRON, RETICCTPCT in the last 72 hours. Sepsis Labs: Recent Labs  Lab 04/19/18 1813 04/19/18 2121 04/20/18 0131 04/20/18 0309  LATICACIDVEN 1.9 2.4* 2.8* 2.1*    Recent Results (from the past 240 hour(s))  Culture, blood (Routine x 2)     Status: None (Preliminary result)   Collection Time: 04/19/18  5:18 PM  Result Value Ref Range Status   Specimen Description BLOOD RIGHT  ANTECUBITAL  Final   Special Requests   Final    BOTTLES DRAWN AEROBIC AND ANAEROBIC Blood Culture results may not be optimal due to an inadequate volume of blood received in culture bottles   Culture   Final    NO GROWTH 3 DAYS Performed at [facility], [place] [address] [place], [place] [address]    Report Status PENDING  Incomplete  MRSA PCR Screening     Status: None   Collection Time: 04/20/18  2:36 PM  Result Value Ref Range Status   MRSA by PCR NEGATIVE NEGATIVE Final    Comment:        The GeneXpert MRSA Assay (FDA approved for NASAL specimens only), is one component of a comprehensive MRSA colonization surveillance program. It is not intended to diagnose MRSA infection nor to guide or monitor treatment for MRSA infections. Performed at [facility], [place] [address] [place], [place] [address]          Radiology Studies: No results found.       Scheduled Meds: . aspirin  81 mg Oral Daily  . carvedilol  12.5 mg Oral BID WC  . levETIRAcetam  500 mg Oral BID  . levothyroxine  50 mcg Oral Q0600  . oseltamivir  30 mg Oral BID  . pantoprazole  40 mg Oral Daily  . potassium chloride  10 mEq Oral Daily  . simvastatin  20 mg Oral QHS  . tiZANidine  4 mg Oral QHS  . cyanocobalamin  1,000 mcg Oral Daily  . warfarin  6 mg Oral ONCE-1800  . Warfarin - Pharmacist Dosing Inpatient   Does not apply q1800   Continuous Infusions:    LOS: 3 days    Time spent: 35 minutes    Demesha Boorman, MD [facility] Pager [phone]  If 7PM-7AM, please contact night-coverage [email or web address] Password [facility] 04/22/2018, 12:28 PM

## 2018-04-22 NOTE — Progress Notes (Addendum)
[facility] for Warfarin  Indication: atrial fibrillation  Allergies  Allergen Reactions  . Lipitor [Atorvastatin] Other (See Comments)    Unknown reaction  . Nitroglycerin Other (See Comments)    DROPS BLOOD PRESSURE DRASTICALLY    Vital Signs: Temp: 98 F (36.7 C) (03/11 0523) Temp Source: Oral (03/11 0523) BP: 96/58 (03/11 0523) Pulse Rate: 65 (03/11 0523)  Labs: Recent Labs    04/20/18 0131 04/20/18 0757 04/20/18 1443 04/21/18 0448 04/22/18 0440  HGB 12.0*  --   --  10.6* 10.7*  HCT 40.6  --   --  33.9* 35.9*  PLT 188  --   --  177 196  LABPROT  --  >90.0* 20.5* 16.5* 16.0*  INR  --  >10.0* 1.8* 1.4* 1.3*  CREATININE 1.96*  --   --  1.75* 1.85*  TROPONINI 0.26* 0.24* 0.26*  --   --     Estimated Creatinine Clearance: 30 mL/min (A) (by C-G formula based on SCr of 1.85 mg/dL (H)).  Assessment: 85 y/o M from nursing facility with weakness, cough, and LE edema. On warfarin PTA for afib. INR just above goal on admission at 3.1.  INR on 3/9 reported as > 10 (up from 3.1 with no warfarin given), not sure if lab was drawn correctly, now INR down to 1.3 s/p 5 mg of Vitamin K  Dose prior to admission = 7.5 mg MF, 6 mg other days  Goal of Therapy:  INR 2-3 Monitor platelets by anticoagulation protocol: Yes   Plan:  Warfarin 6 mg po x 1 dose tonight Daily INR  Thank you Lavena Bullion, PharmD Candidate  I discussed / reviewed the pharmacy note by Mr. Theda Sers and I agree with the resident's findings and plans as documented.  Thank you Anette Guarneri, PharmD [phone]  04/22/2018,10:11 AM

## 2018-04-23 LAB — BASIC METABOLIC PANEL
Anion gap: 8 (ref 5–15)
BUN: 45 mg/dL — ABNORMAL HIGH (ref 8–23)
CO2: 23 mmol/L (ref 22–32)
Calcium: 9.2 mg/dL (ref 8.9–10.3)
Chloride: 105 mmol/L (ref 98–111)
Creatinine, Ser: 1.86 mg/dL — ABNORMAL HIGH (ref 0.61–1.24)
GFR calc Af Amer: 36 mL/min — ABNORMAL LOW (ref >60)
GFR, EST NON AFRICAN AMERICAN: 31 mL/min — AB (ref >60)
Glucose, Bld: 112 mg/dL — ABNORMAL HIGH (ref 70–99)
Potassium: 3.9 mmol/L (ref 3.5–5.1)
Sodium: 136 mmol/L (ref 135–145)

## 2018-04-23 LAB — CBC
HCT: 35.8 % — ABNORMAL LOW (ref 39.0–52.0)
Hemoglobin: 10.7 g/dL — ABNORMAL LOW (ref 13.0–17.0)
MCH: 28.9 pg (ref 26.0–34.0)
MCHC: 29.9 g/dL — ABNORMAL LOW (ref 30.0–36.0)
MCV: 96.8 fL (ref 80.0–100.0)
Platelets: 186 10*3/uL (ref 150–400)
RBC: 3.7 MIL/uL — AB (ref 4.22–5.81)
RDW: 17.1 % — ABNORMAL HIGH (ref 11.5–15.5)
WBC: 4.1 10*3/uL (ref 4.0–10.5)
nRBC: 0 % (ref 0.0–0.2)

## 2018-04-23 LAB — PROTIME-INR
INR: 1.5 — ABNORMAL HIGH (ref 0.8–1.2)
Prothrombin Time: 18 seconds — ABNORMAL HIGH (ref 11.4–15.2)

## 2018-04-23 MED ORDER — WARFARIN SODIUM 3 MG PO TABS
6.0000 mg | ORAL_TABLET | Freq: Once | ORAL | Status: AC
  Administered 2018-04-23: 6 mg via ORAL
  Filled 2018-04-23: qty 2

## 2018-04-23 NOTE — Progress Notes (Signed)
Physical Therapy Treatment Patient Details Name: Kenneth Bell MRN: [id number] DOB: [birth year] Today's Date: 04/23/2018    History of Present Illness 85 y.o. male with history of CAD status post CABG, complete heart block status post pacemaker, A. fib, hypertension, chronic kidney disease, aortic stenosis, hypothyroidism was brought to the ER after patient was getting increasingly short of breath peak and slid onto the floor from his wheelchair at the nursing facility.  Patient also has benign productive cough.      PT Comments    Pt was seen for mobility after going to the bathroom and then back to bed, very tired from the effort.  Pt is weak and tends to look buckled at the knees after walking across the room.  He is interested in rehab as he is aware family will not be able to assist him fully.  Follow acutely to work on LE stiffness and weakness, esp as DF ankles is 0 deg, and progress standing balance control to LRAD.  Pt is normally able to walk with a lesser device but will need help for this transition back to more independence.   Follow Up Recommendations  SNF     Equipment Recommendations  None recommended by PT    Recommendations for Other Services       Precautions / Restrictions Precautions Precautions: Fall Precaution Comments: DROPLET Restrictions Weight Bearing Restrictions: No    Mobility  Bed Mobility               General bed mobility comments: was not in bed when PT arrived and placed in a chair  Transfers Overall transfer level: Needs assistance Equipment used: Rolling walker (2 wheeled) Transfers: Sit to/from Stand Sit to Stand: Min assist            Ambulation/Gait Ambulation/Gait assistance: Min assist Gait Distance (Feet): 45 Feet Assistive device: Rolling walker (2 wheeled) Gait Pattern/deviations: Step-through pattern;Decreased stride length;Wide base of support Gait velocity: reduced   General Gait Details: reminders for direction  on walker, for safety around obstacles   Stairs             Wheelchair Mobility    Modified Rankin (Stroke Patients Only)       Balance Overall balance assessment: Needs assistance Sitting-balance support: Feet supported Sitting balance-Leahy Scale: Fair     Standing balance support: Bilateral upper extremity supported;During functional activity Standing balance-Leahy Scale: Poor                              Cognition Arousal/Alertness: Awake/alert Behavior During Therapy: WFL for tasks assessed/performed Overall Cognitive Status: Within Functional Limits for tasks assessed                                        Exercises General Exercises - Lower Extremity Ankle Circles/Pumps: AROM;Both;5 reps    General Comments        Pertinent Vitals/Pain Pain Assessment: No/denies pain    Home Living                      Prior Function            PT Goals (current goals can now be found in the care plan section) Progress towards PT goals: Progressing toward goals    Frequency    Min 3X/week  PT Plan Current plan remains appropriate    Co-evaluation              AM-PAC PT "6 Clicks" Mobility   Outcome Measure  Help needed turning from your back to your side while in a flat bed without using bedrails?: A Little Help needed moving from lying on your back to sitting on the side of a flat bed without using bedrails?: A Little Help needed moving to and from a bed to a chair (including a wheelchair)?: A Little Help needed standing up from a chair using your arms (e.g., wheelchair or bedside chair)?: A Little Help needed to walk in hospital room?: A Lot Help needed climbing 3-5 steps with a railing? : A Lot 6 Click Score: 16    End of Session Equipment Utilized During Treatment: Gait belt Activity Tolerance: Patient limited by fatigue Patient left: in chair;with call bell/phone within reach;with chair alarm  set Nurse Communication: Mobility status PT Visit Diagnosis: Unsteadiness on feet (R26.81);Muscle weakness (generalized) (M62.81)     Time: 2956-2130 PT Time Calculation (min) (ACUTE ONLY): 30 min  Charges:  $Gait Training: 8-22 mins $Therapeutic Exercise: 8-22 mins                    Ramond Dial 04/23/2018, 5:13 PM  Mee Hives, PT MS Acute Rehab Dept. Number: [place] and [place]

## 2018-04-23 NOTE — Progress Notes (Signed)
PROGRESS NOTE    Kenneth Bell  ACZ:[id number] DOB: 1926/03/31 DOA: 04/19/2018 PCP: [name], DO   Brief Narrative: Kenneth Bell is a 85 y.o. male with history of CAD status post CABG, complete heart block status post pacemaker, A. fib, hypertension, chronic kidney disease, aortic stenosis, hypothyroidism was brought to the ER after patient was getting increasingly short of breath peak and slid onto the floor from his wheelchair at the nursing facility.  Patient also has benign productive cough.  Patient is a poor historian not able to contribute much to the history when I asked.  He states he has been feeling weak over the last few days.  Most of the history was obtained from the ER physician and previous records.  ED Course: In the ER patient was hypoxic appears erythematous.  Chest x-ray shows congestion.  Patient was positive for flu started on Tamiflu.  Initially was given fluid bolus.  Lactate was elevated.  BNP was 1468.  On exam patient has significant lower extremity edema.  Assessment & Plan:   Principal Problem:   Acute respiratory failure with hypoxia (HCC) Active Problems:   Persistent atrial fibrillation (HCC); CHA2DS2Vasc = ~4 (CHF, MI/aortic plaque, Age 88)   CKD (chronic kidney disease), stage III (HCC)   Ischemic cardiomyopathy   Biventricular ICD (implantable cardioverter-defibrillator) in place   Essential hypertension   Acute on chronic systolic heart failure (HCC)   Acute CHF (congestive heart failure) (HCC)   Influenza A   ##Acute respiratory failure with hypoxia -Likely combination of CHF and influenza.   -Holding IV Lasix as patient's BUN and creatinine trending up -- Last 2D echo done in December 2019 showed EF of 35 to 40%.   -Follow daily weights. -Good improvement shortness of breath  ##Congestive heart failure systolic acute on chronic -Echocardiogram done from December 2019 showed EF of 30 to 35% -Also secondary to aortic stenosis  critical with valve area of 1.1 cm -Hold IV Lasix considering patient's low normal blood pressure, elevated creatinine -Improved shortness of breath from the time of admission  ##Influenza infection - on Tamiflu dosed per pharmacy for flu. -SNF once patient to continue with Tamiflu for total of 5 days.  ##Coagulopathy due to Coumadin -INR 10 -Given 5 mg of IV -INR normalized to 1.4 -Start the patient on Coumadin at lower dose -Follow-up with PT/INR-1.5 today  ##Persistent A. fib with history of complete heart block status post pacemaker    -Rate is controlled with the metoprolol -On Coumadin 4 mg -Follow-up with PT/INR  ##Right shoulder pain -Old trauma involving the lateral right clavicle with remodeling, superior migration of the right humeral head felt to be indicative of a degree of chronic rotator cuff tear. -Patient was experiencing severe pain at admission -Good improvement on Norco, muscle relaxant at bedtime  ##Severe debility -Involved physical therapy -Recommended SNF  ##CAD S/P CABG -COntinue metoprolol,  statins.Marland Kitchen  ##Hypothyroidism -Continue on Synthroid.  ##Hyperlipidemia  -on statins.  ## aortic stenosis  -felt to be a not a candidate for TAVR,  due to comorbidities. -Patient has a valve area of 1.1 cm  ##Anemia follow CBC.  ##Acute on chronic kidney disease stage III  -Cardiorenal syndrome -Continues to have improvement with the diuresis   DVT prophylaxis: Coumadin  code Status: (Full Family Communication: Attempted to call patient's daughter Bonnita Nasuti, left message disposition Plan: Skilled nursing facility on 04/25/2018.  Antimicrobials:  Tamiflu 04/19/2018  Subjective: Improved right shoulder pain from yesterday Objective: Vitals:   04/22/18  1924 04/23/18 0420 04/23/18 0500 04/23/18 1025  BP: 128/64 118/75  99/70  Pulse: (!) 51 60  60  Resp: 18 18    Temp: (!) 97.2 F (36.2 C) (!) 97.3 F (36.3 C)    TempSrc: Oral Oral    SpO2: 93% 95%   95%  Weight:   86.5 kg   Height:        Intake/Output Summary (Last 24 hours) at 04/23/2018 1357 Last data filed at 04/23/2018 1337 Gross per 24 hour  Intake 1080 ml  Output 1725 ml  Net -645 ml   Filed Weights   04/21/18 0630 04/22/18 0059 04/23/18 0500  Weight: 88.5 kg 87.2 kg 86.5 kg    Examination:  General exam: Appears calm and comfortable  Respiratory system: Clear to auscultation. Respiratory effort normal. Cardiovascular system: S1 & S2 heard, RRR. No JVD, murmurs, rubs, gallops or clicks. No pedal edema. Gastrointestinal system: Abdomen is nondistended, soft and nontender. No organomegaly or masses felt. Normal bowel sounds heard. Central nervous system: Alert and oriented. No focal neurological deficits. Extremities: Symmetric 5 x 5 power improved lower extremity edema. Skin: No rashes, lesions or ulcers Psychiatry: Judgement and insight appear normal. Mood & affect appropriate.     Data Reviewed: I have personally reviewed following labs and imaging studies  CBC: Recent Labs  Lab 04/19/18 1753 04/20/18 0131 04/21/18 0448 04/22/18 0440 04/23/18 0616  WBC 5.3 5.0 4.5 3.8* 4.1  NEUTROABS 4.5 3.5  --   --   --   HGB 11.2* 12.0* 10.6* 10.7* 10.7*  HCT 37.6* 40.6 33.9* 35.9* 35.8*  MCV 97.9 98.3 95.0 95.5 96.8  PLT 179 188 177 196 562   Basic Metabolic Panel: Recent Labs  Lab 04/19/18 1753 04/20/18 0131 04/21/18 0448 04/22/18 0440 04/23/18 0616  NA 135 138 134* 137 136  K 4.3 4.7 3.8 4.0 3.9  CL 104 105 103 104 105  CO2 20* 19* 22 25 23   GLUCOSE 132* 112* 115* 106* 112*  BUN 52* 51* 50* 47* 45*  CREATININE 2.06* 1.96* 1.75* 1.85* 1.86*  CALCIUM 9.9 10.3 9.4 9.3 9.2   GFR: Estimated Creatinine Clearance: 29.8 mL/min (A) (by C-G formula based on SCr of 1.86 mg/dL (H)). Liver Function Tests: Recent Labs  Lab 04/19/18 1753 04/20/18 0131  AST 37 37  ALT 25 26  ALKPHOS 63 69  BILITOT 1.3* 1.6*  PROT 6.7 6.9  ALBUMIN 3.2* 3.2*   Recent Labs   Lab 04/19/18 1753  LIPASE 15   No results for input(s): AMMONIA in the last 168 hours. Coagulation Profile: Recent Labs  Lab 04/20/18 0757 04/20/18 1443 04/21/18 0448 04/22/18 0440 04/23/18 0616  INR >10.0* 1.8* 1.4* 1.3* 1.5*   Cardiac Enzymes: Recent Labs  Lab 04/20/18 0131 04/20/18 0757 04/20/18 1443  TROPONINI 0.26* 0.24* 0.26*   BNP (last 3 results) No results for input(s): PROBNP in the last 8760 hours. HbA1C: No results for input(s): HGBA1C in the last 72 hours. CBG: No results for input(s): GLUCAP in the last 168 hours. Lipid Profile: No results for input(s): CHOL, HDL, LDLCALC, TRIG, CHOLHDL, LDLDIRECT in the last 72 hours. Thyroid Function Tests: No results for input(s): TSH, T4TOTAL, FREET4, T3FREE, THYROIDAB in the last 72 hours. Anemia Panel: No results for input(s): VITAMINB12, FOLATE, FERRITIN, TIBC, IRON, RETICCTPCT in the last 72 hours. Sepsis Labs: Recent Labs  Lab 04/19/18 1813 04/19/18 2121 04/20/18 0131 04/20/18 0309  LATICACIDVEN 1.9 2.4* 2.8* 2.1*    Recent Results (from the past  240 hour(s))  Culture, blood (Routine x 2)     Status: None (Preliminary result)   Collection Time: 04/19/18  5:18 PM  Result Value Ref Range Status   Specimen Description BLOOD RIGHT ANTECUBITAL  Final   Special Requests   Final    BOTTLES DRAWN AEROBIC AND ANAEROBIC Blood Culture results may not be optimal due to an inadequate volume of blood received in culture bottles   Culture   Final    NO GROWTH 4 DAYS Performed at [facility], [place] [address] [place], [place] [address]    Report Status PENDING  Incomplete  MRSA PCR Screening     Status: None   Collection Time: 04/20/18  2:36 PM  Result Value Ref Range Status   MRSA by PCR NEGATIVE NEGATIVE Final    Comment:        The GeneXpert MRSA Assay (FDA approved for NASAL specimens only), is one component of a comprehensive MRSA colonization surveillance program. It is not intended to diagnose  MRSA infection nor to guide or monitor treatment for MRSA infections. Performed at [facility], [place] [address], [place], [place] [address]          Radiology Studies: No results found.      Scheduled Meds: . aspirin  81 mg Oral Daily  . carvedilol  12.5 mg Oral BID WC  . levETIRAcetam  500 mg Oral BID  . levothyroxine  50 mcg Oral Q0600  . oseltamivir  30 mg Oral BID  . pantoprazole  40 mg Oral Daily  . potassium chloride  10 mEq Oral Daily  . simvastatin  20 mg Oral QHS  . tiZANidine  4 mg Oral QHS  . cyanocobalamin  1,000 mcg Oral Daily  . warfarin  6 mg Oral ONCE-1800  . Warfarin - Pharmacist Dosing Inpatient   Does not apply q1800   Continuous Infusions:    LOS: 4 days    Time spent: 43 minutes    Marissa Lowrey, MD [facility] Pager [phone]  If 7PM-7AM, please contact night-coverage [email or web address] Password [facility] 04/23/2018, 1:57 PM

## 2018-04-23 NOTE — Consult Note (Signed)
   [facility] CM Inpatient Consult   04/23/2018  Kenneth Bell 01/20/1927 [id number]   Patient screened for extreme high risk score and hospitalizations to check if potential [facility] services are needed . Patient was hospitalized for Influenza A, chart reviewed and per MD progress notes 04/22/2018 that the patient is  85 y/o M from nursing facility with weakness, cough, and LE edema. On warfarin PTA for A fib. Patient and family are pursuing skilled nursing for ongoing care before returning to [facility] per [facility] notes. Patient will have needs met at the facility.  Patient is not oriented to situation and daughter Bonnita Nasuti is his main contact per chart review.  Will close.  Natividad Brood, RN BSN [facility] Liaison  [phone] business mobile phone Toll free office [phone]         P

## 2018-04-23 NOTE — Progress Notes (Signed)
[place] for Warfarin  Indication: atrial fibrillation  Allergies  Allergen Reactions  . Lipitor [Atorvastatin] Other (See Comments)    Unknown reaction  . Nitroglycerin Other (See Comments)    DROPS BLOOD PRESSURE DRASTICALLY    Vital Signs: Temp: 97.3 F (36.3 C) (03/12 0420) Temp Source: Oral (03/12 0420) BP: 99/70 (03/12 1025) Pulse Rate: 60 (03/12 1025)  Labs: Recent Labs    04/20/18 1443  04/21/18 0448 04/22/18 0440 04/23/18 0616  HGB  --    < > 10.6* 10.7* 10.7*  HCT  --   --  33.9* 35.9* 35.8*  PLT  --   --  177 196 186  LABPROT 20.5*  --  16.5* 16.0* 18.0*  INR 1.8*  --  1.4* 1.3* 1.5*  CREATININE  --   --  1.75* 1.85* 1.86*  TROPONINI 0.26*  --   --   --   --    < > = values in this interval not displayed.    Estimated Creatinine Clearance: 29.8 mL/min (A) (by C-G formula based on SCr of 1.86 mg/dL (H)).  Assessment: 85 y/o M from nursing facility with weakness, cough, and LE edema. Afib on Coumadin 6mg  daily exc for 7.5mg  on MF PTA. INR on admission = 3.1. INR up to 1.5. Hgb 10.7, plts wnl.  Goal of Therapy:  INR 2-3 Monitor platelets by anticoagulation protocol: Yes   Plan:  Give Coumadin 6mg  PO x 1 Monitor daily INR, CBC, s/s of bleed  Elenor Quinones, PharmD, BCPS, [facility] Clinical Pharmacist Phone number [phone] 04/23/2018 12:41 PM

## 2018-04-23 NOTE — TOC Progression Note (Addendum)
Transition of Care [facility]) - Progression Note    Patient Details  Name: Kenneth Bell MRN: [id number] Date of Birth: 15-Jul-1926  [facility]) CM/SW [place], LCSW Phone Number: [phone] 04/23/2018, 10:24 AM  Clinical Narrative:   Received call from daughter. First preference SNF is [facility], second preference is [facility]. They have not responded to referrals yet. Left message with both admissions coordinators to check bed availability. Daughter understands that these facilities are typically full. Provided list of other bed offers. [facility] is first preference of these. Will follow up with daughter later today.  12:28 pm: [facility] is full. No response from [facility] yet. [facility] has a bed when patient is ready. Daughter is aware and agreeable. Patient has to be on Tamiflu a total of 5 days before he can admit (per their infection nurse). It was started 3/10. Sent MD a message to notify.   2:06 pm: Per pharmacy, patient started Tamiflu on 3/8. Last dose will be tonight. Patient can discharge to SNF tomorrow. Admissions coordinator and daughter aware. Sent MD a message to notify.  Expected Discharge Plan: [place] Barriers to Discharge: Continued Medical Work up, SNF Pending bed offer  Expected Discharge Plan and Services Expected Discharge Plan: [place] Choice: [place] Living arrangements for the past 2 months: [place]                           Social Determinants of Health (SDOH) Interventions    Readmission Risk Interventions 30 Day Unplanned Readmission Risk Score     ED to Hosp-Admission (Current) from 04/19/2018 in [place] [facility]F  30 Day Unplanned Readmission Risk Score (%)  32 Filed at 04/23/2018 0800     This score is the patient's risk of an unplanned readmission within 30 days of being discharged (0 -100%). The score  is based on dignosis, age, lab data, medications, orders, and past utilization.   Low:  0-14.9   Medium: 15-21.9   High: 22-29.9   Extreme: 30 and above       No flowsheet data found.

## 2018-04-24 ENCOUNTER — Telehealth: Payer: Self-pay | Admitting: *Deleted

## 2018-04-24 DIAGNOSIS — M25511 Pain in right shoulder: Secondary | ICD-10-CM | POA: Diagnosis not present

## 2018-04-24 DIAGNOSIS — Z5181 Encounter for therapeutic drug level monitoring: Secondary | ICD-10-CM | POA: Diagnosis not present

## 2018-04-24 DIAGNOSIS — J208 Acute bronchitis due to other specified organisms: Secondary | ICD-10-CM | POA: Diagnosis not present

## 2018-04-24 DIAGNOSIS — K219 Gastro-esophageal reflux disease without esophagitis: Secondary | ICD-10-CM | POA: Diagnosis not present

## 2018-04-24 DIAGNOSIS — R5381 Other malaise: Secondary | ICD-10-CM | POA: Diagnosis not present

## 2018-04-24 DIAGNOSIS — B349 Viral infection, unspecified: Secondary | ICD-10-CM | POA: Diagnosis not present

## 2018-04-24 DIAGNOSIS — I671 Cerebral aneurysm, nonruptured: Secondary | ICD-10-CM | POA: Diagnosis not present

## 2018-04-24 DIAGNOSIS — Z66 Do not resuscitate: Secondary | ICD-10-CM | POA: Diagnosis not present

## 2018-04-24 DIAGNOSIS — R2689 Other abnormalities of gait and mobility: Secondary | ICD-10-CM | POA: Diagnosis not present

## 2018-04-24 DIAGNOSIS — R1311 Dysphagia, oral phase: Secondary | ICD-10-CM | POA: Diagnosis not present

## 2018-04-24 DIAGNOSIS — R41841 Cognitive communication deficit: Secondary | ICD-10-CM | POA: Diagnosis not present

## 2018-04-24 DIAGNOSIS — I480 Paroxysmal atrial fibrillation: Secondary | ICD-10-CM | POA: Diagnosis not present

## 2018-04-24 DIAGNOSIS — Z7401 Bed confinement status: Secondary | ICD-10-CM | POA: Diagnosis not present

## 2018-04-24 DIAGNOSIS — I5021 Acute systolic (congestive) heart failure: Secondary | ICD-10-CM | POA: Diagnosis not present

## 2018-04-24 DIAGNOSIS — Z9981 Dependence on supplemental oxygen: Secondary | ICD-10-CM | POA: Diagnosis not present

## 2018-04-24 DIAGNOSIS — I10 Essential (primary) hypertension: Secondary | ICD-10-CM | POA: Diagnosis not present

## 2018-04-24 DIAGNOSIS — I442 Atrioventricular block, complete: Secondary | ICD-10-CM | POA: Diagnosis not present

## 2018-04-24 DIAGNOSIS — Z951 Presence of aortocoronary bypass graft: Secondary | ICD-10-CM | POA: Diagnosis not present

## 2018-04-24 DIAGNOSIS — D6489 Other specified anemias: Secondary | ICD-10-CM | POA: Diagnosis not present

## 2018-04-24 DIAGNOSIS — J449 Chronic obstructive pulmonary disease, unspecified: Secondary | ICD-10-CM | POA: Diagnosis not present

## 2018-04-24 DIAGNOSIS — I2510 Atherosclerotic heart disease of native coronary artery without angina pectoris: Secondary | ICD-10-CM | POA: Diagnosis not present

## 2018-04-24 DIAGNOSIS — I255 Ischemic cardiomyopathy: Secondary | ICD-10-CM | POA: Diagnosis not present

## 2018-04-24 DIAGNOSIS — E785 Hyperlipidemia, unspecified: Secondary | ICD-10-CM | POA: Diagnosis not present

## 2018-04-24 DIAGNOSIS — R5383 Other fatigue: Secondary | ICD-10-CM | POA: Diagnosis not present

## 2018-04-24 DIAGNOSIS — R2681 Unsteadiness on feet: Secondary | ICD-10-CM | POA: Diagnosis not present

## 2018-04-24 DIAGNOSIS — N178 Other acute kidney failure: Secondary | ICD-10-CM | POA: Diagnosis not present

## 2018-04-24 DIAGNOSIS — R600 Localized edema: Secondary | ICD-10-CM | POA: Diagnosis not present

## 2018-04-24 DIAGNOSIS — Z7901 Long term (current) use of anticoagulants: Secondary | ICD-10-CM | POA: Diagnosis not present

## 2018-04-24 DIAGNOSIS — J9601 Acute respiratory failure with hypoxia: Secondary | ICD-10-CM | POA: Diagnosis not present

## 2018-04-24 DIAGNOSIS — R52 Pain, unspecified: Secondary | ICD-10-CM | POA: Diagnosis not present

## 2018-04-24 DIAGNOSIS — Z79899 Other long term (current) drug therapy: Secondary | ICD-10-CM | POA: Diagnosis not present

## 2018-04-24 DIAGNOSIS — R0600 Dyspnea, unspecified: Secondary | ICD-10-CM | POA: Diagnosis not present

## 2018-04-24 DIAGNOSIS — J111 Influenza due to unidentified influenza virus with other respiratory manifestations: Secondary | ICD-10-CM | POA: Diagnosis not present

## 2018-04-24 DIAGNOSIS — Z96653 Presence of artificial knee joint, bilateral: Secondary | ICD-10-CM | POA: Diagnosis not present

## 2018-04-24 DIAGNOSIS — M6281 Muscle weakness (generalized): Secondary | ICD-10-CM | POA: Diagnosis not present

## 2018-04-24 DIAGNOSIS — N183 Chronic kidney disease, stage 3 (moderate): Secondary | ICD-10-CM | POA: Diagnosis not present

## 2018-04-24 DIAGNOSIS — R0902 Hypoxemia: Secondary | ICD-10-CM | POA: Diagnosis not present

## 2018-04-24 DIAGNOSIS — R4189 Other symptoms and signs involving cognitive functions and awareness: Secondary | ICD-10-CM | POA: Diagnosis not present

## 2018-04-24 DIAGNOSIS — M12811 Other specific arthropathies, not elsewhere classified, right shoulder: Secondary | ICD-10-CM | POA: Diagnosis not present

## 2018-04-24 DIAGNOSIS — S46001A Unspecified injury of muscle(s) and tendon(s) of the rotator cuff of right shoulder, initial encounter: Secondary | ICD-10-CM | POA: Diagnosis not present

## 2018-04-24 DIAGNOSIS — J188 Other pneumonia, unspecified organism: Secondary | ICD-10-CM | POA: Diagnosis not present

## 2018-04-24 DIAGNOSIS — I5042 Chronic combined systolic (congestive) and diastolic (congestive) heart failure: Secondary | ICD-10-CM | POA: Diagnosis not present

## 2018-04-24 DIAGNOSIS — I5189 Other ill-defined heart diseases: Secondary | ICD-10-CM | POA: Diagnosis not present

## 2018-04-24 DIAGNOSIS — I4819 Other persistent atrial fibrillation: Secondary | ICD-10-CM | POA: Diagnosis not present

## 2018-04-24 DIAGNOSIS — M2550 Pain in unspecified joint: Secondary | ICD-10-CM | POA: Diagnosis not present

## 2018-04-24 DIAGNOSIS — Z7189 Other specified counseling: Secondary | ICD-10-CM | POA: Diagnosis not present

## 2018-04-24 DIAGNOSIS — Z9181 History of falling: Secondary | ICD-10-CM | POA: Diagnosis not present

## 2018-04-24 DIAGNOSIS — E039 Hypothyroidism, unspecified: Secondary | ICD-10-CM | POA: Diagnosis not present

## 2018-04-24 DIAGNOSIS — E038 Other specified hypothyroidism: Secondary | ICD-10-CM | POA: Diagnosis not present

## 2018-04-24 DIAGNOSIS — R0602 Shortness of breath: Secondary | ICD-10-CM | POA: Diagnosis not present

## 2018-04-24 DIAGNOSIS — R05 Cough: Secondary | ICD-10-CM | POA: Diagnosis not present

## 2018-04-24 DIAGNOSIS — R1310 Dysphagia, unspecified: Secondary | ICD-10-CM | POA: Diagnosis not present

## 2018-04-24 DIAGNOSIS — J101 Influenza due to other identified influenza virus with other respiratory manifestations: Secondary | ICD-10-CM | POA: Diagnosis not present

## 2018-04-24 DIAGNOSIS — I130 Hypertensive heart and chronic kidney disease with heart failure and stage 1 through stage 4 chronic kidney disease, or unspecified chronic kidney disease: Secondary | ICD-10-CM | POA: Diagnosis not present

## 2018-04-24 DIAGNOSIS — G4089 Other seizures: Secondary | ICD-10-CM | POA: Diagnosis not present

## 2018-04-24 DIAGNOSIS — Z95810 Presence of automatic (implantable) cardiac defibrillator: Secondary | ICD-10-CM | POA: Diagnosis not present

## 2018-04-24 LAB — CULTURE, BLOOD (ROUTINE X 2): Culture: NO GROWTH

## 2018-04-24 LAB — PROTIME-INR
INR: 1.7 — ABNORMAL HIGH (ref 0.8–1.2)
Prothrombin Time: 20.1 seconds — ABNORMAL HIGH (ref 11.4–15.2)

## 2018-04-24 MED ORDER — TIZANIDINE HCL 4 MG PO TABS: 4.0000 mg | ORAL_TABLET | Freq: Every day | ORAL | 0 refills | Status: AC

## 2018-04-24 MED ORDER — DOXYCYCLINE HYCLATE 50 MG PO CAPS: 50.0000 mg | ORAL_CAPSULE | Freq: Two times a day (BID) | ORAL | 0 refills | Status: AC

## 2018-04-24 MED ORDER — WARFARIN SODIUM 7.5 MG PO TABS: 7.5000 mg | ORAL_TABLET | Freq: Once | ORAL | Status: DC

## 2018-04-24 MED ORDER — HYDROCODONE-ACETAMINOPHEN 5-325 MG PO TABS: 1.0000 | ORAL_TABLET | Freq: Three times a day (TID) | ORAL | 0 refills | Status: AC | PRN

## 2018-04-24 MED ORDER — PREDNISONE 20 MG PO TABS: ORAL_TABLET | ORAL | 0 refills | Status: AC

## 2018-04-24 MED ORDER — CARVEDILOL 12.5 MG PO TABS: 12.5000 mg | ORAL_TABLET | Freq: Two times a day (BID) | ORAL | Status: AC

## 2018-04-24 NOTE — Progress Notes (Signed)
Tried to call report to [facility] to [place] and transfered the call,somebody picked up the phone and I was placed on hold again. Patient is ready for transport.Awaiting [facility] for transport.

## 2018-04-24 NOTE — TOC Transition Note (Signed)
Transition of Care [facility]) - CM/SW Discharge Note   Patient Details  Name: Kenneth Bell MRN: [id number] Date of Birth: [birth year]  Transition of Care [facility]) CM/SW Contact:  Candie Chroman, LCSW Phone Number: 04/24/2018, 1:33 PM   Clinical Narrative:   CSW facilitated patient discharge including contacting patient family (Daughter Bonnita Nasuti) and facility to confirm patient discharge plans. Clinical information faxed to facility and family agreeable with plan. CSW arranged ambulance transport via [facility] to [facility]. RN to call report prior to discharge [phone] Room [id number]).  CSW will sign off for now as social work intervention is no longer needed. Please consult [place] again if new needs arise.   Final next level of care: Skilled Nursing Facility Barriers to Discharge: Barriers Resolved   Patient Goals and CMS Choice Patient states their goals for this hospitalization and ongoing recovery are:: to return to ALF CMS Medicare.gov Compare Post Acute Care list provided to:: Patient Represenative (must comment)(Daughter) Choice offered to / list presented to : Adult Children  Discharge Placement   Existing PASRR number confirmed : 04/15/18          Patient chooses bed at: [facility] Patient to be transferred to facility by: [place] Name of family member notified: Nigel Sloop Patient and family notified of of transfer: 04/24/18  Discharge Plan and Services   Post Acute Care Choice: [place]                    Social Determinants of Health (SDOH) Interventions     Readmission Risk Interventions No flowsheet data found.

## 2018-04-24 NOTE — Progress Notes (Signed)
[facility] for Warfarin  Indication: atrial fibrillation  Allergies  Allergen Reactions  . Lipitor [Atorvastatin] Other (See Comments)    Unknown reaction  . Nitroglycerin Other (See Comments)    DROPS BLOOD PRESSURE DRASTICALLY    Vital Signs: Temp: 97.6 F (36.4 C) (03/13 0400) Temp Source: Oral (03/13 0400) BP: 116/76 (03/13 0400) Pulse Rate: 62 (03/13 0400)  Labs: Recent Labs    04/22/18 0440 04/23/18 0616 04/24/18 0417  HGB 10.7* 10.7*  --   HCT 35.9* 35.8*  --   PLT 196 186  --   LABPROT 16.0* 18.0* 20.1*  INR 1.3* 1.5* 1.7*  CREATININE 1.85* 1.86*  --     Estimated Creatinine Clearance: 29.8 mL/min (A) (by C-G formula based on SCr of 1.86 mg/dL (H)).  Assessment: 85 y/o M from nursing facility with weakness, cough, and LE edema. Afib on Coumadin 6mg  daily exc for 7.5mg  on MF PTA. INR on admission = 3.1. INR trending up to 1.7. Hgb 10.7, plts wnl.  Goal of Therapy:  INR 2-3 Monitor platelets by anticoagulation protocol: Yes   Plan:  Give Coumadin 7.5mg  PO x 1 Monitor daily INR, CBC, s/s of bleed  Elenor Quinones, PharmD, BCPS, [facility] Clinical Pharmacist Phone number [phone] 04/24/2018 8:26 AM

## 2018-04-24 NOTE — Discharge Instructions (Signed)

## 2018-04-24 NOTE — Discharge Summary (Signed)
Physician Discharge Summary  Kenneth Bell TKZ:[id number] DOB: Sep 25, 1926 DOA: 04/19/2018  PCP: [name], DO  Admit date: 04/19/2018 Discharge date: 04/24/2018  Time spent: 40 minutes  Recommendations for Outpatient Follow-up:  1. Follow-up with primary care physician in 1 week 2.   Discharge to [facility] place  Discharge Diagnoses:  Principal Problem:   Acute respiratory failure with hypoxia ([place]) Active Problems:   Persistent atrial fibrillation ([place]); CHA2DS2Vasc = ~4 (CHF, MI/aortic plaque, Age 64)   CKD (chronic kidney disease), stage III (HCC)   Ischemic cardiomyopathy   Biventricular ICD (implantable cardioverter-defibrillator) in place   Essential hypertension   Acute on chronic systolic heart failure (HCC)   Acute CHF (congestive heart failure) ([place])   Influenza A   Discharge Condition: Stable  Diet recommendation: Cardiac  Filed Weights   04/22/18 0059 04/23/18 0500 04/24/18 0400  Weight: 87.2 kg 86.5 kg 86.9 kg    History of present illness:  Kenneth Bell a 85 y.o.malewithhistory of CAD status post CABG, complete heart block status post pacemaker, A. fib, hypertension, chronic kidney disease, aortic stenosis, hypothyroidism was brought to the ER after patient was getting increasingly short of breath peak and slid onto the floor from his wheelchair at the nursing facility. Patient also has benign productive cough. Patient is a poor historian not able to contribute much to the history when I asked. He states he has been feeling weak over the last few days. Most of the history was obtained from the ER physician and previous records.  ED Course:In the ER patient was hypoxic appears erythematous. Chest x-ray shows congestion. Patient was positive for flu started on Tamiflu. Initially was given fluid bolus. Lactate was elevated. BNP was 1468. On exam patient has significant lower extremity edema.  Hospital Course:  ##Acute respiratory failure  with hypoxia -Likely combination of CHF and influenza. Patient was treated with IV Lasix, with good improvement .  Continue with oral Lasix. -Last 2D echo done in December 2019 showed EF of 35 to 40%.  -Currently off of oxygen -Patient also has mild bronchitis, will continue with doxycycline, prednisone for 5 days  ##Congestive heart failure systolic acute on chronic -Echocardiogram done from December 2019 showed EF of 30 to 35% -Also secondary to aortic stenosis critical with valve area of 1.1 cm -Hold IV Lasix considering patient's low normal blood pressure, elevated creatinine -Improved shortness of breath from the time of admission.  Continue with p.o. Lasix  ##Influenza infection - on Tamiflu dosed per pharmacy for flu. -SNF today, completed Tamiflu for total of 5 days.  ##Coagulopathy due to Coumadin -INR 10 -Given 5 mg of IV -INR normalized to 1.4 -Start the patient on Coumadin at lower dose -Follow-up with PT/INR-1.5 today.  Closely follow-up until INR is between 2-3.  ##Persistent A. fib with history of complete heart block status post pacemaker   -Rate is controlled with the metoprolol -On Coumadin 4 mg -Follow-up with PT/INR  ##Right shoulder pain -Old trauma involving the lateral right clavicle with remodeling, superior migration of the right humeral head felt to be indicative of a degree of chronic rotator cuff tear. -Patient was experiencing severe pain at admission -Good improvement on Norco, muscle relaxant at bedtime  ##Severe debility -Involved physical therapy -Recommended SNF  ##CAD S/P CABG -COntinue metoprolol,  statins.Marland Kitchen  ##Hypothyroidism -Continue on Synthroid.  ##Hyperlipidemia  -on statins.  ## aortic stenosis  -felt to be a not a candidate for TAVR,  due to comorbidities. -Patient has a valve area  of 1.1 cm  ##Anemia follow CBC.  ##Acute on chronic kidney disease stage III  -Cardiorenal syndrome -Continues to have  improvement with the diuresis   Discharge Exam: Vitals:   04/23/18 1930 04/24/18 0400  BP: 129/78 116/76  Pulse: (!) 59 62  Resp: 20 20  Temp: (!) 97.5 F (36.4 C) 97.6 F (36.4 C)  SpO2: 98% 93%    General exam: Appears calm and comfortable  Respiratory system: Clear to auscultation. Respiratory effort normal. Cardiovascular system: S1 & S2 heard, RRR. No JVD, murmurs, rubs, gallops or clicks. No pedal edema. Gastrointestinal system: Abdomen is nondistended, soft and nontender. No organomegaly or masses felt. Normal bowel sounds heard. Central nervous system: Alert and oriented. No focal neurological deficits. Extremities: Symmetric 5 x 5 power improved lower extremity edema. Skin: No rashes, lesions or ulcers Psychiatry: Judgement and insight appear normal. Mood & affect appropriate.  Discharge Instructions   Discharge Instructions    Diet - low sodium heart healthy   Complete by:  As directed    Increase activity slowly   Complete by:  As directed      Allergies as of 04/24/2018      Reactions   Lipitor [atorvastatin] Other (See Comments)   Unknown reaction   Nitroglycerin Other (See Comments)   DROPS BLOOD PRESSURE DRASTICALLY      Medication List    STOP taking these medications   metolazone 2.5 MG tablet Commonly known as:  ZAROXOLYN   metoprolol succinate 25 MG 24 hr tablet Commonly known as:  TOPROL-XL   potassium chloride 10 MEQ tablet Commonly known as:  K-DUR   tiZANidine 4 MG capsule Commonly known as:  Zanaflex Replaced by:  tiZANidine 4 MG tablet     TAKE these medications   aspirin 81 MG chewable tablet Chew 81 mg by mouth daily.   bumetanide 2 MG tablet Commonly known as:  BUMEX Take 2 mg by mouth See admin instructions. Take one tablet (2 mg) by mouth daily for fluid retention, may have extra dose (2 mg) if weight gain is greater than 8 lbs.   carvedilol 12.5 MG tablet Commonly known as:  COREG Take 1 tablet (12.5 mg total) by mouth  2 (two) times daily with a meal.   cyanocobalamin 1000 MCG tablet Take 1 tablet (1,000 mcg total) by mouth daily.   doxycycline 50 MG capsule Commonly known as:  VIBRAMYCIN Take 1 capsule (50 mg total) by mouth 2 (two) times daily for 5 days.   HYDROcodone-acetaminophen 5-325 MG tablet Commonly known as:  NORCO/VICODIN Take 1 tablet by mouth every 8 (eight) hours as needed for severe pain.   levETIRAcetam 500 MG tablet Commonly known as:  KEPPRA TAKE 1 TABLET BY MOUTH TWICE DAILY   levothyroxine 50 MCG tablet Commonly known as:  SYNTHROID, LEVOTHROID TAKE 1 TABLET BY MOUTH ONCE DAILY What changed:  when to take this   omeprazole 20 MG capsule Commonly known as:  PRILOSEC TAKE 1 CAPSULE BY MOUTH ONCE DAILY What changed:  when to take this   predniSONE 20 MG tablet Commonly known as:  DELTASONE TAKE 1 TABLET BY MOUTH TWICE DAILY WITH MEALS FOR 5 DAYS What changed:  See the new instructions.   simvastatin 20 MG tablet Commonly known as:  ZOCOR TAKE 1 TABLET BY MOUTH ONCE DAILY IN THE EVENING What changed:  when to take this   tiZANidine 4 MG tablet Commonly known as:  ZANAFLEX Take 1 tablet (4 mg total) by mouth at  bedtime. Replaces:  tiZANidine 4 MG capsule   warfarin 6 MG tablet Commonly known as:  COUMADIN Take as directed. If you are unsure how to take this medication, talk to your nurse or doctor. Original instructions:  Take 6 mg by mouth See admin instructions. Take one tablet (6 mg) by mouth on Sunday, Tuesday, Wednesday, Thursday, Saturday evenings What changed:  Another medication with the same name was removed. Continue taking this medication, and follow the directions you see here.      Allergies  Allergen Reactions  . Lipitor [Atorvastatin] Other (See Comments)    Unknown reaction  . Nitroglycerin Other (See Comments)    DROPS BLOOD PRESSURE DRASTICALLY    Contact information for after-discharge care    Destination    [facility] Preferred  SNF .   Service:  Skilled Nursing Contact information: [place] [phone]               The results of significant diagnostics from this hospitalization (including imaging, microbiology, ancillary and laboratory) are listed below for reference.    Significant Diagnostic Studies: Dg Chest 2 View  Result Date: 04/19/2018 CLINICAL DATA:  Weakness, cough, and lower extremity edema for 4 days. EXAM: CHEST - 2 VIEW COMPARISON:  04/10/2018 FINDINGS: Cardiac pacemaker. Postoperative changes in the mediastinum. Cardiac enlargement. Mild pulmonary vascular congestion. Suggestion of mild interstitial changes in the lung bases probably early interstitial edema. Probable small left pleural effusion, new since prior study. No pneumothorax or focal consolidation. Calcification of the aorta. Old resection or resorption of the distal clavicles. IMPRESSION: Cardiac enlargement with mild pulmonary vascular congestion and interstitial edema. Probable small left pleural effusion. Electronically Signed   By: Lucienne Capers M.D.   On: 04/19/2018 20:07   Dg Chest 2 View  Result Date: 04/10/2018 CLINICAL DATA:  Fall. EXAM: CHEST - 2 VIEW COMPARISON:  03/18/2018. 03/01/2018. 01/23/2018. 02/11/2017. 08/26/2016. FINDINGS: Prior CABG. AICD in stable position. Stable cardiomegaly. Pulmonary venous congestion. Mild bilateral interstitial prominence noted. Mild component of CHF can not be completely excluded. Underlying chronic interstitial lung disease most likely present. No pleural effusion or pneumothorax. No acute bony abnormality identified. 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 IMPRESSION: 1. Prior CABG. AICD in stable position. Stable cardiomegaly mild pulmonary venous congestion. 2. Mild bilateral interstitial prominence again noted. A component of mild CHF can not be completely excluded. Underlying chronic interstitial lung disease most  likely present. Electronically Signed   By: Marcello Moores  Register   On: 04/10/2018 13:32   Dg Cervical Spine Complete  Result Date: 03/30/2018 CLINICAL DATA:  Cervicalgia EXAM: CERVICAL SPINE - COMPLETE 4+ VIEW COMPARISON:  None. FINDINGS: Frontal, lateral, open-mouth odontoid, and bilateral oblique views were obtained. There is osteoporosis. There is no fracture or spondylolisthesis. Prevertebral soft tissues and predental space regions are normal. There is severe disc space narrowing at all levels except for C2-3. There is facet hypertrophy with exit foraminal narrowing bilaterally at C3-4, C4-5, and C6-7. There is also exit foraminal narrowing on the right at C5-6. No erosive changes. Lung apices are clear. IMPRESSION: Diffuse osteoporosis. Multilevel arthropathy. No fracture or spondylolisthesis evident. Electronically Signed   By: Lowella Grip III M.D.   On: 03/30/2018 17:33   Dg Pelvis 1-2 Views  Result Date: 04/11/2018 CLINICAL DATA:  Patient fell today. EXAM: PELVIS - 1-2 VIEW COMPARISON:  None. FINDINGS: Diffuse bone demineralization. No evidence of acute fracture or dislocation. Degenerative changes in the hips. Sacrum  is mostly obscured by bowel gas. Vascular calcifications. Calcified phleboliths. IMPRESSION: No acute bony abnormalities. Electronically Signed   By: Lucienne Capers M.D.   On: 04/11/2018 00:26   Dg Shoulder Right  Result Date: 04/20/2018 CLINICAL DATA:  Chronic pain EXAM: RIGHT SHOULDER - 2+ VIEW COMPARISON:  April 10, 2018 FINDINGS: Oblique and Y scapular images were obtained. There is no acute fracture or dislocation. There is again noted superior migration of the right humeral head. There is evidence of old trauma involving the lateral right clavicle with remodeling. No erosive change. Visualized right lung clear. IMPRESSION: Old trauma involving the lateral right clavicle with remodeling. No acute fracture or dislocation evident. Superior migration of the right humeral  head is felt to be indicative of a degree of chronic rotator cuff tear. No erosive change. Appearance similar to recent prior study. Electronically Signed   By: Lowella Grip III M.D.   On: 04/20/2018 07:57   Dg Shoulder Right  Result Date: 04/11/2018 CLINICAL DATA:  85 year old male with fall and right shoulder pain. EXAM: RIGHT SHOULDER - 2+ VIEW COMPARISON:  Right shoulder radiograph dated 04/10/2018 FINDINGS: There is no acute fracture or dislocation. The bones are osteopenic. Mild arthritic changes. There is elevated appearance of the right humeral head suggestive of chronic rotator cuff injury. The soft tissues are unremarkable. IMPRESSION: No acute fracture or dislocation. Electronically Signed   By: Anner Crete M.D.   On: 04/11/2018 00:08   Dg Shoulder Right  Result Date: 04/10/2018 CLINICAL DATA:  Fall. Pt stated that he fell yesterday. Pt said he slipped fell forward and slid down the wall. Pt stated that he has pain in his entire right shoulder, and also mentioned that he has arthritis in his right shoulder. Pt denied any cp. EXAM: RIGHT SHOULDER - 2+ VIEW COMPARISON:  03/30/2018 FINDINGS: No fracture or bone lesion. Mild narrowing of the inferior glenohumeral joint space. There is widening of the [facility] joint, which may be postsurgical. Patient has a history of shoulder surgery. Skeletal structures are demineralized. Surrounding soft tissues are unremarkable. No change from the prior exam. IMPRESSION: 1. No fracture or dislocation. 2. Widened AC joints likely postsurgical in origin, stable from the prior study. Electronically Signed   By: Lajean Manes M.D.   On: 04/10/2018 13:32   Dg Shoulder Right  Result Date: 03/30/2018 CLINICAL DATA:  Pain EXAM: RIGHT SHOULDER - 2+ VIEW COMPARISON:  None. FINDINGS: Frontal, oblique, and Y scapular images were obtained. The bones are osteoporotic. No fracture or dislocation. There is mild narrowing of the glenohumeral joint. The acromioclavicular  joint appears slightly widened. No erosive changes. Visualized right lung is clear. IMPRESSION: Bones osteoporotic. Question slight acromioclavicular separation. Coracoclavicular joint does not appear widened. Narrowing glenohumeral joint. No fracture or dislocation evident. Electronically Signed   By: Lowella Grip III M.D.   On: 03/30/2018 17:35   Ct Head Wo Contrast  Result Date: 04/19/2018 CLINICAL DATA:  Acute loss of consciousness. Patient fell this morning. EXAM: CT HEAD WITHOUT CONTRAST TECHNIQUE: Contiguous axial images were obtained from the base of the skull through the vertex without intravenous contrast. COMPARISON:  None. FINDINGS: Brain: Redemonstration of cerebral atrophy with sulcal and ventricular prominence. Chronic moderate small vessel ischemic disease of periventricular and deep white matter. No large vascular territory infarction. No intra-axial mass nor extra-axial fluid. Vascular: Stable right MCA bifurcation partially calcified aneurysm measuring 12.5 x 10.7 mm, series 3/17. No hyperdense vessel sign. Moderate atherosclerosis of carotid siphons bilaterally. Skull: No  acute skull fracture or suspicious osseous lesions. Mild osteopenia at the skull base. Sinuses/Orbits: Intact orbits and globes with bilateral cataract extractions. Mild-to-moderate paranasal sinus mucosal thickening, greatest in the sphenoid sinus with wall thickening of the sphenoid likely representing stigmata of chronic sinusitis. Small mucous retention cysts or polyps are noted within the maxillary sinuses bilaterally. Other: No significant calvarial soft tissue swelling. IMPRESSION: 1. No acute intracranial abnormality. Age related atrophy with moderate small vessel ischemia. 2. Stable right MCA bifurcation aneurysm estimated at 12.5 x 10.7 mm currently, series 3/17. 3. Paranasal sinus mucosal thickening. Electronically Signed   By: Ashley Royalty M.D.   On: 04/19/2018 19:38   Ct Head Wo Contrast  Result Date:  04/11/2018 CLINICAL DATA:  Initial evaluation for acute trauma, fall. EXAM: CT HEAD WITHOUT CONTRAST TECHNIQUE: Contiguous axial images were obtained from the base of the skull through the vertex without intravenous contrast. COMPARISON:  Prior CT from 04/02/2018. FINDINGS: Brain: Atrophy with chronic microvascular ischemic disease, stable. Small chronic left cerebellar infarct again noted. No acute intracranial hemorrhage. No acute large vessel territory infarct. No mass lesion, midline shift or mass effect. No hydrocephalus. No extra-axial fluid collection. Vascular: No asymmetric hyperdense vessel. Extensive atherosclerotic change at the skull base. Aneurysm at the right MCA bifurcation measuring up to 15 mm again noted, stable. Skull: Scalp soft tissues and calvarium within normal limits. Sinuses/Orbits: Globes and orbital soft tissues normal. Chronic mucosal thickening throughout the paranasal sinuses. No air-fluid levels to suggest acute sinusitis. Mastoid air cells remain clear. Other: None. IMPRESSION: 1. No acute intracranial abnormality. 2. Stable head CT with advanced age-related cerebral atrophy with chronic microvascular ischemic disease. 3. 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 Electronically Signed   By: Jeannine Boga M.D.   On: 04/11/2018 00:46   Ct Head Wo Contrast  Result Date: 04/10/2018 CLINICAL DATA:  85 y/o M; 2 unwitnessed falls and altered mental status. EXAM: CT HEAD WITHOUT CONTRAST CT CERVICAL SPINE WITHOUT CONTRAST TECHNIQUE: Multidetector CT imaging of the head and cervical spine was performed following the standard protocol without intravenous contrast. Multiplanar CT image reconstructions of the cervical spine were also generated. COMPARISON:  02/20/2017 CT head. 02/21/2017 CT angiogram head. FINDINGS: CT HEAD FINDINGS Brain: No evidence of acute infarction, hemorrhage, hydrocephalus, extra-axial collection or mass lesion/mass effect. Stable nonspecific white matter  hypodensities compatible with chronic microvascular ischemic changes and stable volume loss of the brain. Very small chronic infarction within the left cerebellar hemisphere is stable. Vascular: Calcific atherosclerosis of carotid siphons and the vertebral arteries. No hyperdense vessel identified. MCA bifurcation aneurysm measures 9.4 mm in the axial plane (series 4, image 17) which is stable in comparison with the prior CT of head. The aneurysm is better characterized on the prior CT angiogram of the head. Skull: Normal. Negative for fracture or focal lesion. Sinuses/Orbits: Multiple mucous retention cysts within the maxillary and sphenoid sinuses. Mild paranasal sinus mucosal thickening. Normal aeration of mastoid air cells. Bilateral intra-ocular lens replacement. Other: None. CT CERVICAL SPINE FINDINGS Alignment: Straightening of cervical lordosis. Minimal C4-5, T1-2, and T2-3 grade 1 anterolisthesis. Skull base and vertebrae: No acute fracture. No primary bone lesion or focal pathologic process. Soft tissues and spinal canal: No prevertebral fluid or swelling. No visible canal hematoma. Disc levels: Advanced cervical spondylosis with multilevel disc and facet degenerative changes. There is right-sided C3-4 erosive facet arthritis. Facet fusion of upper thoracic spine. Uncovertebral and facet hypertrophy results in high-grade bony neural foraminal stenosis at the left C3-4,  bilateral C4-5, bilateral C5-6, bilateral C6-7 levels. No high-grade bony spinal canal stenosis. Upper chest: Part solid opacity in the right upper lobe is grossly stable measuring 20 x 20 mm (AP by ML series 9, image 86). Other: Aberrant right subclavian artery. IMPRESSION: CT head: 1. No acute intracranial abnormality or calvarial fracture identified. 2. Stable chronic microvascular ischemic changes and parenchymal volume loss of the brain. 3. 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 4. Stable left MCA bifurcation  aneurysm in comparison with prior CT of head, better characterized on prior CTA of head. CT cervical spine: 1. No acute fracture or dislocation identified. 2. Advanced cervical spondylosis with multilevel bony neural foraminal stenosis. No high-grade bony spinal canal stenosis. 3. Stable part solid opacity in the right upper lobe. Follow-up as per prior CT of chest recommendations. Electronically Signed   By: Kristine Garbe M.D.   On: 04/10/2018 16:05   Ct Cervical Spine Wo Contrast  Result Date: 04/10/2018 CLINICAL DATA:  85 y/o M; 2 unwitnessed falls and altered mental status. EXAM: CT HEAD WITHOUT CONTRAST CT CERVICAL SPINE WITHOUT CONTRAST TECHNIQUE: Multidetector CT imaging of the head and cervical spine was performed following the standard protocol without intravenous contrast. Multiplanar CT image reconstructions of the cervical spine were also generated. COMPARISON:  02/20/2017 CT head. 02/21/2017 CT angiogram head. FINDINGS: CT HEAD FINDINGS Brain: No evidence of acute infarction, hemorrhage, hydrocephalus, extra-axial collection or mass lesion/mass effect. Stable nonspecific white matter hypodensities compatible with chronic microvascular ischemic changes and stable volume loss of the brain. Very small chronic infarction within the left cerebellar hemisphere is stable. Vascular: Calcific atherosclerosis of carotid siphons and the vertebral arteries. No hyperdense vessel identified. MCA bifurcation aneurysm measures 9.4 mm in the axial plane (series 4, image 17) which is stable in comparison with the prior CT of head. The aneurysm is better characterized on the prior CT angiogram of the head. Skull: Normal. Negative for fracture or focal lesion. Sinuses/Orbits: Multiple mucous retention cysts within the maxillary and sphenoid sinuses. Mild paranasal sinus mucosal thickening. Normal aeration of mastoid air cells. Bilateral intra-ocular lens replacement. Other: None. CT CERVICAL SPINE FINDINGS  Alignment: Straightening of cervical lordosis. Minimal C4-5, T1-2, and T2-3 grade 1 anterolisthesis. Skull base and vertebrae: No acute fracture. No primary bone lesion or focal pathologic process. Soft tissues and spinal canal: No prevertebral fluid or swelling. No visible canal hematoma. Disc levels: Advanced cervical spondylosis with multilevel disc and facet degenerative changes. There is right-sided C3-4 erosive facet arthritis. Facet fusion of upper thoracic spine. Uncovertebral and facet hypertrophy results in high-grade bony neural foraminal stenosis at the left C3-4, bilateral C4-5, bilateral C5-6, bilateral C6-7 levels. No high-grade bony spinal canal stenosis. Upper chest: Part solid opacity in the right upper lobe is grossly stable measuring 20 x 20 mm (AP by ML series 9, image 86). Other: Aberrant right subclavian artery. IMPRESSION: CT head: 1. No acute intracranial abnormality or calvarial fracture identified. 2. Stable chronic microvascular ischemic changes and parenchymal volume loss of the brain. 3. 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 4. Stable left MCA bifurcation aneurysm in comparison with prior CT of head, better characterized on prior CTA of head. CT cervical spine: 1. No acute fracture or dislocation identified. 2. Advanced cervical spondylosis with multilevel bony neural foraminal stenosis. No high-grade bony spinal canal stenosis. 3. Stable part solid opacity in the right upper lobe. Follow-up as per prior CT of chest recommendations. Electronically Signed   By: Kristine Garbe  M.D.   On: 04/10/2018 16:05    Microbiology: Recent Results (from the past 240 hour(s))  Culture, blood (Routine x 2)     Status: None (Preliminary result)   Collection Time: 04/19/18  5:18 PM  Result Value Ref Range Status   Specimen Description BLOOD RIGHT ANTECUBITAL  Final   Special Requests   Final    BOTTLES DRAWN AEROBIC AND ANAEROBIC Blood Culture results may not  be optimal due to an inadequate volume of blood received in culture bottles   Culture   Final    NO GROWTH 4 DAYS Performed at [facility], [place] [address], [place], [place] [address]    Report Status PENDING  Incomplete  MRSA PCR Screening     Status: None   Collection Time: 04/20/18  2:36 PM  Result Value Ref Range Status   MRSA by PCR NEGATIVE NEGATIVE Final    Comment:        The GeneXpert MRSA Assay (FDA approved for NASAL specimens only), is one component of a comprehensive MRSA colonization surveillance program. It is not intended to diagnose MRSA infection nor to guide or monitor treatment for MRSA infections. Performed at [facility], [place] [address], [place], [place] [address]      Labs: Basic Metabolic Panel: Recent Labs  Lab 04/19/18 1753 04/20/18 0131 04/21/18 0448 04/22/18 0440 04/23/18 0616  NA 135 138 134* 137 136  K 4.3 4.7 3.8 4.0 3.9  CL 104 105 103 104 105  CO2 20* 19* 22 25 23   GLUCOSE 132* 112* 115* 106* 112*  BUN 52* 51* 50* 47* 45*  CREATININE 2.06* 1.96* 1.75* 1.85* 1.86*  CALCIUM 9.9 10.3 9.4 9.3 9.2   Liver Function Tests: Recent Labs  Lab 04/19/18 1753 04/20/18 0131  AST 37 37  ALT 25 26  ALKPHOS 63 69  BILITOT 1.3* 1.6*  PROT 6.7 6.9  ALBUMIN 3.2* 3.2*   Recent Labs  Lab 04/19/18 1753  LIPASE 15   No results for input(s): AMMONIA in the last 168 hours. CBC: Recent Labs  Lab 04/19/18 1753 04/20/18 0131 04/21/18 0448 04/22/18 0440 04/23/18 0616  WBC 5.3 5.0 4.5 3.8* 4.1  NEUTROABS 4.5 3.5  --   --   --   HGB 11.2* 12.0* 10.6* 10.7* 10.7*  HCT 37.6* 40.6 33.9* 35.9* 35.8*  MCV 97.9 98.3 95.0 95.5 96.8  PLT 179 188 177 196 186   Cardiac Enzymes: Recent Labs  Lab 04/20/18 0131 04/20/18 0757 04/20/18 1443  TROPONINI 0.26* 0.24* 0.26*   BNP: BNP (last 3 results) Recent Labs    01/23/18 0458 03/01/18 2139 04/19/18 1753  BNP 869.8* 649.8* 1,468.4*    ProBNP (last 3 results) No results for  input(s): PROBNP in the last 8760 hours.  CBG: No results for input(s): GLUCAP in the last 168 hours.     SignedMonica Becton MD.  [facility] 04/24/2018, 1:02 PM

## 2018-04-24 NOTE — Telephone Encounter (Signed)
Received Physician Orders from [facility], pt currently in hospital admit status; forwarded to provider/SLS 03/13

## 2018-04-27 DIAGNOSIS — J111 Influenza due to unidentified influenza virus with other respiratory manifestations: Secondary | ICD-10-CM | POA: Diagnosis not present

## 2018-04-27 DIAGNOSIS — I5021 Acute systolic (congestive) heart failure: Secondary | ICD-10-CM | POA: Diagnosis not present

## 2018-04-27 DIAGNOSIS — J208 Acute bronchitis due to other specified organisms: Secondary | ICD-10-CM | POA: Diagnosis not present

## 2018-04-27 DIAGNOSIS — N178 Other acute kidney failure: Secondary | ICD-10-CM | POA: Diagnosis not present

## 2018-04-27 DIAGNOSIS — D6489 Other specified anemias: Secondary | ICD-10-CM | POA: Diagnosis not present

## 2018-04-27 DIAGNOSIS — I671 Cerebral aneurysm, nonruptured: Secondary | ICD-10-CM | POA: Diagnosis not present

## 2018-04-27 DIAGNOSIS — I480 Paroxysmal atrial fibrillation: Secondary | ICD-10-CM | POA: Diagnosis not present

## 2018-04-27 DIAGNOSIS — I442 Atrioventricular block, complete: Secondary | ICD-10-CM | POA: Diagnosis not present

## 2018-04-29 DIAGNOSIS — I5021 Acute systolic (congestive) heart failure: Secondary | ICD-10-CM | POA: Diagnosis not present

## 2018-04-29 DIAGNOSIS — Z5181 Encounter for therapeutic drug level monitoring: Secondary | ICD-10-CM | POA: Diagnosis not present

## 2018-04-29 DIAGNOSIS — J208 Acute bronchitis due to other specified organisms: Secondary | ICD-10-CM | POA: Diagnosis not present

## 2018-04-29 DIAGNOSIS — Z7901 Long term (current) use of anticoagulants: Secondary | ICD-10-CM | POA: Diagnosis not present

## 2018-05-06 DIAGNOSIS — M25511 Pain in right shoulder: Secondary | ICD-10-CM | POA: Diagnosis not present

## 2018-05-07 DIAGNOSIS — I5021 Acute systolic (congestive) heart failure: Secondary | ICD-10-CM | POA: Diagnosis not present

## 2018-05-07 DIAGNOSIS — S46001A Unspecified injury of muscle(s) and tendon(s) of the rotator cuff of right shoulder, initial encounter: Secondary | ICD-10-CM | POA: Diagnosis not present

## 2018-05-11 DIAGNOSIS — R0902 Hypoxemia: Secondary | ICD-10-CM | POA: Diagnosis not present

## 2018-05-11 DIAGNOSIS — R600 Localized edema: Secondary | ICD-10-CM | POA: Diagnosis not present

## 2018-05-11 DIAGNOSIS — I480 Paroxysmal atrial fibrillation: Secondary | ICD-10-CM | POA: Diagnosis not present

## 2018-05-12 DIAGNOSIS — R600 Localized edema: Secondary | ICD-10-CM | POA: Diagnosis not present

## 2018-05-12 DIAGNOSIS — M12811 Other specific arthropathies, not elsewhere classified, right shoulder: Secondary | ICD-10-CM | POA: Diagnosis not present

## 2018-05-12 DIAGNOSIS — R0902 Hypoxemia: Secondary | ICD-10-CM | POA: Diagnosis not present

## 2018-05-12 DIAGNOSIS — R4189 Other symptoms and signs involving cognitive functions and awareness: Secondary | ICD-10-CM | POA: Diagnosis not present

## 2018-05-12 DIAGNOSIS — I5021 Acute systolic (congestive) heart failure: Secondary | ICD-10-CM | POA: Diagnosis not present

## 2018-05-12 DIAGNOSIS — I480 Paroxysmal atrial fibrillation: Secondary | ICD-10-CM | POA: Diagnosis not present

## 2018-05-13 DIAGNOSIS — I5021 Acute systolic (congestive) heart failure: Secondary | ICD-10-CM | POA: Diagnosis not present

## 2018-05-13 DIAGNOSIS — J188 Other pneumonia, unspecified organism: Secondary | ICD-10-CM | POA: Diagnosis not present

## 2018-05-13 DIAGNOSIS — M12811 Other specific arthropathies, not elsewhere classified, right shoulder: Secondary | ICD-10-CM | POA: Diagnosis not present

## 2018-05-13 DIAGNOSIS — R600 Localized edema: Secondary | ICD-10-CM | POA: Diagnosis not present

## 2018-05-14 ENCOUNTER — Telehealth: Payer: Self-pay | Admitting: *Deleted

## 2018-05-14 DIAGNOSIS — R05 Cough: Secondary | ICD-10-CM | POA: Diagnosis not present

## 2018-05-14 DIAGNOSIS — I5021 Acute systolic (congestive) heart failure: Secondary | ICD-10-CM | POA: Diagnosis not present

## 2018-05-14 DIAGNOSIS — J188 Other pneumonia, unspecified organism: Secondary | ICD-10-CM | POA: Diagnosis not present

## 2018-05-14 NOTE — Telephone Encounter (Signed)
LEFT MESSAGE TO CALL BACK TO POSTPONE APPT FOR 4/6 W/DR HARDING   SPOKE TO DAUGHTER HELEN SUITS SHE IS AWARE AND STATES PATIENT IS IN [place] NOW     Primary Cardiologist:  Glenetta Hew, MD   Patient's daughter Bonnita Nasuti contacted.  History reviewed.  No symptoms to suggest any unstable cardiac conditions.  Based on discussion, with current pandemic situation, we will be postponing this appointment for Kenneth Bell with a plan for f/u in >12wks or sooner if feasible/necessary.  If symptoms change, he has been instructed to contact our office.   Routing to [facility]19 CANCEL pool for tracking    Ines Bloomer  05/14/2018 12:55 PM         .

## 2018-05-18 ENCOUNTER — Ambulatory Visit: Payer: Medicare Other | Admitting: Cardiology

## 2018-05-18 DIAGNOSIS — J188 Other pneumonia, unspecified organism: Secondary | ICD-10-CM | POA: Diagnosis not present

## 2018-05-18 DIAGNOSIS — R0902 Hypoxemia: Secondary | ICD-10-CM | POA: Diagnosis not present

## 2018-05-18 DIAGNOSIS — I5021 Acute systolic (congestive) heart failure: Secondary | ICD-10-CM | POA: Diagnosis not present

## 2018-05-19 ENCOUNTER — Encounter: Payer: Medicare Other | Admitting: *Deleted

## 2018-05-19 ENCOUNTER — Other Ambulatory Visit: Payer: Self-pay

## 2018-05-19 ENCOUNTER — Telehealth: Payer: Self-pay

## 2018-05-19 DIAGNOSIS — E038 Other specified hypothyroidism: Secondary | ICD-10-CM | POA: Diagnosis not present

## 2018-05-19 DIAGNOSIS — R4189 Other symptoms and signs involving cognitive functions and awareness: Secondary | ICD-10-CM | POA: Diagnosis not present

## 2018-05-19 DIAGNOSIS — G4089 Other seizures: Secondary | ICD-10-CM | POA: Diagnosis not present

## 2018-05-19 DIAGNOSIS — Z7901 Long term (current) use of anticoagulants: Secondary | ICD-10-CM | POA: Diagnosis not present

## 2018-05-19 DIAGNOSIS — R600 Localized edema: Secondary | ICD-10-CM | POA: Diagnosis not present

## 2018-05-19 DIAGNOSIS — I480 Paroxysmal atrial fibrillation: Secondary | ICD-10-CM | POA: Diagnosis not present

## 2018-05-19 DIAGNOSIS — R0902 Hypoxemia: Secondary | ICD-10-CM | POA: Diagnosis not present

## 2018-05-19 DIAGNOSIS — I442 Atrioventricular block, complete: Secondary | ICD-10-CM | POA: Diagnosis not present

## 2018-05-19 DIAGNOSIS — J188 Other pneumonia, unspecified organism: Secondary | ICD-10-CM | POA: Diagnosis not present

## 2018-05-19 NOTE — Telephone Encounter (Signed)
Left message for patient to remind of missed remote transmission.  

## 2018-05-20 DIAGNOSIS — R4189 Other symptoms and signs involving cognitive functions and awareness: Secondary | ICD-10-CM | POA: Diagnosis not present

## 2018-05-20 DIAGNOSIS — R600 Localized edema: Secondary | ICD-10-CM | POA: Diagnosis not present

## 2018-05-20 DIAGNOSIS — I5021 Acute systolic (congestive) heart failure: Secondary | ICD-10-CM | POA: Diagnosis not present

## 2018-05-20 DIAGNOSIS — R0902 Hypoxemia: Secondary | ICD-10-CM | POA: Diagnosis not present

## 2018-05-21 DIAGNOSIS — J188 Other pneumonia, unspecified organism: Secondary | ICD-10-CM | POA: Diagnosis not present

## 2018-05-21 DIAGNOSIS — Z7189 Other specified counseling: Secondary | ICD-10-CM | POA: Diagnosis not present

## 2018-05-21 DIAGNOSIS — N178 Other acute kidney failure: Secondary | ICD-10-CM | POA: Diagnosis not present

## 2018-05-21 DIAGNOSIS — I5021 Acute systolic (congestive) heart failure: Secondary | ICD-10-CM | POA: Diagnosis not present

## 2018-05-21 DIAGNOSIS — I480 Paroxysmal atrial fibrillation: Secondary | ICD-10-CM | POA: Diagnosis not present

## 2018-05-26 DIAGNOSIS — Z7189 Other specified counseling: Secondary | ICD-10-CM | POA: Diagnosis not present

## 2018-05-26 DIAGNOSIS — R600 Localized edema: Secondary | ICD-10-CM | POA: Diagnosis not present

## 2018-05-26 DIAGNOSIS — I5021 Acute systolic (congestive) heart failure: Secondary | ICD-10-CM | POA: Diagnosis not present

## 2018-05-28 ENCOUNTER — Encounter: Payer: Self-pay | Admitting: Cardiology

## 2018-05-29 DIAGNOSIS — R600 Localized edema: Secondary | ICD-10-CM | POA: Diagnosis not present

## 2018-05-29 DIAGNOSIS — R05 Cough: Secondary | ICD-10-CM | POA: Diagnosis not present

## 2018-05-29 DIAGNOSIS — R0602 Shortness of breath: Secondary | ICD-10-CM | POA: Diagnosis not present

## 2018-06-08 DIAGNOSIS — R4189 Other symptoms and signs involving cognitive functions and awareness: Secondary | ICD-10-CM | POA: Diagnosis not present

## 2018-06-08 DIAGNOSIS — R0902 Hypoxemia: Secondary | ICD-10-CM | POA: Diagnosis not present

## 2018-06-08 DIAGNOSIS — R05 Cough: Secondary | ICD-10-CM | POA: Diagnosis not present

## 2018-06-08 DIAGNOSIS — I5021 Acute systolic (congestive) heart failure: Secondary | ICD-10-CM | POA: Diagnosis not present

## 2018-06-12 DIAGNOSIS — I5021 Acute systolic (congestive) heart failure: Secondary | ICD-10-CM | POA: Diagnosis not present

## 2018-06-12 DIAGNOSIS — I480 Paroxysmal atrial fibrillation: Secondary | ICD-10-CM | POA: Diagnosis not present

## 2018-06-12 DIAGNOSIS — R05 Cough: Secondary | ICD-10-CM | POA: Diagnosis not present

## 2018-06-12 DIAGNOSIS — R0902 Hypoxemia: Secondary | ICD-10-CM | POA: Diagnosis not present

## 2018-06-12 DIAGNOSIS — I442 Atrioventricular block, complete: Secondary | ICD-10-CM | POA: Diagnosis not present

## 2018-06-12 DIAGNOSIS — U071 COVID-19: Secondary | ICD-10-CM | POA: Diagnosis not present

## 2018-06-12 DIAGNOSIS — N178 Other acute kidney failure: Secondary | ICD-10-CM | POA: Diagnosis not present

## 2018-06-12 DIAGNOSIS — R600 Localized edema: Secondary | ICD-10-CM | POA: Diagnosis not present

## 2018-06-16 DIAGNOSIS — I442 Atrioventricular block, complete: Secondary | ICD-10-CM | POA: Diagnosis not present

## 2018-06-16 DIAGNOSIS — I5189 Other ill-defined heart diseases: Secondary | ICD-10-CM | POA: Diagnosis not present

## 2018-06-16 DIAGNOSIS — I480 Paroxysmal atrial fibrillation: Secondary | ICD-10-CM | POA: Diagnosis not present

## 2018-06-16 DIAGNOSIS — N183 Chronic kidney disease, stage 3 (moderate): Secondary | ICD-10-CM | POA: Diagnosis not present

## 2018-06-16 DIAGNOSIS — E038 Other specified hypothyroidism: Secondary | ICD-10-CM | POA: Diagnosis not present

## 2018-06-16 DIAGNOSIS — M12811 Other specific arthropathies, not elsewhere classified, right shoulder: Secondary | ICD-10-CM | POA: Diagnosis not present

## 2018-06-16 DIAGNOSIS — U071 COVID-19: Secondary | ICD-10-CM | POA: Diagnosis not present

## 2018-06-19 ENCOUNTER — Ambulatory Visit: Payer: Medicare Other

## 2018-06-24 ENCOUNTER — Telehealth: Payer: Self-pay | Admitting: Family Medicine

## 2018-06-24 NOTE — Telephone Encounter (Signed)
I would say yes, but would check with Dr. Allison Quarry office (cardiology).

## 2018-06-24 NOTE — Telephone Encounter (Signed)
Copied from [place] [id number]. Topic: General - Inquiry >> Jun 24, 2018  9:29 AM Margot Ables wrote: Reason for CRM: Pt daughter, Vickii Chafe, calling stating pt is now in [facility] under hospice. The hospice nurse is needing information on the pts defibrillator. Peggy said the nurse told her it would shock him for 30 minutes to try to restart his heart. Pt has a DNR and they are needing to know if this can be stopped.   Please contact [facility], Larene Beach is head nurse, ph # [phone]

## 2018-06-24 NOTE — Telephone Encounter (Signed)
Called both number below and direct # [phone] left detailed message of PCP instructions, included cardiologist number. Informed the daughter of message/number as well.

## 2018-08-05 ENCOUNTER — Telehealth: Payer: Self-pay

## 2018-08-05 NOTE — Telephone Encounter (Signed)
Pt daughter is concerned that if her father body dies will his ICD continue to shock him over and over again. She do not want the ICD to bring him back to life. I explained to the daughter that there is 2 components to the heart the electrical and the pumping of the heart. The ICD is part of the electrical part of the heart. Once the heart stops pumping the pt dies. The ICD can not bring the pt back to life. I told her if we get a signed order from the hospice Dr. We can have the [employer] turn the ICD off but will have to reach one that is in [facility]. She do not want it turned off but do not want the ICD to shock the pt.

## 2018-08-05 NOTE — Telephone Encounter (Signed)
Spoke to pt daughter Vickii Chafe, informed her that the pt CRT-D will not continue to shock the pt if he passes. Per pt daughter, the pt is "in hospice and is declining quickly." The family is not ready to turn off the device yet. Peggy verbalizes understanding and has no further questions at this time.

## 2018-08-06 LAB — CUP PACEART REMOTE DEVICE CHECK
Battery Remaining Longevity: 54 mo
Battery Remaining Percentage: 76 %
Brady Statistic RA Percent Paced: 0 %
Brady Statistic RV Percent Paced: 87 %
Date Time Interrogation Session: 20200508163500
HighPow Impedance: 52 Ohm
Implantable Lead Implant Date: 20080918
Implantable Lead Implant Date: 20080918
Implantable Lead Implant Date: 20150918
Implantable Lead Implant Date: 20150918
Implantable Lead Location: 753858
Implantable Lead Location: 753859
Implantable Lead Location: 753860
Implantable Lead Location: 753860
Implantable Lead Model: 181
Implantable Lead Model: 5076
Implantable Lead Model: 5076
Implantable Lead Serial Number: 330602
Implantable Pulse Generator Implant Date: 20150918
Lead Channel Impedance Value: 424 Ohm
Lead Channel Impedance Value: 463 Ohm
Lead Channel Impedance Value: 497 Ohm
Lead Channel Pacing Threshold Amplitude: 0.3 V
Lead Channel Pacing Threshold Amplitude: 0.6 V
Lead Channel Pacing Threshold Pulse Width: 0.5 ms
Lead Channel Pacing Threshold Pulse Width: 0.5 ms
Lead Channel Setting Pacing Amplitude: 2.5 V
Lead Channel Setting Pacing Amplitude: 2.5 V
Lead Channel Setting Pacing Pulse Width: 0.5 ms
Lead Channel Setting Pacing Pulse Width: 0.5 ms
Lead Channel Setting Sensing Sensitivity: 0.6 mV
Lead Channel Setting Sensing Sensitivity: 1 mV
Pulse Gen Serial Number: 103634

## 2018-08-18 ENCOUNTER — Telehealth: Payer: Self-pay | Admitting: Cardiology

## 2018-08-18 NOTE — Telephone Encounter (Signed)
Notes recorded by Sanda Klein, MD on 08/13/2018 at 3:09 PM EDT  I understand that he is now under hospice care. Under those circumstances, I do not think that a change in his meds is required. The focus is obviously on comfort measures, which this med change would not address.   Relayed Dr C message to Daleen Snook she will add to chart

## 2018-08-18 NOTE — Telephone Encounter (Signed)
New message  Per Daleen Snook returning call in reference to adding a medication per Dr. Sallyanne Kuster. Please call.

## 2018-09-10 ENCOUNTER — Other Ambulatory Visit: Payer: Self-pay

## 2018-10-13 DIAGNOSIS — Z9181 History of falling: Secondary | ICD-10-CM | POA: Diagnosis not present

## 2018-10-13 DIAGNOSIS — K219 Gastro-esophageal reflux disease without esophagitis: Secondary | ICD-10-CM | POA: Diagnosis not present

## 2018-10-13 DIAGNOSIS — I5042 Chronic combined systolic (congestive) and diastolic (congestive) heart failure: Secondary | ICD-10-CM | POA: Diagnosis not present

## 2018-10-13 DIAGNOSIS — Z7901 Long term (current) use of anticoagulants: Secondary | ICD-10-CM | POA: Diagnosis not present

## 2018-10-13 DIAGNOSIS — I480 Paroxysmal atrial fibrillation: Secondary | ICD-10-CM | POA: Diagnosis not present

## 2018-10-13 DIAGNOSIS — E039 Hypothyroidism, unspecified: Secondary | ICD-10-CM | POA: Diagnosis not present

## 2018-10-13 DIAGNOSIS — Z66 Do not resuscitate: Secondary | ICD-10-CM | POA: Diagnosis not present

## 2018-10-13 DIAGNOSIS — R1310 Dysphagia, unspecified: Secondary | ICD-10-CM | POA: Diagnosis not present

## 2018-10-13 DIAGNOSIS — I255 Ischemic cardiomyopathy: Secondary | ICD-10-CM | POA: Diagnosis not present

## 2018-10-13 DIAGNOSIS — Z95810 Presence of automatic (implantable) cardiac defibrillator: Secondary | ICD-10-CM | POA: Diagnosis not present

## 2018-10-13 DIAGNOSIS — N183 Chronic kidney disease, stage 3 (moderate): Secondary | ICD-10-CM | POA: Diagnosis not present

## 2018-10-13 DIAGNOSIS — R52 Pain, unspecified: Secondary | ICD-10-CM | POA: Diagnosis not present

## 2018-10-13 DIAGNOSIS — R0600 Dyspnea, unspecified: Secondary | ICD-10-CM | POA: Diagnosis not present

## 2018-10-13 DIAGNOSIS — Z9981 Dependence on supplemental oxygen: Secondary | ICD-10-CM | POA: Diagnosis not present

## 2018-10-13 DIAGNOSIS — Z951 Presence of aortocoronary bypass graft: Secondary | ICD-10-CM | POA: Diagnosis not present

## 2018-10-13 DIAGNOSIS — R5383 Other fatigue: Secondary | ICD-10-CM | POA: Diagnosis not present

## 2018-10-13 DIAGNOSIS — Z96653 Presence of artificial knee joint, bilateral: Secondary | ICD-10-CM | POA: Diagnosis not present

## 2018-10-13 DIAGNOSIS — E785 Hyperlipidemia, unspecified: Secondary | ICD-10-CM | POA: Diagnosis not present

## 2018-10-13 DIAGNOSIS — G4089 Other seizures: Secondary | ICD-10-CM | POA: Diagnosis not present

## 2018-10-13 DIAGNOSIS — R05 Cough: Secondary | ICD-10-CM | POA: Diagnosis not present

## 2018-10-13 DIAGNOSIS — R600 Localized edema: Secondary | ICD-10-CM | POA: Diagnosis not present

## 2018-10-13 DIAGNOSIS — J449 Chronic obstructive pulmonary disease, unspecified: Secondary | ICD-10-CM | POA: Diagnosis not present

## 2018-10-13 DIAGNOSIS — U071 COVID-19: Secondary | ICD-10-CM | POA: Diagnosis not present

## 2018-10-13 DIAGNOSIS — I130 Hypertensive heart and chronic kidney disease with heart failure and stage 1 through stage 4 chronic kidney disease, or unspecified chronic kidney disease: Secondary | ICD-10-CM | POA: Diagnosis not present

## 2018-10-13 DIAGNOSIS — I2510 Atherosclerotic heart disease of native coronary artery without angina pectoris: Secondary | ICD-10-CM | POA: Diagnosis not present

## 2018-10-14 DIAGNOSIS — R0600 Dyspnea, unspecified: Secondary | ICD-10-CM | POA: Diagnosis not present

## 2018-10-14 DIAGNOSIS — R52 Pain, unspecified: Secondary | ICD-10-CM | POA: Diagnosis not present

## 2018-10-14 DIAGNOSIS — N183 Chronic kidney disease, stage 3 (moderate): Secondary | ICD-10-CM | POA: Diagnosis not present

## 2018-10-14 DIAGNOSIS — I255 Ischemic cardiomyopathy: Secondary | ICD-10-CM | POA: Diagnosis not present

## 2018-10-14 DIAGNOSIS — I130 Hypertensive heart and chronic kidney disease with heart failure and stage 1 through stage 4 chronic kidney disease, or unspecified chronic kidney disease: Secondary | ICD-10-CM | POA: Diagnosis not present

## 2018-10-14 DIAGNOSIS — I5042 Chronic combined systolic (congestive) and diastolic (congestive) heart failure: Secondary | ICD-10-CM | POA: Diagnosis not present

## 2018-10-16 DIAGNOSIS — N183 Chronic kidney disease, stage 3 (moderate): Secondary | ICD-10-CM | POA: Diagnosis not present

## 2018-10-16 DIAGNOSIS — I255 Ischemic cardiomyopathy: Secondary | ICD-10-CM | POA: Diagnosis not present

## 2018-10-16 DIAGNOSIS — I130 Hypertensive heart and chronic kidney disease with heart failure and stage 1 through stage 4 chronic kidney disease, or unspecified chronic kidney disease: Secondary | ICD-10-CM | POA: Diagnosis not present

## 2018-10-16 DIAGNOSIS — I5042 Chronic combined systolic (congestive) and diastolic (congestive) heart failure: Secondary | ICD-10-CM | POA: Diagnosis not present

## 2018-10-16 DIAGNOSIS — R0600 Dyspnea, unspecified: Secondary | ICD-10-CM | POA: Diagnosis not present

## 2018-10-16 DIAGNOSIS — R52 Pain, unspecified: Secondary | ICD-10-CM | POA: Diagnosis not present

## 2018-10-19 DIAGNOSIS — R0600 Dyspnea, unspecified: Secondary | ICD-10-CM | POA: Diagnosis not present

## 2018-10-19 DIAGNOSIS — N183 Chronic kidney disease, stage 3 (moderate): Secondary | ICD-10-CM | POA: Diagnosis not present

## 2018-10-19 DIAGNOSIS — R52 Pain, unspecified: Secondary | ICD-10-CM | POA: Diagnosis not present

## 2018-10-19 DIAGNOSIS — I130 Hypertensive heart and chronic kidney disease with heart failure and stage 1 through stage 4 chronic kidney disease, or unspecified chronic kidney disease: Secondary | ICD-10-CM | POA: Diagnosis not present

## 2018-10-19 DIAGNOSIS — I255 Ischemic cardiomyopathy: Secondary | ICD-10-CM | POA: Diagnosis not present

## 2018-10-19 DIAGNOSIS — I5042 Chronic combined systolic (congestive) and diastolic (congestive) heart failure: Secondary | ICD-10-CM | POA: Diagnosis not present

## 2018-10-20 DIAGNOSIS — I255 Ischemic cardiomyopathy: Secondary | ICD-10-CM | POA: Diagnosis not present

## 2018-10-20 DIAGNOSIS — R0600 Dyspnea, unspecified: Secondary | ICD-10-CM | POA: Diagnosis not present

## 2018-10-20 DIAGNOSIS — I130 Hypertensive heart and chronic kidney disease with heart failure and stage 1 through stage 4 chronic kidney disease, or unspecified chronic kidney disease: Secondary | ICD-10-CM | POA: Diagnosis not present

## 2018-10-20 DIAGNOSIS — R52 Pain, unspecified: Secondary | ICD-10-CM | POA: Diagnosis not present

## 2018-10-20 DIAGNOSIS — I5042 Chronic combined systolic (congestive) and diastolic (congestive) heart failure: Secondary | ICD-10-CM | POA: Diagnosis not present

## 2018-10-20 DIAGNOSIS — N183 Chronic kidney disease, stage 3 (moderate): Secondary | ICD-10-CM | POA: Diagnosis not present

## 2018-10-21 DIAGNOSIS — I255 Ischemic cardiomyopathy: Secondary | ICD-10-CM | POA: Diagnosis not present

## 2018-10-21 DIAGNOSIS — I5042 Chronic combined systolic (congestive) and diastolic (congestive) heart failure: Secondary | ICD-10-CM | POA: Diagnosis not present

## 2018-10-21 DIAGNOSIS — I130 Hypertensive heart and chronic kidney disease with heart failure and stage 1 through stage 4 chronic kidney disease, or unspecified chronic kidney disease: Secondary | ICD-10-CM | POA: Diagnosis not present

## 2018-10-21 DIAGNOSIS — R52 Pain, unspecified: Secondary | ICD-10-CM | POA: Diagnosis not present

## 2018-10-21 DIAGNOSIS — R0600 Dyspnea, unspecified: Secondary | ICD-10-CM | POA: Diagnosis not present

## 2018-10-21 DIAGNOSIS — N183 Chronic kidney disease, stage 3 (moderate): Secondary | ICD-10-CM | POA: Diagnosis not present

## 2018-10-23 DIAGNOSIS — I130 Hypertensive heart and chronic kidney disease with heart failure and stage 1 through stage 4 chronic kidney disease, or unspecified chronic kidney disease: Secondary | ICD-10-CM | POA: Diagnosis not present

## 2018-10-23 DIAGNOSIS — R52 Pain, unspecified: Secondary | ICD-10-CM | POA: Diagnosis not present

## 2018-10-23 DIAGNOSIS — I5042 Chronic combined systolic (congestive) and diastolic (congestive) heart failure: Secondary | ICD-10-CM | POA: Diagnosis not present

## 2018-10-23 DIAGNOSIS — N183 Chronic kidney disease, stage 3 (moderate): Secondary | ICD-10-CM | POA: Diagnosis not present

## 2018-10-23 DIAGNOSIS — R0600 Dyspnea, unspecified: Secondary | ICD-10-CM | POA: Diagnosis not present

## 2018-10-23 DIAGNOSIS — I255 Ischemic cardiomyopathy: Secondary | ICD-10-CM | POA: Diagnosis not present

## 2018-10-26 DIAGNOSIS — R0600 Dyspnea, unspecified: Secondary | ICD-10-CM | POA: Diagnosis not present

## 2018-10-26 DIAGNOSIS — I130 Hypertensive heart and chronic kidney disease with heart failure and stage 1 through stage 4 chronic kidney disease, or unspecified chronic kidney disease: Secondary | ICD-10-CM | POA: Diagnosis not present

## 2018-10-26 DIAGNOSIS — I5042 Chronic combined systolic (congestive) and diastolic (congestive) heart failure: Secondary | ICD-10-CM | POA: Diagnosis not present

## 2018-10-26 DIAGNOSIS — N183 Chronic kidney disease, stage 3 (moderate): Secondary | ICD-10-CM | POA: Diagnosis not present

## 2018-10-26 DIAGNOSIS — R52 Pain, unspecified: Secondary | ICD-10-CM | POA: Diagnosis not present

## 2018-10-26 DIAGNOSIS — I255 Ischemic cardiomyopathy: Secondary | ICD-10-CM | POA: Diagnosis not present

## 2018-10-28 DIAGNOSIS — I255 Ischemic cardiomyopathy: Secondary | ICD-10-CM | POA: Diagnosis not present

## 2018-10-28 DIAGNOSIS — I5042 Chronic combined systolic (congestive) and diastolic (congestive) heart failure: Secondary | ICD-10-CM | POA: Diagnosis not present

## 2018-10-28 DIAGNOSIS — R0600 Dyspnea, unspecified: Secondary | ICD-10-CM | POA: Diagnosis not present

## 2018-10-28 DIAGNOSIS — R52 Pain, unspecified: Secondary | ICD-10-CM | POA: Diagnosis not present

## 2018-10-28 DIAGNOSIS — I130 Hypertensive heart and chronic kidney disease with heart failure and stage 1 through stage 4 chronic kidney disease, or unspecified chronic kidney disease: Secondary | ICD-10-CM | POA: Diagnosis not present

## 2018-10-28 DIAGNOSIS — N183 Chronic kidney disease, stage 3 (moderate): Secondary | ICD-10-CM | POA: Diagnosis not present

## 2018-10-30 DIAGNOSIS — I5042 Chronic combined systolic (congestive) and diastolic (congestive) heart failure: Secondary | ICD-10-CM | POA: Diagnosis not present

## 2018-10-30 DIAGNOSIS — N183 Chronic kidney disease, stage 3 (moderate): Secondary | ICD-10-CM | POA: Diagnosis not present

## 2018-10-30 DIAGNOSIS — I130 Hypertensive heart and chronic kidney disease with heart failure and stage 1 through stage 4 chronic kidney disease, or unspecified chronic kidney disease: Secondary | ICD-10-CM | POA: Diagnosis not present

## 2018-10-30 DIAGNOSIS — I255 Ischemic cardiomyopathy: Secondary | ICD-10-CM | POA: Diagnosis not present

## 2018-10-30 DIAGNOSIS — R52 Pain, unspecified: Secondary | ICD-10-CM | POA: Diagnosis not present

## 2018-10-30 DIAGNOSIS — R0600 Dyspnea, unspecified: Secondary | ICD-10-CM | POA: Diagnosis not present

## 2018-11-02 DIAGNOSIS — G4089 Other seizures: Secondary | ICD-10-CM | POA: Diagnosis not present

## 2018-11-02 DIAGNOSIS — R5383 Other fatigue: Secondary | ICD-10-CM | POA: Diagnosis not present

## 2018-11-02 DIAGNOSIS — I255 Ischemic cardiomyopathy: Secondary | ICD-10-CM | POA: Diagnosis not present

## 2018-11-02 DIAGNOSIS — R52 Pain, unspecified: Secondary | ICD-10-CM | POA: Diagnosis not present

## 2018-11-02 DIAGNOSIS — I130 Hypertensive heart and chronic kidney disease with heart failure and stage 1 through stage 4 chronic kidney disease, or unspecified chronic kidney disease: Secondary | ICD-10-CM | POA: Diagnosis not present

## 2018-11-02 DIAGNOSIS — R1310 Dysphagia, unspecified: Secondary | ICD-10-CM | POA: Diagnosis not present

## 2018-11-02 DIAGNOSIS — N183 Chronic kidney disease, stage 3 (moderate): Secondary | ICD-10-CM | POA: Diagnosis not present

## 2018-11-02 DIAGNOSIS — Z951 Presence of aortocoronary bypass graft: Secondary | ICD-10-CM | POA: Diagnosis not present

## 2018-11-02 DIAGNOSIS — I5042 Chronic combined systolic (congestive) and diastolic (congestive) heart failure: Secondary | ICD-10-CM | POA: Diagnosis not present

## 2018-11-02 DIAGNOSIS — U071 COVID-19: Secondary | ICD-10-CM | POA: Diagnosis not present

## 2018-11-02 DIAGNOSIS — I2510 Atherosclerotic heart disease of native coronary artery without angina pectoris: Secondary | ICD-10-CM | POA: Diagnosis not present

## 2018-11-02 DIAGNOSIS — Z66 Do not resuscitate: Secondary | ICD-10-CM | POA: Diagnosis not present

## 2018-11-02 DIAGNOSIS — Z9981 Dependence on supplemental oxygen: Secondary | ICD-10-CM | POA: Diagnosis not present

## 2018-11-02 DIAGNOSIS — Z7901 Long term (current) use of anticoagulants: Secondary | ICD-10-CM | POA: Diagnosis not present

## 2018-11-02 DIAGNOSIS — Z9181 History of falling: Secondary | ICD-10-CM | POA: Diagnosis not present

## 2018-11-02 DIAGNOSIS — Z95810 Presence of automatic (implantable) cardiac defibrillator: Secondary | ICD-10-CM | POA: Diagnosis not present

## 2018-11-02 DIAGNOSIS — E785 Hyperlipidemia, unspecified: Secondary | ICD-10-CM | POA: Diagnosis not present

## 2018-11-02 DIAGNOSIS — R600 Localized edema: Secondary | ICD-10-CM | POA: Diagnosis not present

## 2018-11-02 DIAGNOSIS — R05 Cough: Secondary | ICD-10-CM | POA: Diagnosis not present

## 2018-11-02 DIAGNOSIS — Z96653 Presence of artificial knee joint, bilateral: Secondary | ICD-10-CM | POA: Diagnosis not present

## 2018-11-02 DIAGNOSIS — E039 Hypothyroidism, unspecified: Secondary | ICD-10-CM | POA: Diagnosis not present

## 2018-11-02 DIAGNOSIS — J449 Chronic obstructive pulmonary disease, unspecified: Secondary | ICD-10-CM | POA: Diagnosis not present

## 2018-11-02 DIAGNOSIS — R0600 Dyspnea, unspecified: Secondary | ICD-10-CM | POA: Diagnosis not present

## 2018-11-02 DIAGNOSIS — I480 Paroxysmal atrial fibrillation: Secondary | ICD-10-CM | POA: Diagnosis not present

## 2018-11-02 DIAGNOSIS — K219 Gastro-esophageal reflux disease without esophagitis: Secondary | ICD-10-CM | POA: Diagnosis not present

## 2018-11-03 DIAGNOSIS — I255 Ischemic cardiomyopathy: Secondary | ICD-10-CM | POA: Diagnosis not present

## 2018-11-03 DIAGNOSIS — R0600 Dyspnea, unspecified: Secondary | ICD-10-CM | POA: Diagnosis not present

## 2018-11-03 DIAGNOSIS — I5042 Chronic combined systolic (congestive) and diastolic (congestive) heart failure: Secondary | ICD-10-CM | POA: Diagnosis not present

## 2018-11-03 DIAGNOSIS — N183 Chronic kidney disease, stage 3 (moderate): Secondary | ICD-10-CM | POA: Diagnosis not present

## 2018-11-03 DIAGNOSIS — I130 Hypertensive heart and chronic kidney disease with heart failure and stage 1 through stage 4 chronic kidney disease, or unspecified chronic kidney disease: Secondary | ICD-10-CM | POA: Diagnosis not present

## 2018-11-03 DIAGNOSIS — R52 Pain, unspecified: Secondary | ICD-10-CM | POA: Diagnosis not present

## 2018-11-04 DIAGNOSIS — I255 Ischemic cardiomyopathy: Secondary | ICD-10-CM | POA: Diagnosis not present

## 2018-11-04 DIAGNOSIS — I130 Hypertensive heart and chronic kidney disease with heart failure and stage 1 through stage 4 chronic kidney disease, or unspecified chronic kidney disease: Secondary | ICD-10-CM | POA: Diagnosis not present

## 2018-11-04 DIAGNOSIS — N183 Chronic kidney disease, stage 3 (moderate): Secondary | ICD-10-CM | POA: Diagnosis not present

## 2018-11-04 DIAGNOSIS — R0600 Dyspnea, unspecified: Secondary | ICD-10-CM | POA: Diagnosis not present

## 2018-11-04 DIAGNOSIS — I5042 Chronic combined systolic (congestive) and diastolic (congestive) heart failure: Secondary | ICD-10-CM | POA: Diagnosis not present

## 2018-11-04 DIAGNOSIS — R52 Pain, unspecified: Secondary | ICD-10-CM | POA: Diagnosis not present

## 2018-11-06 DIAGNOSIS — R52 Pain, unspecified: Secondary | ICD-10-CM | POA: Diagnosis not present

## 2018-11-06 DIAGNOSIS — I255 Ischemic cardiomyopathy: Secondary | ICD-10-CM | POA: Diagnosis not present

## 2018-11-06 DIAGNOSIS — I5042 Chronic combined systolic (congestive) and diastolic (congestive) heart failure: Secondary | ICD-10-CM | POA: Diagnosis not present

## 2018-11-06 DIAGNOSIS — R0600 Dyspnea, unspecified: Secondary | ICD-10-CM | POA: Diagnosis not present

## 2018-11-06 DIAGNOSIS — N183 Chronic kidney disease, stage 3 (moderate): Secondary | ICD-10-CM | POA: Diagnosis not present

## 2018-11-06 DIAGNOSIS — I130 Hypertensive heart and chronic kidney disease with heart failure and stage 1 through stage 4 chronic kidney disease, or unspecified chronic kidney disease: Secondary | ICD-10-CM | POA: Diagnosis not present

## 2018-11-09 DIAGNOSIS — R0600 Dyspnea, unspecified: Secondary | ICD-10-CM | POA: Diagnosis not present

## 2018-11-09 DIAGNOSIS — I255 Ischemic cardiomyopathy: Secondary | ICD-10-CM | POA: Diagnosis not present

## 2018-11-09 DIAGNOSIS — I5042 Chronic combined systolic (congestive) and diastolic (congestive) heart failure: Secondary | ICD-10-CM | POA: Diagnosis not present

## 2018-11-09 DIAGNOSIS — I130 Hypertensive heart and chronic kidney disease with heart failure and stage 1 through stage 4 chronic kidney disease, or unspecified chronic kidney disease: Secondary | ICD-10-CM | POA: Diagnosis not present

## 2018-11-09 DIAGNOSIS — N183 Chronic kidney disease, stage 3 (moderate): Secondary | ICD-10-CM | POA: Diagnosis not present

## 2018-11-09 DIAGNOSIS — R52 Pain, unspecified: Secondary | ICD-10-CM | POA: Diagnosis not present

## 2018-11-11 DIAGNOSIS — I255 Ischemic cardiomyopathy: Secondary | ICD-10-CM | POA: Diagnosis not present

## 2018-11-11 DIAGNOSIS — I5042 Chronic combined systolic (congestive) and diastolic (congestive) heart failure: Secondary | ICD-10-CM | POA: Diagnosis not present

## 2018-11-11 DIAGNOSIS — N183 Chronic kidney disease, stage 3 (moderate): Secondary | ICD-10-CM | POA: Diagnosis not present

## 2018-11-11 DIAGNOSIS — I130 Hypertensive heart and chronic kidney disease with heart failure and stage 1 through stage 4 chronic kidney disease, or unspecified chronic kidney disease: Secondary | ICD-10-CM | POA: Diagnosis not present

## 2018-11-11 DIAGNOSIS — R0600 Dyspnea, unspecified: Secondary | ICD-10-CM | POA: Diagnosis not present

## 2018-11-11 DIAGNOSIS — R52 Pain, unspecified: Secondary | ICD-10-CM | POA: Diagnosis not present

## 2018-11-12 DIAGNOSIS — I130 Hypertensive heart and chronic kidney disease with heart failure and stage 1 through stage 4 chronic kidney disease, or unspecified chronic kidney disease: Secondary | ICD-10-CM | POA: Diagnosis not present

## 2018-11-12 DIAGNOSIS — I480 Paroxysmal atrial fibrillation: Secondary | ICD-10-CM | POA: Diagnosis not present

## 2018-11-12 DIAGNOSIS — Z9981 Dependence on supplemental oxygen: Secondary | ICD-10-CM | POA: Diagnosis not present

## 2018-11-12 DIAGNOSIS — E785 Hyperlipidemia, unspecified: Secondary | ICD-10-CM | POA: Diagnosis not present

## 2018-11-12 DIAGNOSIS — G4089 Other seizures: Secondary | ICD-10-CM | POA: Diagnosis not present

## 2018-11-12 DIAGNOSIS — I2510 Atherosclerotic heart disease of native coronary artery without angina pectoris: Secondary | ICD-10-CM | POA: Diagnosis not present

## 2018-11-12 DIAGNOSIS — K219 Gastro-esophageal reflux disease without esophagitis: Secondary | ICD-10-CM | POA: Diagnosis not present

## 2018-11-12 DIAGNOSIS — R600 Localized edema: Secondary | ICD-10-CM | POA: Diagnosis not present

## 2018-11-12 DIAGNOSIS — N183 Chronic kidney disease, stage 3 unspecified: Secondary | ICD-10-CM | POA: Diagnosis not present

## 2018-11-12 DIAGNOSIS — R5383 Other fatigue: Secondary | ICD-10-CM | POA: Diagnosis not present

## 2018-11-12 DIAGNOSIS — Z951 Presence of aortocoronary bypass graft: Secondary | ICD-10-CM | POA: Diagnosis not present

## 2018-11-12 DIAGNOSIS — Z95810 Presence of automatic (implantable) cardiac defibrillator: Secondary | ICD-10-CM | POA: Diagnosis not present

## 2018-11-12 DIAGNOSIS — R0600 Dyspnea, unspecified: Secondary | ICD-10-CM | POA: Diagnosis not present

## 2018-11-12 DIAGNOSIS — J449 Chronic obstructive pulmonary disease, unspecified: Secondary | ICD-10-CM | POA: Diagnosis not present

## 2018-11-12 DIAGNOSIS — R05 Cough: Secondary | ICD-10-CM | POA: Diagnosis not present

## 2018-11-12 DIAGNOSIS — R1310 Dysphagia, unspecified: Secondary | ICD-10-CM | POA: Diagnosis not present

## 2018-11-12 DIAGNOSIS — E039 Hypothyroidism, unspecified: Secondary | ICD-10-CM | POA: Diagnosis not present

## 2018-11-12 DIAGNOSIS — I255 Ischemic cardiomyopathy: Secondary | ICD-10-CM | POA: Diagnosis not present

## 2018-11-12 DIAGNOSIS — R52 Pain, unspecified: Secondary | ICD-10-CM | POA: Diagnosis not present

## 2018-11-12 DIAGNOSIS — Z7901 Long term (current) use of anticoagulants: Secondary | ICD-10-CM | POA: Diagnosis not present

## 2018-11-12 DIAGNOSIS — Z66 Do not resuscitate: Secondary | ICD-10-CM | POA: Diagnosis not present

## 2018-11-12 DIAGNOSIS — Z96653 Presence of artificial knee joint, bilateral: Secondary | ICD-10-CM | POA: Diagnosis not present

## 2018-11-12 DIAGNOSIS — I5042 Chronic combined systolic (congestive) and diastolic (congestive) heart failure: Secondary | ICD-10-CM | POA: Diagnosis not present

## 2018-11-12 DIAGNOSIS — Z9181 History of falling: Secondary | ICD-10-CM | POA: Diagnosis not present

## 2018-11-12 DIAGNOSIS — U071 COVID-19: Secondary | ICD-10-CM | POA: Diagnosis not present

## 2018-11-13 DIAGNOSIS — R52 Pain, unspecified: Secondary | ICD-10-CM | POA: Diagnosis not present

## 2018-11-13 DIAGNOSIS — I130 Hypertensive heart and chronic kidney disease with heart failure and stage 1 through stage 4 chronic kidney disease, or unspecified chronic kidney disease: Secondary | ICD-10-CM | POA: Diagnosis not present

## 2018-11-13 DIAGNOSIS — I255 Ischemic cardiomyopathy: Secondary | ICD-10-CM | POA: Diagnosis not present

## 2018-11-13 DIAGNOSIS — R0600 Dyspnea, unspecified: Secondary | ICD-10-CM | POA: Diagnosis not present

## 2018-11-13 DIAGNOSIS — I5042 Chronic combined systolic (congestive) and diastolic (congestive) heart failure: Secondary | ICD-10-CM | POA: Diagnosis not present

## 2018-11-13 DIAGNOSIS — N183 Chronic kidney disease, stage 3 unspecified: Secondary | ICD-10-CM | POA: Diagnosis not present

## 2018-11-16 DIAGNOSIS — I5042 Chronic combined systolic (congestive) and diastolic (congestive) heart failure: Secondary | ICD-10-CM | POA: Diagnosis not present

## 2018-11-16 DIAGNOSIS — R52 Pain, unspecified: Secondary | ICD-10-CM | POA: Diagnosis not present

## 2018-11-16 DIAGNOSIS — I255 Ischemic cardiomyopathy: Secondary | ICD-10-CM | POA: Diagnosis not present

## 2018-11-16 DIAGNOSIS — N183 Chronic kidney disease, stage 3 unspecified: Secondary | ICD-10-CM | POA: Diagnosis not present

## 2018-11-16 DIAGNOSIS — R0600 Dyspnea, unspecified: Secondary | ICD-10-CM | POA: Diagnosis not present

## 2018-11-16 DIAGNOSIS — I130 Hypertensive heart and chronic kidney disease with heart failure and stage 1 through stage 4 chronic kidney disease, or unspecified chronic kidney disease: Secondary | ICD-10-CM | POA: Diagnosis not present

## 2018-11-18 DIAGNOSIS — N183 Chronic kidney disease, stage 3 unspecified: Secondary | ICD-10-CM | POA: Diagnosis not present

## 2018-11-18 DIAGNOSIS — I130 Hypertensive heart and chronic kidney disease with heart failure and stage 1 through stage 4 chronic kidney disease, or unspecified chronic kidney disease: Secondary | ICD-10-CM | POA: Diagnosis not present

## 2018-11-18 DIAGNOSIS — R52 Pain, unspecified: Secondary | ICD-10-CM | POA: Diagnosis not present

## 2018-11-18 DIAGNOSIS — R0600 Dyspnea, unspecified: Secondary | ICD-10-CM | POA: Diagnosis not present

## 2018-11-18 DIAGNOSIS — I5042 Chronic combined systolic (congestive) and diastolic (congestive) heart failure: Secondary | ICD-10-CM | POA: Diagnosis not present

## 2018-11-18 DIAGNOSIS — I255 Ischemic cardiomyopathy: Secondary | ICD-10-CM | POA: Diagnosis not present

## 2018-11-20 DIAGNOSIS — I255 Ischemic cardiomyopathy: Secondary | ICD-10-CM | POA: Diagnosis not present

## 2018-11-20 DIAGNOSIS — I130 Hypertensive heart and chronic kidney disease with heart failure and stage 1 through stage 4 chronic kidney disease, or unspecified chronic kidney disease: Secondary | ICD-10-CM | POA: Diagnosis not present

## 2018-11-20 DIAGNOSIS — N183 Chronic kidney disease, stage 3 unspecified: Secondary | ICD-10-CM | POA: Diagnosis not present

## 2018-11-20 DIAGNOSIS — R52 Pain, unspecified: Secondary | ICD-10-CM | POA: Diagnosis not present

## 2018-11-20 DIAGNOSIS — R0600 Dyspnea, unspecified: Secondary | ICD-10-CM | POA: Diagnosis not present

## 2018-11-20 DIAGNOSIS — I5042 Chronic combined systolic (congestive) and diastolic (congestive) heart failure: Secondary | ICD-10-CM | POA: Diagnosis not present

## 2018-11-23 DIAGNOSIS — I255 Ischemic cardiomyopathy: Secondary | ICD-10-CM | POA: Diagnosis not present

## 2018-11-23 DIAGNOSIS — N183 Chronic kidney disease, stage 3 unspecified: Secondary | ICD-10-CM | POA: Diagnosis not present

## 2018-11-23 DIAGNOSIS — I5042 Chronic combined systolic (congestive) and diastolic (congestive) heart failure: Secondary | ICD-10-CM | POA: Diagnosis not present

## 2018-11-23 DIAGNOSIS — I130 Hypertensive heart and chronic kidney disease with heart failure and stage 1 through stage 4 chronic kidney disease, or unspecified chronic kidney disease: Secondary | ICD-10-CM | POA: Diagnosis not present

## 2018-11-23 DIAGNOSIS — R52 Pain, unspecified: Secondary | ICD-10-CM | POA: Diagnosis not present

## 2018-11-23 DIAGNOSIS — R0600 Dyspnea, unspecified: Secondary | ICD-10-CM | POA: Diagnosis not present

## 2018-11-25 DIAGNOSIS — N183 Chronic kidney disease, stage 3 unspecified: Secondary | ICD-10-CM | POA: Diagnosis not present

## 2018-11-25 DIAGNOSIS — I5042 Chronic combined systolic (congestive) and diastolic (congestive) heart failure: Secondary | ICD-10-CM | POA: Diagnosis not present

## 2018-11-25 DIAGNOSIS — I255 Ischemic cardiomyopathy: Secondary | ICD-10-CM | POA: Diagnosis not present

## 2018-11-25 DIAGNOSIS — R0600 Dyspnea, unspecified: Secondary | ICD-10-CM | POA: Diagnosis not present

## 2018-11-25 DIAGNOSIS — R52 Pain, unspecified: Secondary | ICD-10-CM | POA: Diagnosis not present

## 2018-11-25 DIAGNOSIS — I130 Hypertensive heart and chronic kidney disease with heart failure and stage 1 through stage 4 chronic kidney disease, or unspecified chronic kidney disease: Secondary | ICD-10-CM | POA: Diagnosis not present

## 2018-11-27 DIAGNOSIS — I255 Ischemic cardiomyopathy: Secondary | ICD-10-CM | POA: Diagnosis not present

## 2018-11-27 DIAGNOSIS — I5042 Chronic combined systolic (congestive) and diastolic (congestive) heart failure: Secondary | ICD-10-CM | POA: Diagnosis not present

## 2018-11-27 DIAGNOSIS — R52 Pain, unspecified: Secondary | ICD-10-CM | POA: Diagnosis not present

## 2018-11-27 DIAGNOSIS — R0600 Dyspnea, unspecified: Secondary | ICD-10-CM | POA: Diagnosis not present

## 2018-11-27 DIAGNOSIS — N183 Chronic kidney disease, stage 3 unspecified: Secondary | ICD-10-CM | POA: Diagnosis not present

## 2018-11-27 DIAGNOSIS — I130 Hypertensive heart and chronic kidney disease with heart failure and stage 1 through stage 4 chronic kidney disease, or unspecified chronic kidney disease: Secondary | ICD-10-CM | POA: Diagnosis not present

## 2018-11-29 DIAGNOSIS — I5042 Chronic combined systolic (congestive) and diastolic (congestive) heart failure: Secondary | ICD-10-CM | POA: Diagnosis not present

## 2018-11-29 DIAGNOSIS — R0600 Dyspnea, unspecified: Secondary | ICD-10-CM | POA: Diagnosis not present

## 2018-11-29 DIAGNOSIS — R52 Pain, unspecified: Secondary | ICD-10-CM | POA: Diagnosis not present

## 2018-11-29 DIAGNOSIS — I255 Ischemic cardiomyopathy: Secondary | ICD-10-CM | POA: Diagnosis not present

## 2018-11-29 DIAGNOSIS — N183 Chronic kidney disease, stage 3 unspecified: Secondary | ICD-10-CM | POA: Diagnosis not present

## 2018-11-29 DIAGNOSIS — I130 Hypertensive heart and chronic kidney disease with heart failure and stage 1 through stage 4 chronic kidney disease, or unspecified chronic kidney disease: Secondary | ICD-10-CM | POA: Diagnosis not present

## 2018-11-30 DIAGNOSIS — I255 Ischemic cardiomyopathy: Secondary | ICD-10-CM | POA: Diagnosis not present

## 2018-11-30 DIAGNOSIS — N183 Chronic kidney disease, stage 3 unspecified: Secondary | ICD-10-CM | POA: Diagnosis not present

## 2018-11-30 DIAGNOSIS — R52 Pain, unspecified: Secondary | ICD-10-CM | POA: Diagnosis not present

## 2018-11-30 DIAGNOSIS — I130 Hypertensive heart and chronic kidney disease with heart failure and stage 1 through stage 4 chronic kidney disease, or unspecified chronic kidney disease: Secondary | ICD-10-CM | POA: Diagnosis not present

## 2018-11-30 DIAGNOSIS — I5042 Chronic combined systolic (congestive) and diastolic (congestive) heart failure: Secondary | ICD-10-CM | POA: Diagnosis not present

## 2018-11-30 DIAGNOSIS — R0600 Dyspnea, unspecified: Secondary | ICD-10-CM | POA: Diagnosis not present

## 2018-12-02 DIAGNOSIS — R52 Pain, unspecified: Secondary | ICD-10-CM | POA: Diagnosis not present

## 2018-12-02 DIAGNOSIS — I255 Ischemic cardiomyopathy: Secondary | ICD-10-CM | POA: Diagnosis not present

## 2018-12-02 DIAGNOSIS — I5042 Chronic combined systolic (congestive) and diastolic (congestive) heart failure: Secondary | ICD-10-CM | POA: Diagnosis not present

## 2018-12-02 DIAGNOSIS — N183 Chronic kidney disease, stage 3 unspecified: Secondary | ICD-10-CM | POA: Diagnosis not present

## 2018-12-02 DIAGNOSIS — I130 Hypertensive heart and chronic kidney disease with heart failure and stage 1 through stage 4 chronic kidney disease, or unspecified chronic kidney disease: Secondary | ICD-10-CM | POA: Diagnosis not present

## 2018-12-02 DIAGNOSIS — R0600 Dyspnea, unspecified: Secondary | ICD-10-CM | POA: Diagnosis not present

## 2018-12-04 DIAGNOSIS — I5042 Chronic combined systolic (congestive) and diastolic (congestive) heart failure: Secondary | ICD-10-CM | POA: Diagnosis not present

## 2018-12-04 DIAGNOSIS — R0600 Dyspnea, unspecified: Secondary | ICD-10-CM | POA: Diagnosis not present

## 2018-12-04 DIAGNOSIS — R52 Pain, unspecified: Secondary | ICD-10-CM | POA: Diagnosis not present

## 2018-12-04 DIAGNOSIS — I130 Hypertensive heart and chronic kidney disease with heart failure and stage 1 through stage 4 chronic kidney disease, or unspecified chronic kidney disease: Secondary | ICD-10-CM | POA: Diagnosis not present

## 2018-12-04 DIAGNOSIS — N183 Chronic kidney disease, stage 3 unspecified: Secondary | ICD-10-CM | POA: Diagnosis not present

## 2018-12-04 DIAGNOSIS — I255 Ischemic cardiomyopathy: Secondary | ICD-10-CM | POA: Diagnosis not present

## 2018-12-07 DIAGNOSIS — I130 Hypertensive heart and chronic kidney disease with heart failure and stage 1 through stage 4 chronic kidney disease, or unspecified chronic kidney disease: Secondary | ICD-10-CM | POA: Diagnosis not present

## 2018-12-07 DIAGNOSIS — I5042 Chronic combined systolic (congestive) and diastolic (congestive) heart failure: Secondary | ICD-10-CM | POA: Diagnosis not present

## 2018-12-07 DIAGNOSIS — N183 Chronic kidney disease, stage 3 unspecified: Secondary | ICD-10-CM | POA: Diagnosis not present

## 2018-12-07 DIAGNOSIS — R52 Pain, unspecified: Secondary | ICD-10-CM | POA: Diagnosis not present

## 2018-12-07 DIAGNOSIS — R0600 Dyspnea, unspecified: Secondary | ICD-10-CM | POA: Diagnosis not present

## 2018-12-07 DIAGNOSIS — I255 Ischemic cardiomyopathy: Secondary | ICD-10-CM | POA: Diagnosis not present

## 2018-12-09 DIAGNOSIS — I5042 Chronic combined systolic (congestive) and diastolic (congestive) heart failure: Secondary | ICD-10-CM | POA: Diagnosis not present

## 2018-12-09 DIAGNOSIS — R52 Pain, unspecified: Secondary | ICD-10-CM | POA: Diagnosis not present

## 2018-12-09 DIAGNOSIS — R0600 Dyspnea, unspecified: Secondary | ICD-10-CM | POA: Diagnosis not present

## 2018-12-09 DIAGNOSIS — I255 Ischemic cardiomyopathy: Secondary | ICD-10-CM | POA: Diagnosis not present

## 2018-12-09 DIAGNOSIS — N183 Chronic kidney disease, stage 3 unspecified: Secondary | ICD-10-CM | POA: Diagnosis not present

## 2018-12-09 DIAGNOSIS — I130 Hypertensive heart and chronic kidney disease with heart failure and stage 1 through stage 4 chronic kidney disease, or unspecified chronic kidney disease: Secondary | ICD-10-CM | POA: Diagnosis not present

## 2018-12-10 DIAGNOSIS — I5042 Chronic combined systolic (congestive) and diastolic (congestive) heart failure: Secondary | ICD-10-CM | POA: Diagnosis not present

## 2018-12-10 DIAGNOSIS — E039 Hypothyroidism, unspecified: Secondary | ICD-10-CM | POA: Diagnosis not present

## 2018-12-10 DIAGNOSIS — I130 Hypertensive heart and chronic kidney disease with heart failure and stage 1 through stage 4 chronic kidney disease, or unspecified chronic kidney disease: Secondary | ICD-10-CM | POA: Diagnosis not present

## 2018-12-10 DIAGNOSIS — I255 Ischemic cardiomyopathy: Secondary | ICD-10-CM | POA: Diagnosis not present

## 2018-12-10 DIAGNOSIS — R52 Pain, unspecified: Secondary | ICD-10-CM | POA: Diagnosis not present

## 2018-12-10 DIAGNOSIS — I509 Heart failure, unspecified: Secondary | ICD-10-CM | POA: Diagnosis not present

## 2018-12-10 DIAGNOSIS — R0600 Dyspnea, unspecified: Secondary | ICD-10-CM | POA: Diagnosis not present

## 2018-12-10 DIAGNOSIS — N183 Chronic kidney disease, stage 3 unspecified: Secondary | ICD-10-CM | POA: Diagnosis not present

## 2018-12-10 DIAGNOSIS — N189 Chronic kidney disease, unspecified: Secondary | ICD-10-CM | POA: Diagnosis not present

## 2018-12-11 DIAGNOSIS — R52 Pain, unspecified: Secondary | ICD-10-CM | POA: Diagnosis not present

## 2018-12-11 DIAGNOSIS — I5042 Chronic combined systolic (congestive) and diastolic (congestive) heart failure: Secondary | ICD-10-CM | POA: Diagnosis not present

## 2018-12-11 DIAGNOSIS — I130 Hypertensive heart and chronic kidney disease with heart failure and stage 1 through stage 4 chronic kidney disease, or unspecified chronic kidney disease: Secondary | ICD-10-CM | POA: Diagnosis not present

## 2018-12-11 DIAGNOSIS — N183 Chronic kidney disease, stage 3 unspecified: Secondary | ICD-10-CM | POA: Diagnosis not present

## 2018-12-11 DIAGNOSIS — I255 Ischemic cardiomyopathy: Secondary | ICD-10-CM | POA: Diagnosis not present

## 2018-12-11 DIAGNOSIS — R0600 Dyspnea, unspecified: Secondary | ICD-10-CM | POA: Diagnosis not present

## 2018-12-13 DIAGNOSIS — Z9181 History of falling: Secondary | ICD-10-CM | POA: Diagnosis not present

## 2018-12-13 DIAGNOSIS — I350 Nonrheumatic aortic (valve) stenosis: Secondary | ICD-10-CM | POA: Diagnosis present

## 2018-12-13 DIAGNOSIS — Z9981 Dependence on supplemental oxygen: Secondary | ICD-10-CM | POA: Diagnosis not present

## 2018-12-13 DIAGNOSIS — N183 Chronic kidney disease, stage 3 unspecified: Secondary | ICD-10-CM | POA: Diagnosis not present

## 2018-12-13 DIAGNOSIS — E869 Volume depletion, unspecified: Secondary | ICD-10-CM | POA: Diagnosis present

## 2018-12-13 DIAGNOSIS — Z951 Presence of aortocoronary bypass graft: Secondary | ICD-10-CM | POA: Diagnosis not present

## 2018-12-13 DIAGNOSIS — I5022 Chronic systolic (congestive) heart failure: Secondary | ICD-10-CM | POA: Diagnosis present

## 2018-12-13 DIAGNOSIS — R1310 Dysphagia, unspecified: Secondary | ICD-10-CM | POA: Diagnosis not present

## 2018-12-13 DIAGNOSIS — D500 Iron deficiency anemia secondary to blood loss (chronic): Secondary | ICD-10-CM | POA: Diagnosis not present

## 2018-12-13 DIAGNOSIS — E039 Hypothyroidism, unspecified: Secondary | ICD-10-CM | POA: Diagnosis present

## 2018-12-13 DIAGNOSIS — I959 Hypotension, unspecified: Secondary | ICD-10-CM | POA: Diagnosis not present

## 2018-12-13 DIAGNOSIS — U071 COVID-19: Secondary | ICD-10-CM | POA: Diagnosis not present

## 2018-12-13 DIAGNOSIS — R600 Localized edema: Secondary | ICD-10-CM | POA: Diagnosis not present

## 2018-12-13 DIAGNOSIS — R52 Pain, unspecified: Secondary | ICD-10-CM | POA: Diagnosis not present

## 2018-12-13 DIAGNOSIS — I2510 Atherosclerotic heart disease of native coronary artery without angina pectoris: Secondary | ICD-10-CM | POA: Diagnosis present

## 2018-12-13 DIAGNOSIS — K8020 Calculus of gallbladder without cholecystitis without obstruction: Secondary | ICD-10-CM | POA: Diagnosis present

## 2018-12-13 DIAGNOSIS — I21A1 Myocardial infarction type 2: Secondary | ICD-10-CM | POA: Diagnosis not present

## 2018-12-13 DIAGNOSIS — R0602 Shortness of breath: Secondary | ICD-10-CM | POA: Diagnosis not present

## 2018-12-13 DIAGNOSIS — I10 Essential (primary) hypertension: Secondary | ICD-10-CM | POA: Diagnosis not present

## 2018-12-13 DIAGNOSIS — Z7189 Other specified counseling: Secondary | ICD-10-CM | POA: Diagnosis not present

## 2018-12-13 DIAGNOSIS — Z7689 Persons encountering health services in other specified circumstances: Secondary | ICD-10-CM | POA: Diagnosis not present

## 2018-12-13 DIAGNOSIS — R5383 Other fatigue: Secondary | ICD-10-CM | POA: Diagnosis not present

## 2018-12-13 DIAGNOSIS — I252 Old myocardial infarction: Secondary | ICD-10-CM | POA: Diagnosis not present

## 2018-12-13 DIAGNOSIS — D62 Acute posthemorrhagic anemia: Secondary | ICD-10-CM | POA: Diagnosis not present

## 2018-12-13 DIAGNOSIS — K219 Gastro-esophageal reflux disease without esophagitis: Secondary | ICD-10-CM | POA: Diagnosis present

## 2018-12-13 DIAGNOSIS — E785 Hyperlipidemia, unspecified: Secondary | ICD-10-CM | POA: Diagnosis not present

## 2018-12-13 DIAGNOSIS — G4089 Other seizures: Secondary | ICD-10-CM | POA: Diagnosis not present

## 2018-12-13 DIAGNOSIS — Z95810 Presence of automatic (implantable) cardiac defibrillator: Secondary | ICD-10-CM | POA: Diagnosis not present

## 2018-12-13 DIAGNOSIS — Z66 Do not resuscitate: Secondary | ICD-10-CM | POA: Diagnosis present

## 2018-12-13 DIAGNOSIS — I482 Chronic atrial fibrillation, unspecified: Secondary | ICD-10-CM | POA: Diagnosis present

## 2018-12-13 DIAGNOSIS — I214 Non-ST elevation (NSTEMI) myocardial infarction: Secondary | ICD-10-CM | POA: Diagnosis not present

## 2018-12-13 DIAGNOSIS — Z96653 Presence of artificial knee joint, bilateral: Secondary | ICD-10-CM | POA: Diagnosis not present

## 2018-12-13 DIAGNOSIS — I5042 Chronic combined systolic (congestive) and diastolic (congestive) heart failure: Secondary | ICD-10-CM | POA: Diagnosis not present

## 2018-12-13 DIAGNOSIS — I480 Paroxysmal atrial fibrillation: Secondary | ICD-10-CM | POA: Diagnosis not present

## 2018-12-13 DIAGNOSIS — K625 Hemorrhage of anus and rectum: Secondary | ICD-10-CM | POA: Diagnosis not present

## 2018-12-13 DIAGNOSIS — Z7901 Long term (current) use of anticoagulants: Secondary | ICD-10-CM | POA: Diagnosis not present

## 2018-12-13 DIAGNOSIS — R6889 Other general symptoms and signs: Secondary | ICD-10-CM | POA: Diagnosis not present

## 2018-12-13 DIAGNOSIS — R05 Cough: Secondary | ICD-10-CM | POA: Diagnosis not present

## 2018-12-13 DIAGNOSIS — M6281 Muscle weakness (generalized): Secondary | ICD-10-CM | POA: Diagnosis not present

## 2018-12-13 DIAGNOSIS — I130 Hypertensive heart and chronic kidney disease with heart failure and stage 1 through stage 4 chronic kidney disease, or unspecified chronic kidney disease: Secondary | ICD-10-CM | POA: Diagnosis present

## 2018-12-13 DIAGNOSIS — I255 Ischemic cardiomyopathy: Secondary | ICD-10-CM | POA: Diagnosis not present

## 2018-12-13 DIAGNOSIS — Z515 Encounter for palliative care: Secondary | ICD-10-CM | POA: Diagnosis not present

## 2018-12-13 DIAGNOSIS — Z955 Presence of coronary angioplasty implant and graft: Secondary | ICD-10-CM | POA: Diagnosis not present

## 2018-12-13 DIAGNOSIS — Z87891 Personal history of nicotine dependence: Secondary | ICD-10-CM | POA: Diagnosis not present

## 2018-12-13 DIAGNOSIS — R0600 Dyspnea, unspecified: Secondary | ICD-10-CM | POA: Diagnosis not present

## 2018-12-13 DIAGNOSIS — K922 Gastrointestinal hemorrhage, unspecified: Secondary | ICD-10-CM | POA: Diagnosis not present

## 2018-12-13 DIAGNOSIS — J449 Chronic obstructive pulmonary disease, unspecified: Secondary | ICD-10-CM | POA: Diagnosis present

## 2018-12-15 DIAGNOSIS — Z7189 Other specified counseling: Secondary | ICD-10-CM | POA: Diagnosis not present

## 2018-12-15 DIAGNOSIS — Z7689 Persons encountering health services in other specified circumstances: Secondary | ICD-10-CM | POA: Diagnosis not present

## 2018-12-15 DIAGNOSIS — M6281 Muscle weakness (generalized): Secondary | ICD-10-CM | POA: Diagnosis not present

## 2018-12-15 DIAGNOSIS — R6889 Other general symptoms and signs: Secondary | ICD-10-CM | POA: Diagnosis not present

## 2018-12-15 DIAGNOSIS — Z515 Encounter for palliative care: Secondary | ICD-10-CM | POA: Diagnosis not present

## 2019-03-15 DIAGNOSIS — E785 Hyperlipidemia, unspecified: Secondary | ICD-10-CM | POA: Diagnosis not present

## 2019-03-15 DIAGNOSIS — R5383 Other fatigue: Secondary | ICD-10-CM | POA: Diagnosis not present

## 2019-03-15 DIAGNOSIS — R600 Localized edema: Secondary | ICD-10-CM | POA: Diagnosis not present

## 2019-03-15 DIAGNOSIS — I480 Paroxysmal atrial fibrillation: Secondary | ICD-10-CM | POA: Diagnosis not present

## 2019-03-15 DIAGNOSIS — R52 Pain, unspecified: Secondary | ICD-10-CM | POA: Diagnosis not present

## 2019-03-15 DIAGNOSIS — J449 Chronic obstructive pulmonary disease, unspecified: Secondary | ICD-10-CM | POA: Diagnosis not present

## 2019-03-15 DIAGNOSIS — I252 Old myocardial infarction: Secondary | ICD-10-CM | POA: Diagnosis not present

## 2019-03-15 DIAGNOSIS — G4089 Other seizures: Secondary | ICD-10-CM | POA: Diagnosis not present

## 2019-03-15 DIAGNOSIS — I5022 Chronic systolic (congestive) heart failure: Secondary | ICD-10-CM | POA: Diagnosis not present

## 2019-03-15 DIAGNOSIS — I2510 Atherosclerotic heart disease of native coronary artery without angina pectoris: Secondary | ICD-10-CM | POA: Diagnosis not present

## 2019-03-15 DIAGNOSIS — Z66 Do not resuscitate: Secondary | ICD-10-CM | POA: Diagnosis not present

## 2019-03-15 DIAGNOSIS — N183 Chronic kidney disease, stage 3 unspecified: Secondary | ICD-10-CM | POA: Diagnosis not present

## 2019-03-15 DIAGNOSIS — K922 Gastrointestinal hemorrhage, unspecified: Secondary | ICD-10-CM | POA: Diagnosis not present

## 2019-03-15 DIAGNOSIS — I130 Hypertensive heart and chronic kidney disease with heart failure and stage 1 through stage 4 chronic kidney disease, or unspecified chronic kidney disease: Secondary | ICD-10-CM | POA: Diagnosis not present

## 2019-03-15 DIAGNOSIS — R0600 Dyspnea, unspecified: Secondary | ICD-10-CM | POA: Diagnosis not present

## 2019-03-15 DIAGNOSIS — R05 Cough: Secondary | ICD-10-CM | POA: Diagnosis not present

## 2019-03-15 DIAGNOSIS — K625 Hemorrhage of anus and rectum: Secondary | ICD-10-CM | POA: Diagnosis not present

## 2019-03-15 DIAGNOSIS — I350 Nonrheumatic aortic (valve) stenosis: Secondary | ICD-10-CM | POA: Diagnosis not present

## 2019-03-15 DIAGNOSIS — K219 Gastro-esophageal reflux disease without esophagitis: Secondary | ICD-10-CM | POA: Diagnosis not present

## 2019-03-15 DIAGNOSIS — Z95810 Presence of automatic (implantable) cardiac defibrillator: Secondary | ICD-10-CM | POA: Diagnosis not present

## 2019-03-15 DIAGNOSIS — Z9981 Dependence on supplemental oxygen: Secondary | ICD-10-CM | POA: Diagnosis not present

## 2019-03-15 DIAGNOSIS — E039 Hypothyroidism, unspecified: Secondary | ICD-10-CM | POA: Diagnosis not present

## 2019-03-15 DIAGNOSIS — I255 Ischemic cardiomyopathy: Secondary | ICD-10-CM | POA: Diagnosis not present

## 2019-03-15 DIAGNOSIS — R1310 Dysphagia, unspecified: Secondary | ICD-10-CM | POA: Diagnosis not present

## 2019-03-15 DIAGNOSIS — Z7901 Long term (current) use of anticoagulants: Secondary | ICD-10-CM | POA: Diagnosis not present

## 2019-03-16 DIAGNOSIS — I2510 Atherosclerotic heart disease of native coronary artery without angina pectoris: Secondary | ICD-10-CM | POA: Diagnosis not present

## 2019-03-16 DIAGNOSIS — Z9981 Dependence on supplemental oxygen: Secondary | ICD-10-CM | POA: Diagnosis not present

## 2019-03-16 DIAGNOSIS — I255 Ischemic cardiomyopathy: Secondary | ICD-10-CM | POA: Diagnosis not present

## 2019-03-16 DIAGNOSIS — I5022 Chronic systolic (congestive) heart failure: Secondary | ICD-10-CM | POA: Diagnosis not present

## 2019-03-16 DIAGNOSIS — I130 Hypertensive heart and chronic kidney disease with heart failure and stage 1 through stage 4 chronic kidney disease, or unspecified chronic kidney disease: Secondary | ICD-10-CM | POA: Diagnosis not present

## 2019-03-16 DIAGNOSIS — R0600 Dyspnea, unspecified: Secondary | ICD-10-CM | POA: Diagnosis not present

## 2019-03-16 IMAGING — CT CT HEAD W/O CM
4 series · 16 of 47 positions shown, 18 images · non-contrast
Comparison: None.

CLINICAL DATA: Acute loss of consciousness. Patient fell this
morning.

EXAM:
CT HEAD WITHOUT CONTRAST
TECHNIQUE: Contiguous axial images were obtained from the base of the skull
through the vertex without intravenous contrast.

[Series 3: head without · axial · non-contrast · 0.45mm/px · z∈[-96,+39]mm · 7 of 37 slices shown, 9 images]
[im 5/37  brain]
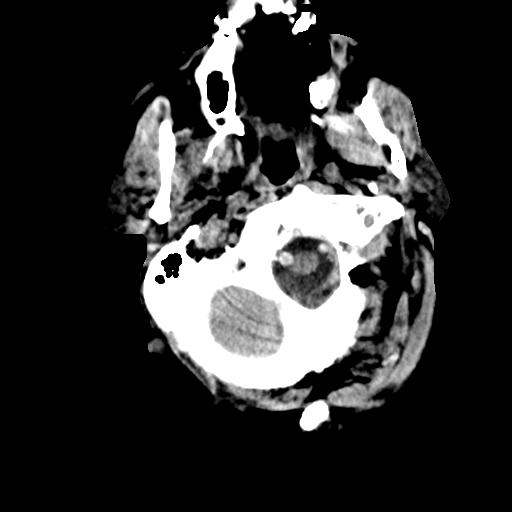
[im 5/37  bone]
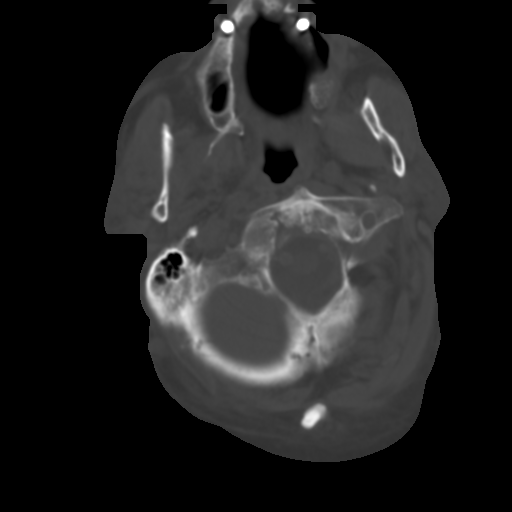
[im 10/37  brain]
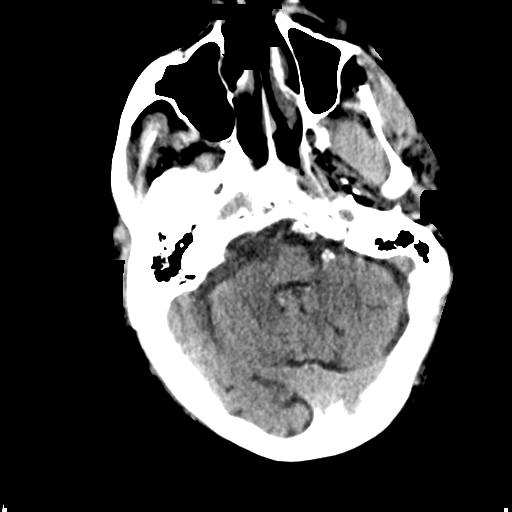
[im 14/37  brain]
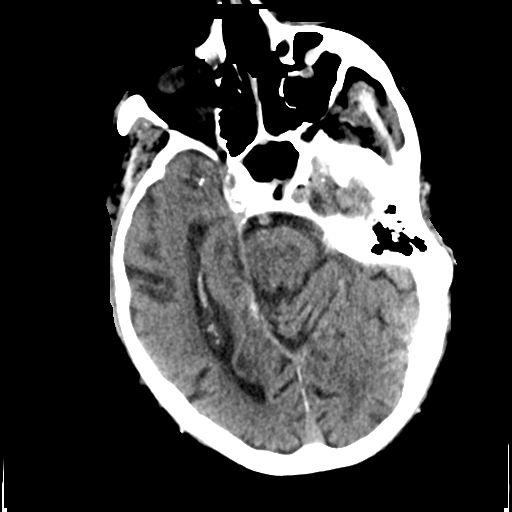
[im 19/37  brain]
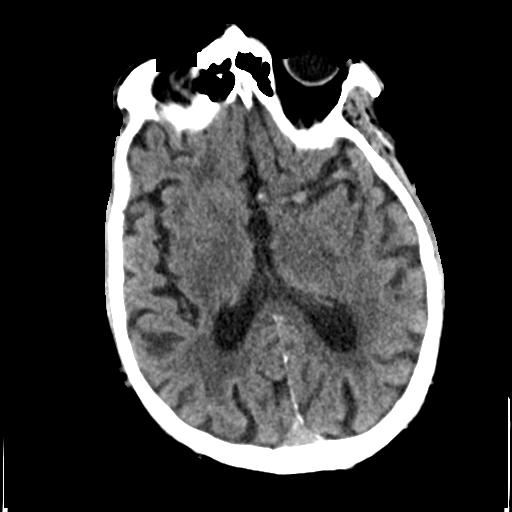
[im 23/37  brain]
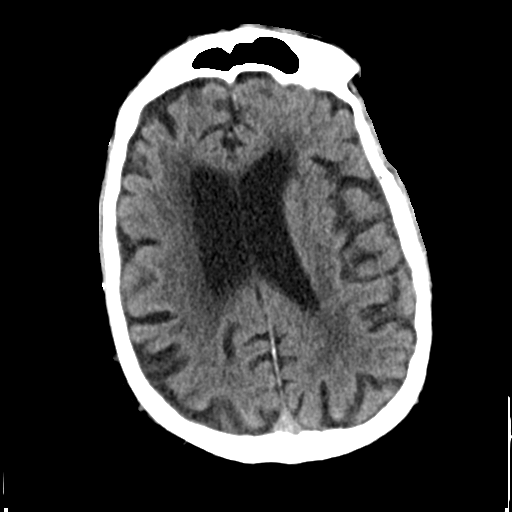
[im 23/37  bone]
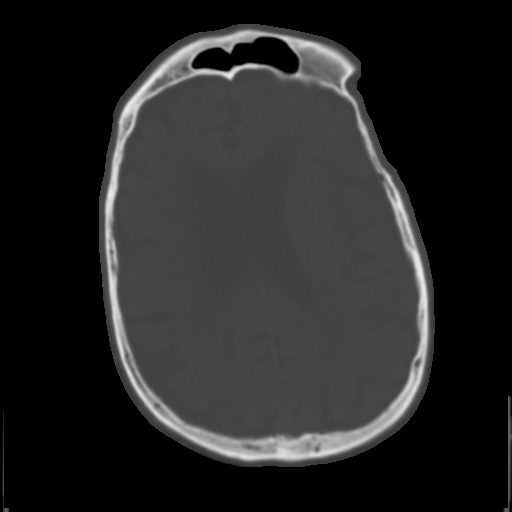
[im 28/37  brain]
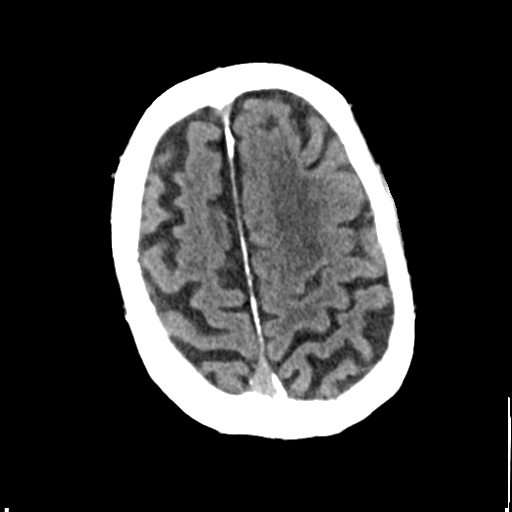
[im 32/37  brain]
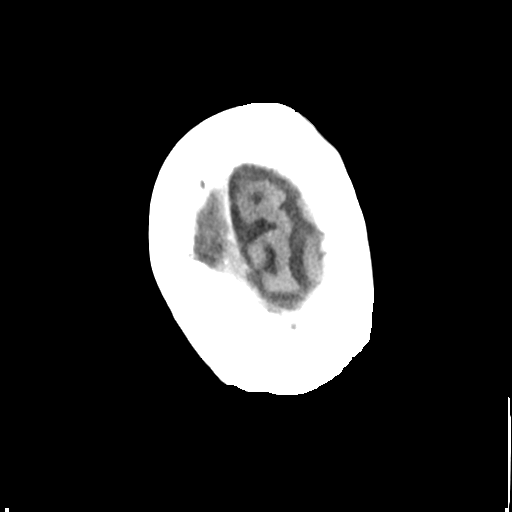

[Series 4: head bone · axial · 0.45mm/px · z∈[-98,-62]mm · 3 of 92 slices shown]
[im 10/92  bone]
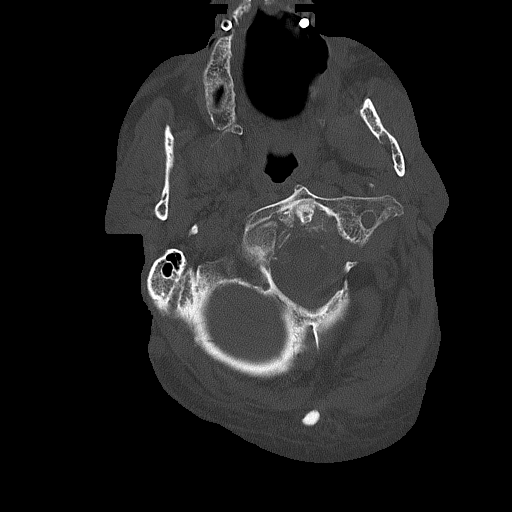
[im 19/92  bone]
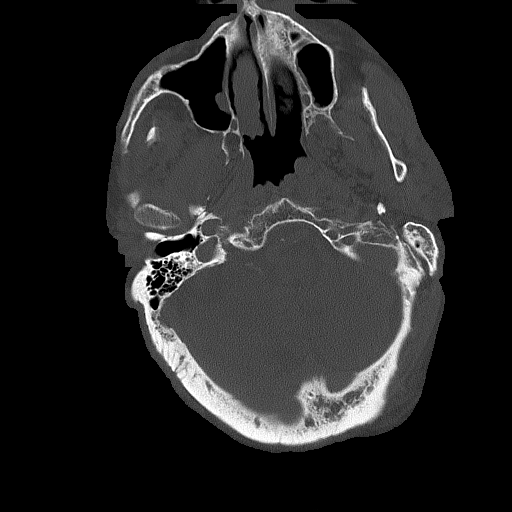
[im 28/92  bone]
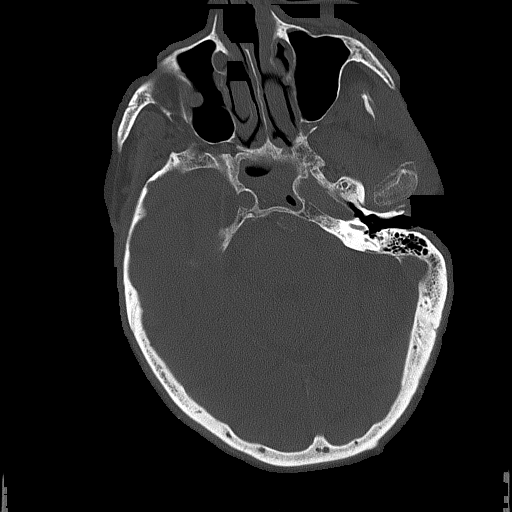

[Series 5: head without cor · coronal · non-contrast · 0.37mm/px · 3 of 76 slices shown]
[im 26/76  brain]
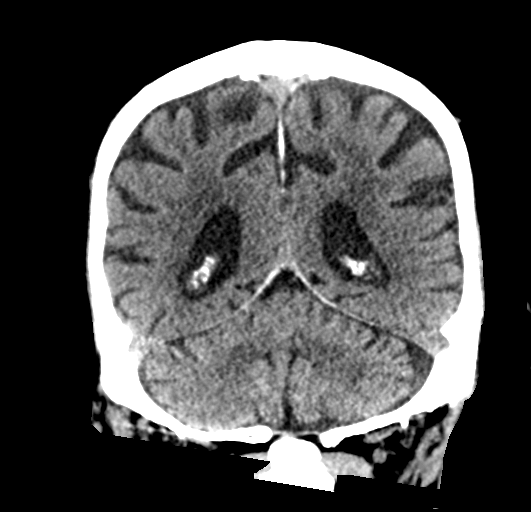
[im 34/76  brain]
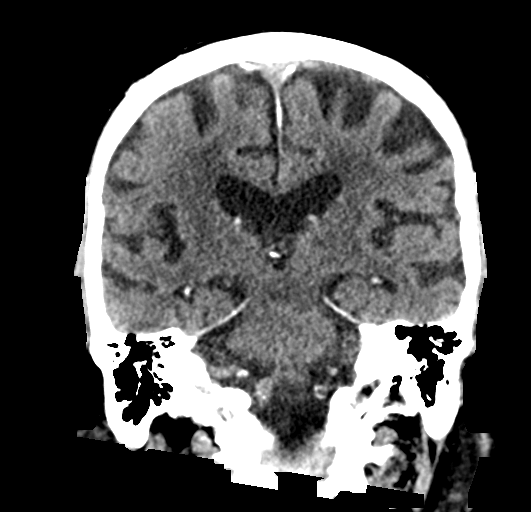
[im 42/76  brain]
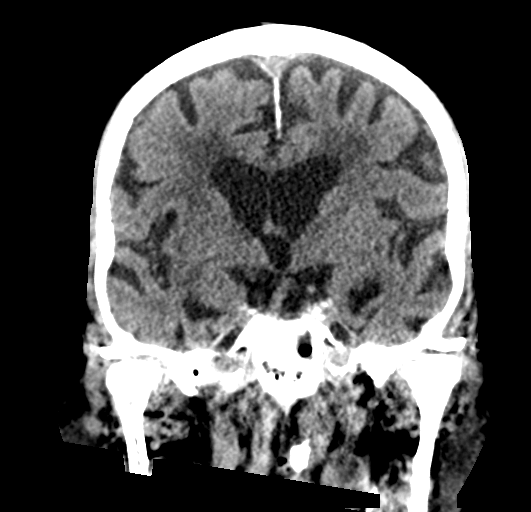

[Series 6: head without sag · sagittal · non-contrast · 0.37mm/px · 3 of 62 slices shown]
[im 25/62  brain]
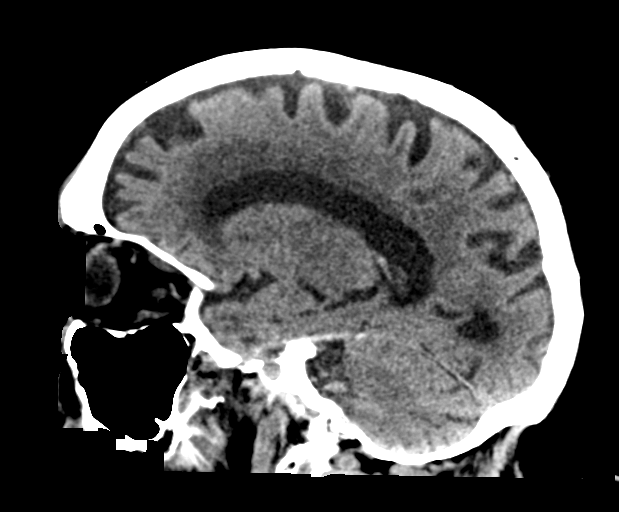
[im 31/62  brain]
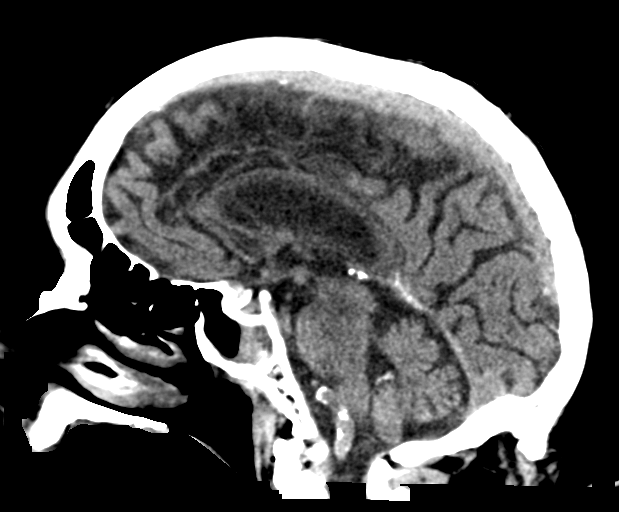
[im 37/62  brain]
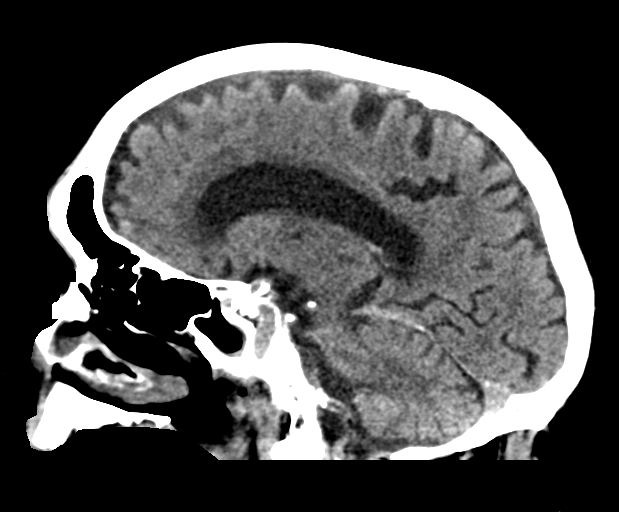

[16 of 47 positions shown; findings below may reference images not displayed]

FINDINGS: Brain: Redemonstration of cerebral atrophy with sulcal and
ventricular prominence. Chronic moderate small vessel ischemic
disease of periventricular and deep white matter. No large vascular
territory infarction. No intra-axial mass nor extra-axial fluid.

Vascular: Stable right MCA bifurcation partially calcified aneurysm
measuring 12.5 x 10.7 mm, series [DATE]. No hyperdense vessel sign.
Moderate atherosclerosis of carotid siphons bilaterally.

Skull: No acute skull fracture or suspicious osseous lesions. Mild
osteopenia at the skull base.

Sinuses/Orbits: Intact orbits and globes with bilateral cataract
extractions. Mild-to-moderate paranasal sinus mucosal thickening,
greatest in the sphenoid sinus with wall thickening of the sphenoid
likely representing stigmata of chronic sinusitis. Small mucous
retention cysts or polyps are noted within the maxillary sinuses
bilaterally.

Other: No significant calvarial soft tissue swelling.
IMPRESSION: 1. No acute intracranial abnormality. Age related atrophy with
moderate small vessel ischemia.
2. Stable right MCA bifurcation aneurysm estimated at 12.5 x 10.7 mm
currently, series April 14, 15. Paranasal sinus mucosal thickening.

## 2019-03-17 DIAGNOSIS — I5022 Chronic systolic (congestive) heart failure: Secondary | ICD-10-CM | POA: Diagnosis not present

## 2019-03-17 DIAGNOSIS — I2510 Atherosclerotic heart disease of native coronary artery without angina pectoris: Secondary | ICD-10-CM | POA: Diagnosis not present

## 2019-03-17 DIAGNOSIS — I255 Ischemic cardiomyopathy: Secondary | ICD-10-CM | POA: Diagnosis not present

## 2019-03-17 DIAGNOSIS — Z9981 Dependence on supplemental oxygen: Secondary | ICD-10-CM | POA: Diagnosis not present

## 2019-03-17 DIAGNOSIS — I130 Hypertensive heart and chronic kidney disease with heart failure and stage 1 through stage 4 chronic kidney disease, or unspecified chronic kidney disease: Secondary | ICD-10-CM | POA: Diagnosis not present

## 2019-03-17 DIAGNOSIS — R0600 Dyspnea, unspecified: Secondary | ICD-10-CM | POA: Diagnosis not present

## 2019-03-19 DIAGNOSIS — I255 Ischemic cardiomyopathy: Secondary | ICD-10-CM | POA: Diagnosis not present

## 2019-03-19 DIAGNOSIS — R0600 Dyspnea, unspecified: Secondary | ICD-10-CM | POA: Diagnosis not present

## 2019-03-19 DIAGNOSIS — Z9981 Dependence on supplemental oxygen: Secondary | ICD-10-CM | POA: Diagnosis not present

## 2019-03-19 DIAGNOSIS — I130 Hypertensive heart and chronic kidney disease with heart failure and stage 1 through stage 4 chronic kidney disease, or unspecified chronic kidney disease: Secondary | ICD-10-CM | POA: Diagnosis not present

## 2019-03-19 DIAGNOSIS — I2510 Atherosclerotic heart disease of native coronary artery without angina pectoris: Secondary | ICD-10-CM | POA: Diagnosis not present

## 2019-03-19 DIAGNOSIS — I5022 Chronic systolic (congestive) heart failure: Secondary | ICD-10-CM | POA: Diagnosis not present

## 2019-03-22 DIAGNOSIS — I255 Ischemic cardiomyopathy: Secondary | ICD-10-CM | POA: Diagnosis not present

## 2019-03-22 DIAGNOSIS — I130 Hypertensive heart and chronic kidney disease with heart failure and stage 1 through stage 4 chronic kidney disease, or unspecified chronic kidney disease: Secondary | ICD-10-CM | POA: Diagnosis not present

## 2019-03-22 DIAGNOSIS — I2510 Atherosclerotic heart disease of native coronary artery without angina pectoris: Secondary | ICD-10-CM | POA: Diagnosis not present

## 2019-03-22 DIAGNOSIS — I5022 Chronic systolic (congestive) heart failure: Secondary | ICD-10-CM | POA: Diagnosis not present

## 2019-03-22 DIAGNOSIS — Z9981 Dependence on supplemental oxygen: Secondary | ICD-10-CM | POA: Diagnosis not present

## 2019-03-22 DIAGNOSIS — R0600 Dyspnea, unspecified: Secondary | ICD-10-CM | POA: Diagnosis not present

## 2019-03-24 DIAGNOSIS — R0600 Dyspnea, unspecified: Secondary | ICD-10-CM | POA: Diagnosis not present

## 2019-03-24 DIAGNOSIS — I130 Hypertensive heart and chronic kidney disease with heart failure and stage 1 through stage 4 chronic kidney disease, or unspecified chronic kidney disease: Secondary | ICD-10-CM | POA: Diagnosis not present

## 2019-03-24 DIAGNOSIS — I255 Ischemic cardiomyopathy: Secondary | ICD-10-CM | POA: Diagnosis not present

## 2019-03-24 DIAGNOSIS — I5022 Chronic systolic (congestive) heart failure: Secondary | ICD-10-CM | POA: Diagnosis not present

## 2019-03-24 DIAGNOSIS — I2510 Atherosclerotic heart disease of native coronary artery without angina pectoris: Secondary | ICD-10-CM | POA: Diagnosis not present

## 2019-03-24 DIAGNOSIS — Z9981 Dependence on supplemental oxygen: Secondary | ICD-10-CM | POA: Diagnosis not present

## 2019-03-25 DIAGNOSIS — I5022 Chronic systolic (congestive) heart failure: Secondary | ICD-10-CM | POA: Diagnosis not present

## 2019-03-25 DIAGNOSIS — Z9981 Dependence on supplemental oxygen: Secondary | ICD-10-CM | POA: Diagnosis not present

## 2019-03-25 DIAGNOSIS — R0600 Dyspnea, unspecified: Secondary | ICD-10-CM | POA: Diagnosis not present

## 2019-03-25 DIAGNOSIS — I255 Ischemic cardiomyopathy: Secondary | ICD-10-CM | POA: Diagnosis not present

## 2019-03-25 DIAGNOSIS — I130 Hypertensive heart and chronic kidney disease with heart failure and stage 1 through stage 4 chronic kidney disease, or unspecified chronic kidney disease: Secondary | ICD-10-CM | POA: Diagnosis not present

## 2019-03-25 DIAGNOSIS — I2510 Atherosclerotic heart disease of native coronary artery without angina pectoris: Secondary | ICD-10-CM | POA: Diagnosis not present

## 2019-03-26 DIAGNOSIS — R0600 Dyspnea, unspecified: Secondary | ICD-10-CM | POA: Diagnosis not present

## 2019-03-26 DIAGNOSIS — I255 Ischemic cardiomyopathy: Secondary | ICD-10-CM | POA: Diagnosis not present

## 2019-03-26 DIAGNOSIS — I130 Hypertensive heart and chronic kidney disease with heart failure and stage 1 through stage 4 chronic kidney disease, or unspecified chronic kidney disease: Secondary | ICD-10-CM | POA: Diagnosis not present

## 2019-03-26 DIAGNOSIS — I5022 Chronic systolic (congestive) heart failure: Secondary | ICD-10-CM | POA: Diagnosis not present

## 2019-03-26 DIAGNOSIS — I2510 Atherosclerotic heart disease of native coronary artery without angina pectoris: Secondary | ICD-10-CM | POA: Diagnosis not present

## 2019-03-26 DIAGNOSIS — Z9981 Dependence on supplemental oxygen: Secondary | ICD-10-CM | POA: Diagnosis not present

## 2019-03-29 DIAGNOSIS — I130 Hypertensive heart and chronic kidney disease with heart failure and stage 1 through stage 4 chronic kidney disease, or unspecified chronic kidney disease: Secondary | ICD-10-CM | POA: Diagnosis not present

## 2019-03-29 DIAGNOSIS — I5022 Chronic systolic (congestive) heart failure: Secondary | ICD-10-CM | POA: Diagnosis not present

## 2019-03-29 DIAGNOSIS — I255 Ischemic cardiomyopathy: Secondary | ICD-10-CM | POA: Diagnosis not present

## 2019-03-29 DIAGNOSIS — Z9981 Dependence on supplemental oxygen: Secondary | ICD-10-CM | POA: Diagnosis not present

## 2019-03-29 DIAGNOSIS — R0600 Dyspnea, unspecified: Secondary | ICD-10-CM | POA: Diagnosis not present

## 2019-03-29 DIAGNOSIS — I2510 Atherosclerotic heart disease of native coronary artery without angina pectoris: Secondary | ICD-10-CM | POA: Diagnosis not present

## 2019-03-30 DIAGNOSIS — Z9981 Dependence on supplemental oxygen: Secondary | ICD-10-CM | POA: Diagnosis not present

## 2019-03-30 DIAGNOSIS — I2510 Atherosclerotic heart disease of native coronary artery without angina pectoris: Secondary | ICD-10-CM | POA: Diagnosis not present

## 2019-03-30 DIAGNOSIS — R0600 Dyspnea, unspecified: Secondary | ICD-10-CM | POA: Diagnosis not present

## 2019-03-30 DIAGNOSIS — I130 Hypertensive heart and chronic kidney disease with heart failure and stage 1 through stage 4 chronic kidney disease, or unspecified chronic kidney disease: Secondary | ICD-10-CM | POA: Diagnosis not present

## 2019-03-30 DIAGNOSIS — I5022 Chronic systolic (congestive) heart failure: Secondary | ICD-10-CM | POA: Diagnosis not present

## 2019-03-30 DIAGNOSIS — I255 Ischemic cardiomyopathy: Secondary | ICD-10-CM | POA: Diagnosis not present

## 2019-03-31 DIAGNOSIS — I130 Hypertensive heart and chronic kidney disease with heart failure and stage 1 through stage 4 chronic kidney disease, or unspecified chronic kidney disease: Secondary | ICD-10-CM | POA: Diagnosis not present

## 2019-03-31 DIAGNOSIS — R0600 Dyspnea, unspecified: Secondary | ICD-10-CM | POA: Diagnosis not present

## 2019-03-31 DIAGNOSIS — I255 Ischemic cardiomyopathy: Secondary | ICD-10-CM | POA: Diagnosis not present

## 2019-03-31 DIAGNOSIS — I2510 Atherosclerotic heart disease of native coronary artery without angina pectoris: Secondary | ICD-10-CM | POA: Diagnosis not present

## 2019-03-31 DIAGNOSIS — I5022 Chronic systolic (congestive) heart failure: Secondary | ICD-10-CM | POA: Diagnosis not present

## 2019-03-31 DIAGNOSIS — Z9981 Dependence on supplemental oxygen: Secondary | ICD-10-CM | POA: Diagnosis not present

## 2019-04-02 DIAGNOSIS — I130 Hypertensive heart and chronic kidney disease with heart failure and stage 1 through stage 4 chronic kidney disease, or unspecified chronic kidney disease: Secondary | ICD-10-CM | POA: Diagnosis not present

## 2019-04-02 DIAGNOSIS — R0600 Dyspnea, unspecified: Secondary | ICD-10-CM | POA: Diagnosis not present

## 2019-04-02 DIAGNOSIS — I5022 Chronic systolic (congestive) heart failure: Secondary | ICD-10-CM | POA: Diagnosis not present

## 2019-04-02 DIAGNOSIS — I255 Ischemic cardiomyopathy: Secondary | ICD-10-CM | POA: Diagnosis not present

## 2019-04-02 DIAGNOSIS — I2510 Atherosclerotic heart disease of native coronary artery without angina pectoris: Secondary | ICD-10-CM | POA: Diagnosis not present

## 2019-04-02 DIAGNOSIS — Z9981 Dependence on supplemental oxygen: Secondary | ICD-10-CM | POA: Diagnosis not present

## 2019-04-05 DIAGNOSIS — R0600 Dyspnea, unspecified: Secondary | ICD-10-CM | POA: Diagnosis not present

## 2019-04-05 DIAGNOSIS — I130 Hypertensive heart and chronic kidney disease with heart failure and stage 1 through stage 4 chronic kidney disease, or unspecified chronic kidney disease: Secondary | ICD-10-CM | POA: Diagnosis not present

## 2019-04-05 DIAGNOSIS — I255 Ischemic cardiomyopathy: Secondary | ICD-10-CM | POA: Diagnosis not present

## 2019-04-05 DIAGNOSIS — I2510 Atherosclerotic heart disease of native coronary artery without angina pectoris: Secondary | ICD-10-CM | POA: Diagnosis not present

## 2019-04-05 DIAGNOSIS — I5022 Chronic systolic (congestive) heart failure: Secondary | ICD-10-CM | POA: Diagnosis not present

## 2019-04-05 DIAGNOSIS — Z9981 Dependence on supplemental oxygen: Secondary | ICD-10-CM | POA: Diagnosis not present

## 2019-04-06 DIAGNOSIS — I2510 Atherosclerotic heart disease of native coronary artery without angina pectoris: Secondary | ICD-10-CM | POA: Diagnosis not present

## 2019-04-06 DIAGNOSIS — Z9981 Dependence on supplemental oxygen: Secondary | ICD-10-CM | POA: Diagnosis not present

## 2019-04-06 DIAGNOSIS — I255 Ischemic cardiomyopathy: Secondary | ICD-10-CM | POA: Diagnosis not present

## 2019-04-06 DIAGNOSIS — R0600 Dyspnea, unspecified: Secondary | ICD-10-CM | POA: Diagnosis not present

## 2019-04-06 DIAGNOSIS — I130 Hypertensive heart and chronic kidney disease with heart failure and stage 1 through stage 4 chronic kidney disease, or unspecified chronic kidney disease: Secondary | ICD-10-CM | POA: Diagnosis not present

## 2019-04-06 DIAGNOSIS — I5022 Chronic systolic (congestive) heart failure: Secondary | ICD-10-CM | POA: Diagnosis not present

## 2019-04-07 DIAGNOSIS — I130 Hypertensive heart and chronic kidney disease with heart failure and stage 1 through stage 4 chronic kidney disease, or unspecified chronic kidney disease: Secondary | ICD-10-CM | POA: Diagnosis not present

## 2019-04-07 DIAGNOSIS — I2510 Atherosclerotic heart disease of native coronary artery without angina pectoris: Secondary | ICD-10-CM | POA: Diagnosis not present

## 2019-04-07 DIAGNOSIS — R0600 Dyspnea, unspecified: Secondary | ICD-10-CM | POA: Diagnosis not present

## 2019-04-07 DIAGNOSIS — Z9981 Dependence on supplemental oxygen: Secondary | ICD-10-CM | POA: Diagnosis not present

## 2019-04-07 DIAGNOSIS — I5022 Chronic systolic (congestive) heart failure: Secondary | ICD-10-CM | POA: Diagnosis not present

## 2019-04-07 DIAGNOSIS — I255 Ischemic cardiomyopathy: Secondary | ICD-10-CM | POA: Diagnosis not present

## 2019-04-09 DIAGNOSIS — Z9981 Dependence on supplemental oxygen: Secondary | ICD-10-CM | POA: Diagnosis not present

## 2019-04-09 DIAGNOSIS — I2510 Atherosclerotic heart disease of native coronary artery without angina pectoris: Secondary | ICD-10-CM | POA: Diagnosis not present

## 2019-04-09 DIAGNOSIS — I255 Ischemic cardiomyopathy: Secondary | ICD-10-CM | POA: Diagnosis not present

## 2019-04-09 DIAGNOSIS — I130 Hypertensive heart and chronic kidney disease with heart failure and stage 1 through stage 4 chronic kidney disease, or unspecified chronic kidney disease: Secondary | ICD-10-CM | POA: Diagnosis not present

## 2019-04-09 DIAGNOSIS — R0600 Dyspnea, unspecified: Secondary | ICD-10-CM | POA: Diagnosis not present

## 2019-04-09 DIAGNOSIS — I5022 Chronic systolic (congestive) heart failure: Secondary | ICD-10-CM | POA: Diagnosis not present

## 2019-04-12 DIAGNOSIS — I480 Paroxysmal atrial fibrillation: Secondary | ICD-10-CM | POA: Diagnosis not present

## 2019-04-12 DIAGNOSIS — G4089 Other seizures: Secondary | ICD-10-CM | POA: Diagnosis not present

## 2019-04-12 DIAGNOSIS — I2510 Atherosclerotic heart disease of native coronary artery without angina pectoris: Secondary | ICD-10-CM | POA: Diagnosis not present

## 2019-04-12 DIAGNOSIS — R52 Pain, unspecified: Secondary | ICD-10-CM | POA: Diagnosis not present

## 2019-04-12 DIAGNOSIS — I5022 Chronic systolic (congestive) heart failure: Secondary | ICD-10-CM | POA: Diagnosis not present

## 2019-04-12 DIAGNOSIS — E039 Hypothyroidism, unspecified: Secondary | ICD-10-CM | POA: Diagnosis not present

## 2019-04-12 DIAGNOSIS — Z9981 Dependence on supplemental oxygen: Secondary | ICD-10-CM | POA: Diagnosis not present

## 2019-04-12 DIAGNOSIS — N183 Chronic kidney disease, stage 3 unspecified: Secondary | ICD-10-CM | POA: Diagnosis not present

## 2019-04-12 DIAGNOSIS — K219 Gastro-esophageal reflux disease without esophagitis: Secondary | ICD-10-CM | POA: Diagnosis not present

## 2019-04-12 DIAGNOSIS — I255 Ischemic cardiomyopathy: Secondary | ICD-10-CM | POA: Diagnosis not present

## 2019-04-12 DIAGNOSIS — R0600 Dyspnea, unspecified: Secondary | ICD-10-CM | POA: Diagnosis not present

## 2019-04-12 DIAGNOSIS — Z95810 Presence of automatic (implantable) cardiac defibrillator: Secondary | ICD-10-CM | POA: Diagnosis not present

## 2019-04-12 DIAGNOSIS — R5383 Other fatigue: Secondary | ICD-10-CM | POA: Diagnosis not present

## 2019-04-12 DIAGNOSIS — K922 Gastrointestinal hemorrhage, unspecified: Secondary | ICD-10-CM | POA: Diagnosis not present

## 2019-04-12 DIAGNOSIS — E785 Hyperlipidemia, unspecified: Secondary | ICD-10-CM | POA: Diagnosis not present

## 2019-04-12 DIAGNOSIS — Z66 Do not resuscitate: Secondary | ICD-10-CM | POA: Diagnosis not present

## 2019-04-12 DIAGNOSIS — R1310 Dysphagia, unspecified: Secondary | ICD-10-CM | POA: Diagnosis not present

## 2019-04-12 DIAGNOSIS — J449 Chronic obstructive pulmonary disease, unspecified: Secondary | ICD-10-CM | POA: Diagnosis not present

## 2019-04-12 DIAGNOSIS — I350 Nonrheumatic aortic (valve) stenosis: Secondary | ICD-10-CM | POA: Diagnosis not present

## 2019-04-12 DIAGNOSIS — R05 Cough: Secondary | ICD-10-CM | POA: Diagnosis not present

## 2019-04-12 DIAGNOSIS — I130 Hypertensive heart and chronic kidney disease with heart failure and stage 1 through stage 4 chronic kidney disease, or unspecified chronic kidney disease: Secondary | ICD-10-CM | POA: Diagnosis not present

## 2019-04-12 DIAGNOSIS — R600 Localized edema: Secondary | ICD-10-CM | POA: Diagnosis not present

## 2019-04-12 DIAGNOSIS — I252 Old myocardial infarction: Secondary | ICD-10-CM | POA: Diagnosis not present

## 2019-04-12 DIAGNOSIS — K625 Hemorrhage of anus and rectum: Secondary | ICD-10-CM | POA: Diagnosis not present

## 2019-04-12 DIAGNOSIS — Z7901 Long term (current) use of anticoagulants: Secondary | ICD-10-CM | POA: Diagnosis not present

## 2019-04-13 DIAGNOSIS — I2510 Atherosclerotic heart disease of native coronary artery without angina pectoris: Secondary | ICD-10-CM | POA: Diagnosis not present

## 2019-04-13 DIAGNOSIS — I5022 Chronic systolic (congestive) heart failure: Secondary | ICD-10-CM | POA: Diagnosis not present

## 2019-04-13 DIAGNOSIS — R0600 Dyspnea, unspecified: Secondary | ICD-10-CM | POA: Diagnosis not present

## 2019-04-13 DIAGNOSIS — I255 Ischemic cardiomyopathy: Secondary | ICD-10-CM | POA: Diagnosis not present

## 2019-04-13 DIAGNOSIS — Z9981 Dependence on supplemental oxygen: Secondary | ICD-10-CM | POA: Diagnosis not present

## 2019-04-13 DIAGNOSIS — I130 Hypertensive heart and chronic kidney disease with heart failure and stage 1 through stage 4 chronic kidney disease, or unspecified chronic kidney disease: Secondary | ICD-10-CM | POA: Diagnosis not present

## 2019-04-14 DIAGNOSIS — R0600 Dyspnea, unspecified: Secondary | ICD-10-CM | POA: Diagnosis not present

## 2019-04-14 DIAGNOSIS — I5022 Chronic systolic (congestive) heart failure: Secondary | ICD-10-CM | POA: Diagnosis not present

## 2019-04-14 DIAGNOSIS — I130 Hypertensive heart and chronic kidney disease with heart failure and stage 1 through stage 4 chronic kidney disease, or unspecified chronic kidney disease: Secondary | ICD-10-CM | POA: Diagnosis not present

## 2019-04-14 DIAGNOSIS — I255 Ischemic cardiomyopathy: Secondary | ICD-10-CM | POA: Diagnosis not present

## 2019-04-14 DIAGNOSIS — I2510 Atherosclerotic heart disease of native coronary artery without angina pectoris: Secondary | ICD-10-CM | POA: Diagnosis not present

## 2019-04-14 DIAGNOSIS — Z9981 Dependence on supplemental oxygen: Secondary | ICD-10-CM | POA: Diagnosis not present

## 2019-04-15 DIAGNOSIS — R0600 Dyspnea, unspecified: Secondary | ICD-10-CM | POA: Diagnosis not present

## 2019-04-15 DIAGNOSIS — Z9981 Dependence on supplemental oxygen: Secondary | ICD-10-CM | POA: Diagnosis not present

## 2019-04-15 DIAGNOSIS — I255 Ischemic cardiomyopathy: Secondary | ICD-10-CM | POA: Diagnosis not present

## 2019-04-15 DIAGNOSIS — I5022 Chronic systolic (congestive) heart failure: Secondary | ICD-10-CM | POA: Diagnosis not present

## 2019-04-15 DIAGNOSIS — I2510 Atherosclerotic heart disease of native coronary artery without angina pectoris: Secondary | ICD-10-CM | POA: Diagnosis not present

## 2019-04-15 DIAGNOSIS — I130 Hypertensive heart and chronic kidney disease with heart failure and stage 1 through stage 4 chronic kidney disease, or unspecified chronic kidney disease: Secondary | ICD-10-CM | POA: Diagnosis not present

## 2019-04-16 DIAGNOSIS — Z9981 Dependence on supplemental oxygen: Secondary | ICD-10-CM | POA: Diagnosis not present

## 2019-04-16 DIAGNOSIS — R0600 Dyspnea, unspecified: Secondary | ICD-10-CM | POA: Diagnosis not present

## 2019-04-16 DIAGNOSIS — I2510 Atherosclerotic heart disease of native coronary artery without angina pectoris: Secondary | ICD-10-CM | POA: Diagnosis not present

## 2019-04-16 DIAGNOSIS — I5022 Chronic systolic (congestive) heart failure: Secondary | ICD-10-CM | POA: Diagnosis not present

## 2019-04-16 DIAGNOSIS — I130 Hypertensive heart and chronic kidney disease with heart failure and stage 1 through stage 4 chronic kidney disease, or unspecified chronic kidney disease: Secondary | ICD-10-CM | POA: Diagnosis not present

## 2019-04-16 DIAGNOSIS — I255 Ischemic cardiomyopathy: Secondary | ICD-10-CM | POA: Diagnosis not present

## 2019-04-17 DIAGNOSIS — I130 Hypertensive heart and chronic kidney disease with heart failure and stage 1 through stage 4 chronic kidney disease, or unspecified chronic kidney disease: Secondary | ICD-10-CM | POA: Diagnosis not present

## 2019-04-17 DIAGNOSIS — I5022 Chronic systolic (congestive) heart failure: Secondary | ICD-10-CM | POA: Diagnosis not present

## 2019-04-17 DIAGNOSIS — Z9981 Dependence on supplemental oxygen: Secondary | ICD-10-CM | POA: Diagnosis not present

## 2019-04-17 DIAGNOSIS — I2510 Atherosclerotic heart disease of native coronary artery without angina pectoris: Secondary | ICD-10-CM | POA: Diagnosis not present

## 2019-04-17 DIAGNOSIS — R0600 Dyspnea, unspecified: Secondary | ICD-10-CM | POA: Diagnosis not present

## 2019-04-17 DIAGNOSIS — I255 Ischemic cardiomyopathy: Secondary | ICD-10-CM | POA: Diagnosis not present

## 2019-05-13 DEATH — deceased
